# Patient Record
Sex: Male | Born: 1950
Health system: Southern US, Community
[De-identification: ages and names within clinical notes are randomized; demographics above are authoritative.]

## PROBLEM LIST (undated history)

## (undated) DIAGNOSIS — D126 Benign neoplasm of colon, unspecified: Secondary | ICD-10-CM

## (undated) DIAGNOSIS — D696 Thrombocytopenia, unspecified: Secondary | ICD-10-CM

## (undated) DIAGNOSIS — I10 Essential (primary) hypertension: Secondary | ICD-10-CM

## (undated) DIAGNOSIS — K219 Gastro-esophageal reflux disease without esophagitis: Secondary | ICD-10-CM

## (undated) DIAGNOSIS — C61 Malignant neoplasm of prostate: Secondary | ICD-10-CM

## (undated) DIAGNOSIS — K649 Unspecified hemorrhoids: Secondary | ICD-10-CM

## (undated) DIAGNOSIS — D649 Anemia, unspecified: Secondary | ICD-10-CM

## (undated) DIAGNOSIS — E039 Hypothyroidism, unspecified: Secondary | ICD-10-CM

## (undated) DIAGNOSIS — N400 Enlarged prostate without lower urinary tract symptoms: Secondary | ICD-10-CM

## (undated) DIAGNOSIS — K760 Fatty (change of) liver, not elsewhere classified: Secondary | ICD-10-CM

## (undated) DIAGNOSIS — E041 Nontoxic single thyroid nodule: Secondary | ICD-10-CM

## (undated) DIAGNOSIS — K589 Irritable bowel syndrome without diarrhea: Secondary | ICD-10-CM

## (undated) DIAGNOSIS — R011 Cardiac murmur, unspecified: Secondary | ICD-10-CM

## (undated) DIAGNOSIS — F32A Depression, unspecified: Secondary | ICD-10-CM

## (undated) DIAGNOSIS — M1990 Unspecified osteoarthritis, unspecified site: Secondary | ICD-10-CM

## (undated) DIAGNOSIS — E785 Hyperlipidemia, unspecified: Secondary | ICD-10-CM

## (undated) DIAGNOSIS — F329 Major depressive disorder, single episode, unspecified: Secondary | ICD-10-CM

## (undated) DIAGNOSIS — I868 Varicose veins of other specified sites: Secondary | ICD-10-CM

## (undated) DIAGNOSIS — F419 Anxiety disorder, unspecified: Secondary | ICD-10-CM

## (undated) HISTORY — DX: Essential (primary) hypertension: I10

## (undated) HISTORY — PX: PROSTATECTOMY: SHX69

## (undated) HISTORY — DX: Unspecified osteoarthritis, unspecified site: M19.90

## (undated) HISTORY — DX: Unspecified hemorrhoids: K64.9

## (undated) HISTORY — DX: Major depressive disorder, single episode, unspecified: F32.9

## (undated) HISTORY — DX: Irritable bowel syndrome, unspecified: K58.9

## (undated) HISTORY — DX: Varicose veins of other specified sites: I86.8

## (undated) HISTORY — DX: Hypothyroidism, unspecified: E03.9

## (undated) HISTORY — PX: COLONOSCOPY: SHX174

## (undated) HISTORY — PX: TONSILLECTOMY: SUR1361

## (undated) HISTORY — DX: Hyperlipidemia, unspecified: E78.5

## (undated) HISTORY — DX: Fatty (change of) liver, not elsewhere classified: K76.0

## (undated) HISTORY — DX: Depression, unspecified: F32.A

## (undated) HISTORY — DX: Anemia, unspecified: D64.9

## (undated) HISTORY — PX: CARDIAC CATHETERIZATION: SHX172

## (undated) HISTORY — DX: Malignant neoplasm of prostate: C61

## (undated) HISTORY — DX: Anxiety disorder, unspecified: F41.9

## (undated) HISTORY — DX: Benign neoplasm of colon, unspecified: D12.6

## (undated) HISTORY — DX: Benign prostatic hyperplasia without lower urinary tract symptoms: N40.0

## (undated) HISTORY — DX: Thrombocytopenia, unspecified: D69.6

---

## 2003-04-26 ENCOUNTER — Encounter: Admission: RE | Admit: 2003-04-26 | Discharge: 2003-04-26 | Payer: Self-pay | Admitting: Internal Medicine

## 2004-02-01 ENCOUNTER — Ambulatory Visit: Payer: Self-pay | Admitting: Gastroenterology

## 2004-02-02 DIAGNOSIS — D126 Benign neoplasm of colon, unspecified: Secondary | ICD-10-CM

## 2004-02-02 HISTORY — DX: Benign neoplasm of colon, unspecified: D12.6

## 2004-02-08 ENCOUNTER — Ambulatory Visit: Payer: Self-pay | Admitting: Gastroenterology

## 2004-02-10 ENCOUNTER — Ambulatory Visit (HOSPITAL_COMMUNITY): Admission: RE | Admit: 2004-02-10 | Discharge: 2004-02-10 | Payer: Self-pay | Admitting: Gastroenterology

## 2004-02-16 ENCOUNTER — Ambulatory Visit: Payer: Self-pay | Admitting: Gastroenterology

## 2004-03-07 ENCOUNTER — Encounter: Admission: RE | Admit: 2004-03-07 | Discharge: 2004-03-07 | Payer: Self-pay | Admitting: Gastroenterology

## 2004-03-26 ENCOUNTER — Ambulatory Visit (HOSPITAL_COMMUNITY): Admission: RE | Admit: 2004-03-26 | Discharge: 2004-03-26 | Payer: Self-pay | Admitting: Gastroenterology

## 2004-03-28 ENCOUNTER — Ambulatory Visit (HOSPITAL_COMMUNITY): Admission: RE | Admit: 2004-03-28 | Discharge: 2004-03-28 | Payer: Self-pay | Admitting: Gastroenterology

## 2004-04-10 ENCOUNTER — Ambulatory Visit: Payer: Self-pay | Admitting: Gastroenterology

## 2006-01-10 ENCOUNTER — Encounter: Admission: RE | Admit: 2006-01-10 | Discharge: 2006-01-10 | Payer: Self-pay | Admitting: Internal Medicine

## 2007-12-01 ENCOUNTER — Ambulatory Visit: Payer: Self-pay | Admitting: Gastroenterology

## 2007-12-10 ENCOUNTER — Ambulatory Visit: Payer: Self-pay | Admitting: Gastroenterology

## 2007-12-10 ENCOUNTER — Encounter: Payer: Self-pay | Admitting: Gastroenterology

## 2007-12-13 ENCOUNTER — Encounter: Payer: Self-pay | Admitting: Gastroenterology

## 2007-12-25 DIAGNOSIS — Z860100 Personal history of colon polyps, unspecified: Secondary | ICD-10-CM | POA: Insufficient documentation

## 2007-12-30 ENCOUNTER — Ambulatory Visit: Payer: Self-pay | Admitting: Gastroenterology

## 2007-12-30 DIAGNOSIS — I868 Varicose veins of other specified sites: Secondary | ICD-10-CM

## 2008-06-01 ENCOUNTER — Encounter: Payer: Self-pay | Admitting: Gastroenterology

## 2008-06-28 ENCOUNTER — Encounter: Payer: Self-pay | Admitting: Gastroenterology

## 2008-07-12 ENCOUNTER — Encounter: Payer: Self-pay | Admitting: Gastroenterology

## 2008-07-13 ENCOUNTER — Ambulatory Visit: Admission: RE | Admit: 2008-07-13 | Discharge: 2008-09-30 | Payer: Self-pay | Admitting: Radiation Oncology

## 2008-10-12 ENCOUNTER — Inpatient Hospital Stay (HOSPITAL_COMMUNITY): Admission: RE | Admit: 2008-10-12 | Discharge: 2008-10-14 | Payer: Self-pay | Admitting: Urology

## 2008-10-12 ENCOUNTER — Encounter (INDEPENDENT_AMBULATORY_CARE_PROVIDER_SITE_OTHER): Payer: Self-pay | Admitting: Urology

## 2009-03-04 DIAGNOSIS — C61 Malignant neoplasm of prostate: Secondary | ICD-10-CM

## 2009-03-04 HISTORY — DX: Malignant neoplasm of prostate: C61

## 2009-11-02 ENCOUNTER — Encounter (INDEPENDENT_AMBULATORY_CARE_PROVIDER_SITE_OTHER): Payer: Self-pay | Admitting: *Deleted

## 2009-11-15 ENCOUNTER — Encounter (INDEPENDENT_AMBULATORY_CARE_PROVIDER_SITE_OTHER): Payer: Self-pay | Admitting: *Deleted

## 2009-12-20 ENCOUNTER — Encounter (INDEPENDENT_AMBULATORY_CARE_PROVIDER_SITE_OTHER): Payer: Self-pay | Admitting: *Deleted

## 2009-12-21 ENCOUNTER — Ambulatory Visit: Payer: Self-pay | Admitting: Gastroenterology

## 2010-01-04 ENCOUNTER — Ambulatory Visit: Payer: Self-pay | Admitting: Gastroenterology

## 2010-01-05 ENCOUNTER — Encounter: Payer: Self-pay | Admitting: Gastroenterology

## 2010-04-03 NOTE — Letter (Signed)
Summary: Pre Visit Letter Revised  [facility]  [address] [place], [place] [address]   Phone: [phone]  Fax: [phone]        11/15/2009 MRN: [id number]   Tyler [name] [address] [place], [place]  [address]                Welcome to the Gastroenterology Division at [facility].    You are scheduled to see a nurse for your pre-procedure visit on December 21, 2009 at 8:30am on the 3rd floor at [facility], [address]  We ask that you try to arrive at our office 15 minutes prior to your appointment time to allow for check-in.  Please take a minute to review the attached form.  If you answer "Yes" to one or more of the questions on the first page, we ask that you call the person listed at your earliest opportunity.  If you answer "No" to all of the questions, please complete the rest of the form and bring it to your appointment.    Your nurse visit will consist of discussing your medical and surgical history, your immediate family medical history, and your medications.   If you are unable to list all of your medications on the form, please bring the medication bottles to your appointment and we will list them.  We will need to be aware of both prescribed and over the counter drugs.  We will need to know exact dosage information as well.    Please be prepared to read and sign documents such as consent forms, a financial agreement, and acknowledgement forms.  If necessary, and with your consent, a friend or relative is welcome to sit-in on the nurse visit with you.  Please bring your insurance card so that we may make a copy of it.  If your insurance requires a referral to see a specialist, please bring your referral form from your primary care physician.  No co-pay is required for this nurse visit.     If you cannot keep your appointment, please call [phone] to cancel or reschedule prior to your appointment date.  This allows [place] the  opportunity to schedule an appointment for another patient in need of care.    Thank you for choosing [facility] for your medical needs.  We appreciate the opportunity to care for you.  Please visit [place] at our website  to learn more about our practice.  Sincerely, The Gastroenterology Division

## 2010-04-03 NOTE — Letter (Signed)
Summary: [facility] Instructions  [facility]  [address] [place], [place] [address]   Phone: [phone]  Fax: [phone]       Tyler [name]    [birth year]    MRN: [id number]        Procedure Day Dorna Bloom:  Lenor Coffin  01/04/10     Arrival Time:  9:30AM     Procedure Time:  10:30AM     Location of Procedure:                    _X _  [facility] (4th Floor)                      PREPARATION FOR COLONOSCOPY WITH MOVIPREP   Starting 5 days prior to your procedure 12/30/09 do not eat nuts, seeds, popcorn, corn, beans, peas,  salads, or any raw vegetables.  Do not take any fiber supplements (e.g. Metamucil, Citrucel, and Benefiber).  THE DAY BEFORE YOUR PROCEDURE         DATE: 01/03/10  DAY: WEDNESDAY  1.  Drink clear liquids the entire day-NO SOLID FOOD  2.  Do not drink anything colored red or purple.  Avoid juices with pulp.  No orange juice.  3.  Drink at least 64 oz. (8 glasses) of fluid/clear liquids during the day to prevent dehydration and help the prep work efficiently.  CLEAR LIQUIDS INCLUDE: Water Jello Ice Popsicles Tea (sugar ok, no milk/cream) Powdered fruit flavored drinks Coffee (sugar ok, no milk/cream) Gatorade Juice: apple, white grape, white cranberry  Lemonade Clear bullion, consomm, broth Carbonated beverages (any kind) Strained chicken noodle soup Hard Candy                             4.  In the morning, mix first dose of MoviPrep solution:    Empty 1 Pouch A and 1 Pouch B into the disposable container    Add lukewarm drinking water to the top line of the container. Mix to dissolve    Refrigerate (mixed solution should be used within 24 hrs)  5.  Begin drinking the prep at 5:00 p.m. The MoviPrep container is divided by 4 marks.   Every 15 minutes drink the solution down to the next mark (approximately 8 oz) until the full liter is complete.   6.  Follow completed prep with 16 oz of clear liquid of your choice  (Nothing red or purple).  Continue to drink clear liquids until bedtime.  7.  Before going to bed, mix second dose of MoviPrep solution:    Empty 1 Pouch A and 1 Pouch B into the disposable container    Add lukewarm drinking water to the top line of the container. Mix to dissolve    Refrigerate  THE DAY OF YOUR PROCEDURE      DATE: 01/04/10  DAY: THURSDAY  Beginning at 5:30AM (5 hours before procedure):         1. Every 15 minutes, drink the solution down to the next mark (approx 8 oz) until the full liter is complete.  2. Follow completed prep with 16 oz. of clear liquid of your choice.    3. You may drink clear liquids until 8:30AM (2 HOURS BEFORE PROCEDURE).   MEDICATION INSTRUCTIONS  Unless otherwise instructed, you should take regular prescription medications with a small sip of water   as early as possible the morning of your  procedure.       OTHER INSTRUCTIONS  You will need a responsible adult at least 62 years of age to accompany you and drive you home.   This person must remain in the waiting room during your procedure.  Wear loose fitting clothing that is easily removed.  Leave jewelry and other valuables at home.  However, you may wish to bring a book to read or  an iPod/MP3 player to listen to music as you wait for your procedure to start.  Remove all body piercing jewelry and leave at home.  Total time from sign-in until discharge is approximately 2-3 hours.  You should go home directly after your procedure and rest.  You can resume normal activities the  day after your procedure.  The day of your procedure you should not:   Drive   Make legal decisions   Operate machinery   Drink alcohol   Return to work  You will receive specific instructions about eating, activities and medications before you leave.    The above instructions have been reviewed and explained to me by   Tyler Almas RN  December 21, 2009 8:51 AM     I fully understand  and can verbalize these instructions _____________________________ Date _________

## 2010-04-03 NOTE — Letter (Signed)
Summary: Patient Notice- Polyp Results  [facility]  [address] [place], [place] [address]   Phone: [phone]  Fax: [phone]        January 05, 2010 MRN: [id number]    NEFTALI ABAIR [address] [place], [place]  [address]    Dear Mr. LIDDY,  I am pleased to inform you that the colon polyp(s) removed during your recent colonoscopy was (were) found to be benign (no cancer detected) upon pathologic examination.  I recommend you have a repeat colonoscopy examination in 3 years to look for recurrent polyps, as having colon polyps increases your risk for having recurrent polyps or even colon cancer in the future.  Should you develop new or worsening symptoms of abdominal pain, bowel habit changes or bleeding from the rectum or bowels, please schedule an evaluation with either your primary care physician or with me.  Continue treatment plan as outlined the day of your exam.  Please call us if you are having persistent problems or have questions about your condition that have not been fully answered at this time.  Sincerely,  Meryl Dare MD [facility]  This letter has been electronically signed by your physician.  Appended Document: Patient Notice- Polyp Results letter mailed

## 2010-04-03 NOTE — Letter (Signed)
Summary: Colonoscopy Letter  [facility]  [address] [place], [place] [address]   Phone: [phone]  Fax: [phone]      November 02, 2009 MRN: [id number]   Tyler [name] [address] [place], [place]  [address]   Dear [name],   According to your medical record, it is time for you to schedule a Colonoscopy. The American Cancer Society recommends this procedure as a method to detect early colon cancer. Patients with a family history of colon cancer, or a personal history of colon polyps or inflammatory bowel disease are at increased risk.  This letter has beeen generated based on the recommendations made at the time of your procedure. If you feel that in your particular situation this may no longer apply, please contact our office.  Please call our office at [phone] to schedule this appointment or to update your records at your earliest convenience.  Thank you for cooperating with [place] to provide you with the very best care possible.   Sincerely,  Judie Petit T. Russella Dar, M.D.  [facility] Gastroenterology Division [phone]

## 2010-04-03 NOTE — Miscellaneous (Signed)
Summary: [facility] Previsit/prep  Clinical Lists Changes  Medications: Added new medication of MOVIPREP 100 GM  SOLR (PEG-KCL-NACL-NASULF-NA ASC-C) As per prep instructions. - Signed Rx of MOVIPREP 100 GM  SOLR (PEG-KCL-NACL-NASULF-NA ASC-C) As per prep instructions.;  #1 x 0;  Signed;  Entered by: Wyona Almas RN;  Authorized by: Meryl Dare MD [facility];  Method used: Electronically to [facility]  [phone]*, [address] [place], [place]  [address], Ph: [phone] or [phone], Fax: [phone] Observations: Added new observation of NKA: T (12/21/2009 8:22)    Prescriptions: MOVIPREP 100 GM  SOLR (PEG-KCL-NACL-NASULF-NA ASC-C) As per prep instructions.  #1 x 0   Entered by:   Wyona Almas RN   Authorized by:   Meryl Dare MD [facility]   Signed by:   Wyona Almas RN on 12/21/2009   Method used:   Electronically to        [facility]  [phone][facility] (retail)       [address] [place], [place]  [address]       Ph: [phone] or [phone]       Fax: [phone]   RxID:   [id number]

## 2010-04-03 NOTE — Procedures (Signed)
Summary: Colonoscopy  Patient: [name] Note: All result statuses are Final unless otherwise noted.  Tests: (1) Colonoscopy (COL)   COL Colonoscopy           DONE     [facility]     [address]     [place], [place]  [address]           COLONOSCOPY PROCEDURE REPORT           PATIENT:  Tyler [name], Tyler [name]  MR#:  [id number]     BIRTHDATE:  [birth year], 59 yrs. old  GENDER:  male     ENDOSCOPIST:  Judie Petit T. Russella Dar, MD, [facility]           PROCEDURE DATE:  01/04/2010     PROCEDURE:  Colonoscopy with biopsy and snare polypectomy     ASA CLASS:  Class II     INDICATIONS:  1) surveillance and high-risk screening  2) history     of adenomatous colon polyps: 02/2004, 12/2007.     MEDICATIONS:   Fentanyl 50 mcg IV, Versed 6 mg IV     DESCRIPTION OF PROCEDURE:   After the risks benefits and     alternatives of the procedure were thoroughly explained, informed     consent was obtained.  Digital rectal exam was performed and     revealed no abnormalities.   The LB PCF-H180AL [id number] endoscope     was introduced through the anus and advanced to the cecum, which     was identified by both the appendix and ileocecal valve, extensive     colonic varices limited the visualization in several areas.  The     quality of the prep was good, using MoviPrep.  The instrument was     then slowly withdrawn as the colon was fully examined.     <<PROCEDUREIMAGES>>     FINDINGS:  There were varices identified. throughout the colon.  A     sessile polyp was found in the descending colon. It was 5 mm in     size. Polyp was snared without cautery. Retrieval was successful.     A sessile polyp was found in the descending colon. It was 4 mm in     size. The polyp was removed using cold biopsy forceps.  A sessile     polyp was found in the sigmoid colon. It was 4 mm in size. The     polyp was removed using cold biopsy forceps.  This was otherwise a     normal examination of the colon.   Retroflexed views in the  rectum     revealed no abnormalities. The time to cecum =  5  minutes. The     scope was then withdrawn (time =  13  min) from the patient and     the procedure completed.           COMPLICATIONS:  None           ENDOSCOPIC IMPRESSION:     1) Varices, throughout the colon     2) 5 mm sessile polyp in the descending colon     3) 4 mm sessile polyp in the descending colon     4) 4 mm sessile polyp in the sigmoid colon           RECOMMENDATIONS:     1) Hold aspirin, aspirin products, and anti-inflamatory     medication for 2 weeks.     2) Await  pathology results     3) Repeat Colonoscopy in 3 years.           Venita Lick. Russella Dar, MD, [facility]           CC: Chilton Greathouse, MD           n.     Rosalie DoctorVenita Lick. Stark at 01/04/2010 11:02 AM           Jerelene Redden, [phone]  Note: An exclamation mark (!) indicates a result that was not dispersed into the flowsheet. Document Creation Date: 01/04/2010 11:03 AM _______________________________________________________________________  (1) Order result status: Final Collection or observation date-time: 01/04/2010 10:55 Requested date-time:  Receipt date-time:  Reported date-time:  Referring Physician:   Ordering Physician: Claudette Head [phone]) Specimen Source:  Source: Launa Grill Order Number: [id number] Lab site:   Appended Document: Colonoscopy     Procedures Next Due Date:    Colonoscopy: 01/2013

## 2010-06-09 LAB — CBC
HCT: 29.9 % — ABNORMAL LOW (ref 39.0–52.0)
HCT: 31.7 % — ABNORMAL LOW (ref 39.0–52.0)
HCT: 39.7 % (ref 39.0–52.0)
Hemoglobin: 10.4 g/dL — ABNORMAL LOW (ref 13.0–17.0)
Hemoglobin: 13.1 g/dL (ref 13.0–17.0)
Hemoglobin: 9.9 g/dL — ABNORMAL LOW (ref 13.0–17.0)
MCHC: 32.9 g/dL (ref 30.0–36.0)
MCHC: 33 g/dL (ref 30.0–36.0)
MCHC: 33 g/dL (ref 30.0–36.0)
MCV: 89 fL (ref 78.0–100.0)
MCV: 89.8 fL (ref 78.0–100.0)
MCV: 90.5 fL (ref 78.0–100.0)
Platelets: 114 10*3/uL — ABNORMAL LOW (ref 150–400)
Platelets: 115 10*3/uL — ABNORMAL LOW (ref 150–400)
Platelets: 92 10*3/uL — ABNORMAL LOW (ref 150–400)
RBC: 3.33 MIL/uL — ABNORMAL LOW (ref 4.22–5.81)
RBC: 3.5 MIL/uL — ABNORMAL LOW (ref 4.22–5.81)
RBC: 4.45 MIL/uL (ref 4.22–5.81)
RDW: 13.3 % (ref 11.5–15.5)
RDW: 13.6 % (ref 11.5–15.5)
RDW: 13.6 % (ref 11.5–15.5)
WBC: 4.2 10*3/uL (ref 4.0–10.5)
WBC: 4.9 10*3/uL (ref 4.0–10.5)
WBC: 7.1 10*3/uL (ref 4.0–10.5)

## 2010-06-09 LAB — BASIC METABOLIC PANEL
BUN: 10 mg/dL (ref 6–23)
BUN: 5 mg/dL — ABNORMAL LOW (ref 6–23)
CO2: 28 mEq/L (ref 19–32)
CO2: 31 mEq/L (ref 19–32)
Calcium: 8.3 mg/dL — ABNORMAL LOW (ref 8.4–10.5)
Calcium: 9.3 mg/dL (ref 8.4–10.5)
Chloride: 105 mEq/L (ref 96–112)
Chloride: 110 mEq/L (ref 96–112)
Creatinine, Ser: 0.75 mg/dL (ref 0.4–1.5)
Creatinine, Ser: 0.85 mg/dL (ref 0.4–1.5)
GFR calc Af Amer: >60 mL/min (ref >60)
GFR calc Af Amer: >60 mL/min (ref >60)
GFR calc non Af Amer: >60 mL/min (ref >60)
GFR calc non Af Amer: >60 mL/min (ref >60)
Glucose, Bld: 64 mg/dL — ABNORMAL LOW (ref 70–99)
Glucose, Bld: 97 mg/dL (ref 70–99)
Potassium: 4.2 mEq/L (ref 3.5–5.1)
Potassium: 4.4 mEq/L (ref 3.5–5.1)
Sodium: 141 mEq/L (ref 135–145)
Sodium: 142 mEq/L (ref 135–145)

## 2010-06-09 LAB — HEMOGLOBIN AND HEMATOCRIT, BLOOD
HCT: 30.8 % — ABNORMAL LOW (ref 39.0–52.0)
HCT: 36.3 % — ABNORMAL LOW (ref 39.0–52.0)
Hemoglobin: 10.2 g/dL — ABNORMAL LOW (ref 13.0–17.0)
Hemoglobin: 11.6 g/dL — ABNORMAL LOW (ref 13.0–17.0)

## 2010-06-09 LAB — ABO/RH: ABO/RH(D): A NEG

## 2010-06-09 LAB — TYPE AND SCREEN
ABO/RH(D): A NEG
Antibody Screen: NEGATIVE

## 2010-07-17 NOTE — Discharge Summary (Signed)
Tyler [name], Tyler [name]                 ACCOUNT NO.:  [id number]   MEDICAL RECORD NO.:  [id number]          PATIENT TYPE:  INP   LOCATION:  1427                         FACILITY:  [facility]   PHYSICIAN:  Valetta Fuller, M.D.  DATE OF BIRTH:  12/26/1950   DATE OF ADMISSION:  10/12/2008  DATE OF DISCHARGE:  10/14/2008                               DISCHARGE SUMMARY   ADMISSION DIAGNOSIS:  Clinical stage T1c adenocarcinoma of the prostate.   DISCHARGE DIAGNOSES:  1. Clinical stage T1c adenocarcinoma of the prostate.  2. Acute versus chronic anemia with low platelet count.   PROCEDURE:  Robotic-assisted laparoscopic radical prostatectomy.   HISTORY AND PHYSICAL:  For full details, please see admission history  and physical.  Briefly, Tyler [name] is a 60 year old gentleman who was  found to have clinically localized adenocarcinoma of the prostate.  After careful consideration regarding management options for treatment,  he elected to proceed with surgical therapy and a robotic assisted  laparoscopic radical prostatectomy.   HOSPITAL COURSE:  On October 12, 2008, he was taken to the operating room  and underwent the above-named procedure in which he tolerated well and  without complications.  Postoperatively, he was able to be transferred  to a regular hospital room following recovery from anesthesia.  He was  able to begin ambulation that evening.  He remained hemodynamically  stable.  His postoperative hematocrit was 36.3.  On the morning of  postoperative day #1, his hematocrit was slightly decreased although  still found to be stable at 30.8.  Because of the variance, his  hematocrit was rechecked later on the afternoon of postoperative day #1  and found to be stable at 31.7.  Suggesting that the variance was  delusional.  He maintained excellent urine output with minimal output  from his pelvic drain; therefore, the pelvic drain was removed on  postoperative day #1.  He was placed on a  clear liquid diet and  continued to ambulate.  His urine was clear and yellow postoperatively  and found to be blood-tinged without clot sediment on postoperative day  #1.  Therefore, his fluids were left in place to help to clear his  urine.  On postoperative day #1, his lab work did show that his platelet  count was 114, although preoperatively it was 115.  Therefore, it was  decided that his Toradol medication would be stopped.  His Foley  catheter was irrigated with normal saline and several small clots were  removed and his urine did dramatically clear.  Although his urine was  beginning to clear, he was complaining of pain; therefore, it was  decided that he would stay for 1 more night for observation and pain  control.  On the morning of postoperative day #2, his pain was well  controlled and he was tolerating his clear liquid diet.  He urine was  beginning to clear and upon discharge was clear and yellow.  His lab  work was re-drawn and his hemoglobin was found to be 9.9 and platelet  count had slightly decreased to 92.  It was decided  that he was ready  for discharge as he met all discharge criteria, although he was to come  back to the office in 4 days to have more blood work done to recheck his  hemoglobin, hematocrit and platelet count.   DISPOSITION:  Home.   DISCHARGE MEDICATIONS:  He was instructed to resume his regular home  medications including ramipril and Cymbalta.  He was instructed to hold  all aspirin, herbal supplements and multivitamins for 7-10 days until  his Foley catheter was removed.  In addition, he was provided a  prescription for Vicodin to use as needed for pain and told to use  Colace as a stool softener.  He was also given a prescription for Cipro  to begin 2 days prior to return appointment for removal of Foley  catheter.   DISCHARGE INSTRUCTIONS:  He was instructed to be ambulatory, but  specifically told to refrain from any heavy lifting,  strenuous activity  or driving.  He was instructed on routine Foley catheter care and told  to gradually advance his diet over the course of the next few days.   FOLLOW UP:  He will follow up in 4 days for lab work to recheck his  hemoglobin, hematocrit and platelet count.  He will also follow up in 7-  10 days with Dr. Isabel Caprice for removal of Foley catheter and skin staples.      Delia Chimes, NP      Valetta Fuller, M.D.  Electronically Signed    MA/MEDQ  D:  10/14/2008  T:  10/14/2008  Job:  [id number]

## 2010-07-17 NOTE — Op Note (Signed)
Tyler [name], Tyler [name]                 ACCOUNT NO.:  [id number]   MEDICAL RECORD NO.:  [id number]          PATIENT TYPE:  INP   LOCATION:  1427                         FACILITY:  [facility]   PHYSICIAN:  Valetta Fuller, M.D.  DATE OF BIRTH:  [birth year]   DATE OF PROCEDURE:  10/13/2008  DATE OF DISCHARGE:                               OPERATIVE REPORT   PREOPERATIVE DIAGNOSIS:  Clinical stage T1C adenocarcinoma of the  prostate.   POSTOPERATIVE DIAGNOSIS:  Clinical stage T1C adenocarcinoma of the  prostate.   PROCEDURE PERFORMED:  Robotic assisted laparoscopic radical retropubic  prostatectomy.   SURGEON:  Valetta Fuller, M.D.   ASSISTANT:  Delia Chimes, nurse practitioner.   ANESTHESIA:  General endotracheal   INDICATIONS:  Tyler [name] is a 60 year old male.  The patient was  diagnosed with adenocarcinoma prostate by Dr. Marcine [name].  He  appeared to have favorable clinical stage T1 C disease.  PSA was in the  4-5 range.  The patient had very low-volume Gleason 6 cancer.  The  patient underwent extensive counseling by Dr. Retta [name] as well as  myself.  After discussion he elects to proceed with a surgical approach.  He appeared to understand the advantages and disadvantages of this  treatment option.  The patient has done preoperative physical therapy.  He presents now for the surgery.  He has completed a mechanical bowel  prep and has received perioperative Unasyn.   TECHNIQUE AND FINDINGS:  The patient was brought to the operating room.  He was placed supine on the operative table with careful padding of all  extremities.  He had successful induction of general anesthesia.  He was  placed in a mid lithotomy position with careful padding.  The patient  was secured to the operative table and then placed in a steep  Trendelenburg position.  He was prepped and draped in the usual manner.  Foley catheter was placed sterilely on the field.   Camera port incision was chosen 18  cm above the pubic symphysis just to  the left of the umbilicus.  Open Hasson technique was utilized.  There  was no evidence of any adhesions and insufflation occurred without  incident.  Additional trocars were placed with direct visual guidance  including 12 mm of 5 mm assist trocars and three 8 mm robotic trocars.  Once all trocars were placed a surgical cart was docked.  The bladder  was filled to aid the dissection of the retropubic space which was done  with blunt dissection and electrocautery scissors.  Superficial fat over  the bladder neck and endopelvic fascia was taken to expose those  structures.  Endopelvic fascia was then incised from base to apex of the  prostate.  Levator musculature was swept off the apex of the prostate  isolating the dorsal venous complex which was then stapled with  excellent hemostasis.  Anterior bladder neck was identified with the aid  of the Foley balloon and then transected with electrocautery scissors.  Indigo carmine was given and we were well away from the ureteral  orifices.  Posterior bladder  neck was then transected and the vas  deferens and seminal vesicles were each individually dissected free with  clips utilized near the tips of the seminal vesicles.  With these  structures held anteriorly, the posterior plane between the prostate and  rectum was developed primarily with blunt dissection.   A bilateral nerve spare was performed.  Superficial fascia over the  prostate was incised anteriorly and swept posterior laterally isolating  a nice neurovascular bundle on each side.  With the neurovascular bundle  isolated, the pedicles of the prostate were taken down with locking  clips bilaterally.  The apex was then transected and after incising some  rectourethralis fibers, the prostate was removed from the pelvis.  Copious irrigation was performed and there was no evidence of any rectal  injury.  We did not feel that pelvic lymph node  dissection was indicated  in this situation.   Reconstruction was performed.  Denonvilliers fascia was reapproximated  from underneath the urethra to underneath the bladder with a Vicryl  suture.  The 6 o'clock urethral stump and 6 o'clock bladder neck were  then reapproximated with a single 2-0 Vicryl suture.  With those  structures in continuity, we then completed the anastomosis with a  double-armed 3-0 Monocryl suture and in a 360 degrees running manner.  New catheter was placed without incident and irrigation showed no  evidence of leakage or extravasation of irrigant.  A pelvic drain was  placed through one of the trocars and secured to the skin in the  retropubic space.  The prostate specimen was placed an Endopouch  retrieval bag.  The 12 mm trocar site was closed with Vicryl suture with  the aid of a suture passer.  All other trocars were taken down with  visual guidance.  All trocar sites were infiltrated with Marcaine and  closed with clips.  The camera port incision was extended to allow for  removal of the specimen and that fascia was then closed with a running  Vicryl suture and clips for the skin.  Catheter drained a clear blue  urine at the completion of the procedure and blood loss was minimal.  The patient appeared to tolerate the procedure well was brought to  recovery room in stable condition.      Valetta Fuller, M.D.  Electronically Signed     DSG/MEDQ  D:  10/13/2008  T:  10/13/2008  Job:  [id number]

## 2012-02-20 ENCOUNTER — Encounter: Payer: Self-pay | Admitting: Gastroenterology

## 2012-03-16 ENCOUNTER — Encounter: Payer: Self-pay | Admitting: Gastroenterology

## 2012-03-16 ENCOUNTER — Ambulatory Visit (INDEPENDENT_AMBULATORY_CARE_PROVIDER_SITE_OTHER): Payer: PRIVATE HEALTH INSURANCE | Admitting: Gastroenterology

## 2012-03-16 VITALS — BP 128/64 | HR 86 | Ht 69.0 in | Wt 172.0 lb

## 2012-03-16 DIAGNOSIS — I868 Varicose veins of other specified sites: Secondary | ICD-10-CM

## 2012-03-16 DIAGNOSIS — R195 Other fecal abnormalities: Secondary | ICD-10-CM

## 2012-03-16 DIAGNOSIS — Z860100 Personal history of colon polyps, unspecified: Secondary | ICD-10-CM

## 2012-03-16 MED ORDER — PEG-KCL-NACL-NASULF-NA ASC-C 100 G PO SOLR: 1.0000 | Freq: Once | ORAL | Status: DC

## 2012-03-16 NOTE — Progress Notes (Signed)
History of Present Illness: This is a 62 year old male with a history of adenomatous colon polyps and colonic varices of unclear etiology. He was recently found to have Hemoccult positive stool. His last colonoscopy was in June 2011. Prior colonoscopy June 2009.  Current Medications, Allergies, Past Medical History, Past Surgical History, Family History and Social History were reviewed in [employer] record.  Physical Exam: General: Well developed , well nourished, no acute distress Head: Normocephalic and atraumatic Eyes:  sclerae anicteric, EOMI Ears: Normal auditory acuity Mouth: No deformity or lesions Lungs: Clear throughout to auscultation Heart: Regular rate and rhythm; no murmurs, rubs or bruits Abdomen: Soft, non tender and non distended. No masses, hepatosplenomegaly or hernias noted. Normal Bowel sounds Rectal: Deferred to colonoscopy Musculoskeletal: Symmetrical with no gross deformities  Pulses:  Normal pulses noted Extremities: No clubbing, cyanosis, edema or deformities noted Neurological: Alert oriented x 4, grossly nonfocal Psychological:  Alert and cooperative. Normal mood and affect  Assessment and Recommendations:  1. Asymptomatic Hemoccult-positive stool in a patient with known colonic varices. Personal history of adenomatous colon polyps. Schedule colonoscopy. The risks, benefits, and alternatives to colonoscopy with possible biopsy and possible polypectomy were discussed with the patient and they consent to proceed. In this patient, routine screening Hemoccults probably have little value given known colonic varices and his ongoing participation in colon polyp surveillance program with colonoscopies every 3 years.

## 2012-03-16 NOTE — Patient Instructions (Addendum)
You have been scheduled for a colonoscopy with propofol. Please follow written instructions given to you at your visit today.  Please pick up your prep kit at the pharmacy within the next 1-3 days. If you use inhalers (even only as needed) or a CPAP machine, please bring them with you on the day of your procedure.  

## 2012-03-20 ENCOUNTER — Ambulatory Visit (AMBULATORY_SURGERY_CENTER): Payer: PRIVATE HEALTH INSURANCE | Admitting: Gastroenterology

## 2012-03-20 ENCOUNTER — Encounter: Payer: Self-pay | Admitting: Gastroenterology

## 2012-03-20 VITALS — BP 116/78 | HR 63 | Temp 98.2°F | Resp 15 | Ht 69.0 in | Wt 172.0 lb

## 2012-03-20 DIAGNOSIS — D126 Benign neoplasm of colon, unspecified: Secondary | ICD-10-CM

## 2012-03-20 DIAGNOSIS — I868 Varicose veins of other specified sites: Secondary | ICD-10-CM

## 2012-03-20 DIAGNOSIS — R195 Other fecal abnormalities: Secondary | ICD-10-CM

## 2012-03-20 DIAGNOSIS — Z86010 Personal history of colonic polyps: Secondary | ICD-10-CM

## 2012-03-20 MED ORDER — SODIUM CHLORIDE 0.9 % IV SOLN: 500.0000 mL | INTRAVENOUS | Status: DC

## 2012-03-20 NOTE — Op Note (Signed)
[facility] [address] [place], [address]   COLONOSCOPY PROCEDURE REPORT PATIENT: Tyler [name], Tyler [name]  MR#: [id number] BIRTHDATE: 02-15-1951 , 61  yrs. old GENDER: Male ENDOSCOPIST: Meryl Dare, MD, [facility] PROCEDURE DATE:  03/20/2012 PROCEDURE:   Colonoscopy with snare polypectomy ASA CLASS:   Class III INDICATIONS:Patient's personal history of adenomatous colon polyps and heme-positive stool. MEDICATIONS: MAC sedation, administered by CRNA and propofol (Diprivan) 260mg  IV DESCRIPTION OF PROCEDURE:   After the risks benefits and alternatives of the procedure were thoroughly explained, informed consent was obtained.  A digital rectal exam revealed no abnormalities of the rectum.   The LB [id number]  endoscope was introduced through the anus and advanced to the cecum, which was identified by both the appendix and ileocecal valve. No adverse events experienced with a tortuous colon.   The quality of the prep was good, using MoviPrep  The instrument was then slowly withdrawn as the colon was fully examined.  COLON FINDINGS: A single polyp measuring 5 mm in size was found in the proximal transverse colon.  A polypectomy was performed with a cold snare.  The resection was complete and the polyp tissue was completely retrieved.   Two polyps measuring 5-6 mm in size were found in the distal transverse colon.  A polypectomy was performed with a cold snare.  The resection was complete and the polyp tissue was completely retrieved.   A pedunculated polyp measuring 9 mm in size was found in the descending colon.  A polypectomy was performed using snare cautery.  The resection was complete and the polyp tissue was completely retrieved.  Slight oozing at the 3 sites with colon snare. Varices throughout colon.   The colon was otherwise normal.  There was no diverticulosis, inflammation, polyps or cancers unless previously stated.  Retroflexed views revealed moderate  internal hemorrhoids. The time to cecum=4 minutes 52 seconds.  Withdrawal time=16 minutes 43 seconds.  The scope was withdrawn and the procedure completed. COMPLICATIONS: There were no complications. ENDOSCOPIC IMPRESSION: 1.   Single polyp measuring 5 mm in the proximal transverse colon; polypectomy performed with a cold snare 2.   Two polyps measuring 5-6 mm in the distal transverse colon; polypectomy performed with a cold snare 3.   Pedunculated polyp measuring 9 mmin the descending colon; polypectomy performed using snare cautery 4.   Varices throughout colon 5.   Moderate internal hemorrhoids  RECOMMENDATIONS: 1.  Hold aspirin, aspirin products, and anti-inflammatory medication and no heavy lifting or stenusous activity for 3 weeks. 2.  Await pathology results 3.  Repeat Colonoscopy in 3 years.  eSigned:  Meryl Dare, MD, [facility] 03/20/2012 9:33 AM   cc: Chilton Greathouse, MD

## 2012-03-20 NOTE — Patient Instructions (Signed)
HOLD ASPIRIN ,ASPIRIN PRODUCTS AND ANTI INFLAMMATORY MEDICATION FOR 3 WEEKS.FEBUARY 4,0981. NO STRENUOUS ACTIVITY OR HEAVY LIFTING FOR 3 WEEKS, FEBRUARY 7,2013.  YOU HAD AN ENDOSCOPIC PROCEDURE TODAY AT THE [facility]: Refer to the procedure report that was given to you for any specific questions about what was found during the examination.  If the procedure report does not answer your questions, please call your gastroenterologist to clarify.  If you requested that your care partner not be given the details of your procedure findings, then the procedure report has been included in a sealed envelope for you to review at your convenience later.  YOU SHOULD EXPECT: Some feelings of bloating in the abdomen. Passage of more gas than usual.  Walking can help get rid of the air that was put into your GI tract during the procedure and reduce the bloating. If you had a lower endoscopy (such as a colonoscopy or flexible sigmoidoscopy) you may notice spotting of blood in your stool or on the toilet paper. If you underwent a bowel prep for your procedure, then you may not have a normal bowel movement for a few days.  DIET: Your first meal following the procedure should be a light meal and then it is ok to progress to your normal diet.  A half-sandwich or bowl of soup is an example of a good first meal.  Heavy or fried foods are harder to digest and may make you feel nauseous or bloated.  Likewise meals heavy in dairy and vegetables can cause extra gas to form and this can also increase the bloating.  Drink plenty of fluids but you should avoid alcoholic beverages for 24 hours.  ACTIVITY: Your care partner should take you home directly after the procedure.  You should plan to take it easy, moving slowly for the rest of the day.  You can resume normal activity the day after the procedure however you should NOT DRIVE or use heavy machinery for 24 hours (because of the sedation medicines used during the  test).    SYMPTOMS TO REPORT IMMEDIATELY: A gastroenterologist can be reached at any hour.  During normal business hours, 8:30 AM to 5:00 PM Monday through Friday, call [phone].  After hours and on weekends, please call the GI answering service at [phone] who will take a message and have the physician on call contact you.   Following lower endoscopy (colonoscopy or flexible sigmoidoscopy):  Excessive amounts of blood in the stool  Significant tenderness or worsening of abdominal pains  Swelling of the abdomen that is new, acute  Fever of 100F or higher   FOLLOW UP: If any biopsies were taken you will be contacted by phone or by letter within the next 1-3 weeks.  Call your gastroenterologist if you have not heard about the biopsies in 3 weeks.  Our staff will call the home number listed on your records the next business day following your procedure to check on you and address any questions or concerns that you may have at that time regarding the information given to you following your procedure. This is a courtesy call and so if there is no answer at the home number and we have not heard from you through the emergency physician on call, we will assume that you have returned to your regular daily activities without incident.  SIGNATURES/CONFIDENTIALITY: You and/or your care partner have signed paperwork which will be entered into your electronic medical record.  These signatures attest to the  fact that that the information above on your After Visit Summary has been reviewed and is understood.  Full responsibility of the confidentiality of this discharge information lies with you and/or your care-partner.

## 2012-03-20 NOTE — Progress Notes (Signed)
Patient did not experience any of the following events: a burn prior to discharge; a fall within the facility; wrong site/side/patient/procedure/implant event; or a hospital transfer or hospital admission upon discharge from the facility. (G8907) Patient did not have preoperative order for IV antibiotic SSI prophylaxis. (G8918)  

## 2012-03-20 NOTE — Progress Notes (Signed)
Called to room to assist during endoscopic procedure.  Patient ID and intended procedure confirmed with present staff. Received instructions for my participation in the procedure from the performing physician.  

## 2012-03-23 ENCOUNTER — Telehealth: Payer: Self-pay | Admitting: *Deleted

## 2012-03-23 NOTE — Telephone Encounter (Signed)
  Follow up Call-  Call back number 03/20/2012  Post procedure Call Back phone  # [phone]  Permission to leave phone message Yes     Patient questions:  Do you have a fever, pain , or abdominal swelling? no Pain Score  0 *  Have you tolerated food without any problems? yes  Have you been able to return to your normal activities? yes  Do you have any questions about your discharge instructions: Diet   no Medications  no Follow up visit  no  Do you have questions or concerns about your Care? no  Actions: * If pain score is 4 or above: No action needed, pain <4.

## 2012-03-25 ENCOUNTER — Encounter: Payer: Self-pay | Admitting: Gastroenterology

## 2014-09-02 ENCOUNTER — Encounter: Payer: Self-pay | Admitting: Gastroenterology

## 2014-12-12 ENCOUNTER — Other Ambulatory Visit: Payer: Self-pay | Admitting: Internal Medicine

## 2014-12-12 DIAGNOSIS — R109 Unspecified abdominal pain: Secondary | ICD-10-CM

## 2014-12-16 ENCOUNTER — Ambulatory Visit
Admission: RE | Admit: 2014-12-16 | Discharge: 2014-12-16 | Disposition: A | Discharge status: Home / Self Care | Payer: Commercial Managed Care - PPO | Source: Ambulatory Visit | Attending: Internal Medicine | Admitting: Internal Medicine

## 2014-12-16 DIAGNOSIS — R109 Unspecified abdominal pain: Secondary | ICD-10-CM

## 2014-12-16 MED ORDER — IOPAMIDOL (ISOVUE-300) INJECTION 61%
100.0000 mL | Freq: Once | INTRAVENOUS | Status: AC | PRN
  Administered 2014-12-16: 100 mL via INTRAVENOUS

## 2015-01-19 ENCOUNTER — Encounter: Payer: Self-pay | Admitting: Gastroenterology

## 2015-01-19 ENCOUNTER — Ambulatory Visit (INDEPENDENT_AMBULATORY_CARE_PROVIDER_SITE_OTHER): Payer: Commercial Managed Care - PPO | Admitting: Gastroenterology

## 2015-01-19 VITALS — BP 100/60 | HR 72 | Ht 69.0 in | Wt 160.0 lb

## 2015-01-19 DIAGNOSIS — Z86010 Personal history of colonic polyps: Secondary | ICD-10-CM

## 2015-01-19 DIAGNOSIS — R1031 Right lower quadrant pain: Secondary | ICD-10-CM | POA: Diagnosis not present

## 2015-01-19 DIAGNOSIS — I868 Varicose veins of other specified sites: Secondary | ICD-10-CM | POA: Diagnosis not present

## 2015-01-19 MED ORDER — NA SULFATE-K SULFATE-MG SULF 17.5-3.13-1.6 GM/177ML PO SOLN: 1.0000 | Freq: Once | ORAL | Status: DC

## 2015-01-19 NOTE — Progress Notes (Signed)
    History of Present Illness: This is a 64 year old male complaining of right lower quadrant pain. He noted the onset of right lower quadrant and right flank pain while running several weeks ago and he needed to stop running and walking. Symptoms gradually improved over the next few weeks without running. He resumed running and the pain returned so he has again discontinued running. Pain does not relate to any digestive function. Pain has now resolved. Patient was evaluated by Dr. Dagmar Hait and I reviewed his office note. Initially the pain did not respond to NSAIDs. He underwent a CT scan of the abdomen/pelvis with results below. Essentially the colon looks the same as compared to 2005 CT scan. He is due for 3 year surveillance colonoscopy for a history of adenomatous colon polyps. He has known idiopathic colonic varices leading to an abnormal appearing thickened colon and ileum on imaging studies. Denies weight loss, constipation, diarrhea, change in stool caliber, melena, hematochezia, nausea, vomiting, dysphagia, reflux symptoms, chest pain.  Abd/pelvic CT 12/16/2014 IMPRESSION: 1. No acute abnormalities involving the abdomen or pelvis. 2. Wall thickening involving a long segment of the distal and terminal ileum, the appendix, the cecum, the ascending colon, the transverse colon and to a lesser degree the descending colon. There is no associated edema/inflammation. On the report of the prior CT from 2005, the wall thickening was felt to be due to varices as demonstrated on endoscopy. Dilated mesenteric veins are not visible on CT. There is less opacification of veins draining the ascending and transverse colon compared with those from the descending colon and sigmoid colon, indicating the previously identified varices may be due to small vessel veno-occlusive disease. Any event, these findings are unchanged since the prior CT. 3. Mild diffuse hepatic stress that diffuse hepatic steatosis without significant  focal hepatic parenchymal abnormality. 4. Nonobstructing 3 mm and smaller calculi in both kidneys. 5. Asymmetric thickening involving the left anterolateral wall of the urinary bladder. Does the patient have hematuria? If not, this may be post radiation scarring.   Current Medications, Allergies, Past Medical History, Past Surgical History, Family History and Social History were reviewed in [employer] record.  Physical Exam: General: Well developed, well nourished, no acute distress Head: Normocephalic and atraumatic Eyes:  sclerae anicteric, EOMI Ears: Normal auditory acuity Mouth: No deformity or lesions Lungs: Clear throughout to auscultation Heart: Regular rate and rhythm; no murmurs, rubs or bruits Abdomen: Soft, non tender and non distended. No masses, hepatosplenomegaly or hernias noted. Normal Bowel sounds Rectal: Deferred to colonoscopy  Musculoskeletal: Symmetrical with no gross deformities  Pulses:  Normal pulses noted Extremities: No clubbing, cyanosis, edema or deformities noted Neurological: Alert oriented x 4, grossly nonfocal Psychological:  Alert and cooperative. Normal mood and affect  Assessment and Recommendations:  1. RLQ pain with musculoskeletal features. Rule out radicular pain related to lumbar spine problems. Rule out other musculoskeletal problem. This pain does not seem to be gastrointestinal in etiology. Further evaluation per Dr. Dagmar Hait.  2. Abnormally thickened ileum and colon on imaging. Unchanged since 2005. Known idiopathic colonic varices.  3. Personal history of adenomatous colon polyps. Schedule colonoscopy. The risks (including bleeding, perforation, infection, missed lesions, medication reactions and possible hospitalization or surgery if complications occur), benefits, and alternatives to colonoscopy with possible biopsy and possible polypectomy were discussed with the patient and they consent to proceed.

## 2015-01-19 NOTE — Patient Instructions (Addendum)
You have been scheduled for a colonoscopy. Please follow written instructions given to you at your visit today.  Please pick up your prep supplies at the pharmacy within the next 1-3 days. If you use inhalers (even only as needed), please bring them with you on the day of your procedure. Your physician has requested that you go to www.startemmi.com and enter the access code given to you at your visit today. This web site gives a general overview about your procedure. However, you should still follow specific instructions given to you by our office regarding your preparation for the procedure.  Thank you for choosing me and [facility].  Pricilla Riffle. Dagoberto Ligas., MD., [facility]  cc: Prince Solian, MD

## 2015-03-13 ENCOUNTER — Encounter: Payer: Commercial Managed Care - PPO | Admitting: Gastroenterology

## 2015-04-13 ENCOUNTER — Encounter: Payer: Self-pay | Admitting: Gastroenterology

## 2015-05-02 ENCOUNTER — Encounter: Payer: Self-pay | Admitting: Gastroenterology

## 2015-05-02 ENCOUNTER — Ambulatory Visit (AMBULATORY_SURGERY_CENTER): Payer: Commercial Managed Care - PPO | Admitting: Gastroenterology

## 2015-05-02 ENCOUNTER — Encounter: Payer: Commercial Managed Care - PPO | Admitting: Gastroenterology

## 2015-05-02 VITALS — BP 137/87 | HR 57 | Temp 97.1°F | Resp 11 | Ht 69.0 in | Wt 160.0 lb

## 2015-05-02 DIAGNOSIS — D123 Benign neoplasm of transverse colon: Secondary | ICD-10-CM | POA: Diagnosis not present

## 2015-05-02 DIAGNOSIS — Z86010 Personal history of colonic polyps: Secondary | ICD-10-CM | POA: Diagnosis present

## 2015-05-02 MED ORDER — SODIUM CHLORIDE 0.9 % IV SOLN: 500.0000 mL | INTRAVENOUS | Status: DC

## 2015-05-02 NOTE — Patient Instructions (Signed)
YOU HAD AN ENDOSCOPIC PROCEDURE TODAY AT [facility]:   Refer to the procedure report that was given to you for any specific questions about what was found during the examination.  If the procedure report does not answer your questions, please call your gastroenterologist to clarify.  If you requested that your care partner not be given the details of your procedure findings, then the procedure report has been included in a sealed envelope for you to review at your convenience later.  YOU SHOULD EXPECT: Some feelings of bloating in the abdomen. Passage of more gas than usual.  Walking can help get rid of the air that was put into your GI tract during the procedure and reduce the bloating. If you had a lower endoscopy (such as a colonoscopy or flexible sigmoidoscopy) you may notice spotting of blood in your stool or on the toilet paper. If you underwent a bowel prep for your procedure, you may not have a normal bowel movement for a few days.  Please Note:  You might notice some irritation and congestion in your nose or some drainage.  This is from the oxygen used during your procedure.  There is no need for concern and it should clear up in a day or so.  SYMPTOMS TO REPORT IMMEDIATELY:   Following lower endoscopy (colonoscopy or flexible sigmoidoscopy):  Excessive amounts of blood in the stool  Significant tenderness or worsening of abdominal pains  Swelling of the abdomen that is new, acute  Fever of 100F or higher    For urgent or emergent issues, a gastroenterologist can be reached at any hour by calling [phone].   DIET: Your first meal following the procedure should be a small meal and then it is ok to progress to your normal diet. Heavy or fried foods are harder to digest and may make you feel nauseous or bloated.  Likewise, meals heavy in dairy and vegetables can increase bloating.  Drink plenty of fluids but you should avoid alcoholic beverages for 24  hours.  ACTIVITY:  You should plan to take it easy for the rest of today and you should NOT DRIVE or use heavy machinery until tomorrow (because of the sedation medicines used during the test).    FOLLOW UP: Our staff will call the number listed on your records the next business day following your procedure to check on you and address any questions or concerns that you may have regarding the information given to you following your procedure. If we do not reach you, we will leave a message.  However, if you are feeling well and you are not experiencing any problems, there is no need to return our call.  We will assume that you have returned to your regular daily activities without incident.  If any biopsies were taken you will be contacted by phone or by letter within the next 1-3 weeks.  Please call us at [phone] if you have not heard about the biopsies in 3 weeks.    SIGNATURES/CONFIDENTIALITY: You and/or your care partner have signed paperwork which will be entered into your electronic medical record.  These signatures attest to the fact that that the information above on your After Visit Summary has been reviewed and is understood.  Full responsibility of the confidentiality of this discharge information lies with you and/or your care-partner.   INFORMATION ON POLYPS AND HEMORRHOIDS GIVEN TO YOU TODAY  HOLD ASPIRIN AND ANTI-INFLAMMATORY PRODUCTS FOR 3 WEEKS  REPEAT COLONOSCOPY IN  3 YEARS  A CARD TO CARRY IN YOUR WALLET GIVEN TO YOU TODAY INDICATING 3 CLIPS PLACED AT POLYP REMOVAL SITES IN TRANSVERSE COLON

## 2015-05-02 NOTE — Progress Notes (Signed)
Report to PACU, RN, vss, BBS= Clear.  

## 2015-05-02 NOTE — Op Note (Signed)
[place]  [address] [place], [address]   COLONOSCOPY PROCEDURE REPORT  PATIENT: Raidyn, Bielinski  MR#: [id number] BIRTHDATE: 1950-06-30 , 88  yrs. old GENDER: male ENDOSCOPIST: Ladene Artist, MD, [facility] REFERRED BY: Prince Solian, M.D. PROCEDURE DATE:  05/02/2015 PROCEDURE:   Colonoscopy, surveillance , Colonoscopy with biopsy, Colonoscopy with snare polypectomy, and Colonoscopy with control of bleeding First Screening Colonoscopy - Avg.  risk and is 50 yrs.  old or older - No.  Prior Negative Screening - Now for repeat screening. N/A  History of Adenoma - Now for follow-up colonoscopy & has been > or = to 3 yrs.  Yes hx of adenoma.  Has been 3 or more years since last colonoscopy.  Polyps removed today? Yes ASA CLASS:   Class II INDICATIONS:Surveillance due to prior colonic neoplasia, PH Colon Adenoma, and an abnormal CT of colon and TI. MEDICATIONS: Monitored anesthesia care and Propofol 320 mg IV  DESCRIPTION OF PROCEDURE:   After the risks benefits and alternatives of the procedure were thoroughly explained, informed consent was obtained.  The digital rectal exam revealed no abnormalities of the rectum.   The LB [id number]-H190 [id number]  endoscope was introduced through the anus and advanced to the terminal ileum which was intubated for a short distance. No adverse events experienced.   The quality of the prep was good.  (Suprep was used) The instrument was then slowly withdrawn as the colon was fully examined. Estimated blood loss is minimal.   COLON FINDINGS: A sessile polyp measuring 5 mm in size was found in the proximal transverse colon.  A polypectomy was performed with cold forceps.  The resection was complete, the polyp tissue was completely retrieved and sent to histology. Oozing from the site occured and 3 cc of 1:10,000 Epinephrine solution injection was given to control bleeding.  Bleeding at the site was controlled using hemoclips.  Two (2)  placements were made.  There was minimal blood loss from maneuver subsiding by end of procedure.   An oozing sessile polyp measuring 18 mm in size was found in the distal transverse colon.  A polypectomy was performed using snare cautery. The resection was complete, the polyp tissue was completely retrieved and sent to histology.  2 cc 1:10,000 Epinephrine solution injection was given to control bleeding. One hemoclip was placed at the site given the diffuse varices.  Bleeding at the site was controlled.   Varices throughout colon and in distal TI.   The examination was otherwise normal.  Retroflexed views revealed internal Grade I hemorrhoids. The time to cecum = 3.2 Withdrawal time = 33.4   The scope was withdrawn and the procedure completed. COMPLICATIONS: There were no immediate complications.  ENDOSCOPIC IMPRESSION: 1.   Sessile polyp in the proximal transverse colon; polypectomy performed with cold forceps; epinephrine injection given to control bleeding; bleeding at the site controlled using hemoclips 2.  Oozing sessile polyp in the distal transverse colon; polypectomy performed using snare cautery; epinephrine injection given to prevent bleeding; prevention of bleeding at the site using a hemoclip 3.   Varices throughout colon and in distal TI 4.   Grade l internal hemorrhoids  RECOMMENDATIONS: 1.  Hold Aspirin and all other NSAIDS for 3 weeks and no strenusous exertion for 1 week 2.  Await pathology results 3.  Repeat Colonoscopy in 3 years.  eSigned:  Ladene Artist, MD, [facility] 05/02/2015 11:23 AM      PATIENT NAME:  Tyler [name], Tyler [name] MR#: [id number]

## 2015-05-02 NOTE — Progress Notes (Signed)
Called to room to assist during endoscopic procedure.  Patient ID and intended procedure confirmed with present staff. Received instructions for my participation in the procedure from the performing physician.  

## 2015-05-03 ENCOUNTER — Telehealth: Payer: Self-pay | Admitting: *Deleted

## 2015-05-03 NOTE — Telephone Encounter (Signed)
No answer left message to call if patient has any questions. [id number]

## 2015-05-08 ENCOUNTER — Encounter: Payer: Self-pay | Admitting: Gastroenterology

## 2015-06-13 ENCOUNTER — Other Ambulatory Visit (HOSPITAL_COMMUNITY): Payer: Self-pay | Admitting: Otolaryngology

## 2015-06-13 DIAGNOSIS — H903 Sensorineural hearing loss, bilateral: Secondary | ICD-10-CM

## 2015-06-22 ENCOUNTER — Ambulatory Visit (HOSPITAL_COMMUNITY)
Admission: RE | Admit: 2015-06-22 | Discharge: 2015-06-22 | Disposition: A | Discharge status: Home / Self Care | Payer: Commercial Managed Care - PPO | Source: Ambulatory Visit | Attending: Otolaryngology | Admitting: Otolaryngology

## 2015-06-22 DIAGNOSIS — Q283 Other malformations of cerebral vessels: Secondary | ICD-10-CM | POA: Diagnosis not present

## 2015-06-22 DIAGNOSIS — R938 Abnormal findings on diagnostic imaging of other specified body structures: Secondary | ICD-10-CM | POA: Diagnosis not present

## 2015-06-22 DIAGNOSIS — H903 Sensorineural hearing loss, bilateral: Secondary | ICD-10-CM | POA: Insufficient documentation

## 2015-06-22 LAB — POCT I-STAT CREATININE: Creatinine, Ser: 0.7 mg/dL (ref 0.61–1.24)

## 2015-06-22 MED ORDER — GADOBENATE DIMEGLUMINE 529 MG/ML IV SOLN
15.0000 mL | Freq: Once | INTRAVENOUS | Status: AC | PRN
  Administered 2015-06-22: 15 mL via INTRAVENOUS

## 2016-01-03 HISTORY — PX: TYMPANOPLASTY: SHX33

## 2016-01-19 ENCOUNTER — Other Ambulatory Visit: Payer: Self-pay | Admitting: Otolaryngology

## 2017-12-09 DIAGNOSIS — F418 Other specified anxiety disorders: Secondary | ICD-10-CM | POA: Diagnosis not present

## 2017-12-10 DIAGNOSIS — I10 Essential (primary) hypertension: Secondary | ICD-10-CM | POA: Diagnosis not present

## 2017-12-10 DIAGNOSIS — Z6825 Body mass index (BMI) 25.0-25.9, adult: Secondary | ICD-10-CM | POA: Diagnosis not present

## 2017-12-10 DIAGNOSIS — D696 Thrombocytopenia, unspecified: Secondary | ICD-10-CM | POA: Diagnosis not present

## 2017-12-10 DIAGNOSIS — K921 Melena: Secondary | ICD-10-CM | POA: Diagnosis not present

## 2017-12-16 DIAGNOSIS — Z1212 Encounter for screening for malignant neoplasm of rectum: Secondary | ICD-10-CM | POA: Diagnosis not present

## 2017-12-30 DIAGNOSIS — F418 Other specified anxiety disorders: Secondary | ICD-10-CM | POA: Diagnosis not present

## 2018-01-12 DIAGNOSIS — Z8546 Personal history of malignant neoplasm of prostate: Secondary | ICD-10-CM | POA: Diagnosis not present

## 2018-01-20 DIAGNOSIS — N5201 Erectile dysfunction due to arterial insufficiency: Secondary | ICD-10-CM | POA: Diagnosis not present

## 2018-01-20 DIAGNOSIS — Z8546 Personal history of malignant neoplasm of prostate: Secondary | ICD-10-CM | POA: Diagnosis not present

## 2018-01-21 DIAGNOSIS — F418 Other specified anxiety disorders: Secondary | ICD-10-CM | POA: Diagnosis not present

## 2018-01-27 DIAGNOSIS — Z1212 Encounter for screening for malignant neoplasm of rectum: Secondary | ICD-10-CM | POA: Diagnosis not present

## 2018-02-03 ENCOUNTER — Ambulatory Visit (INDEPENDENT_AMBULATORY_CARE_PROVIDER_SITE_OTHER): Payer: PPO | Admitting: Gastroenterology

## 2018-02-03 ENCOUNTER — Encounter: Payer: Self-pay | Admitting: Gastroenterology

## 2018-02-03 ENCOUNTER — Encounter (INDEPENDENT_AMBULATORY_CARE_PROVIDER_SITE_OTHER): Payer: Self-pay

## 2018-02-03 VITALS — BP 120/68 | HR 62 | Ht 69.0 in | Wt 173.4 lb

## 2018-02-03 DIAGNOSIS — Z86010 Personal history of colonic polyps: Secondary | ICD-10-CM

## 2018-02-03 DIAGNOSIS — K921 Melena: Secondary | ICD-10-CM | POA: Diagnosis not present

## 2018-02-03 MED ORDER — NA SULFATE-K SULFATE-MG SULF 17.5-3.13-1.6 GM/177ML PO SOLN: 1.0000 | Freq: Once | ORAL | 0 refills | Status: AC

## 2018-02-03 MED ORDER — DICYCLOMINE HCL 10 MG PO CAPS: 10.0000 mg | ORAL_CAPSULE | Freq: Three times a day (TID) | ORAL | 3 refills | Status: DC | PRN

## 2018-02-03 NOTE — Progress Notes (Signed)
    History of Present Illness: This is a 67 year old male who relates occasional small amounts of bright red blood with bowel movements.  Recent stool Hemoccult testing was positive.  He has a history of tubulovillous adenomas and idiopathic colonic varices.  Internal hemorrhoids noted on last colonoscopy as well.  Occasionally has mild constipation.  He has occasional lower abdominal pain that may proceed a bowel movement by several minutes to several hours.  The symptoms occur intermittently.  Not necessarily associated with any change in bowel function.  Denies weight loss,  diarrhea, change in stool caliber, melena, nausea, vomiting, dysphagia, reflux symptoms, chest pain.   Current Medications, Allergies, Past Medical History, Past Surgical History, Family History and Social History were reviewed in [employer] record.  Physical Exam: General: Well developed, well nourished, no acute distress Head: Normocephalic and atraumatic Eyes:  sclerae anicteric, EOMI Ears: Normal auditory acuity Mouth: No deformity or lesions Lungs: Clear throughout to auscultation Heart: Regular rate and rhythm; no murmurs, rubs or bruits Abdomen: Soft, non tender and non distended. No masses, hepatosplenomegaly or hernias noted. Normal Bowel sounds Rectal: Deferred to colonoscopy Musculoskeletal: Symmetrical with no gross deformities  Pulses:  Normal pulses noted Extremities: No clubbing, cyanosis, edema or deformities noted Neurological: Alert oriented x 4, grossly nonfocal Psychological:  Alert and cooperative. Normal mood and affect   Assessment and Recommendations:  1. Hematochezia.  Hemoccult positive stool.  Both are likely secondary to internal hemorrhoids.  Begin Preparation H suppositories PR daily for 7 days and then as needed.  If hemorrhoidal symptoms persist consider hemorrhoidal banding.  Further evaluation with colonoscopy. The risks (including bleeding, perforation,  infection, missed lesions, medication reactions and possible hospitalization or surgery if complications occur), benefits, and alternatives to colonoscopy with possible biopsy and possible polypectomy were discussed with the patient and they consent to proceed.   2. Personal history of tubulovillous adenomatous polyps.  He is due for 3-year interval surveillance colonoscopy in February.  Colonoscopy as above.  3. History of idiopathic colonic varices.  Reevaluate at colonoscopy as above.  4.  Constipation.  Intermittent lower abdominal pain.  Possible IBS as symptoms are relieved with bowel movements.  Increase dietary fiber and daily water intake.  Increase stool softeners to twice daily. Trial of dicyclomine 10 mg 3 times daily as needed.

## 2018-02-03 NOTE — Patient Instructions (Signed)
We have sent the following medications to your pharmacy for you to pick up at your convenience: bentyl to take three times a day as needed for cramping and abdominal pain.  You can use over the counter preparation H suppositories at bedtime x 1 week then as needed for hemorrhoid symptoms.   You have been scheduled for a colonoscopy. Please follow written instructions given to you at your visit today.  Please pick up your prep supplies at the pharmacy within the next 1-3 days. If you use inhalers (even only as needed), please bring them with you on the day of your procedure. Your physician has requested that you go to www.startemmi.com and enter the access code given to you at your visit today. This web site gives a general overview about your procedure. However, you should still follow specific instructions given to you by our office regarding your preparation for the procedure.  Thank you for choosing me and [facility].  Pricilla Riffle. Dagoberto Ligas., MD., [facility]

## 2018-02-17 DIAGNOSIS — F418 Other specified anxiety disorders: Secondary | ICD-10-CM | POA: Diagnosis not present

## 2018-03-02 ENCOUNTER — Encounter: Payer: Self-pay | Admitting: Gastroenterology

## 2018-03-11 ENCOUNTER — Ambulatory Visit (AMBULATORY_SURGERY_CENTER): Payer: PPO | Admitting: Gastroenterology

## 2018-03-11 ENCOUNTER — Encounter: Payer: Self-pay | Admitting: Gastroenterology

## 2018-03-11 VITALS — BP 102/72 | HR 70 | Temp 96.9°F | Resp 15

## 2018-03-11 DIAGNOSIS — Z86010 Personal history of colonic polyps: Secondary | ICD-10-CM | POA: Diagnosis not present

## 2018-03-11 DIAGNOSIS — K649 Unspecified hemorrhoids: Secondary | ICD-10-CM | POA: Diagnosis not present

## 2018-03-11 DIAGNOSIS — K5520 Angiodysplasia of colon without hemorrhage: Secondary | ICD-10-CM | POA: Diagnosis not present

## 2018-03-11 DIAGNOSIS — D123 Benign neoplasm of transverse colon: Secondary | ICD-10-CM | POA: Diagnosis not present

## 2018-03-11 DIAGNOSIS — K921 Melena: Secondary | ICD-10-CM

## 2018-03-11 DIAGNOSIS — D125 Benign neoplasm of sigmoid colon: Secondary | ICD-10-CM | POA: Diagnosis not present

## 2018-03-11 DIAGNOSIS — R195 Other fecal abnormalities: Secondary | ICD-10-CM

## 2018-03-11 MED ORDER — SODIUM CHLORIDE 0.9 % IV SOLN: 500.0000 mL | Freq: Once | INTRAVENOUS | Status: DC

## 2018-03-11 NOTE — Progress Notes (Signed)
To Pacu, VSS. Report to [email or web address] 

## 2018-03-11 NOTE — Progress Notes (Signed)
Pt's states no medical or surgical changes since previsit or office visit. 

## 2018-03-11 NOTE — Op Note (Signed)
[facility] Patient Name: Tyler [name] Procedure Date: 03/11/2018 10:37 AM MRN: [id number] Endoscopist: Ladene Artist , MD Age: 68 Referring MD:  Date of Birth: [birth year] Gender: Male Account #: [id number] Procedure:                Colonoscopy Indications:              Hematochezia, Heme positive stool, History of                            adenomatous colon polyps Medicines:                Monitored Anesthesia Care Procedure:                Pre-Anesthesia Assessment:                           - Prior to the procedure, a History and Physical                            was performed, and patient medications and                            allergies were reviewed. The patient's tolerance of                            previous anesthesia was also reviewed. The risks                            and benefits of the procedure and the sedation                            options and risks were discussed with the patient.                            All questions were answered, and informed consent                            was obtained. Prior Anticoagulants: The patient has                            taken no previous anticoagulant or antiplatelet                            agents. ASA Grade Assessment: II - A patient with                            mild systemic disease. After reviewing the risks                            and benefits, the patient was deemed in                            satisfactory condition to undergo the procedure.  After obtaining informed consent, the colonoscope                            was passed under direct vision. Throughout the                            procedure, the patient's blood pressure, pulse, and                            oxygen saturations were monitored continuously. The                            Colonoscope was introduced through the anus and                            advanced to the the cecum, identified by                           appendiceal orifice and ileocecal valve. The                            ileocecal valve, appendiceal orifice, and rectum                            were photographed. The quality of the bowel                            preparation was good. The patient tolerated the                            procedure well. The colonoscopy was technically                            difficult and complex due to abnormal anatomy,                            diffuse large varices. Scope In: 10:47:18 AM Scope Out: 11:07:09 AM Scope Withdrawal Time: 0 hours 12 minutes 16 seconds  Total Procedure Duration: 0 hours 19 minutes 51 seconds  Findings:                 The perianal and digital rectal examinations were                            normal.                           Two sessile polyps were found in the sigmoid colon                            and transverse colon. The polyps were 7 to 10 mm in                            size. These polyps were removed with a hot snare.  Resection and retrieval were complete.                           Large, non-bleeding pancolonic varices were found.                           Internal hemorrhoids (vs varices) were found during                            retroflexion. The hemorrhoids were medium-sized and                            Grade I (internal hemorrhoids that do not prolapse).                           The exam was otherwise without abnormality on                            direct and retroflexion views. Complications:            No immediate complications. Estimated blood loss:                            None. Estimated Blood Loss:     Estimated blood loss: none. Impression:               - Two 7 to 10 mm polyps in the sigmoid colon and in                            the transverse colon, removed with a hot snare.                            Resected and retrieved.                           - Pancolonic varices.                            - Internal hemorrhoids.                           - The examination was otherwise normal on direct                            and retroflexion views. Recommendation:           - Repeat colonoscopy in 3 years for surveillance.                           - Patient has a contact number available for                            emergencies. The signs and symptoms of potential                            delayed complications were discussed with the  patient. Return to normal activities tomorrow.                            Written discharge instructions were provided to the                            patient.                           - Resume previous diet.                           - Continue present medications.                           - Await pathology results.                           - No aspirin, ibuprofen, naproxen, or other                            non-steroidal anti-inflammatory drugs for 2 weeks                            after polyp removal. Ladene Artist, MD 03/11/2018 11:17:48 AM This report has been signed electronically.

## 2018-03-11 NOTE — Patient Instructions (Signed)
Continue present medications.  Await pathology results. No aspirin, ibuprofen, naproxen, or other non-steriodal anti-inflammatory drugs for 2 weeks. Please read handouts provided.     YOU HAD AN ENDOSCOPIC PROCEDURE TODAY AT [facility]:   Refer to the procedure report that was given to you for any specific questions about what was found during the examination.  If the procedure report does not answer your questions, please call your gastroenterologist to clarify.  If you requested that your care partner not be given the details of your procedure findings, then the procedure report has been included in a sealed envelope for you to review at your convenience later.  YOU SHOULD EXPECT: Some feelings of bloating in the abdomen. Passage of more gas than usual.  Walking can help get rid of the air that was put into your GI tract during the procedure and reduce the bloating. If you had a lower endoscopy (such as a colonoscopy or flexible sigmoidoscopy) you may notice spotting of blood in your stool or on the toilet paper. If you underwent a bowel prep for your procedure, you may not have a normal bowel movement for a few days.  Please Note:  You might notice some irritation and congestion in your nose or some drainage.  This is from the oxygen used during your procedure.  There is no need for concern and it should clear up in a day or so.  SYMPTOMS TO REPORT IMMEDIATELY:   Following lower endoscopy (colonoscopy or flexible sigmoidoscopy):  Excessive amounts of blood in the stool  Significant tenderness or worsening of abdominal pains  Swelling of the abdomen that is new, acute  Fever of 100F or higher   For urgent or emergent issues, a gastroenterologist can be reached at any hour by calling [phone].   DIET:  We do recommend a small meal at first, but then you may proceed to your regular diet.  Drink plenty of fluids but you should avoid alcoholic beverages for 24  hours.  ACTIVITY:  You should plan to take it easy for the rest of today and you should NOT DRIVE or use heavy machinery until tomorrow (because of the sedation medicines used during the test).    FOLLOW UP: Our staff will call the number listed on your records the next business day following your procedure to check on you and address any questions or concerns that you may have regarding the information given to you following your procedure. If we do not reach you, we will leave a message.  However, if you are feeling well and you are not experiencing any problems, there is no need to return our call.  We will assume that you have returned to your regular daily activities without incident.  If any biopsies were taken you will be contacted by phone or by letter within the next 1-3 weeks.  Please call us at [phone] if you have not heard about the biopsies in 3 weeks.    SIGNATURES/CONFIDENTIALITY: You and/or your care partner have signed paperwork which will be entered into your electronic medical record.  These signatures attest to the fact that that the information above on your After Visit Summary has been reviewed and is understood.  Full responsibility of the confidentiality of this discharge information lies with you and/or your care-partner.

## 2018-03-11 NOTE — Progress Notes (Signed)
Called to room to assist during endoscopic procedure.  Patient ID and intended procedure confirmed with present staff. Received instructions for my participation in the procedure from the performing physician.  

## 2018-03-12 ENCOUNTER — Telehealth: Payer: Self-pay

## 2018-03-12 NOTE — Telephone Encounter (Signed)
  Follow up Call-  Call back number 03/11/2018  Post procedure Call Back phone  # [phone]  Permission to leave phone message Yes  Some recent data might be hidden     Patient questions:  Do you have a fever, pain , or abdominal swelling? No. Pain Score  0 *  Have you tolerated food without any problems? Yes.    Have you been able to return to your normal activities? Yes.    Do you have any questions about your discharge instructions: Diet   No. Medications  No. Follow up visit  No.  Do you have questions or concerns about your Care? No.  Actions: * If pain score is 4 or above: No action needed, pain <4.

## 2018-03-18 ENCOUNTER — Encounter: Payer: Self-pay | Admitting: Gastroenterology

## 2018-03-23 DIAGNOSIS — F418 Other specified anxiety disorders: Secondary | ICD-10-CM | POA: Diagnosis not present

## 2018-04-06 DIAGNOSIS — R7301 Impaired fasting glucose: Secondary | ICD-10-CM | POA: Diagnosis not present

## 2018-04-06 DIAGNOSIS — E7849 Other hyperlipidemia: Secondary | ICD-10-CM | POA: Diagnosis not present

## 2018-04-06 DIAGNOSIS — R82998 Other abnormal findings in urine: Secondary | ICD-10-CM | POA: Diagnosis not present

## 2018-04-06 DIAGNOSIS — Z125 Encounter for screening for malignant neoplasm of prostate: Secondary | ICD-10-CM | POA: Diagnosis not present

## 2018-04-06 DIAGNOSIS — I10 Essential (primary) hypertension: Secondary | ICD-10-CM | POA: Diagnosis not present

## 2018-04-13 DIAGNOSIS — F39 Unspecified mood [affective] disorder: Secondary | ICD-10-CM | POA: Diagnosis not present

## 2018-04-13 DIAGNOSIS — R7301 Impaired fasting glucose: Secondary | ICD-10-CM | POA: Diagnosis not present

## 2018-04-13 DIAGNOSIS — Z1331 Encounter for screening for depression: Secondary | ICD-10-CM | POA: Diagnosis not present

## 2018-04-13 DIAGNOSIS — I10 Essential (primary) hypertension: Secondary | ICD-10-CM | POA: Diagnosis not present

## 2018-04-13 DIAGNOSIS — Z6825 Body mass index (BMI) 25.0-25.9, adult: Secondary | ICD-10-CM | POA: Diagnosis not present

## 2018-04-13 DIAGNOSIS — D649 Anemia, unspecified: Secondary | ICD-10-CM | POA: Diagnosis not present

## 2018-04-13 DIAGNOSIS — F418 Other specified anxiety disorders: Secondary | ICD-10-CM | POA: Diagnosis not present

## 2018-04-13 DIAGNOSIS — H903 Sensorineural hearing loss, bilateral: Secondary | ICD-10-CM | POA: Diagnosis not present

## 2018-04-13 DIAGNOSIS — Z23 Encounter for immunization: Secondary | ICD-10-CM | POA: Diagnosis not present

## 2018-04-13 DIAGNOSIS — Z0000 Encounter for general adult medical examination without abnormal findings: Secondary | ICD-10-CM | POA: Diagnosis not present

## 2018-04-13 DIAGNOSIS — E7849 Other hyperlipidemia: Secondary | ICD-10-CM | POA: Diagnosis not present

## 2018-04-13 DIAGNOSIS — D696 Thrombocytopenia, unspecified: Secondary | ICD-10-CM | POA: Diagnosis not present

## 2018-04-13 DIAGNOSIS — C61 Malignant neoplasm of prostate: Secondary | ICD-10-CM | POA: Diagnosis not present

## 2018-04-14 ENCOUNTER — Telehealth: Payer: Self-pay

## 2018-04-14 NOTE — Telephone Encounter (Signed)
-----   Message from Ladene Artist, MD sent at 04/13/2018  5:33 PM EST ----- Dr Dagmar Hait forwarded records showing a new iron deficiency anemia. He had a colonoscopy in 03/2018 and office visit in 02/2018. No EGD in Epic. Please send tTG, IgA and schedule direct EGD for iron deficiency anemia and heme + stool.

## 2018-04-14 NOTE — Telephone Encounter (Signed)
Left message for patient to call back  

## 2018-04-15 LAB — IFOBT (OCCULT BLOOD): IFOBT: NEGATIVE

## 2018-04-15 NOTE — Telephone Encounter (Signed)
Left message for patient to call back  

## 2018-04-15 NOTE — Telephone Encounter (Signed)
Patient notified of the recommendations.  Patient declines to schedule direct.  He would like to come in and discuss with Dr. Fuller Plan prior to scheduling EGD or blood work.  He is scheduled for 05/05/18

## 2018-05-05 ENCOUNTER — Encounter: Payer: Self-pay | Admitting: Gastroenterology

## 2018-05-05 ENCOUNTER — Other Ambulatory Visit (INDEPENDENT_AMBULATORY_CARE_PROVIDER_SITE_OTHER): Payer: PPO

## 2018-05-05 ENCOUNTER — Ambulatory Visit (INDEPENDENT_AMBULATORY_CARE_PROVIDER_SITE_OTHER): Payer: PPO | Admitting: Gastroenterology

## 2018-05-05 VITALS — BP 100/66 | HR 76 | Ht 69.0 in | Wt 175.0 lb

## 2018-05-05 DIAGNOSIS — D509 Iron deficiency anemia, unspecified: Secondary | ICD-10-CM

## 2018-05-05 DIAGNOSIS — K921 Melena: Secondary | ICD-10-CM | POA: Diagnosis not present

## 2018-05-05 LAB — IGA: IGA: 86 mg/dL (ref 68–378)

## 2018-05-05 NOTE — Patient Instructions (Signed)
Your provider has requested that you go to the basement level for lab work before leaving today. Press "B" on the elevator. The lab is located at the first door on the left as you exit the elevator.  You have been scheduled for an endoscopy. Please follow written instructions given to you at your visit today. If you use inhalers (even only as needed), please bring them with you on the day of your procedure. Your physician has requested that you go to www.startemmi.com and enter the access code given to you at your visit today. This web site gives a general overview about your procedure. However, you should still follow specific instructions given to you by our office regarding your preparation for the procedure.  You can hold iron 1 day prior to your Upper Endoscopy.   Thank you for choosing me and [facility].  Pricilla Riffle. Dagoberto Ligas., MD., [facility]

## 2018-05-05 NOTE — Progress Notes (Signed)
    History of Present Illness: This is a 68 year old male with an iron deficiency anemia.  He is accompanied by his wife.  He states he has occasional loose stools and occasional small amounts of bright red blood per rectum with bowel movements.  Recent colonoscopy below.  No other gastrointestinal complaints.  Colonoscopy 03/2018 - Two 7 to 10 mm polyps in the sigmoid colon and in the transverse colon, removed with a hot snare. Resected and retrieved. (tubular adenoma, inflammatory polyp) - Pancolonic varices. - Internal hemorrhoids. - The examination was otherwise normal on direct and retroflexion views.  Current Medications, Allergies, Past Medical History, Past Surgical History, Family History and Social History were reviewed in [employer] record.  Physical Exam: General: Well developed, well nourished, no acute distress Head: Normocephalic and atraumatic Eyes:  sclerae anicteric, EOMI Ears: Normal auditory acuity Mouth: No deformity or lesions Lungs: Clear throughout to auscultation Heart: Regular rate and rhythm; no murmurs, rubs or bruits Abdomen: Soft, non tender and non distended. No masses, hepatosplenomegaly or hernias noted. Normal Bowel sounds Rectal: Not done Musculoskeletal: Symmetrical with no gross deformities  Pulses:  Normal pulses noted Extremities: No clubbing, cyanosis, edema or deformities noted Neurological: Alert oriented x 4, grossly nonfocal Psychological:  Alert and cooperative. Normal mood and affect   Assessment and Recommendations:  1.  Iron deficiency anemia.  Chronic losses from known colonic varices is possible.  Rule out upper GI sources of blood loss and celiac disease. IgA, tTG today.  Schedule EGD. The risks (including bleeding, perforation, infection, missed lesions, medication reactions and possible hospitalization or surgery if complications occur), benefits, and alternatives to endoscopy with possible biopsy and possible  dilation were discussed with the patient and they consent to proceed.   2. History of TVA.  A 3-year interval surveillance colonoscopy is recommended in January 2023.  3. Intermittent small-volume hematochezia from internal hemorrhoids (rectal varices).

## 2018-05-06 LAB — TISSUE TRANSGLUTAMINASE, IGA: (tTG) Ab, IgA: 1 U/mL

## 2018-05-27 ENCOUNTER — Encounter: Payer: PPO | Admitting: Gastroenterology

## 2018-05-30 ENCOUNTER — Other Ambulatory Visit: Payer: Self-pay | Admitting: Gastroenterology

## 2018-07-02 ENCOUNTER — Encounter: Payer: Self-pay | Admitting: *Deleted

## 2018-07-13 ENCOUNTER — Telehealth: Payer: Self-pay | Admitting: *Deleted

## 2018-07-13 ENCOUNTER — Encounter: Payer: Self-pay | Admitting: *Deleted

## 2018-07-13 NOTE — Telephone Encounter (Signed)
Erroneous encounter

## 2018-07-13 NOTE — Telephone Encounter (Signed)
Covid-19 travel screening questions  Have you traveled in the last 14 days? No If yes where?  Do you now or have you had a fever in the last 14 days? No  Do you have any respiratory symptoms of shortness of breath or cough now or in the last 14 days? No  Do you have any family members or close contacts with diagnosed or suspected Covid-19? No       

## 2018-07-14 ENCOUNTER — Other Ambulatory Visit: Payer: Self-pay

## 2018-07-14 ENCOUNTER — Encounter: Payer: Self-pay | Admitting: Gastroenterology

## 2018-07-14 ENCOUNTER — Ambulatory Visit (AMBULATORY_SURGERY_CENTER): Payer: PPO | Admitting: Gastroenterology

## 2018-07-14 VITALS — BP 129/78 | HR 69 | Temp 98.6°F | Resp 9 | Ht 69.0 in | Wt 175.0 lb

## 2018-07-14 DIAGNOSIS — D500 Iron deficiency anemia secondary to blood loss (chronic): Secondary | ICD-10-CM

## 2018-07-14 DIAGNOSIS — D509 Iron deficiency anemia, unspecified: Secondary | ICD-10-CM

## 2018-07-14 DIAGNOSIS — K2970 Gastritis, unspecified, without bleeding: Secondary | ICD-10-CM

## 2018-07-14 DIAGNOSIS — K2981 Duodenitis with bleeding: Secondary | ICD-10-CM | POA: Diagnosis not present

## 2018-07-14 DIAGNOSIS — K319 Disease of stomach and duodenum, unspecified: Secondary | ICD-10-CM

## 2018-07-14 DIAGNOSIS — K2980 Duodenitis without bleeding: Secondary | ICD-10-CM

## 2018-07-14 DIAGNOSIS — K2951 Unspecified chronic gastritis with bleeding: Secondary | ICD-10-CM | POA: Diagnosis not present

## 2018-07-14 DIAGNOSIS — K210 Gastro-esophageal reflux disease with esophagitis: Secondary | ICD-10-CM

## 2018-07-14 DIAGNOSIS — E7800 Pure hypercholesterolemia, unspecified: Secondary | ICD-10-CM | POA: Diagnosis not present

## 2018-07-14 DIAGNOSIS — I10 Essential (primary) hypertension: Secondary | ICD-10-CM | POA: Diagnosis not present

## 2018-07-14 DIAGNOSIS — K625 Hemorrhage of anus and rectum: Secondary | ICD-10-CM

## 2018-07-14 DIAGNOSIS — K449 Diaphragmatic hernia without obstruction or gangrene: Secondary | ICD-10-CM

## 2018-07-14 DIAGNOSIS — K209 Esophagitis, unspecified without bleeding: Secondary | ICD-10-CM

## 2018-07-14 DIAGNOSIS — R195 Other fecal abnormalities: Secondary | ICD-10-CM

## 2018-07-14 MED ORDER — SODIUM CHLORIDE 0.9 % IV SOLN: 500.0000 mL | Freq: Once | INTRAVENOUS | Status: DC

## 2018-07-14 MED ORDER — PANTOPRAZOLE SODIUM 40 MG PO TBEC: 40.0000 mg | DELAYED_RELEASE_TABLET | Freq: Every day | ORAL | 3 refills | Status: DC

## 2018-07-14 NOTE — Progress Notes (Signed)
Report given to PACU, vss 

## 2018-07-14 NOTE — Patient Instructions (Addendum)
YOU HAD AN ENDOSCOPIC PROCEDURE TODAY AT [facility]:   Refer to the procedure report that was given to you for any specific questions about what was found during the examination.  If the procedure report does not answer your questions, please call your gastroenterologist to clarify.  If you requested that your care partner not be given the details of your procedure findings, then the procedure report has been included in a sealed envelope for you to review at your convenience later.  YOU SHOULD EXPECT: Some feelings of bloating in the abdomen. Passage of more gas than usual.  Walking can help get rid of the air that was put into your GI tract during the procedure and reduce the bloating. If you had a lower endoscopy (such as a colonoscopy or flexible sigmoidoscopy) you may notice spotting of blood in your stool or on the toilet paper. If you underwent a bowel prep for your procedure, you may not have a normal bowel movement for a few days.  Please Note:  You might notice some irritation and congestion in your nose or some drainage.  This is from the oxygen used during your procedure.  There is no need for concern and it should clear up in a day or so.  SYMPTOMS TO REPORT IMMEDIATELY:    Following upper endoscopy (EGD)  Vomiting of blood or coffee ground material  New chest pain or pain under the shoulder blades  Painful or persistently difficult swallowing  New shortness of breath  Fever of 100F or higher  Black, tarry-looking stools  For urgent or emergent issues, a gastroenterologist can be reached at any hour by calling [phone].   DIET:  We do recommend a small meal at first, but then you may proceed to your regular diet.  Drink plenty of fluids but you should avoid alcoholic beverages for 24 hours. Read all of the handouts given to you by your recovery room nurse.  Especially, the ones regarding food.  Take your medication as directed.  ACTIVITY:  You should plan  to take it easy for the rest of today and you should NOT DRIVE or use heavy machinery until tomorrow (because of the sedation medicines used during the test).    FOLLOW UP: Our staff will call the number listed on your records the next business day following your procedure to check on you and address any questions or concerns that you may have regarding the information given to you following your procedure. If we do not reach you, we will leave a message.  However, if you are feeling well and you are not experiencing any problems, there is no need to return our call.  We will assume that you have returned to your regular daily activities without incident. We will be calling you two weeks following your procedure to see if you have developed any symptoms of the COVID-19.  If you develop any symptoms before then, please let us know.  If any biopsies were taken you will be contacted by phone or by letter within the next 1-3 weeks.  Please call us at [phone] if you have not heard about the biopsies in 3 weeks.    SIGNATURES/CONFIDENTIALITY: You and/or your care partner have signed paperwork which will be entered into your electronic medical record.  These signatures attest to the fact that that the information above on your After Visit Summary has been reviewed and is understood.  Full responsibility of the confidentiality of this discharge  information lies with you and/or your care-partner. 

## 2018-07-14 NOTE — Op Note (Signed)
[facility] Patient Name: Tyler [name] Procedure Date: 07/14/2018 10:28 AM MRN: [id number] Endoscopist: Ladene Artist , MD Age: 68 Referring MD:  Date of Birth: 14-Oct-1950 Gender: Male Account #: [id number] Procedure:                Upper GI endoscopy Indications:              Iron deficiency anemia Medicines:                Monitored Anesthesia Care Procedure:                Pre-Anesthesia Assessment:                           - Prior to the procedure, a History and Physical                            was performed, and patient medications and                            allergies were reviewed. The patient's tolerance of                            previous anesthesia was also reviewed. The risks                            and benefits of the procedure and the sedation                            options and risks were discussed with the patient.                            All questions were answered, and informed consent                            was obtained. Prior Anticoagulants: The patient has                            taken no previous anticoagulant or antiplatelet                            agents. ASA Grade Assessment: II - A patient with                            mild systemic disease. After reviewing the risks                            and benefits, the patient was deemed in                            satisfactory condition to undergo the procedure.                           After obtaining informed consent, the endoscope was  passed under direct vision. Throughout the                            procedure, the patient's blood pressure, pulse, and                            oxygen saturations were monitored continuously. The                            Endoscope was introduced through the mouth, and                            advanced to the second part of duodenum. The upper                            GI endoscopy was accomplished  without difficulty.                            The patient tolerated the procedure well. Scope In: Scope Out: Findings:                 LA Grade A (one or more mucosal breaks less than 5                            mm, not extending between tops of 2 mucosal folds)                            esophagitis with no bleeding was found at the                            gastroesophageal junction.                           The exam of the esophagus was otherwise normal.                           Patchy mildly erythematous mucosa without bleeding                            was found in the gastric fundus and in the gastric                            body. Biopsies were taken with a cold forceps for                            histology.                           A small hiatal hernia was present.                           The exam of the stomach was otherwise normal.  The duodenal bulb and second portion of the                            duodenum were normal. Biopsies for histology were                            taken with a cold forceps for evaluation of celiac                            disease. Complications:            No immediate complications. Estimated Blood Loss:     Estimated blood loss was minimal. Impression:               - LA Grade A reflux esophagitis.                           - Erythematous mucosa in the gastric fundus and                            gastric body. Biopsied.                           - Small hiatal hernia.                           - Normal duodenal bulb and second portion of the                            duodenum. Biopsied. Recommendation:           - Patient has a contact number available for                            emergencies. The signs and symptoms of potential                            delayed complications were discussed with the                            patient. Return to normal activities tomorrow.                             Written discharge instructions were provided to the                            patient.                           - Resume previous diet.                           - Antireflux measures.                           - Continue present medications.                           -  Await pathology results.                           - Protonix (pantoprazole) 40 mg PO daily today. Ladene Artist, MD 07/14/2018 10:53:12 AM This report has been signed electronically.

## 2018-07-14 NOTE — Progress Notes (Signed)
Called to room to assist during endoscopic procedure.  Patient ID and intended procedure confirmed with present staff. Received instructions for my participation in the procedure from the performing physician.  

## 2018-07-16 ENCOUNTER — Telehealth: Payer: Self-pay | Admitting: *Deleted

## 2018-07-16 ENCOUNTER — Encounter: Payer: Self-pay | Admitting: Gastroenterology

## 2018-07-16 NOTE — Telephone Encounter (Signed)
  Follow up Call-  Call back number 07/14/2018 03/11/2018  Post procedure Call Back phone  # [phone]  Permission to leave phone message Yes Yes  Some recent data might be hidden     Patient questions:  Do you have a fever, pain , or abdominal swelling? No. Pain Score  0 *  Have you tolerated food without any problems? Yes.    Have you been able to return to your normal activities? Yes.    Do you have any questions about your discharge instructions: Diet   No. Medications  No. Follow up visit  No.  Do you have questions or concerns about your Care? No.  Actions: * If pain score is 4 or above: No action needed, pain <4.  1. Have you developed a fever since your procedure? no  2.   Have you had an respiratory symptoms (SOB or cough) since your procedure? no  3.   Have you tested positive for COVID 19 since your procedure no  3.   Have you had any family members/close contacts diagnosed with the COVID 19 since your procedure?  no   If any of these questions are a yes, please inquire if patient has been seen by family doctor and route this note to Joylene John, Therapist, sports.

## 2018-07-29 DIAGNOSIS — L918 Other hypertrophic disorders of the skin: Secondary | ICD-10-CM | POA: Diagnosis not present

## 2018-07-29 DIAGNOSIS — L820 Inflamed seborrheic keratosis: Secondary | ICD-10-CM | POA: Diagnosis not present

## 2018-07-29 DIAGNOSIS — D225 Melanocytic nevi of trunk: Secondary | ICD-10-CM | POA: Diagnosis not present

## 2018-10-13 DIAGNOSIS — E785 Hyperlipidemia, unspecified: Secondary | ICD-10-CM | POA: Diagnosis not present

## 2018-10-13 DIAGNOSIS — D126 Benign neoplasm of colon, unspecified: Secondary | ICD-10-CM | POA: Diagnosis not present

## 2018-10-13 DIAGNOSIS — K210 Gastro-esophageal reflux disease with esophagitis: Secondary | ICD-10-CM | POA: Diagnosis not present

## 2018-10-13 DIAGNOSIS — D649 Anemia, unspecified: Secondary | ICD-10-CM | POA: Diagnosis not present

## 2018-10-13 DIAGNOSIS — C61 Malignant neoplasm of prostate: Secondary | ICD-10-CM | POA: Diagnosis not present

## 2018-10-13 DIAGNOSIS — I10 Essential (primary) hypertension: Secondary | ICD-10-CM | POA: Diagnosis not present

## 2018-10-13 DIAGNOSIS — D696 Thrombocytopenia, unspecified: Secondary | ICD-10-CM | POA: Diagnosis not present

## 2018-10-13 DIAGNOSIS — R7301 Impaired fasting glucose: Secondary | ICD-10-CM | POA: Diagnosis not present

## 2018-10-13 DIAGNOSIS — F39 Unspecified mood [affective] disorder: Secondary | ICD-10-CM | POA: Diagnosis not present

## 2019-01-15 DIAGNOSIS — Z8546 Personal history of malignant neoplasm of prostate: Secondary | ICD-10-CM | POA: Diagnosis not present

## 2019-01-22 DIAGNOSIS — N5201 Erectile dysfunction due to arterial insufficiency: Secondary | ICD-10-CM | POA: Diagnosis not present

## 2019-01-22 DIAGNOSIS — Z8546 Personal history of malignant neoplasm of prostate: Secondary | ICD-10-CM | POA: Diagnosis not present

## 2019-05-11 DIAGNOSIS — E7849 Other hyperlipidemia: Secondary | ICD-10-CM | POA: Diagnosis not present

## 2019-05-11 DIAGNOSIS — Z125 Encounter for screening for malignant neoplasm of prostate: Secondary | ICD-10-CM | POA: Diagnosis not present

## 2019-05-11 DIAGNOSIS — R7301 Impaired fasting glucose: Secondary | ICD-10-CM | POA: Diagnosis not present

## 2019-05-18 DIAGNOSIS — D696 Thrombocytopenia, unspecified: Secondary | ICD-10-CM | POA: Diagnosis not present

## 2019-05-18 DIAGNOSIS — D649 Anemia, unspecified: Secondary | ICD-10-CM | POA: Diagnosis not present

## 2019-05-18 DIAGNOSIS — Z0000 Encounter for general adult medical examination without abnormal findings: Secondary | ICD-10-CM | POA: Diagnosis not present

## 2019-05-18 DIAGNOSIS — D126 Benign neoplasm of colon, unspecified: Secondary | ICD-10-CM | POA: Diagnosis not present

## 2019-05-18 DIAGNOSIS — Z1331 Encounter for screening for depression: Secondary | ICD-10-CM | POA: Diagnosis not present

## 2019-05-18 DIAGNOSIS — M1990 Unspecified osteoarthritis, unspecified site: Secondary | ICD-10-CM | POA: Diagnosis not present

## 2019-05-18 DIAGNOSIS — R7301 Impaired fasting glucose: Secondary | ICD-10-CM | POA: Diagnosis not present

## 2019-05-18 DIAGNOSIS — C61 Malignant neoplasm of prostate: Secondary | ICD-10-CM | POA: Diagnosis not present

## 2019-05-18 DIAGNOSIS — E785 Hyperlipidemia, unspecified: Secondary | ICD-10-CM | POA: Diagnosis not present

## 2019-05-18 DIAGNOSIS — R82998 Other abnormal findings in urine: Secondary | ICD-10-CM | POA: Diagnosis not present

## 2019-05-18 DIAGNOSIS — I10 Essential (primary) hypertension: Secondary | ICD-10-CM | POA: Diagnosis not present

## 2019-05-18 DIAGNOSIS — K2100 Gastro-esophageal reflux disease with esophagitis, without bleeding: Secondary | ICD-10-CM | POA: Diagnosis not present

## 2019-05-18 DIAGNOSIS — F39 Unspecified mood [affective] disorder: Secondary | ICD-10-CM | POA: Diagnosis not present

## 2019-05-28 DIAGNOSIS — Z1212 Encounter for screening for malignant neoplasm of rectum: Secondary | ICD-10-CM | POA: Diagnosis not present

## 2019-08-03 ENCOUNTER — Other Ambulatory Visit: Payer: Self-pay | Admitting: Gastroenterology

## 2019-08-03 DIAGNOSIS — R195 Other fecal abnormalities: Secondary | ICD-10-CM

## 2019-08-03 DIAGNOSIS — K319 Disease of stomach and duodenum, unspecified: Secondary | ICD-10-CM

## 2019-08-03 DIAGNOSIS — D509 Iron deficiency anemia, unspecified: Secondary | ICD-10-CM

## 2019-08-03 DIAGNOSIS — D500 Iron deficiency anemia secondary to blood loss (chronic): Secondary | ICD-10-CM

## 2019-08-03 DIAGNOSIS — K449 Diaphragmatic hernia without obstruction or gangrene: Secondary | ICD-10-CM

## 2019-08-03 DIAGNOSIS — K209 Esophagitis, unspecified without bleeding: Secondary | ICD-10-CM

## 2019-10-24 ENCOUNTER — Other Ambulatory Visit: Payer: Self-pay | Admitting: Gastroenterology

## 2019-10-24 DIAGNOSIS — K319 Disease of stomach and duodenum, unspecified: Secondary | ICD-10-CM

## 2019-10-24 DIAGNOSIS — R195 Other fecal abnormalities: Secondary | ICD-10-CM

## 2019-10-24 DIAGNOSIS — K449 Diaphragmatic hernia without obstruction or gangrene: Secondary | ICD-10-CM

## 2019-10-24 DIAGNOSIS — D500 Iron deficiency anemia secondary to blood loss (chronic): Secondary | ICD-10-CM

## 2019-10-24 DIAGNOSIS — D509 Iron deficiency anemia, unspecified: Secondary | ICD-10-CM

## 2019-10-24 DIAGNOSIS — K209 Esophagitis, unspecified without bleeding: Secondary | ICD-10-CM

## 2019-11-06 ENCOUNTER — Other Ambulatory Visit: Payer: Self-pay | Admitting: Gastroenterology

## 2019-11-06 DIAGNOSIS — K319 Disease of stomach and duodenum, unspecified: Secondary | ICD-10-CM

## 2019-11-06 DIAGNOSIS — K209 Esophagitis, unspecified without bleeding: Secondary | ICD-10-CM

## 2019-11-06 DIAGNOSIS — K449 Diaphragmatic hernia without obstruction or gangrene: Secondary | ICD-10-CM

## 2019-11-06 DIAGNOSIS — D500 Iron deficiency anemia secondary to blood loss (chronic): Secondary | ICD-10-CM

## 2019-11-06 DIAGNOSIS — D509 Iron deficiency anemia, unspecified: Secondary | ICD-10-CM

## 2019-11-06 DIAGNOSIS — R195 Other fecal abnormalities: Secondary | ICD-10-CM

## 2019-11-16 ENCOUNTER — Other Ambulatory Visit: Payer: Self-pay | Admitting: Internal Medicine

## 2019-11-16 ENCOUNTER — Other Ambulatory Visit (HOSPITAL_COMMUNITY): Payer: Self-pay | Admitting: Internal Medicine

## 2019-11-16 DIAGNOSIS — E785 Hyperlipidemia, unspecified: Secondary | ICD-10-CM | POA: Diagnosis not present

## 2019-11-16 DIAGNOSIS — R7301 Impaired fasting glucose: Secondary | ICD-10-CM | POA: Diagnosis not present

## 2019-11-16 DIAGNOSIS — I10 Essential (primary) hypertension: Secondary | ICD-10-CM | POA: Diagnosis not present

## 2019-11-16 DIAGNOSIS — R011 Cardiac murmur, unspecified: Secondary | ICD-10-CM

## 2019-11-16 DIAGNOSIS — R002 Palpitations: Secondary | ICD-10-CM | POA: Diagnosis not present

## 2019-11-16 DIAGNOSIS — R946 Abnormal results of thyroid function studies: Secondary | ICD-10-CM | POA: Diagnosis not present

## 2019-11-16 DIAGNOSIS — D649 Anemia, unspecified: Secondary | ICD-10-CM | POA: Diagnosis not present

## 2019-11-16 DIAGNOSIS — D696 Thrombocytopenia, unspecified: Secondary | ICD-10-CM | POA: Diagnosis not present

## 2019-11-16 DIAGNOSIS — C61 Malignant neoplasm of prostate: Secondary | ICD-10-CM | POA: Diagnosis not present

## 2019-11-16 DIAGNOSIS — K2100 Gastro-esophageal reflux disease with esophagitis, without bleeding: Secondary | ICD-10-CM | POA: Diagnosis not present

## 2019-11-16 DIAGNOSIS — Z23 Encounter for immunization: Secondary | ICD-10-CM | POA: Diagnosis not present

## 2019-11-16 DIAGNOSIS — F39 Unspecified mood [affective] disorder: Secondary | ICD-10-CM | POA: Diagnosis not present

## 2019-11-22 ENCOUNTER — Ambulatory Visit
Admission: RE | Admit: 2019-11-22 | Discharge: 2019-11-22 | Disposition: A | Discharge status: Home / Self Care | Payer: PPO | Source: Ambulatory Visit | Attending: Internal Medicine | Admitting: Internal Medicine

## 2019-11-22 DIAGNOSIS — R946 Abnormal results of thyroid function studies: Secondary | ICD-10-CM

## 2019-11-22 DIAGNOSIS — E041 Nontoxic single thyroid nodule: Secondary | ICD-10-CM | POA: Diagnosis not present

## 2019-12-08 ENCOUNTER — Ambulatory Visit (HOSPITAL_COMMUNITY): Payer: PPO | Attending: Cardiovascular Disease

## 2019-12-08 ENCOUNTER — Other Ambulatory Visit: Payer: Self-pay

## 2019-12-08 DIAGNOSIS — R011 Cardiac murmur, unspecified: Secondary | ICD-10-CM | POA: Insufficient documentation

## 2019-12-08 LAB — ECHOCARDIOGRAM COMPLETE
Area-P 1/2: 3.19 cm2
S' Lateral: 3.9 cm

## 2019-12-09 NOTE — H&P (View-Only) (Signed)
Cardiology Office Note:   Date:  12/10/2019  NAME:  Tyler [name]    MRN: [id number] DOB:  14-Dec-1950   PCP:  Prince Solian, MD  Cardiologist:  No primary care provider on file.   Referring MD: Prince Solian, MD   Chief Complaint  Patient presents with  . Heart Murmur   History of Present Illness:   Tyler [name] is a 69 y.o. male with a hx of HTN, anxiety, colonic varies who is being seen today for the evaluation of mitral regurgitation at the request of Avva, Ravisankar, MD.  He was recently found to have severe mitral regurgitation on transthoracic echo.  Echo was ordered for new murmur.  He reports a medical history of hypertension and colonic varices.  He also has that anxiety depression that is treated.  He is never had a heart attack or stroke.  No heart problems in the past.  Blood pressures well controlled 132/78.  He also reports low platelets.  Apparently this is been stable worked up in the past.  No identifiable etiology.  He has no bleeding issues.  He does surveillance colonoscopies without any major issues.  He reports he does not exercise routinely but has no limitations doing yard work or cutting grass.  He is not noticed any increase shortness of breath.  He has no chest pain.  He describes no lower extremity edema.  His EKG today demonstrates normal sinus rhythm, heart rate 77.  There are no acute ischemic changes noted.  No strong family history of heart disease.  He is a never smoker.  He does consume alcohol moderation.  No illicit drug use reported.  He is a retired Dance movement psychotherapist.  He presents with his wife today.  Past Medical History: Past Medical History:  Diagnosis Date  . Adenomatous polyp of colon 02/2004  . Anemia   . Anxiety   . Arthritis   . BPH (benign prostatic hyperplasia)   . Colonic varices   . Degenerative joint disease   . Depression   . Depression   . Fatty liver   . Hemorrhoids   . Hyperlipidemia   . Hypertension   . IBS  (irritable bowel syndrome)   . Prostate cancer ([place]) 2011  . Thrombocytopenia ([place])     Past Surgical History: Past Surgical History:  Procedure Laterality Date  . COLONOSCOPY    . PROSTATECTOMY    . TONSILLECTOMY    . TYMPANOPLASTY  01/2016    Current Medications: Current Meds  Medication Sig  . aspirin 81 MG tablet Take 81 mg by mouth daily.  Marland Kitchen buPROPion (WELLBUTRIN XL) 150 MG 24 hr tablet Take 150 mg by mouth daily.  Marland Kitchen dicyclomine (BENTYL) 10 MG capsule TAKE 1 CAPSULE BY MOUTH 3 TIMES DAILY AS NEEDED FOR SPASMS (ABD PAIN AND CRAMPING).  Marland Kitchen escitalopram (LEXAPRO) 5 MG tablet Take 5 mg by mouth daily.  . irbesartan (AVAPRO) 150 MG tablet Takes one capsule by mouth once daily  . pantoprazole (PROTONIX) 40 MG tablet TAKE 1 TABLET BY MOUTH EVERY DAY  . psyllium (METAMUCIL) 58.6 % packet Take 1 packet by mouth daily.  . traZODone (DESYREL) 50 MG tablet Take 50 mg by mouth at bedtime.   Current Facility-Administered Medications for the 12/10/19 encounter (Office Visit) with Geralynn Rile, MD  Medication  . 0.9 %  sodium chloride infusion     Allergies:    Patient has no known allergies.   Social History: Social History   Socioeconomic  History  . Marital status: Married    Spouse name: Not on file  . Number of children: 3  . Years of education: Not on file  . Highest education level: Not on file  Occupational History  . Occupation: Chemical engineer: [employer]  Tobacco Use  . Smoking status: Never Smoker  . Smokeless tobacco: Never Used  Vaping Use  . Vaping Use: Never used  Substance and Sexual Activity  . Alcohol use: Yes    Comment: occ  . Drug use: No  . Sexual activity: Not on file  Other Topics Concern  . Not on file  Social History Narrative  . Not on file   Social Determinants of Health   Financial Resource Strain:   . Difficulty of Paying Living Expenses: Not on file  Food Insecurity:   . Worried About Charity fundraiser  in the Last Year: Not on file  . Ran Out of Food in the Last Year: Not on file  Transportation Needs:   . Lack of Transportation (Medical): Not on file  . Lack of Transportation (Non-Medical): Not on file  Physical Activity:   . Days of Exercise per Week: Not on file  . Minutes of Exercise per Session: Not on file  Stress:   . Feeling of Stress : Not on file  Social Connections:   . Frequency of Communication with Friends and Family: Not on file  . Frequency of Social Gatherings with Friends and Family: Not on file  . Attends Religious Services: Not on file  . Active Member of Clubs or Organizations: Not on file  . Attends Archivist Meetings: Not on file  . Marital Status: Not on file     Family History: The patient's family history includes Colon polyps in his father; Depression in an other family member; Heart disease in his father and mother; Stroke in his brother. There is no history of Colon cancer, Esophageal cancer, Stomach cancer, or Celiac disease.  ROS:   All other ROS reviewed and negative. Pertinent positives noted in the HPI.     EKGs/Labs/Other Studies Reviewed:   The following studies were personally reviewed by me today:  EKG:  EKG is ordered today.  The ekg ordered today demonstrates normal sinus rhythm, heart rate 77, no acute ischemic changes, no evidence of prior infarct, and was personally reviewed by me.   TTE 12/08/2019 1. There is severe MR that is directed posteriorly and laterally. There  is splay artifact. Suspect either flail segment that may be commissural  given rather eccentric jet. TEE is recommended for better  characterization. The mitral valve is myxomatous.  Severe mitral valve regurgitation. No evidence of mitral stenosis.  2. Left ventricular ejection fraction, by estimation, is 60 to 65%. The  left ventricle has normal function. The left ventricle has no regional  wall motion abnormalities. The left ventricular internal cavity  size was  mildly dilated. Left ventricular  diastolic function could not be evaluated.  3. Right ventricular systolic function is normal. The right ventricular  size is normal. Tricuspid regurgitation signal is inadequate for assessing  PA pressure.  4. Left atrial size was mildly dilated.  5. The aortic valve is tricuspid. Aortic valve regurgitation is not  visualized. No aortic stenosis is present.  6. The inferior vena cava is normal in size with greater than 50%  respiratory variability, suggesting right atrial pressure of 3 mmHg.   Recent Labs: No results found for  requested labs within last 8760 hours.   Recent Lipid Panel No results found for: CHOL, TRIG, HDL, CHOLHDL, VLDL, LDLCALC, LDLDIRECT  Physical Exam:   VS:  BP 132/78   Pulse 77   Ht 5\' 9"  (1.753 m)   Wt 169 lb 6.4 oz (76.8 kg)   SpO2 98%   BMI 25.02 kg/m    Wt Readings from Last 3 Encounters:  12/10/19 169 lb 6.4 oz (76.8 kg)  07/14/18 175 lb (79.4 kg)  05/05/18 175 lb (79.4 kg)    General: Well nourished, well developed, in no acute distress Heart: Atraumatic, normal size  Eyes: PEERLA, EOMI  Neck: Supple, no JVD Endocrine: No thryomegaly Cardiac: Normal S1, S2; loud 3 out of 6 holosystolic murmur Lungs: Clear to auscultation bilaterally, no wheezing, rhonchi or rales  Abd: Soft, nontender, no hepatomegaly  Ext: No edema, pulses 2+ Musculoskeletal: No deformities, BUE and BLE strength normal and equal Skin: Warm and dry, no rashes   Neuro: Alert and oriented to person, place, time, and situation, CNII-XII grossly intact, no focal deficits  Psych: Normal mood and affect   ASSESSMENT:   Tyler [name] is a 69 y.o. male who presents for the following: 1. Nonrheumatic mitral valve regurgitation   2. Primary hypertension     PLAN:   1. Nonrheumatic mitral valve regurgitation -Echo with severe mitral regurgitation.  I suspect there is a commissural flail segment.  The jet was very eccentric and  difficult to get a good look at.  The jet is posteriorly directed.  Possibly this is an A3, she will fail.  No symptoms but LV is mildly dilated.  He needs this repaired. -We will proceed with transesophageal echo as well as left and right heart cath on 12/22/2019.  He has no symptoms yet but given his LV dilation he needs this fixed.  We will check CMP, CBC, and BNP.  He will see me back in 3 months.  We will likely persist back after we get more information about his valve. -There is a history of colonic varices but his hemoglobin has been stable.  We will confirm on CBC.  He is also had thrombocytopenia for years.  Recheck today.  I don't see this is an issue for cardiac surgery as long as the platelets are above 50,000.  2. Primary hypertension -Well-controlled on current medications.  Disposition: Return in about 3 months (around 03/11/2020).  Medication Adjustments/Labs and Tests Ordered: Current medicines are reviewed at length with the patient today.  Concerns regarding medicines are outlined above.  Orders Placed This Encounter  Procedures  . CBC  . Comprehensive metabolic panel  . Brain natriuretic peptide   No orders of the defined types were placed in this encounter.   Patient Instructions  Medication Instructions:  The current medical regimen is effective;  continue present plan and medications.   *If you need a refill on your cardiac medications before your next appointment, please call your pharmacy*   Lab Work: CBC, CMET, BNP today  COVID TESTING 10/18- 10:45 AM- [place], [place] [address].  If you have labs (blood work) drawn today and your tests are completely normal, you will receive your results only by: Marland Kitchen MyChart Message (if you have MyChart) OR . A paper copy in the mail If you have any lab test that is abnormal or we need to change your treatment, we will call you to review the results.   Testing/Procedures: Your physician has requested that  you have a cardiac catheterization. Cardiac catheterization is used to diagnose and/or treat various heart conditions. Doctors may recommend this procedure for a number of different reasons. The most common reason is to evaluate chest pain. Chest pain can be a symptom of coronary artery disease (CAD), and cardiac catheterization can show whether plaque is narrowing or blocking your heart's arteries. This procedure is also used to evaluate the valves, as well as measure the blood flow and oxygen levels in different parts of your heart. For further information please visit [email or web address]. Please follow instruction sheet, as given.  Your physician has requested that you have a TEE. During a TEE, sound waves are used to create images of your heart. It provides your doctor with information about the size and shape of your heart and how well your heart's chambers and valves are working. In this test, a transducer is attached to the end of a flexible tube that's guided down your throat and into your esophagus (the tube leading from you mouth to your stomach) to get a more detailed image of your heart. You are not awake for the procedure. Please see the instruction sheet given to you today. For further information please visit [email or web address].    Follow-Up: At [facility], you and your health needs are our priority.  As part of our continuing mission to provide you with exceptional heart care, we have created designated Provider Care Teams.  These Care Teams include your primary Cardiologist (physician) and Advanced Practice Providers (APPs -  Physician Assistants and Nurse Practitioners) who all work together to provide you with the care you need, when you need it.  We recommend signing up for the patient portal called "MyChart".  Sign up information is provided on this After Visit Summary.  MyChart is used to connect with patients for Virtual Visits (Telemedicine).  Patients are able to view lab/test  results, encounter notes, upcoming appointments, etc.  Non-urgent messages can be sent to your provider as well.   To learn more about what you can do with MyChart, go to [email or web address].    Your next appointment:   3 month(s)  The format for your next appointment:   In Person  Provider:   Eleonore Chiquito, MD   Other Instructions     [place] [address] Dept: [phone] Loc: [facility]  12/10/2019  You are scheduled for a Cardiac Catheterization on Wednesday, October 20 with Dr. [name].  1. Please arrive at the [facility] (Main Entrance A) at [facility]: [address] [place], [place] [address] at 2:00 PM (This time is two hours before your procedure to ensure your preparation). Free valet parking service is available.   Special note: Every effort is made to have your procedure done on time. Please understand that emergencies sometimes delay scheduled procedures.  2. Diet: Do not eat solid foods after midnight.  The patient may have clear liquids until 5am upon the day of the procedure.  3. Labs: You will need to have blood drawn today, CBC, CMET today. You do not need to be fasting.  4. Medication instructions in preparation for your procedure:   Contrast Allergy: No  On the morning of your procedure, take your Aspirin and any morning medicines NOT listed above.  You may use sips of water.  5. Plan for one night stay--bring personal belongings. 6. Bring a current list of  your medications and current insurance cards. 7. You MUST have a responsible person to drive you home. 8. Someone MUST be with you the first 24 hours after you arrive home or your discharge will be delayed. 9. Please wear clothes that are easy to get on and off and wear slip-on shoes.  Thank you for allowing [place] to care for you!   -- [facility]  Invasive Cardiovascular services     You are scheduled for a TEE/Cardioversion/TEE Cardioversion on October 20th with Dr. Audie Box.  Please arrive at the [facility] (Main Entrance A) at [facility]: [place], [place] [address] at 12 am/pm. (1 hour prior to procedure unless lab work is needed; if lab work is needed arrive 1.5 hours ahead)  DIET: Nothing to eat or drink after midnight except a sip of water with medications (see medication instructions below)  Medication Instructions:  You will need to continue your anticoagulant after your procedure until you  are told by your  Provider that it is safe to stop   Labs: CMET, CBC today   You must have a responsible person to drive you home and stay in the waiting area during your procedure. Failure to do so could result in cancellation.  Bring your insurance cards.  *Special Note: Every effort is made to have your procedure done on time. Occasionally there are emergencies that occur at the hospital that may cause delays. Please be patient if a delay does occur.      Signed, Addison Naegeli. Audie Box, [place]  [address] [place], [place] [address] [phone]  12/10/2019 12:13 PM

## 2019-12-09 NOTE — Progress Notes (Signed)
Cardiology Office Note:   Date:  12/10/2019  NAME:  Tyler [name]    MRN: [id number] DOB:  05-17-1950   PCP:  Prince Solian, MD  Cardiologist:  No primary care provider on file.   Referring MD: Prince Solian, MD   Chief Complaint  Patient presents with   Heart Murmur   History of Present Illness:   Tyler [name] is a 69 y.o. male with a hx of HTN, anxiety, colonic varies who is being seen today for the evaluation of mitral regurgitation at the request of Avva, Ravisankar, MD.  He was recently found to have severe mitral regurgitation on transthoracic echo.  Echo was ordered for new murmur.  He reports a medical history of hypertension and colonic varices.  He also has that anxiety depression that is treated.  He is never had a heart attack or stroke.  No heart problems in the past.  Blood pressures well controlled 132/78.  He also reports low platelets.  Apparently this is been stable worked up in the past.  No identifiable etiology.  He has no bleeding issues.  He does surveillance colonoscopies without any major issues.  He reports he does not exercise routinely but has no limitations doing yard work or cutting grass.  He is not noticed any increase shortness of breath.  He has no chest pain.  He describes no lower extremity edema.  His EKG today demonstrates normal sinus rhythm, heart rate 77.  There are no acute ischemic changes noted.  No strong family history of heart disease.  He is a never smoker.  He does consume alcohol moderation.  No illicit drug use reported.  He is a retired Dance movement psychotherapist.  He presents with his wife today.  Past Medical History: Past Medical History:  Diagnosis Date   Adenomatous polyp of colon 02/2004   Anemia    Anxiety    Arthritis    BPH (benign prostatic hyperplasia)    Colonic varices    Degenerative joint disease    Depression    Depression    Fatty liver    Hemorrhoids    Hyperlipidemia    Hypertension    IBS  (irritable bowel syndrome)    Prostate cancer ([place]) 2011   Thrombocytopenia ([place])     Past Surgical History: Past Surgical History:  Procedure Laterality Date   COLONOSCOPY     PROSTATECTOMY     TONSILLECTOMY     TYMPANOPLASTY  01/2016    Current Medications: Current Meds  Medication Sig   aspirin 81 MG tablet Take 81 mg by mouth daily.   buPROPion (WELLBUTRIN XL) 150 MG 24 hr tablet Take 150 mg by mouth daily.   dicyclomine (BENTYL) 10 MG capsule TAKE 1 CAPSULE BY MOUTH 3 TIMES DAILY AS NEEDED FOR SPASMS (ABD PAIN AND CRAMPING).   escitalopram (LEXAPRO) 5 MG tablet Take 5 mg by mouth daily.   irbesartan (AVAPRO) 150 MG tablet Takes one capsule by mouth once daily   pantoprazole (PROTONIX) 40 MG tablet TAKE 1 TABLET BY MOUTH EVERY DAY   psyllium (METAMUCIL) 58.6 % packet Take 1 packet by mouth daily.   traZODone (DESYREL) 50 MG tablet Take 50 mg by mouth at bedtime.   Current Facility-Administered Medications for the 12/10/19 encounter (Office Visit) with Geralynn Rile, MD  Medication   0.9 %  sodium chloride infusion     Allergies:    Patient has no known allergies.   Social History: Social History   Socioeconomic  History   Marital status: Married    Spouse name: Not on file   Number of children: 3   Years of education: Not on file   Highest education level: Not on file  Occupational History   Occupation: Dance movement psychotherapist    Employer: [employer]  Tobacco Use   Smoking status: Never Smoker   Smokeless tobacco: Never Used  Scientific laboratory technician Use: Never used  Substance and Sexual Activity   Alcohol use: Yes    Comment: occ   Drug use: No   Sexual activity: Not on file  Other Topics Concern   Not on file  Social History Narrative   Not on file   Social Determinants of Health   Financial Resource Strain:    Difficulty of Paying Living Expenses: Not on file  Food Insecurity:    Worried About Charity fundraiser  in the Last Year: Not on file   [employer] of Food in the Last Year: Not on file  Transportation Needs:    Lack of Transportation (Medical): Not on file   Lack of Transportation (Non-Medical): Not on file  Physical Activity:    Days of Exercise per Week: Not on file   Minutes of Exercise per Session: Not on file  Stress:    Feeling of Stress : Not on file  Social Connections:    Frequency of Communication with Friends and Family: Not on file   Frequency of Social Gatherings with Friends and Family: Not on file   Attends Religious Services: Not on file   Active Member of Clubs or Organizations: Not on file   Attends Archivist Meetings: Not on file   Marital Status: Not on file     Family History: The patient's family history includes Colon polyps in his father; Depression in an other family member; Heart disease in his father and mother; Stroke in his brother. There is no history of Colon cancer, Esophageal cancer, Stomach cancer, or Celiac disease.  ROS:   All other ROS reviewed and negative. Pertinent positives noted in the HPI.     EKGs/Labs/Other Studies Reviewed:   The following studies were personally reviewed by me today:  EKG:  EKG is ordered today.  The ekg ordered today demonstrates normal sinus rhythm, heart rate 77, no acute ischemic changes, no evidence of prior infarct, and was personally reviewed by me.   TTE 12/08/2019 1. There is severe MR that is directed posteriorly and laterally. There  is splay artifact. Suspect either flail segment that may be commissural  given rather eccentric jet. TEE is recommended for better  characterization. The mitral valve is myxomatous.  Severe mitral valve regurgitation. No evidence of mitral stenosis.  2. Left ventricular ejection fraction, by estimation, is 60 to 65%. The  left ventricle has normal function. The left ventricle has no regional  wall motion abnormalities. The left ventricular internal cavity  size was  mildly dilated. Left ventricular  diastolic function could not be evaluated.  3. Right ventricular systolic function is normal. The right ventricular  size is normal. Tricuspid regurgitation signal is inadequate for assessing  PA pressure.  4. Left atrial size was mildly dilated.  5. The aortic valve is tricuspid. Aortic valve regurgitation is not  visualized. No aortic stenosis is present.  6. The inferior vena cava is normal in size with greater than 50%  respiratory variability, suggesting right atrial pressure of 3 mmHg.   Recent Labs: No results found for  requested labs within last 8760 hours.   Recent Lipid Panel No results found for: CHOL, TRIG, HDL, CHOLHDL, VLDL, LDLCALC, LDLDIRECT  Physical Exam:   VS:  BP 132/78    Pulse 77    Ht 5\' 9"  (1.753 m)    Wt 169 lb 6.4 oz (76.8 kg)    SpO2 98%    BMI 25.02 kg/m    Wt Readings from Last 3 Encounters:  12/10/19 169 lb 6.4 oz (76.8 kg)  07/14/18 175 lb (79.4 kg)  05/05/18 175 lb (79.4 kg)    General: Well nourished, well developed, in no acute distress Heart: Atraumatic, normal size  Eyes: PEERLA, EOMI  Neck: Supple, no JVD Endocrine: No thryomegaly Cardiac: Normal S1, S2; loud 3 out of 6 holosystolic murmur Lungs: Clear to auscultation bilaterally, no wheezing, rhonchi or rales  Abd: Soft, nontender, no hepatomegaly  Ext: No edema, pulses 2+ Musculoskeletal: No deformities, BUE and BLE strength normal and equal Skin: Warm and dry, no rashes   Neuro: Alert and oriented to person, place, time, and situation, CNII-XII grossly intact, no focal deficits  Psych: Normal mood and affect   ASSESSMENT:   Tyler [name] is a 69 y.o. male who presents for the following: 1. Nonrheumatic mitral valve regurgitation   2. Primary hypertension     PLAN:   1. Nonrheumatic mitral valve regurgitation -Echo with severe mitral regurgitation.  I suspect there is a commissural flail segment.  The jet was very eccentric and  difficult to get a good look at.  The jet is posteriorly directed.  Possibly this is an A3, she will fail.  No symptoms but LV is mildly dilated.  He needs this repaired. -We will proceed with transesophageal echo as well as left and right heart cath on 12/22/2019.  He has no symptoms yet but given his LV dilation he needs this fixed.  We will check CMP, CBC, and BNP.  He will see me back in 3 months.  We will likely persist back after we get more information about his valve. -There is a history of colonic varices but his hemoglobin has been stable.  We will confirm on CBC.  He is also had thrombocytopenia for years.  Recheck today.  I don't see this is an issue for cardiac surgery as long as the platelets are above 50,000.  2. Primary hypertension -Well-controlled on current medications.  Disposition: Return in about 3 months (around 03/11/2020).  Medication Adjustments/Labs and Tests Ordered: Current medicines are reviewed at length with the patient today.  Concerns regarding medicines are outlined above.  Orders Placed This Encounter  Procedures   CBC   Comprehensive metabolic panel   Brain natriuretic peptide   No orders of the defined types were placed in this encounter.   Patient Instructions  Medication Instructions:  The current medical regimen is effective;  continue present plan and medications.   *If you need a refill on your cardiac medications before your next appointment, please call your pharmacy*   Lab Work: CBC, CMET, BNP today  COVID TESTING 10/18- 10:45 AM- [place], [place] [address].  If you have labs (blood work) drawn today and your tests are completely normal, you will receive your results only by:  [place] (if you have MyChart) OR  A paper copy in the mail If you have any lab test that is abnormal or we need to change your treatment, we will call you to review the results.   Testing/Procedures:  Your physician has requested that  you have a cardiac catheterization. Cardiac catheterization is used to diagnose and/or treat various heart conditions. Doctors may recommend this procedure for a number of different reasons. The most common reason is to evaluate chest pain. Chest pain can be a symptom of coronary artery disease (CAD), and cardiac catheterization can show whether plaque is narrowing or blocking your hearts arteries. This procedure is also used to evaluate the valves, as well as measure the blood flow and oxygen levels in different parts of your heart. For further information please visit [email or web address].[email or web address]. Please follow instruction sheet, as given.  Your physician has requested that you have a TEE. During a TEE, sound waves are used to create images of your heart. It provides your doctor with information about the size and shape of your heart and how well your hearts chambers and valves are working. In this test, a transducer is attached to the end of a flexible tube thats guided down your throat and into your esophagus (the tube leading from you mouth to your stomach) to get a more detailed image of your heart. You are not awake for the procedure. Please see the instruction sheet given to you today. For further information please visit [email or web address].    Follow-Up: At [facility], you and your health needs are our priority.  As part of our continuing mission to provide you with exceptional heart care, we have created designated Provider Care Teams.  These Care Teams include your primary Cardiologist (physician) and Advanced Practice Providers (APPs -  Physician Assistants and Nurse Practitioners) who all work together to provide you with the care you need, when you need it.  We recommend signing up for the patient portal called "MyChart".  Sign up information is provided on this After Visit Summary.  MyChart is used to connect with patients for Virtual Visits (Telemedicine).  Patients are able to view lab/test  results, encounter notes, upcoming appointments, etc.  Non-urgent messages can be sent to your provider as well.   To learn more about what you can do with MyChart, go to [email or web address].    Your next appointment:   3 month(s)  The format for your next appointment:   In Person  Provider:   Eleonore Chiquito, MD   Other Instructions     [place] [address] Dept: [phone] Loc: [place]  12/10/2019  You are scheduled for a Cardiac Catheterization on Wednesday, October 20 with Dr. [name].  1. Please arrive at the [facility] (Main Entrance A) at [facility]: [address] [place], [place] [address] at 2:00 PM (This time is two hours before your procedure to ensure your preparation). Free valet parking service is available.   Special note: Every effort is made to have your procedure done on time. Please understand that emergencies sometimes delay scheduled procedures.  2. Diet: Do not eat solid foods after midnight.  The patient may have clear liquids until 5am upon the day of the procedure.  3. Labs: You will need to have blood drawn today, CBC, CMET today. You do not need to be fasting.  4. Medication instructions in preparation for your procedure:   Contrast Allergy: No  On the morning of your procedure, take your Aspirin and any morning medicines NOT listed above.  You may use sips of water.  5. Plan for one night stay--bring personal belongings.  6. Bring a current list of your medications and current insurance cards. 7. You MUST have a responsible person to drive you home. 8. Someone MUST be with you the first 24 hours after you arrive home or your discharge will be delayed. 9. Please wear clothes that are easy to get on and off and wear slip-on shoes.  Thank you for allowing [place] to care for you!   -- [place]  Invasive Cardiovascular services     You are scheduled for a TEE/Cardioversion/TEE Cardioversion on October 20th with Dr. Audie Box.  Please arrive at the [facility] (Main Entrance A) at [facility]: [place], [place] [address] at 12 am/pm. (1 hour prior to procedure unless lab work is needed; if lab work is needed arrive 1.5 hours ahead)  DIET: Nothing to eat or drink after midnight except a sip of water with medications (see medication instructions below)  Medication Instructions:  You will need to continue your anticoagulant after your procedure until you  are told by your  Provider that it is safe to stop   Labs: CMET, CBC today   You must have a responsible person to drive you home and stay in the waiting area during your procedure. Failure to do so could result in cancellation.  Bring your insurance cards.  *Special Note: Every effort is made to have your procedure done on time. Occasionally there are emergencies that occur at the hospital that may cause delays. Please be patient if a delay does occur.      Signed, Addison Naegeli. Audie Box, [place]  [address] [place], [place] [address] [phone]  12/10/2019 12:13 PM

## 2019-12-10 ENCOUNTER — Ambulatory Visit: Payer: PPO | Admitting: Cardiovascular Disease

## 2019-12-10 ENCOUNTER — Other Ambulatory Visit: Payer: Self-pay

## 2019-12-10 ENCOUNTER — Encounter: Payer: Self-pay | Admitting: Cardiovascular Disease

## 2019-12-10 VITALS — BP 132/78 | HR 77 | Ht 69.0 in | Wt 169.4 lb

## 2019-12-10 DIAGNOSIS — I340 Nonrheumatic mitral (valve) insufficiency: Secondary | ICD-10-CM

## 2019-12-10 DIAGNOSIS — I10 Essential (primary) hypertension: Secondary | ICD-10-CM | POA: Diagnosis not present

## 2019-12-10 LAB — CBC

## 2019-12-10 NOTE — Patient Instructions (Addendum)
Medication Instructions:  The current medical regimen is effective;  continue present plan and medications.   *If you need a refill on your cardiac medications before your next appointment, please call your pharmacy*   Lab Work: CBC, CMET, BNP today  COVID TESTING 10/18- 10:45 AM- [place], [place] [address].  If you have labs (blood work) drawn today and your tests are completely normal, you will receive your results only by: [place] MyChart Message (if you have MyChart) OR . A paper copy in the mail If you have any lab test that is abnormal or we need to change your treatment, we will call you to review the results.   Testing/Procedures: Your physician has requested that you have a cardiac catheterization. Cardiac catheterization is used to diagnose and/or treat various heart conditions. Doctors may recommend this procedure for a number of different reasons. The most common reason is to evaluate chest pain. Chest pain can be a symptom of coronary artery disease (CAD), and cardiac catheterization can show whether plaque is narrowing or blocking your heart's arteries. This procedure is also used to evaluate the valves, as well as measure the blood flow and oxygen levels in different parts of your heart. For further information please visit [email or web address]. Please follow instruction sheet, as given.  Your physician has requested that you have a TEE. During a TEE, sound waves are used to create images of your heart. It provides your doctor with information about the size and shape of your heart and how well your heart's chambers and valves are working. In this test, a transducer is attached to the end of a flexible tube that's guided down your throat and into your esophagus (the tube leading from you mouth to your stomach) to get a more detailed image of your heart. You are not awake for the procedure. Please see the instruction sheet given to you today. For further information please  visit [email or web address].    Follow-Up: At [facility], you and your health needs are our priority.  As part of our continuing mission to provide you with exceptional heart care, we have created designated Provider Care Teams.  These Care Teams include your primary Cardiologist (physician) and Advanced Practice Providers (APPs -  Physician Assistants and Nurse Practitioners) who all work together to provide you with the care you need, when you need it.  We recommend signing up for the patient portal called "MyChart".  Sign up information is provided on this After Visit Summary.  MyChart is used to connect with patients for Virtual Visits (Telemedicine).  Patients are able to view lab/test results, encounter notes, upcoming appointments, etc.  Non-urgent messages can be sent to your provider as well.   To learn more about what you can do with MyChart, go to [email or web address].    Your next appointment:   3 month(s)  The format for your next appointment:   In Person  Provider:   Eleonore Chiquito, MD   Other Instructions     [place] [address] Dept: [phone] Loc: [place]  12/10/2019  You are scheduled for a Cardiac Catheterization on Wednesday, October 20 with Dr. [name].  1. Please arrive at the [facility] (Main Entrance A) at [facility]: [address] [place], [place] [address] at 2:00 PM (This time is two hours before your procedure to ensure your preparation). Free valet parking service  is available.   Special note: Every effort is made to have your procedure done on time. Please understand that emergencies sometimes delay scheduled procedures.  2. Diet: Do not eat solid foods after midnight.  The patient may have clear liquids until 5am upon the day of the procedure.  3. Labs: You will need to have blood drawn  today, CBC, CMET today. You do not need to be fasting.  4. Medication instructions in preparation for your procedure:   Contrast Allergy: No  On the morning of your procedure, take your Aspirin and any morning medicines NOT listed above.  You may use sips of water.  5. Plan for one night stay--bring personal belongings. 6. Bring a current list of your medications and current insurance cards. 7. You MUST have a responsible person to drive you home. 8. Someone MUST be with you the first 24 hours after you arrive home or your discharge will be delayed. 9. Please wear clothes that are easy to get on and off and wear slip-on shoes.  Thank you for allowing [place] to care for you!   -- [place] Invasive Cardiovascular services     You are scheduled for a TEE/Cardioversion/TEE Cardioversion on October 20th with Dr. Audie Box.  Please arrive at the [facility] (Main Entrance A) at [facility]: [place], [place] [address] at 12 am/pm. (1 hour prior to procedure unless lab work is needed; if lab work is needed arrive 1.5 hours ahead)  DIET: Nothing to eat or drink after midnight except a sip of water with medications (see medication instructions below)  Medication Instructions:  You will need to continue your anticoagulant after your procedure until you  are told by your  Provider that it is safe to stop   Labs: CMET, CBC today   You must have a responsible person to drive you home and stay in the waiting area during your procedure. Failure to do so could result in cancellation.  Bring your insurance cards.  *Special Note: Every effort is made to have your procedure done on time. Occasionally there are emergencies that occur at the hospital that may cause delays. Please be patient if a delay does occur.

## 2019-12-11 LAB — CBC
Hematocrit: 39.9 % (ref 37.5–51.0)
Hemoglobin: 14 g/dL (ref 13.0–17.7)
MCH: 31 pg (ref 26.6–33.0)
MCHC: 35.1 g/dL (ref 31.5–35.7)
MCV: 89 fL (ref 79–97)
RBC: 4.51 x10E6/uL (ref 4.14–5.80)
RDW: 11.8 % (ref 11.6–15.4)
WBC: 9.1 10*3/uL (ref 3.4–10.8)

## 2019-12-11 LAB — COMPREHENSIVE METABOLIC PANEL
ALT: 26 IU/L (ref 0–44)
AST: 24 IU/L (ref 0–40)
Albumin/Globulin Ratio: 2.3 — ABNORMAL HIGH (ref 1.2–2.2)
Albumin: 4.4 g/dL (ref 3.7–4.7)
Alkaline Phosphatase: 67 IU/L (ref 44–121)
BUN/Creatinine Ratio: 13 (ref 10–24)
BUN: 11 mg/dL (ref 8–27)
Bilirubin Total: 0.6 mg/dL (ref 0.0–1.2)
CO2: 25 mmol/L (ref 20–29)
Calcium: 9.4 mg/dL (ref 8.6–10.2)
Chloride: 103 mmol/L (ref 96–106)
Creatinine, Ser: 0.84 mg/dL (ref 0.76–1.27)
GFR calc Af Amer: 102 mL/min/{1.73_m2} (ref >59)
GFR calc non Af Amer: 88 mL/min/{1.73_m2} (ref >59)
Globulin, Total: 1.9 g/dL (ref 1.5–4.5)
Glucose: 94 mg/dL (ref 65–99)
Potassium: 4.7 mmol/L (ref 3.5–5.2)
Sodium: 141 mmol/L (ref 134–144)
Total Protein: 6.3 g/dL (ref 6.0–8.5)

## 2019-12-11 LAB — BRAIN NATRIURETIC PEPTIDE: BNP: 14.9 pg/mL (ref 0.0–100.0)

## 2019-12-14 ENCOUNTER — Telehealth: Payer: Self-pay | Admitting: Cardiovascular Disease

## 2019-12-14 DIAGNOSIS — Z01812 Encounter for preprocedural laboratory examination: Secondary | ICD-10-CM

## 2019-12-14 NOTE — Telephone Encounter (Signed)
Pt updated with lab results along with MD's recommendations. Pt verbalized understanding and state he will return this week for lab work. Orders placed.   Geralynn Rile, MD  12/13/2019 3:55 PM EDT     We need to get him to repeat his CBC to look at his platelets. Can we get this done this week? He has a TEE and left and right heart cath on 10/20. He doesn't need any delays.   Let him know his kidney/liver panel was normal.   BNP normal. No heart failure.

## 2019-12-14 NOTE — Telephone Encounter (Signed)
° °  Pt is returning Hayley's call for his lab result

## 2019-12-15 NOTE — Addendum Note (Signed)
Addended by: Wonda Horner on: 12/15/2019 10:46 AM   Modules accepted: Orders

## 2019-12-16 DIAGNOSIS — Z01812 Encounter for preprocedural laboratory examination: Secondary | ICD-10-CM | POA: Diagnosis not present

## 2019-12-17 ENCOUNTER — Other Ambulatory Visit (HOSPITAL_COMMUNITY)
Admission: RE | Admit: 2019-12-17 | Discharge: 2019-12-17 | Disposition: A | Discharge status: Home / Self Care | Payer: PPO | Source: Ambulatory Visit | Attending: Cardiovascular Disease | Admitting: Cardiovascular Disease

## 2019-12-17 ENCOUNTER — Telehealth: Payer: Self-pay

## 2019-12-17 DIAGNOSIS — I10 Essential (primary) hypertension: Secondary | ICD-10-CM | POA: Insufficient documentation

## 2019-12-17 DIAGNOSIS — I340 Nonrheumatic mitral (valve) insufficiency: Secondary | ICD-10-CM | POA: Insufficient documentation

## 2019-12-17 DIAGNOSIS — Z01812 Encounter for preprocedural laboratory examination: Secondary | ICD-10-CM

## 2019-12-17 LAB — CBC
HCT: 41.8 % (ref 39.0–52.0)
Hematocrit: 40.5 % (ref 37.5–51.0)
Hemoglobin: 13.9 g/dL (ref 13.0–17.7)
Hemoglobin: 13.9 g/dL (ref 13.0–17.0)
MCH: 30.8 pg (ref 26.6–33.0)
MCH: 30.8 pg (ref 26.0–34.0)
MCHC: 34.3 g/dL (ref 31.5–35.7)
MCHC: 33.3 g/dL (ref 30.0–36.0)
MCV: 90 fL (ref 79–97)
MCV: 92.7 fL (ref 80.0–100.0)
Platelets: ADEQUATE 10*3/uL (ref 150–400)
RBC: 4.52 x10E6/uL (ref 4.14–5.80)
RBC: 4.51 MIL/uL (ref 4.22–5.81)
RDW: 11.8 % (ref 11.6–15.4)
RDW: 12.3 % (ref 11.5–15.5)
WBC: 5.2 10*3/uL (ref 3.4–10.8)
WBC: 4.9 10*3/uL (ref 4.0–10.5)
nRBC: 0 % (ref 0.0–0.2)

## 2019-12-17 NOTE — Telephone Encounter (Signed)
Spoke with patient, advised patient that re draw was needed for CBC and that this would need to be done at the hospital lab. Order placed, advised patient to come to office to pick up lab slip and then report to [facility] hospital to have this done. Patient verbalized understanding.

## 2019-12-20 ENCOUNTER — Other Ambulatory Visit (HOSPITAL_COMMUNITY)
Admission: RE | Admit: 2019-12-20 | Discharge: 2019-12-20 | Disposition: A | Discharge status: Home / Self Care | Payer: PPO | Source: Ambulatory Visit | Attending: Cardiovascular Disease | Admitting: Cardiovascular Disease

## 2019-12-20 ENCOUNTER — Telehealth: Payer: Self-pay

## 2019-12-20 DIAGNOSIS — Z20822 Contact with and (suspected) exposure to covid-19: Secondary | ICD-10-CM | POA: Insufficient documentation

## 2019-12-20 DIAGNOSIS — I340 Nonrheumatic mitral (valve) insufficiency: Secondary | ICD-10-CM

## 2019-12-20 DIAGNOSIS — Z01812 Encounter for preprocedural laboratory examination: Secondary | ICD-10-CM

## 2019-12-20 LAB — PLATELET BY CITRATE: Platelet CT in Citrate: 168

## 2019-12-20 LAB — SARS CORONAVIRUS 2 (TAT 6-24 HRS): SARS Coronavirus 2: NEGATIVE

## 2019-12-20 NOTE — Telephone Encounter (Signed)
Called to inform pt of new lab work ordered by Dr. Audie Box in preparation for his upcoming procedure. Instructed pt to pick up hard copies of lab requisition forms from front desk and then have labs collected at [facility] lab.

## 2019-12-21 ENCOUNTER — Telehealth: Payer: Self-pay | Admitting: *Deleted

## 2019-12-21 NOTE — Telephone Encounter (Signed)
Pt contacted pre-catheterization scheduled at [facility] for: Wednesday December 22, 2019 2:30 PM Verified arrival time and place: [place] [facility]) at: 12 Noon/TEE 1 PM   Nothing to eat or drink after midnight prior to procedures, may have sips of water to take medications  Hold: Viagra-until post procedure  AM meds can be  taken pre-cath with sips of water including: ASA 81 mg   Confirmed patient has responsible adult to drive home post procedure and be with patient first 24 hours after arriving home: yes  You are allowed ONE visitor in the waiting room during the time you are at the hospital for your procedure. Both you and your visitor must wear a mask once you enter the hospital.       COVID-19 Pre-Screening Questions:  . In the past 14 days have you had a new cough, new headache, new nasal congestion, fever (100.4 or greater) unexplained body aches, new sore throat, or sudden loss of taste or sense of smell? no . In the past 14 days have you been around anyone with known Covid 19? no . Have you been vaccinated for COVID-19? Yes, see immunization history

## 2019-12-22 ENCOUNTER — Ambulatory Visit (HOSPITAL_COMMUNITY): Payer: PPO | Admitting: Certified Registered Nurse Anesthetist

## 2019-12-22 ENCOUNTER — Other Ambulatory Visit: Payer: Self-pay

## 2019-12-22 ENCOUNTER — Ambulatory Visit (HOSPITAL_BASED_OUTPATIENT_CLINIC_OR_DEPARTMENT_OTHER)
Admission: RE | Admit: 2019-12-22 | Discharge: 2019-12-22 | Disposition: A | Discharge status: Home / Self Care | Payer: PPO | Source: Ambulatory Visit | Attending: Cardiovascular Disease | Admitting: Cardiovascular Disease

## 2019-12-22 ENCOUNTER — Ambulatory Visit (HOSPITAL_COMMUNITY)
Admission: RE | Admit: 2019-12-22 | Discharge: 2019-12-22 | Disposition: A | Discharge status: Home / Self Care | Payer: PPO | Attending: Cardiovascular Disease | Admitting: Cardiovascular Disease

## 2019-12-22 ENCOUNTER — Encounter (HOSPITAL_COMMUNITY)
Admission: RE | Disposition: A | Discharge status: Home / Self Care | Payer: Self-pay | Source: Home / Self Care | Attending: Cardiovascular Disease

## 2019-12-22 ENCOUNTER — Encounter (HOSPITAL_COMMUNITY): Payer: Self-pay | Admitting: Cardiovascular Disease

## 2019-12-22 DIAGNOSIS — Z8546 Personal history of malignant neoplasm of prostate: Secondary | ICD-10-CM | POA: Diagnosis not present

## 2019-12-22 DIAGNOSIS — I2510 Atherosclerotic heart disease of native coronary artery without angina pectoris: Secondary | ICD-10-CM | POA: Insufficient documentation

## 2019-12-22 DIAGNOSIS — N400 Enlarged prostate without lower urinary tract symptoms: Secondary | ICD-10-CM | POA: Insufficient documentation

## 2019-12-22 DIAGNOSIS — Z79899 Other long term (current) drug therapy: Secondary | ICD-10-CM | POA: Diagnosis not present

## 2019-12-22 DIAGNOSIS — D696 Thrombocytopenia, unspecified: Secondary | ICD-10-CM | POA: Diagnosis not present

## 2019-12-22 DIAGNOSIS — K589 Irritable bowel syndrome without diarrhea: Secondary | ICD-10-CM | POA: Insufficient documentation

## 2019-12-22 DIAGNOSIS — F329 Major depressive disorder, single episode, unspecified: Secondary | ICD-10-CM | POA: Insufficient documentation

## 2019-12-22 DIAGNOSIS — I341 Nonrheumatic mitral (valve) prolapse: Secondary | ICD-10-CM | POA: Insufficient documentation

## 2019-12-22 DIAGNOSIS — Z7982 Long term (current) use of aspirin: Secondary | ICD-10-CM | POA: Diagnosis not present

## 2019-12-22 DIAGNOSIS — I10 Essential (primary) hypertension: Secondary | ICD-10-CM | POA: Diagnosis not present

## 2019-12-22 DIAGNOSIS — I081 Rheumatic disorders of both mitral and tricuspid valves: Secondary | ICD-10-CM | POA: Diagnosis not present

## 2019-12-22 DIAGNOSIS — F419 Anxiety disorder, unspecified: Secondary | ICD-10-CM | POA: Diagnosis not present

## 2019-12-22 DIAGNOSIS — I340 Nonrheumatic mitral (valve) insufficiency: Secondary | ICD-10-CM | POA: Diagnosis not present

## 2019-12-22 DIAGNOSIS — E785 Hyperlipidemia, unspecified: Secondary | ICD-10-CM | POA: Insufficient documentation

## 2019-12-22 HISTORY — PX: RIGHT/LEFT HEART CATH AND CORONARY ANGIOGRAPHY: CATH118266

## 2019-12-22 HISTORY — PX: TEE WITHOUT CARDIOVERSION: SHX5443

## 2019-12-22 HISTORY — PX: BUBBLE STUDY: SHX6837

## 2019-12-22 LAB — ECHO TEE
MV M vel: 4.85 m/s
MV Peak grad: 93.9 mmHg
Radius: 1.43 cm

## 2019-12-22 LAB — POCT I-STAT EG7
Acid-Base Excess: 0 mmol/L (ref 0.0–2.0)
Bicarbonate: 26.5 mmol/L (ref 20.0–28.0)
Calcium, Ion: 1.24 mmol/L (ref 1.15–1.40)
HCT: 40 % (ref 39.0–52.0)
Hemoglobin: 13.6 g/dL (ref 13.0–17.0)
O2 Saturation: 74 %
Potassium: 3.8 mmol/L (ref 3.5–5.1)
Sodium: 143 mmol/L (ref 135–145)
TCO2: 28 mmol/L (ref 22–32)
pCO2, Ven: 48.6 mmHg (ref 44.0–60.0)
pH, Ven: 7.345 (ref 7.250–7.430)
pO2, Ven: 42 mmHg (ref 32.0–45.0)

## 2019-12-22 LAB — PATHOLOGIST SMEAR REVIEW

## 2019-12-22 SURGERY — RIGHT/LEFT HEART CATH AND CORONARY ANGIOGRAPHY
Anesthesia: LOCAL

## 2019-12-22 SURGERY — ECHOCARDIOGRAM, TRANSESOPHAGEAL
Anesthesia: Monitor Anesthesia Care

## 2019-12-22 MED ORDER — HEPARIN SODIUM (PORCINE) 1000 UNIT/ML IJ SOLN
INTRAMUSCULAR | Status: DC | PRN
  Administered 2019-12-22: 4000 [IU] via INTRAVENOUS

## 2019-12-22 MED ORDER — SODIUM CHLORIDE 0.9 % IV SOLN: 250.0000 mL | INTRAVENOUS | Status: DC | PRN

## 2019-12-22 MED ORDER — HEPARIN SODIUM (PORCINE) 1000 UNIT/ML IJ SOLN
INTRAMUSCULAR | Status: AC
  Filled 2019-12-22: qty 1

## 2019-12-22 MED ORDER — SODIUM CHLORIDE 0.9% FLUSH: 3.0000 mL | INTRAVENOUS | Status: DC | PRN

## 2019-12-22 MED ORDER — FENTANYL CITRATE (PF) 100 MCG/2ML IJ SOLN
INTRAMUSCULAR | Status: DC | PRN
Start: 2019-12-22 — End: 2019-12-22
  Administered 2019-12-22: 25 ug via INTRAVENOUS

## 2019-12-22 MED ORDER — HEPARIN (PORCINE) IN NACL 1000-0.9 UT/500ML-% IV SOLN
INTRAVENOUS | Status: AC
  Filled 2019-12-22: qty 1000

## 2019-12-22 MED ORDER — SODIUM CHLORIDE 0.9 % WEIGHT BASED INFUSION: 1.0000 mL/kg/h | INTRAVENOUS | Status: DC

## 2019-12-22 MED ORDER — LIDOCAINE HCL (PF) 1 % IJ SOLN
INTRAMUSCULAR | Status: DC | PRN
  Administered 2019-12-22: 3 mL

## 2019-12-22 MED ORDER — SODIUM CHLORIDE 0.9 % WEIGHT BASED INFUSION
3.0000 mL/kg/h | INTRAVENOUS | Status: AC
  Administered 2019-12-22: 3 mL/kg/h via INTRAVENOUS

## 2019-12-22 MED ORDER — VERAPAMIL HCL 2.5 MG/ML IV SOLN
INTRAVENOUS | Status: DC | PRN
  Administered 2019-12-22: 10 mL via INTRA_ARTERIAL

## 2019-12-22 MED ORDER — FENTANYL CITRATE (PF) 100 MCG/2ML IJ SOLN
INTRAMUSCULAR | Status: AC
  Filled 2019-12-22: qty 2

## 2019-12-22 MED ORDER — ACETAMINOPHEN 325 MG PO TABS: 650.0000 mg | ORAL_TABLET | ORAL | Status: DC | PRN

## 2019-12-22 MED ORDER — HYDRALAZINE HCL 20 MG/ML IJ SOLN: 10.0000 mg | INTRAMUSCULAR | Status: DC | PRN

## 2019-12-22 MED ORDER — LIDOCAINE HCL (PF) 1 % IJ SOLN
INTRAMUSCULAR | Status: AC
  Filled 2019-12-22: qty 30

## 2019-12-22 MED ORDER — BUTAMBEN-TETRACAINE-BENZOCAINE 2-2-14 % EX AERO
INHALATION_SPRAY | CUTANEOUS | Status: DC | PRN
  Administered 2019-12-22: 2 via TOPICAL

## 2019-12-22 MED ORDER — HEPARIN (PORCINE) IN NACL 1000-0.9 UT/500ML-% IV SOLN
INTRAVENOUS | Status: DC | PRN
  Administered 2019-12-22 (×2): 500 mL

## 2019-12-22 MED ORDER — LIDOCAINE 2% (20 MG/ML) 5 ML SYRINGE
INTRAMUSCULAR | Status: DC | PRN
  Administered 2019-12-22: 80 mg via INTRAVENOUS

## 2019-12-22 MED ORDER — MIDAZOLAM HCL 2 MG/2ML IJ SOLN
INTRAMUSCULAR | Status: DC | PRN
  Administered 2019-12-22: 1 mg via INTRAVENOUS

## 2019-12-22 MED ORDER — ONDANSETRON HCL 4 MG/2ML IJ SOLN
INTRAMUSCULAR | Status: DC | PRN
  Administered 2019-12-22: 4 mg via INTRAVENOUS

## 2019-12-22 MED ORDER — SODIUM CHLORIDE 0.9% FLUSH: 3.0000 mL | Freq: Two times a day (BID) | INTRAVENOUS | Status: DC

## 2019-12-22 MED ORDER — LABETALOL HCL 5 MG/ML IV SOLN: 10.0000 mg | INTRAVENOUS | Status: DC | PRN

## 2019-12-22 MED ORDER — PROPOFOL 500 MG/50ML IV EMUL
INTRAVENOUS | Status: DC | PRN
  Administered 2019-12-22: 100 ug/kg/min via INTRAVENOUS

## 2019-12-22 MED ORDER — MIDAZOLAM HCL 2 MG/2ML IJ SOLN
INTRAMUSCULAR | Status: AC
  Filled 2019-12-22: qty 2

## 2019-12-22 MED ORDER — PROPOFOL 10 MG/ML IV BOLUS
INTRAVENOUS | Status: DC | PRN
  Administered 2019-12-22 (×4): 10 mg via INTRAVENOUS

## 2019-12-22 MED ORDER — ONDANSETRON HCL 4 MG/2ML IJ SOLN: 4.0000 mg | Freq: Four times a day (QID) | INTRAMUSCULAR | Status: DC | PRN

## 2019-12-22 MED ORDER — VERAPAMIL HCL 2.5 MG/ML IV SOLN
INTRAVENOUS | Status: AC
  Filled 2019-12-22: qty 2

## 2019-12-22 MED ORDER — IOHEXOL 350 MG/ML SOLN
INTRAVENOUS | Status: DC | PRN
  Administered 2019-12-22: 30 mL

## 2019-12-22 SURGICAL SUPPLY — 11 items
CATH 5FR JL3.5 JR4 ANG PIG MP (CATHETERS) ×2 IMPLANT
CATH SWAN GANZ 7F STRAIGHT (CATHETERS) ×2 IMPLANT
DEVICE RAD COMP TR BAND LRG (VASCULAR PRODUCTS) ×2 IMPLANT
GLIDESHEATH SLEND SS 6F .021 (SHEATH) ×2 IMPLANT
GLIDESHEATH SLENDER 7FR .021G (SHEATH) ×2 IMPLANT
GUIDEWIRE INQWIRE 1.5J.035X260 (WIRE) ×1 IMPLANT
INQWIRE 1.5J .035X260CM (WIRE) ×2
KIT HEART LEFT (KITS) ×2 IMPLANT
PACK CARDIAC CATHETERIZATION (CUSTOM PROCEDURE TRAY) ×2 IMPLANT
TRANSDUCER W/STOPCOCK (MISCELLANEOUS) ×2 IMPLANT
TUBING CIL FLEX 10 FLL-RA (TUBING) ×2 IMPLANT

## 2019-12-22 NOTE — Progress Notes (Signed)
  Echocardiogram Echocardiogram Transesophageal has been performed.  Tyler [name] 12/22/2019, 3:57 PM

## 2019-12-22 NOTE — Anesthesia Preprocedure Evaluation (Signed)
Anesthesia Evaluation  Patient identified by MRN, date of birth, ID band Patient awake    Reviewed: Allergy & Precautions, NPO status , Patient's Chart, lab work & pertinent test results  Airway Mallampati: II  TM Distance: >3 FB Neck ROM: Full    Dental  (+) Teeth Intact, Dental Advisory Given   Pulmonary    breath sounds clear to auscultation       Cardiovascular hypertension,  Rhythm:Regular Rate:Normal     Neuro/Psych    GI/Hepatic   Endo/Other    Renal/GU      Musculoskeletal   Abdominal   Peds  Hematology   Anesthesia Other Findings   Reproductive/Obstetrics                             Anesthesia Physical Anesthesia Plan  ASA: III  Anesthesia Plan: MAC   Post-op Pain Management:    Induction: Intravenous  PONV Risk Score and Plan: Ondansetron and Propofol infusion  Airway Management Planned: Nasal Cannula and Natural Airway  Additional Equipment:   Intra-op Plan:   Post-operative Plan:   Informed Consent: I have reviewed the patients History and Physical, chart, labs and discussed the procedure including the risks, benefits and alternatives for the proposed anesthesia with the patient or authorized representative who has indicated his/her understanding and acceptance.       Plan Discussed with: CRNA and Anesthesiologist  Anesthesia Plan Comments:         Anesthesia Quick Evaluation

## 2019-12-22 NOTE — Anesthesia Postprocedure Evaluation (Signed)
Anesthesia Post Note  Patient: NEIZAN DEBRUHL  Procedure(s) Performed: TRANSESOPHAGEAL ECHOCARDIOGRAM (TEE) (N/A ) BUBBLE STUDY     Patient location during evaluation: PACU Anesthesia Type: MAC Level of consciousness: awake and alert Pain management: pain level controlled Vital Signs Assessment: post-procedure vital signs reviewed and stable Respiratory status: spontaneous breathing, nonlabored ventilation, respiratory function stable and patient connected to nasal cannula oxygen Cardiovascular status: stable and blood pressure returned to baseline Postop Assessment: no apparent nausea or vomiting Anesthetic complications: no   No complications documented.  Last Vitals:  Vitals:   12/22/19 1554 12/22/19 1610  BP: 120/87 123/90  Pulse:  76  Resp:    Temp:    SpO2:  96%    Last Pain:  Vitals:   12/22/19 1557  TempSrc:   PainSc: 0-No pain                 Fareed Fung COKER

## 2019-12-22 NOTE — Interval H&P Note (Signed)
History and Physical Interval Note:  12/22/2019 3:01 PM  Tyler [name]  has presented today for surgery, with the diagnosis of Mitral regurgitation.  The various methods of treatment have been discussed with the patient and family. After consideration of risks, benefits and other options for treatment, the patient has consented to  Procedure(s): RIGHT/LEFT HEART CATH AND CORONARY ANGIOGRAPHY (N/A) as a surgical intervention.  The patient's history has been reviewed, patient examined, no change in status, stable for surgery.  I have reviewed the patient's chart and labs.  Questions were answered to the patient's satisfaction.     [name]

## 2019-12-22 NOTE — Interval H&P Note (Signed)
History and Physical Interval Note:  12/22/2019 12:29 PM  Tyler [name]  has presented today for surgery, with the diagnosis of [place].  The various methods of treatment have been discussed with the patient and family. After consideration of risks, benefits and other options for treatment, the patient has consented to  Procedure(s): TRANSESOPHAGEAL ECHOCARDIOGRAM (TEE) (N/A) as a surgical intervention.  The patient's history has been reviewed, patient examined, no change in status, stable for surgery.  I have reviewed the patient's chart and labs.  Questions were answered to the patient's satisfaction.     TEE for severe mitral regurgitation. NPO. Right and left heart cath.   [place] T. Audie Box, [place]  [address] [place], [place] [address] [phone]  12:29 PM

## 2019-12-22 NOTE — Anesthesia Procedure Notes (Signed)
Procedure Name: MAC Date/Time: 12/22/2019 1:41 PM Performed by: Dorthea Cove, CRNA Pre-anesthesia Checklist: Patient identified, Emergency Drugs available, Suction available, Patient being monitored and Timeout performed Patient Re-evaluated:Patient Re-evaluated prior to induction Oxygen Delivery Method: Nasal cannula Preoxygenation: Pre-oxygenation with 100% oxygen Induction Type: IV induction Placement Confirmation: positive ETCO2

## 2019-12-22 NOTE — Transfer of Care (Signed)
Immediate Anesthesia Transfer of Care Note  Patient: Tyler [name]  Procedure(s) Performed: TRANSESOPHAGEAL ECHOCARDIOGRAM (TEE) (N/A ) BUBBLE STUDY  Patient Location: Endoscopy Unit  Anesthesia Type:MAC  Level of Consciousness: awake, alert  and oriented  Airway & Oxygen Therapy: Patient Spontanous Breathing  Post-op Assessment: Report given to RN and Post -op Vital signs reviewed and stable  Post vital signs: Reviewed and stable  Last Vitals:  Vitals Value Taken Time  BP 112/73 12/22/19 1416  Temp    Pulse 77 12/22/19 1417  Resp 18 12/22/19 1417  SpO2 95 % 12/22/19 1417  Vitals shown include unvalidated device data.  Last Pain:  Vitals:   12/22/19 1220  TempSrc: Oral  PainSc: 2          Complications: No complications documented.

## 2019-12-22 NOTE — CV Procedure (Signed)
    TRANSESOPHAGEAL ECHOCARDIOGRAM   NAME:  Tyler [name]    MRN: [id number] DOB:  [birth year]    ADMIT DATE: 12/22/2019  INDICATIONS: Mitral Regurgitation   PROCEDURE:   Informed consent was obtained prior to the procedure. The risks, benefits and alternatives for the procedure were discussed and the patient comprehended these risks.  Risks include, but are not limited to, cough, sore throat, vomiting, nausea, somnolence, esophageal and stomach trauma or perforation, bleeding, low blood pressure, aspiration, pneumonia, infection, trauma to the teeth and death.    Procedural time out performed. The oropharynx was anesthetized with topical 1% benzocaine.    Anesthesia was administered by Dr. Linna Caprice.  The patient was administered a total of 200 mcg propofol and 80 mg lidocaine to achieve and maintain moderate conscious sedation.  The patient's heart rate, blood pressure, and oxygen saturation are monitored continuously during the procedure. The period of conscious sedation is 24 minutes, of which I was present face-to-face 100% of this time.   The transesophageal probe was inserted in the esophagus and stomach without difficulty and multiple views were obtained.   COMPLICATIONS:    There were no immediate complications.  KEY FINDINGS:  1. Severe MR. Flailed A3 segment. 2. Normal LVEF, 65%.  3. Full report to follow. 4. Further management per primary team.   Addison Naegeli. Audie Box, [place]  [address], [place], [place] [address] [phone]  2:16 PM

## 2019-12-22 NOTE — Discharge Instructions (Signed)
TEE  YOU HAD AN CARDIAC PROCEDURE TODAY: Refer to the procedure report and other information in the discharge instructions given to you for any specific questions about what was found during the examination. If this information does not answer your questions, please call [facility] office at [phone] to clarify.   DIET: Your first meal following the procedure should be a light meal and then it is ok to progress to your normal diet. A half-sandwich or bowl of soup is an example of a good first meal. Heavy or fried foods are harder to digest and may make you feel nauseous or bloated. Drink plenty of fluids but you should avoid alcoholic beverages for 24 hours. If you had a esophageal dilation, please see attached instructions for diet.   ACTIVITY: Your care partner should take you home directly after the procedure. You should plan to take it easy, moving slowly for the rest of the day. You can resume normal activity the day after the procedure however YOU SHOULD NOT DRIVE, use power tools, machinery or perform tasks that involve climbing or major physical exertion for 24 hours (because of the sedation medicines used during the test).   SYMPTOMS TO REPORT IMMEDIATELY: A cardiologist can be reached at any hour. Please call [phone] for any of the following symptoms:  Vomiting of blood or coffee ground material  New, significant abdominal pain  New, significant chest pain or pain under the shoulder blades  Painful or persistently difficult swallowing  New shortness of breath  Black, tarry-looking or red, bloody stools  FOLLOW UP:  Please also call with any specific questions about appointments or follow up tests.   Radial Site Care  This sheet gives you information about how to care for yourself after your procedure. Your health care provider may also give you more specific instructions. If you have problems or questions, contact your health care provider. What can I expect after the  procedure? After the procedure, it is common to have:  Bruising and tenderness at the catheter insertion area. Follow these instructions at home: Medicines  Take over-the-counter and prescription medicines only as told by your health care provider. Insertion site care  Follow instructions from your health care provider about how to take care of your insertion site. Make sure you: ? Wash your hands with soap and water before you change your bandage (dressing). If soap and water are not available, use hand sanitizer. ? Change your dressing as told by your health care provider. ? Leave stitches (sutures), skin glue, or adhesive strips in place. These skin closures may need to stay in place for 2 weeks or longer. If adhesive strip edges start to loosen and curl up, you may trim the loose edges. Do not remove adhesive strips completely unless your health care provider tells you to do that.  Check your insertion site every day for signs of infection. Check for: ? Redness, swelling, or pain. ? Fluid or blood. ? Pus or a bad smell. ? Warmth.  Do not take baths, swim, or use a hot tub until your health care provider approves.  You may shower 24-48 hours after the procedure, or as directed by your health care provider. ? Remove the dressing and gently wash the site with plain soap and water. ? Pat the area dry with a clean towel. ? Do not rub the site. That could cause bleeding.  Do not apply powder or lotion to the site. Activity   For 24 hours after the  procedure, or as directed by your health care provider: ? Do not flex or bend the affected arm. ? Do not push or pull heavy objects with the affected arm. ? Do not drive yourself home from the hospital or clinic. You may drive 24 hours after the procedure unless your health care provider tells you not to. ? Do not operate machinery or power tools.  Do not lift anything that is heavier than 10 lb (4.5 kg), or the limit that you are told,  until your health care provider says that it is safe.  Ask your health care provider when it is okay to: ? Return to work or school. ? Resume usual physical activities or sports. ? Resume sexual activity. General instructions  If the catheter site starts to bleed, raise your arm and put firm pressure on the site. If the bleeding does not stop, get help right away. This is a medical emergency.  If you went home on the same day as your procedure, a responsible adult should be with you for the first 24 hours after you arrive home.  Keep all follow-up visits as told by your health care provider. This is important. Contact a health care provider if:  You have a fever.  You have redness, swelling, or yellow drainage around your insertion site. Get help right away if:  You have unusual pain at the radial site.  The catheter insertion area swells very fast.  The insertion area is bleeding, and the bleeding does not stop when you hold steady pressure on the area.  Your arm or hand becomes pale, cool, tingly, or numb. These symptoms may represent a serious problem that is an emergency. Do not wait to see if the symptoms will go away. Get medical help right away. Call your local emergency services (911 in the [place] Do not drive yourself to the hospital. Summary  After the procedure, it is common to have bruising and tenderness at the site.  Follow instructions from your health care provider about how to take care of your radial site wound. Check the wound every day for signs of infection.  Do not lift anything that is heavier than 10 lb (4.5 kg), or the limit that you are told, until your health care provider says that it is safe. This information is not intended to replace advice given to you by your health care provider. Make sure you discuss any questions you have with your health care provider. Document Revised: 03/26/2017 Document Reviewed: 03/26/2017 Elsevier Patient Education  2020  [employer].

## 2019-12-23 ENCOUNTER — Encounter (HOSPITAL_COMMUNITY): Payer: Self-pay | Admitting: Cardiovascular Disease

## 2019-12-23 LAB — POCT I-STAT 7, (LYTES, BLD GAS, ICA,H+H)
Acid-Base Excess: 0 mmol/L (ref 0.0–2.0)
Bicarbonate: 25.8 mmol/L (ref 20.0–28.0)
Calcium, Ion: 1.24 mmol/L (ref 1.15–1.40)
HCT: 39 % (ref 39.0–52.0)
Hemoglobin: 13.3 g/dL (ref 13.0–17.0)
O2 Saturation: 98 %
Potassium: 3.9 mmol/L (ref 3.5–5.1)
Sodium: 144 mmol/L (ref 135–145)
TCO2: 27 mmol/L (ref 22–32)
pCO2 arterial: 46.6 mmHg (ref 32.0–48.0)
pH, Arterial: 7.351 (ref 7.350–7.450)
pO2, Arterial: 111 mmHg — ABNORMAL HIGH (ref 83.0–108.0)

## 2019-12-28 ENCOUNTER — Institutional Professional Consult (permissible substitution): Payer: PPO | Admitting: Thoracic Surgery (Cardiothoracic Vascular Surgery)

## 2019-12-28 ENCOUNTER — Other Ambulatory Visit: Payer: Self-pay | Admitting: Thoracic Surgery (Cardiothoracic Vascular Surgery)

## 2019-12-28 ENCOUNTER — Other Ambulatory Visit: Payer: Self-pay

## 2019-12-28 ENCOUNTER — Encounter: Payer: Self-pay | Admitting: Thoracic Surgery (Cardiothoracic Vascular Surgery)

## 2019-12-28 ENCOUNTER — Other Ambulatory Visit: Payer: Self-pay | Admitting: *Deleted

## 2019-12-28 VITALS — BP 120/77 | HR 68 | Resp 20 | Ht 69.0 in | Wt 169.0 lb

## 2019-12-28 DIAGNOSIS — I340 Nonrheumatic mitral (valve) insufficiency: Secondary | ICD-10-CM

## 2019-12-28 NOTE — Progress Notes (Signed)
[address]       ,[place] [address]             [phone]     CARDIOTHORACIC SURGERY CONSULTATION REPORT  Primary Cardiologist is O'Neal, Cassie Freer, MD PCP is Prince Solian, MD  Chief Complaint  Patient presents with  . Mitral Regurgitation    Surgical consult, ECHO 12/18/19, Cardiac Cath and TEE 12/22/19,     HPI:  Patient is a 69 year old gentleman with history of hypertension and depression who has been referred for surgical consultation for newly discovered mitral valve prolapse with severe mitral regurgitation.  Patient states that he has been relatively healthy all of his adult life.  He takes multiple medications for hypertension and major depression.  He has never had any significant cardiac history.  He was recently noted to have a prominent systolic murmur on routine physical exam by his primary care physician.  Subsequent transthoracic echocardiogram demonstrated mitral valve prolapse with severe mitral regurgitation.  He was referred for cardiology consultation and has been evaluated previously by Dr. Davina Poke.  Transesophageal echocardiogram and diagnostic cardiac catheterization were performed on December 22, 2019.  TEE confirmed the presence of myxomatous degenerative disease of the mitral valve with a large flail segment involving the A3 portion of the anterior leaflet with obvious ruptured primary chordae tendinae.  There was severe mitral regurgitation with flow reversal noted in all 4 pulmonary veins.  PISA radius measured 1.4 cm corresponding to ERO 1.02 cm and regurgitant volume of 154 mL.  Left ventricular size and systolic function was normal.  There was moderate left atrial enlargement.  No other significant abnormalities were noted.  Diagnostic cardiac catheterization revealed minimal nonobstructive coronary artery disease.  Right heart pressures were not elevated but there were large V waves on wedge tracing consistent with severe  mitral regurgitation.  Elective surgical consultation was requested.  Patient is married and lives locally with his wife.  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  He does not exercise on a regular basis but he reports being functionally independent.  He drives an automobile, does chores around the house, and reports no significant physical limitations.  He specifically denies any symptoms of exertional shortness of breath or chest discomfort.  He has never had any resting shortness of breath, PND, orthopnea, or lower extremity edema.  He denies any history of palpitations.  He has occasional mild dizzy spells without any history of syncope.   Past Medical History:  Diagnosis Date  . Adenomatous polyp of colon 02/2004  . Anemia   . Anxiety   . Arthritis   . BPH (benign prostatic hyperplasia)   . Colonic varices   . Degenerative joint disease   . Depression   . Depression   . Fatty liver   . Hemorrhoids   . Hyperlipidemia   . Hypertension   . IBS (irritable bowel syndrome)   . Prostate cancer ([place]) 2011  . Thrombocytopenia ([place])     Past Surgical History:  Procedure Laterality Date  . BUBBLE STUDY  12/22/2019   Procedure: BUBBLE STUDY;  Surgeon: Geralynn Rile, MD;  Location: [facility];  Service: Cardiovascular;;  . COLONOSCOPY    . PROSTATECTOMY    . RIGHT/LEFT HEART CATH AND CORONARY ANGIOGRAPHY N/A 12/22/2019   Procedure: RIGHT/LEFT HEART CATH AND CORONARY ANGIOGRAPHY;  Surgeon: [name], MD;  Location: [facility];  Service: Cardiovascular;  Laterality: N/A;  .  TEE WITHOUT CARDIOVERSION N/A 12/22/2019   Procedure: TRANSESOPHAGEAL ECHOCARDIOGRAM (TEE);  Surgeon: Geralynn Rile, MD;  Location: [facility];  Service: Cardiovascular;  Laterality: N/A;  . TONSILLECTOMY    . TYMPANOPLASTY  01/2016    Family History  Problem Relation Age of Onset  . Heart disease Mother   . Heart disease Father   .  Colon polyps Father   . Stroke Brother   . Depression Other        multiple family members  . Colon cancer Neg Hx   . Esophageal cancer Neg Hx   . Stomach cancer Neg Hx   . Celiac disease Neg Hx     Social History   Socioeconomic History  . Marital status: Married    Spouse name: Not on file  . Number of children: 3  . Years of education: Not on file  . Highest education level: Not on file  Occupational History  . Occupation: Chemical engineer: [employer]  Tobacco Use  . Smoking status: Never Smoker  . Smokeless tobacco: Never Used  Vaping Use  . Vaping Use: Never used  Substance and Sexual Activity  . Alcohol use: Yes    Comment: occ  . Drug use: No  . Sexual activity: Not on file  Other Topics Concern  . Not on file  Social History Narrative  . Not on file   Social Determinants of Health   Financial Resource Strain:   . Difficulty of Paying Living Expenses: Not on file  Food Insecurity:   . Worried About Charity fundraiser in the Last Year: Not on file  . Ran Out of Food in the Last Year: Not on file  Transportation Needs:   . Lack of Transportation (Medical): Not on file  . Lack of Transportation (Non-Medical): Not on file  Physical Activity:   . Days of Exercise per Week: Not on file  . Minutes of Exercise per Session: Not on file  Stress:   . Feeling of Stress : Not on file  Social Connections:   . Frequency of Communication with Friends and Family: Not on file  . Frequency of Social Gatherings with Friends and Family: Not on file  . Attends Religious Services: Not on file  . Active Member of Clubs or Organizations: Not on file  . Attends Archivist Meetings: Not on file  . Marital Status: Not on file  Intimate Partner Violence:   . Fear of Current or Ex-Partner: Not on file  . Emotionally Abused: Not on file  . Physically Abused: Not on file  . Sexually Abused: Not on file    Current Outpatient Medications    Medication Sig Dispense Refill  . ascorbic acid (VITAMIN C) 500 MG tablet Take 500 mg by mouth daily.    Marland Kitchen aspirin 81 MG tablet Take 81 mg by mouth daily.    Marland Kitchen b complex vitamins capsule Take 1 capsule by mouth daily.    Marland Kitchen buPROPion (WELLBUTRIN XL) 150 MG 24 hr tablet Take 150 mg by mouth daily.    Marland Kitchen dicyclomine (BENTYL) 10 MG capsule TAKE 1 CAPSULE BY MOUTH 3 TIMES DAILY AS NEEDED FOR SPASMS (ABD PAIN AND CRAMPING). (Patient taking differently: Take 10 mg by mouth 3 (three) times daily as needed for spasms. ) 90 capsule 3  . escitalopram (LEXAPRO) 5 MG tablet Take 5 mg by mouth daily.    . hydroxypropyl methylcellulose / hypromellose (ISOPTO TEARS / GONIOVISC) 2.5 %  ophthalmic solution Place 1 drop into both eyes 3 (three) times daily as needed for dry eyes.    Marland Kitchen irbesartan (AVAPRO) 150 MG tablet Take 150 mg by mouth daily. Takes one capsule by mouth once daily     . pantoprazole (PROTONIX) 40 MG tablet TAKE 1 TABLET BY MOUTH EVERY DAY (Patient taking differently: Take 40 mg by mouth daily. ) 90 tablet 0  . sildenafil (VIAGRA) 100 MG tablet Take 100 mg by mouth daily as needed for erectile dysfunction.    . traZODone (DESYREL) 50 MG tablet Take 50 mg by mouth at bedtime.     No current facility-administered medications for this visit.    No Known Allergies    Review of Systems:   General:  normal appetite, normal energy, no weight gain, no weight loss, no fever  Cardiac:  no chest pain with exertion, no chest pain at rest, no SOB with exertion, no resting SOB, no PND, no orthopnea, no palpitations, no arrhythmia, no atrial fibrillation, no LE edema, occasional dizzy spells, no syncope  Respiratory:  no shortness of breath, no home oxygen, no productive cough, no dry cough, no bronchitis, no wheezing, no hemoptysis, no asthma, no pain with inspiration or cough, no sleep apnea, no CPAP at night  GI:   no difficulty swallowing, no reflux, no frequent heartburn, no hiatal hernia, no abdominal  pain, no constipation, no diarrhea, no hematochezia, no hematemesis, no melena  GU:   no dysuria,  no frequency, no urinary tract infection, no hematuria, no enlarged prostate, no kidney stones, no kidney disease  Vascular:  no pain suggestive of claudication, no pain in feet, no leg cramps, no varicose veins, no DVT, no non-healing foot ulcer  Neuro:   no stroke, no TIA's, no seizures, no headaches, no temporary blindness one eye,  no slurred speech, no peripheral neuropathy, no chronic pain, no instability of gait, no memory/cognitive dysfunction  Musculoskeletal: no arthritis, no joint swelling, no myalgias, no difficulty walking, normal mobility   Skin:   no rash, no itching, no skin infections, no pressure sores or ulcerations  Psych:   no anxiety, + depression, no nervousness, no unusual recent stress  Eyes:   no blurry vision, + floaters, no recent vision changes, + wears glasses or contacts  ENT:   + hearing loss, no loose or painful teeth, no dentures, last saw dentist within the past 6 months  Hematologic:  no easy bruising, no abnormal bleeding, no clotting disorder, no frequent epistaxis  Endocrine:  no diabetes, does not check CBG's at home     Physical Exam:   BP 120/77   Pulse 68   Resp 20   Ht 5\' 9"  (1.753 m)   Wt 169 lb (76.7 kg)   SpO2 97% Comment: RA  BMI 24.96 kg/m   General:    well-appearing  HEENT:  Unremarkable   Neck:   no JVD, no bruits, no adenopathy   Chest:   clear to auscultation, symmetrical breath sounds, no wheezes, no rhonchi   CV:   RRR, grade IV/VI holosystolic murmur best at apex  Abdomen:  soft, non-tender, no masses   Extremities:  warm, well-perfused, pulses palpable, no LE edema  Rectal/GU  Deferred  Neuro:   Grossly non-focal and symmetrical throughout  Skin:   Clean and dry, no rashes, no breakdown   Diagnostic Tests:   ECHOCARDIOGRAM REPORT       Patient Name:  Tyler [name] Date of Exam: 12/08/2019  Medical  Rec #: [id number]    Height:    69.0 in  Accession #:  [id number]  Weight:    175.0 lb  Date of Birth: 08-27-1950   BSA:     1.952 m  Patient Age:  55 years   BP:      100/66 mmHg  Patient Gender: M       HR:      77 bpm.  Exam Location: [facility]   Procedure: 2D Echo, Cardiac Doppler and Color Doppler   Indications:  R01.1    History:    Patient has no prior history of Echocardiogram  examinations.         Signs/Symptoms:Murmur.    Sonographer:  Coralyn Helling RDCS  Referring Phys: [place]    1. There is severe MR that is directed posteriorly and laterally. There  is splay artifact. Suspect either flail segment that may be commissural  given rather eccentric jet. TEE is recommended for better  characterization. The mitral valve is myxomatous.  Severe mitral valve regurgitation. No evidence of mitral stenosis.  2. Left ventricular ejection fraction, by estimation, is 60 to 65%. The  left ventricle has normal function. The left ventricle has no regional  wall motion abnormalities. The left ventricular internal cavity size was  mildly dilated. Left ventricular  diastolic function could not be evaluated.  3. Right ventricular systolic function is normal. The right ventricular  size is normal. Tricuspid regurgitation signal is inadequate for assessing  PA pressure.  4. Left atrial size was mildly dilated.  5. The aortic valve is tricuspid. Aortic valve regurgitation is not  visualized. No aortic stenosis is present.  6. The inferior vena cava is normal in size with greater than 50%  respiratory variability, suggesting right atrial pressure of 3 mmHg.   FINDINGS  Left Ventricle: Left ventricular ejection fraction, by estimation, is 60  to 65%. The left ventricle has normal function. The left ventricle has no  regional wall motion abnormalities. The left ventricular internal cavity  size was mildly  dilated. There is  no left ventricular hypertrophy. Left ventricular diastolic function  could not be evaluated due to mitral regurgitation (moderate or greater).  Left ventricular diastolic function could not be evaluated.   Right Ventricle: The right ventricular size is normal. No increase in  right ventricular wall thickness. Right ventricular systolic function is  normal. Tricuspid regurgitation signal is inadequate for assessing PA  pressure.   Left Atrium: Left atrial size was mildly dilated.   Right Atrium: Right atrial size was normal in size.   Pericardium: There is no evidence of pericardial effusion.   Mitral Valve: There is severe MR that is directed posteriorly and  laterally. There is splay artifact. Suspect either flail segment that may  be commissural given rather eccentric jet. TEE is recommended for better  characterization. The mitral valve is  myxomatous. Severe mitral valve regurgitation, with eccentric posteriorly  directed jet. No evidence of mitral valve stenosis.   Tricuspid Valve: The tricuspid valve is grossly normal. Tricuspid valve  regurgitation is not demonstrated. No evidence of tricuspid stenosis.   Aortic Valve: The aortic valve is tricuspid. Aortic valve regurgitation is  not visualized. No aortic stenosis is present.   Pulmonic Valve: The pulmonic valve was grossly normal. Pulmonic valve  regurgitation is not visualized. No evidence of pulmonic stenosis.   Aorta: The aortic root and ascending aorta are structurally normal, with  no evidence of dilitation.  Venous: The inferior vena cava is normal in size with greater than 50%  respiratory variability, suggesting right atrial pressure of 3 mmHg.   IAS/Shunts: The atrial septum is grossly normal.     LEFT VENTRICLE  PLAX 2D  LVIDd:     5.90 cm Diastology  LVIDs:     3.90 cm LV e' medial:  7.72 cm/s  LV PW:     1.00 cm LV E/e' medial: 13.6  LV IVS:    1.00 cm LV  e' lateral:  9.57 cm/s  LVOT diam:   2.00 cm LV E/e' lateral: 11.0  LV SV:     56  LV SV Index:  29  LVOT Area:   3.14 cm     RIGHT VENTRICLE       IVC  RV S prime:   13.80 cm/s IVC diam: 1.40 cm  TAPSE (M-mode): 2.0 cm   LEFT ATRIUM       Index    RIGHT ATRIUM      Index  LA diam:    4.50 cm 2.31 cm/m RA Pressure: 3.00 mmHg  LA Vol (A2C):  73.1 ml 37.45 ml/m RA Area:   14.70 cm  LA Vol (A4C):  61.1 ml 31.30 ml/m RA Volume:  41.00 ml 21.00 ml/m  LA Biplane Vol: 70.2 ml 35.96 ml/m  AORTIC VALVE  LVOT Vmax:  106.00 cm/s  LVOT Vmean: 63.900 cm/s  LVOT VTI:  0.178 m    AORTA  Ao Root diam: 3.30 cm  Ao Asc diam: 3.20 cm   MV E velocity: 105.00 cm/s TRICUSPID VALVE  MV A velocity: 63.80 cm/s  Estimated RAP: 3.00 mmHg  MV E/A ratio: 1.65               SHUNTS               Systemic VTI: 0.18 m               Systemic Diam: 2.00 cm   Eleonore Chiquito MD  Electronically signed by Eleonore Chiquito MD  Signature Date/Time: 12/08/2019/5:02:47 PM      TRANSESOPHOGEAL ECHO REPORT       Patient Name:  Tyler [name] Date of Exam: 12/22/2019  Medical Rec #: [id number]   Height:    69.0 in  Accession #:  [id number]  Weight:    169.4 lb  Date of Birth: 01-30-1951   BSA:     1.925 m  Patient Age:  74 years   BP:      132/78 mmHg  Patient Gender: M       HR:      80 bpm.  Exam Location: Inpatient   Procedure: 3D Echo, Transesophageal Echo, Cardiac Doppler and Color  Doppler   Indications:   I34.0 Nonrheumatic mitral (valve) insufficiency    History:     Patient has prior history of Echocardiogram examinations,  most          recent 12/08/2019. Mitral Valve Disease;  Signs/Symptoms:Murmur.          Severe mitral regurgitation. Flail leaflet of mitral  valve.    Sonographer:   Roseanna Rainbow RDCS  Referring  Phys: [id number] [place]  Diagnosing Phys: Eleonore Chiquito MD   PROCEDURE: After discussion of the risks and benefits of a TEE, an  informed consent was obtained from the patient. The transesophogeal probe  was passed without difficulty through the esophogus of the patient. Imaged  were obtained with the patient in  a  left lateral decubitus position. Local oropharyngeal anesthetic was  provided with Cetacaine. Sedation performed by different physician. The  patient was monitored while under deep sedation. Anesthestetic sedation  was provided intravenously by  Anesthesiology: 260mg  of Propofol, 80mg  of Lidocaine. The patient's vital  signs; including heart rate, blood pressure, and oxygen saturation;  remained stable throughout the procedure. The patient developed no  complications during the procedure.   IMPRESSIONS    1. There is severe prolapse of the A3 scallop with flail segment. There  is severe eccentric MR. 2D PISA 1.4 cm, VC 0.8 cm, 2D ERO 1.02 cm2, Rvol  137 cc. 3D VCA 1.15 cm2, Rvol 154 cc. Flail gap ~5 mm and flail width 15  mm. MVA 5.71 cm2. There is systolic  flow reversal in all 4 pulmonary veins. The left ventricle is mildly  dilated. The mitral valve is abnormal. Severe mitral valve regurgitation.  No evidence of mitral stenosis.  2. Left ventricular ejection fraction, by estimation, is 60 to 65%. Left  ventricular ejection fraction by 3D volume is 65 %. The left ventricle has  normal function. The left ventricle has no regional wall motion  abnormalities. The left ventricular  internal cavity size was mildly dilated.  3. Right ventricular systolic function is normal. The right ventricular  size is normal.  4. Left atrial size was moderately dilated. No left atrial/left atrial  appendage thrombus was detected.  5. The aortic valve is tricuspid. Aortic valve regurgitation is not  visualized. No aortic stenosis is present.  6. There is mild (Grade  II) layered plaque involving the transverse  aorta.  7. Agitated saline contrast bubble study was negative, with no evidence  of any interatrial shunt.   FINDINGS  Left Ventricle: Left ventricular ejection fraction, by estimation, is 60  to 65%. Left ventricular ejection fraction by 3D volume is 65 %. The left  ventricle has normal function. The left ventricle has no regional wall  motion abnormalities. The left  ventricular internal cavity size was mildly dilated. There is no left  ventricular hypertrophy.   Right Ventricle: The right ventricular size is normal. No increase in  right ventricular wall thickness. Right ventricular systolic function is  normal.   Left Atrium: Left atrial size was moderately dilated. No left atrial/left  atrial appendage thrombus was detected.   Right Atrium: Right atrial size was normal in size.   Pericardium: There is no evidence of pericardial effusion.   Mitral Valve: There is severe prolapse of the A3 scallop with flail  segment. There is severe eccentric MR. 2D PISA 1.4 cm, VC 0.8 cm, 2D ERO  1.02 cm2, Rvol 137 cc. 3D VCA 1.15 cm2, Rvol 154 cc. Flail gap ~5 mm and  flail width 15 mm. MVA 5.71 cm2. There  is systolic flow reversal in all 4 pulmonary veins. The left ventricle is  mildly dilated. The mitral valve is abnormal. Severe mitral valve  regurgitation, with wall-impinging jet. No evidence of mitral valve  stenosis.   Tricuspid Valve: The tricuspid valve is grossly normal. Tricuspid valve  regurgitation is mild . No evidence of tricuspid stenosis.   Aortic Valve: The aortic valve is tricuspid. Aortic valve regurgitation is  not visualized. No aortic stenosis is present.   Pulmonic Valve: The pulmonic valve was grossly normal. Pulmonic valve  regurgitation is trivial. No evidence of pulmonic stenosis.   Aorta: The aortic root and ascending aorta are structurally normal, with  no evidence of dilitation. There is  mild (Grade II)  layered plaque  involving the transverse aorta.   Venous: A pattern of systolic flow reversal, suggestive of severe mitral  regurgitation is recorded from the right lower pulmonary vein, the right  upper pulmonary vein, the left lower pulmonary vein and the left upper  pulmonary vein.   IAS/Shunts: No atrial level shunt detected by color flow Doppler. Agitated  saline contrast was given intravenously to evaluate for intracardiac  shunting. Agitated saline contrast bubble study was negative, with no  evidence of any interatrial shunt.     LEFT VENTRICLE  PLAX 2D  LVOT diam:   2.30 cm  LV SV:     85  LV SV Index:  44       3D Volume EF  LVOT Area:   4.15 cm    LV 3D EF:  Left                       ventricular                       ejection                       fraction by                       3D volume                       is 65 %.                   3D Volume EF                 LV 3D EF:  65.00 %                 LV 3D EDV:  156900.00                       mm                 LV 3D ESV:  54900.00                       mm                 LV 3D SV:  102100.00                       mm                   3D Volume EF:                 3D EF:    65 %   AORTIC VALVE  LVOT Vmax:  109.97 cm/s  LVOT Vmean: 69.528 cm/s  LVOT VTI:  0.204 m    AORTA  Ao Root diam: 3.20 cm  Ao Asc diam: 3.00 cm   MR Peak grad:  93.9 mmHg  TRICUSPID VALVE  MR Mean grad:  60.0 mmHg  TR Peak grad:  30.2 mmHg  MR Vmax:     484.50 cm/s TR Vmax:    275.00 cm/s  MR Vmean:    351.0 cm/s  MR PISA:     12.91 cm  SHUNTS  MR PISA Eff ROA: 103 mm    Systemic VTI: 0.20 m  MR PISA Radius: 1.43  cm   Systemic Diam: 2.30 cm   Eleonore Chiquito MD  Electronically signed by Eleonore Chiquito MD  Signature Date/Time: 12/22/2019/9:44:07 PM      RIGHT/LEFT HEART CATH AND CORONARY ANGIOGRAPHY  Conclusion    Hemodynamic findings consistent with mitral valve regurgitation.   1.  Patent coronary arteries with mild coronary ectasia and nonobstructive plaquing, no high-grade stenoses present 2.  Normal right heart hemodynamics and preserved cardiac output 3.  24 mm V waves consistent with hemodynamically significant mitral regurgitation  Surgeon Notes    12/22/2019 2:17 PM CV Procedure signed by Geralynn Rile, MD  Indications  Severe mitral regurgitation [I34.0 (ICD-10-CM)]  Procedural Details  Technical Details INDICATION: Severe Primary Mitral Regurgitation  PROCEDURAL DETAILS: There was an indwelling IV in a right antecubital vein. Using normal sterile technique, the IV was changed out for a 5 Fr brachial sheath over a 0.018 inch wire. The right wrist was then prepped, draped, and anesthetized with 1% lidocaine. Using the modified Seldinger technique a 5/6 French Slender sheath was placed in the right radial artery. Intra-arterial verapamil was administered through the radial artery sheath. IV heparin was administered after a JR4 catheter was advanced into the central aorta. A Swan-Ganz catheter was used for the right heart catheterization. Standard protocol was followed for recording of right heart pressures and sampling of oxygen saturations. Fick cardiac output was calculated. Standard Judkins catheters were used for selective coronary angiography. LV pressure is recorded and an aortic valve pullback is performed. There were no immediate procedural complications. The patient was transferred to the post catheterization recovery area for further monitoring.    Estimated blood loss <50 mL.   During this procedure medications were  administered to achieve and maintain moderate conscious sedation while the patient's heart rate, blood pressure, and oxygen saturation were continuously monitored and I was present face-to-face 100% of this time.  Medications (Filter: Administrations occurring from 1093 to 1547 on 12/22/19) (important) Continuous medications are totaled by the amount administered until 12/22/19 1547.  Heparin (Porcine) in NaCl 1000-0.9 UT/500ML-% SOLN (mL) Total volume:  1,000 mL Date/Time  Rate/Dose/Volume Action  12/22/19 1504  500 mL Given  1504  500 mL Given    fentaNYL (SUBLIMAZE) injection (mcg) Total dose:  25 mcg Date/Time  Rate/Dose/Volume Action  12/22/19 1510  25 mcg Given    midazolam (VERSED) injection (mg) Total dose:  1 mg Date/Time  Rate/Dose/Volume Action  12/22/19 1511  1 mg Given    lidocaine (PF) (XYLOCAINE) 1 % injection (mL) Total volume:  3 mL Date/Time  Rate/Dose/Volume Action  12/22/19 1525  3 mL Given    Radial Cocktail/Verapamil only (mL) Total volume:  10 mL Date/Time  Rate/Dose/Volume Action  12/22/19 1526  10 mL Given    heparin sodium (porcine) injection (Units) Total dose:  4,000 Units Date/Time  Rate/Dose/Volume Action  12/22/19 1533  4,000 Units Given    iohexol (OMNIPAQUE) 350 MG/ML injection (mL) Total volume:  30 mL Date/Time  Rate/Dose/Volume Action  12/22/19 1546  30 mL Given    Sedation Time  Sedation Time Physician-1: 28 minutes 17 seconds  Contrast  Medication Name Total Dose  iohexol (OMNIPAQUE) 350 MG/ML injection 30 mL    Radiation/Fluoro  Fluoro time: 2.7 (min) DAP: 23302 (mGycm2) Cumulative Air Kerma: 427 (mGy)  Coronary Findings  Diagnostic Dominance: Right Left Anterior Descending  Vessel was injected. Vessel is large. There is mild diffuse disease throughout the vessel. The LAD is patent to the apex. The diagonal  branches are patent without significant stenosis. There is some hypodensity in the mid LAD at the origin of the  second diagonal, likely secondary to nonobstructive plaquing. There are no significant stenoses throughout the LAD territory.  Left Circumflex  Vessel is large. The vessel exhibits minimal luminal irregularities. The vessel is ectatic. The circumflex is large in caliber, the mid vessel is ectatic. The intermediate branch is patent with no stenosis. There were no significant stenoses throughout the circumflex distribution.  Right Coronary Artery  Vessel was injected. Vessel is very large. There is mild diffuse disease throughout the vessel. The vessel is ectatic. Large, dominant vessel. The vessel supplies an acute marginal branch, PDA branch, and 2 posterolateral branches. Mid RCA exhibits ectasia. There are diffuse irregularities with no significant stenoses present.  Intervention  No interventions have been documented. Right Heart  Right Heart Pressures Hemodynamic findings consistent with mitral valve regurgitation.  Coronary Diagrams  Diagnostic Dominance: Right  Intervention  Implants   No implant documentation for this case.  Syngo Images  Show images for CARDIAC CATHETERIZATION Images on Long Term Storage  Show images for Kegan, Mckeithan to Procedure Log  Procedure Log    Hemo Data   Most Recent Value  Fick Cardiac Output 5.65 L/min  Fick Cardiac Output Index 2.93 (L/min)/BSA  RA A Wave 7 mmHg  RA V Wave 5 mmHg  RA Mean 3 mmHg  RV Systolic Pressure 27 mmHg  RV Diastolic Pressure 2 mmHg  RV EDP 5 mmHg  PA Systolic Pressure 29 mmHg  PA Diastolic Pressure 6 mmHg  PA Mean 16 mmHg  PW A Wave 15 mmHg  PW V Wave 24 mmHg  PW Mean 12 mmHg  AO Systolic Pressure 96 mmHg  AO Diastolic Pressure 57 mmHg  AO Mean 76 mmHg  LV Systolic Pressure 967 mmHg  LV Diastolic Pressure 4 mmHg  LV EDP 9 mmHg  AOp Systolic Pressure 591 mmHg  AOp Diastolic Pressure 67 mmHg  AOp Mean Pressure 85 mmHg  LVp Systolic Pressure 638 mmHg  LVp Diastolic Pressure 4 mmHg  LVp EDP  Pressure 7 mmHg  QP/QS 1  TPVR Index 5.45 HRUI  TSVR Index 25.91 HRUI  PVR SVR Ratio 0.05  TPVR/TSVR Ratio 0.21       Impression:  Patient has mitral valve prolapse with stage C severe asymptomatic primary mitral regurgitation.  I have personally reviewed the patient's recent transthoracic and transesophageal echocardiograms.  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  Left ventricular function appears normal.  There is moderate left atrial enlargement.  The patient remains remarkably asymptomatic despite the magnitude of regurgitation.  Options include continued observation with close follow-up versus elective mitral valve repair.  Based upon my experience and review of the patient's clinical history and diagnostic tests I feel that risks associated with elective mitral valve repair should be associated with with less than 1% risk of operative mortality and greater than 98% likelihood of successful and durable mitral valve repair.  Patient will likely be a good candidate for minimally invasive approach for surgery.   Plan:  The patient and his wife were counseled at length regarding the indications, risks and potential benefits of mitral valve repair.  The rationale for elective surgery has been explained, including a comparison between surgery and continued medical therapy with close follow-up.  The  likelihood of successful and durable mitral valve repair has been discussed with particular reference to the findings of their recent echocardiogram.  Based upon these findings and previous experience, I have quoted them a greater than 98 percent likelihood of successful valve repair with less than 1 percent risk of mortality or major morbidity.  Alternative surgical approaches have been  discussed including a comparison between conventional sternotomy and minimally-invasive techniques.  The relative risks and benefits of each have been reviewed as they pertain to the patient's specific circumstances, and expectations for the patient's postoperative convalescence has been discussed.  The patient desires to proceed with surgery in the near future.    We tentatively plan to proceed with surgery on January 18, 2020.  Prior to surgery the patient will undergo CT angiography of the aorta and iliac vessels to evaluate the feasibility of peripheral cannulation for surgery.  The patient will return to our office for follow-up prior to surgery on January 17, 2020.  All questions answered.  I spent in excess of 90 minutes during the conduct of this office consultation and >50% of this time involved direct face-to-face encounter with the patient for counseling and/or coordination of their care.    Valentina Gu. Roxy Manns, MD 12/28/2019 3:41 PM

## 2019-12-28 NOTE — Patient Instructions (Signed)
Stop taking aspirin 7 days prior to surgery  Continue taking all other medications without change through the day before surgery.  Make sure to bring all of your medications with you when you come for your Pre-Admission Testing appointment at [facility] Short-Stay Department.  Have nothing to eat or drink after midnight the night before surgery.  On the morning of surgery take only Protonix with a sip of water.  At your appointment for Pre-Admission Testing at the [facility] Short-Stay Department you will be asked to sign permission forms for your upcoming surgery.  By definition your signature on these forms implies that you and/or your designee provide full informed consent for your planned surgical procedure(s), that alternative treatment options have been discussed, that you understand and accept any and all potential risks, and that you have some understanding of what to expect for your post-operative convalescence.  For any major cardiac surgical procedure potential operative risks include but are not limited to at least some risk of death, stroke or other neurologic complication, myocardial infarction, congestive heart failure, respiratory failure, renal failure, bleeding requiring blood transfusion and/or reexploration, irregular heart rhythm, heart block or bradycardia requiring permanent pacemaker, pneumonia, pericardial effusion, pleural effusion, wound infection, pulmonary embolus or other thromboembolic complication, chronic pain, or other complications related to the specific procedure(s) performed.  Please call to schedule a follow-up appointment in our office prior to surgery if you have any unresolved questions about your planned surgical procedure, the associated risks, alternative treatment options, and/or expectations for your post-operative recovery.

## 2020-01-12 ENCOUNTER — Ambulatory Visit
Admission: RE | Admit: 2020-01-12 | Discharge: 2020-01-12 | Disposition: A | Discharge status: Home / Self Care | Payer: PPO | Source: Ambulatory Visit | Attending: Thoracic Surgery (Cardiothoracic Vascular Surgery) | Admitting: Thoracic Surgery (Cardiothoracic Vascular Surgery)

## 2020-01-12 DIAGNOSIS — I340 Nonrheumatic mitral (valve) insufficiency: Secondary | ICD-10-CM

## 2020-01-12 DIAGNOSIS — I700 Atherosclerosis of aorta: Secondary | ICD-10-CM | POA: Diagnosis not present

## 2020-01-12 DIAGNOSIS — Z01818 Encounter for other preprocedural examination: Secondary | ICD-10-CM | POA: Diagnosis not present

## 2020-01-12 DIAGNOSIS — J8410 Pulmonary fibrosis, unspecified: Secondary | ICD-10-CM | POA: Diagnosis not present

## 2020-01-12 DIAGNOSIS — I517 Cardiomegaly: Secondary | ICD-10-CM | POA: Diagnosis not present

## 2020-01-12 MED ORDER — IOPAMIDOL (ISOVUE-370) INJECTION 76%
75.0000 mL | Freq: Once | INTRAVENOUS | Status: AC | PRN
  Administered 2020-01-12: 75 mL via INTRAVENOUS

## 2020-01-13 ENCOUNTER — Telehealth: Payer: Self-pay | Admitting: Cardiovascular Disease

## 2020-01-13 NOTE — Telephone Encounter (Signed)
Sending to MD

## 2020-01-13 NOTE — Telephone Encounter (Signed)
Sure that is fine.   [place] T. Audie Box, [place]  [address] [place], [place] [address] [phone]  2:20 PM

## 2020-01-13 NOTE — Telephone Encounter (Signed)
Magda Paganini is a Psychologist, educational who does massage therapy and she would like to know is a deep tissue massage okay for this patient- specifically the gluteal area.  Patient's appointment with her is at 2:00 pm. Please call/advise.   Thank you!

## 2020-01-13 NOTE — Telephone Encounter (Signed)
Per Dr. Audie Box, advised pt that he was cleared to get his massage as scheduled. Pt verbalizes understanding.

## 2020-01-14 ENCOUNTER — Inpatient Hospital Stay (HOSPITAL_COMMUNITY): Admission: RE | Admit: 2020-01-14 | Payer: PPO | Source: Ambulatory Visit

## 2020-01-14 ENCOUNTER — Other Ambulatory Visit: Payer: Self-pay

## 2020-01-14 ENCOUNTER — Emergency Department (HOSPITAL_COMMUNITY)
Admission: EM | Admit: 2020-01-14 | Discharge: 2020-01-14 | Disposition: A | Discharge status: Home / Self Care | Payer: PPO | Attending: Emergency Medicine | Admitting: Emergency Medicine

## 2020-01-14 ENCOUNTER — Other Ambulatory Visit (HOSPITAL_COMMUNITY): Payer: PPO

## 2020-01-14 ENCOUNTER — Ambulatory Visit (HOSPITAL_COMMUNITY): Payer: PPO

## 2020-01-14 ENCOUNTER — Emergency Department (HOSPITAL_COMMUNITY): Payer: PPO

## 2020-01-14 ENCOUNTER — Encounter (HOSPITAL_COMMUNITY): Payer: Self-pay | Admitting: *Deleted

## 2020-01-14 ENCOUNTER — Encounter (HOSPITAL_COMMUNITY): Payer: Self-pay

## 2020-01-14 DIAGNOSIS — S0990XA Unspecified injury of head, initial encounter: Secondary | ICD-10-CM | POA: Insufficient documentation

## 2020-01-14 DIAGNOSIS — Y92007 Garden or yard of unspecified non-institutional (private) residence as the place of occurrence of the external cause: Secondary | ICD-10-CM | POA: Insufficient documentation

## 2020-01-14 DIAGNOSIS — W01198A Fall on same level from slipping, tripping and stumbling with subsequent striking against other object, initial encounter: Secondary | ICD-10-CM | POA: Insufficient documentation

## 2020-01-14 DIAGNOSIS — M47812 Spondylosis without myelopathy or radiculopathy, cervical region: Secondary | ICD-10-CM | POA: Diagnosis not present

## 2020-01-14 DIAGNOSIS — J3489 Other specified disorders of nose and nasal sinuses: Secondary | ICD-10-CM | POA: Diagnosis not present

## 2020-01-14 DIAGNOSIS — S199XXA Unspecified injury of neck, initial encounter: Secondary | ICD-10-CM | POA: Diagnosis not present

## 2020-01-14 DIAGNOSIS — M25522 Pain in left elbow: Secondary | ICD-10-CM | POA: Diagnosis not present

## 2020-01-14 NOTE — Discharge Instructions (Addendum)
You were evaluated in the emergency department today after your fall at home. Your physical exam, vital signs, and CT scans were very reassuring. There is no evidence of bleeding in your brain, or broken bones in your head or your neck. X-ray of your left elbow also was negative for broken bones or dislocations.  You have an abrasion to the top of your head, as well as your left elbow. You may apply antibiotic ointment daily; please keep the wounds clean and dry. See your primary care doctor or return the emergency department if develop any redness, swelling, white or yellow drainage from your wounds, drainage has a foul odor, or if you develop fever chills, syncope since infection.  You may take Tylenol or ibuprofen as needed for pain. Follow-up with your primary care doctor next week.  Please return to the emergency department if you develop any new sudden severe headache, blurry vision, double vision, weakness in your arms or legs, or other new severe symptoms.

## 2020-01-14 NOTE — ED Notes (Signed)
DC instructions reviewed with pt. Pt verbalized understanding.  Pt DC 

## 2020-01-14 NOTE — ED Triage Notes (Signed)
The pt  Was cutting tree limbs  One of the limbs fell striking him and causing him to fall down on the concrete  No loc  Has no blood thinners.  Abrasions to his forehead nose and lt le elbow a and o  No active bleeding

## 2020-01-14 NOTE — ED Provider Notes (Signed)
[facility] EMERGENCY DEPARTMENT Provider Note   CSN: [id number] Arrival date & time: 01/14/20  1536     History Chief Complaint  Patient presents with  . Fall    Tyler [name] is a 69 y.o. male who presents after fall at home.  He states that he was cutting down a dead tree limb that was hanging from his property over the road, he tried to run out of the way once he had made the cut, however states he got disoriented, fell and hit his head on the concrete curb.  He denies LOC, dizziness, blurred vision, double vision, nausea, vomiting since that time. States he also has an abrasion on top of his head as well as on his left elbow, bruising in his left palm.  Patient is on daily baby aspirin, however states he has not taken this for 5 days as he was supposed to have a mitral valve repair next week.  States that this has recently been rescheduled.  He is not on any other anticoagulation.  Denies history of brain bleed, bleeding or clotting disorder.  I personally reviewed patient medical records. He has history of colonic polyps, arthritis, depression, fatty liver hemorrhoids, hyperlipidemia, degenerative disc disease, and hypertension, IBS, anxiety, prostate cancer.   HPI     Past Medical History:  Diagnosis Date  . Adenomatous polyp of colon 02/2004  . Anemia   . Anxiety   . Arthritis   . BPH (benign prostatic hyperplasia)   . Colonic varices   . Degenerative joint disease   . Depression   . Depression   . Fatty liver   . Hemorrhoids   . Hyperlipidemia   . Hypertension   . IBS (irritable bowel syndrome)   . Prostate cancer ([place]) 2011  . Thrombocytopenia [facility])     Patient Active Problem List   Diagnosis Date Noted  . Mitral regurgitation 12/20/2019  . VARICES OF OTHER SITES 12/30/2007  . Personal history of colonic polyps 12/25/2007    Past Surgical History:  Procedure Laterality Date  . BUBBLE STUDY  12/22/2019   Procedure: BUBBLE STUDY;   Surgeon: Geralynn Rile, MD;  Location: [facility];  Service: Cardiovascular;;  . COLONOSCOPY    . PROSTATECTOMY    . RIGHT/LEFT HEART CATH AND CORONARY ANGIOGRAPHY N/A 12/22/2019   Procedure: RIGHT/LEFT HEART CATH AND CORONARY ANGIOGRAPHY;  Surgeon: [name], MD;  Location: [facility] CV LAB;  Service: Cardiovascular;  Laterality: N/A;  . TEE WITHOUT CARDIOVERSION N/A 12/22/2019   Procedure: TRANSESOPHAGEAL ECHOCARDIOGRAM (TEE);  Surgeon: Geralynn Rile, MD;  Location: [facility];  Service: Cardiovascular;  Laterality: N/A;  . TONSILLECTOMY    . TYMPANOPLASTY  01/2016       Family History  Problem Relation Age of Onset  . Heart disease Mother   . Heart disease Father   . Colon polyps Father   . Stroke Brother   . Depression Other        multiple family members  . Colon cancer Neg Hx   . Esophageal cancer Neg Hx   . Stomach cancer Neg Hx   . Celiac disease Neg Hx     Social History   Tobacco Use  . Smoking status: Never Smoker  . Smokeless tobacco: Never Used  Vaping Use  . Vaping Use: Never used  Substance Use Topics  . Alcohol use: Yes    Comment: occ  . Drug use: No    Home Medications Prior to Admission  medications   Medication Sig Start Date End Date Taking? Authorizing Provider  ascorbic acid (VITAMIN C) 500 MG tablet Take 500 mg by mouth daily with breakfast.    Yes [provider]  B Complex Vitamins (VITAMIN-B COMPLEX) TABS Take 1 tablet by mouth daily with breakfast.   Yes [provider]  buPROPion (WELLBUTRIN XL) 150 MG 24 hr tablet Take 150 mg by mouth in the morning.    Yes [provider]  dicyclomine (BENTYL) 10 MG capsule TAKE 1 CAPSULE BY MOUTH 3 TIMES DAILY AS NEEDED FOR SPASMS (ABD PAIN AND CRAMPING). Patient taking differently: Take 10 mg by mouth daily as needed for spasms (or abdominal pain/cramping).  06/01/18  Yes Ladene Artist, MD  escitalopram (LEXAPRO) 5 MG tablet Take 5 mg by mouth  every evening.    Yes [provider]  hydroxypropyl methylcellulose / hypromellose (ISOPTO TEARS / GONIOVISC) 2.5 % ophthalmic solution Place 1 drop into both eyes 3 (three) times daily as needed (for dryness).    Yes [provider]  irbesartan (AVAPRO) 150 MG tablet Take 150 mg by mouth in the morning.  03/03/12  Yes [provider]  pantoprazole (PROTONIX) 40 MG tablet TAKE 1 TABLET BY MOUTH EVERY DAY Patient taking differently: Take 40 mg by mouth daily before breakfast.  08/03/19  Yes Ladene Artist, MD  sildenafil (VIAGRA) 100 MG tablet Take 100 mg by mouth daily as needed for erectile dysfunction.   Yes [provider]  traZODone (DESYREL) 50 MG tablet Take 50 mg by mouth at bedtime.   Yes [provider]  aspirin EC 81 MG tablet Take 81 mg by mouth daily. Swallow whole.    [provider]    Allergies    Patient has no known allergies.  Review of Systems   Review of Systems  Constitutional: Negative.   HENT: Negative.   Eyes: Negative for photophobia and visual disturbance.  Respiratory: Negative.   Cardiovascular: Negative.   Gastrointestinal: Negative.   Genitourinary: Negative.   Musculoskeletal: Negative.   Skin: Positive for wound.       Abrasion to the top of his head, abrasion to left elbow, bruising of left palm.  Allergic/Immunologic: Negative.   Neurological: Negative for dizziness, tremors, syncope, facial asymmetry, speech difficulty, weakness, light-headedness, numbness and headaches.  Hematological: Does not bruise/bleed easily.       Aspirin daily  Psychiatric/Behavioral: Negative for confusion and decreased concentration.    Physical Exam Updated Vital Signs BP (!) 141/97 (BP Location: Right Arm)   Pulse 69   Temp 98.7 F (37.1 C) (Oral)   Resp 18   Ht 5\' 8"  (1.727 m)   Wt 82.6 kg   SpO2 100%   BMI 27.67 kg/m   Physical Exam Vitals and nursing note reviewed.  Constitutional:      Appearance:  Normal appearance. He is normal weight.  HENT:     Head: Normocephalic.      Nose: Nose normal.     Mouth/Throat:     Mouth: Mucous membranes are moist.     Pharynx: Oropharynx is clear. No oropharyngeal exudate or posterior oropharyngeal erythema.     Tonsils: No tonsillar exudate.  Eyes:     General:        Right eye: No discharge.        Left eye: No discharge.     Extraocular Movements: Extraocular movements intact.     Conjunctiva/sclera: Conjunctivae normal.     Pupils:  Pupils are equal, round, and reactive to light.  Neck:     Trachea: Trachea and phonation normal.  Cardiovascular:     Rate and Rhythm: Normal rate and regular rhythm.     Pulses: Normal pulses.          Radial pulses are 2+ on the right side and 2+ on the left side.       Dorsalis pedis pulses are 2+ on the right side and 2+ on the left side.     Heart sounds: Normal heart sounds. No murmur heard.   Pulmonary:     Effort: Pulmonary effort is normal. No respiratory distress.     Breath sounds: Normal breath sounds. No wheezing or rales.  Chest:     Chest wall: No tenderness, crepitus or edema.  Abdominal:     General: There is no distension.     Palpations: Abdomen is soft.     Tenderness: There is no abdominal tenderness. There is no right CVA tenderness, left CVA tenderness, guarding or rebound.  Musculoskeletal:        General: No deformity.     Right shoulder: Normal.     Left shoulder: Normal.     Right upper arm: Normal.     Left upper arm: Normal.     Right elbow: Normal.     Right forearm: Normal.     Left forearm: Normal.     Right wrist: Normal.     Left wrist: Normal.     Right hand: Normal.     Left hand: No swelling, tenderness or bony tenderness. Normal strength. Normal sensation. Normal capillary refill. Normal pulse.       Arms:     Cervical back: Neck supple. No rigidity or tenderness. No pain with movement, spinous process tenderness or muscular tenderness.     Thoracic back:  No spasms, tenderness or bony tenderness.     Lumbar back: No spasms, tenderness or bony tenderness.     Right hip: Normal.     Left hip: Normal.     Right upper leg: Normal.     Left upper leg: Normal.     Right knee: Normal.     Left knee: Normal.     Right lower leg: Normal. No edema.     Left lower leg: Normal. No edema.     Right ankle: Normal.     Right Achilles Tendon: Normal.     Left ankle: Normal.     Left Achilles Tendon: Normal.     Right foot: Normal.     Left foot: Normal.     Comments: Full range of motion of the elbows bilaterally, 5/5 grip strength, full range of motion of the wrist, as well as digits bilaterally.  Lymphadenopathy:     Cervical: No cervical adenopathy.  Skin:    General: Skin is warm and dry.     Capillary Refill: Capillary refill takes less than 2 seconds.     Comments: Abrasion over left elbow  Neurological:     General: No focal deficit present.     Mental Status: He is alert and oriented to person, place, and time. Mental status is at baseline.     Sensory: Sensation is intact.     Motor: Motor function is intact.  Psychiatric:        Mood and Affect: Mood normal.     ED Results / Procedures / Treatments   Labs (all labs ordered are listed, but only abnormal  results are displayed) Labs Reviewed - No data to display  EKG None  Radiology DG Elbow Complete Left  Result Date: 01/14/2020 CLINICAL DATA:  Fall, left elbow pain EXAM: LEFT ELBOW - COMPLETE 3+ VIEW COMPARISON:  None. FINDINGS: There is no evidence of fracture, dislocation, or joint effusion. There is no evidence of arthropathy or other focal bone abnormality. Soft tissues are unremarkable. IMPRESSION: Negative. Electronically Signed   By: Fidela Salisbury MD   On: 01/14/2020 19:37   CT Head Wo Contrast  Result Date: 01/14/2020 CLINICAL DATA:  Head injury tree limb fell on patient EXAM: CT HEAD WITHOUT CONTRAST CT CERVICAL SPINE WITHOUT CONTRAST TECHNIQUE: Multidetector CT  imaging of the head and cervical spine was performed following the standard protocol without intravenous contrast. Multiplanar CT image reconstructions of the cervical spine were also generated. COMPARISON:  None. FINDINGS: CT HEAD FINDINGS Brain: No evidence of acute infarction, hemorrhage, hydrocephalus, extra-axial collection or mass lesion/mass effect. Vascular: No hyperdense vessel or unexpected calcification. Skull: Normal. Negative for fracture or focal lesion. Sinuses/Orbits: Mild mucosal thickening in the ethmoid sinuses Other: None CT CERVICAL SPINE FINDINGS Alignment: Normal. Skull base and vertebrae: No acute fracture. No primary bone lesion or focal pathologic process. Soft tissues and spinal canal: No prevertebral fluid or swelling. No visible canal hematoma. Disc levels: Moderate diffuse degenerative change C3 through C7 with diffuse disc space narrowing and small osteophytes. Bilateral foraminal narrowing at these levels. Upper chest: Negative. Other: None IMPRESSION: 1. Negative non contrasted CT appearance of the brain. 2. Moderate diffuse degenerative changes of the cervical spine. No acute osseous abnormality. Electronically Signed   By: Donavan Foil M.D.   On: 01/14/2020 20:13   CT Cervical Spine Wo Contrast  Result Date: 01/14/2020 CLINICAL DATA:  Head injury tree limb fell on patient EXAM: CT HEAD WITHOUT CONTRAST CT CERVICAL SPINE WITHOUT CONTRAST TECHNIQUE: Multidetector CT imaging of the head and cervical spine was performed following the standard protocol without intravenous contrast. Multiplanar CT image reconstructions of the cervical spine were also generated. COMPARISON:  None. FINDINGS: CT HEAD FINDINGS Brain: No evidence of acute infarction, hemorrhage, hydrocephalus, extra-axial collection or mass lesion/mass effect. Vascular: No hyperdense vessel or unexpected calcification. Skull: Normal. Negative for fracture or focal lesion. Sinuses/Orbits: Mild mucosal thickening in the  ethmoid sinuses Other: None CT CERVICAL SPINE FINDINGS Alignment: Normal. Skull base and vertebrae: No acute fracture. No primary bone lesion or focal pathologic process. Soft tissues and spinal canal: No prevertebral fluid or swelling. No visible canal hematoma. Disc levels: Moderate diffuse degenerative change C3 through C7 with diffuse disc space narrowing and small osteophytes. Bilateral foraminal narrowing at these levels. Upper chest: Negative. Other: None IMPRESSION: 1. Negative non contrasted CT appearance of the brain. 2. Moderate diffuse degenerative changes of the cervical spine. No acute osseous abnormality. Electronically Signed   By: Donavan Foil M.D.   On: 01/14/2020 20:13    Procedures Procedures (including critical care time)  Medications Ordered in ED Medications - No data to display  ED Course  I have reviewed the triage vital signs and the nursing notes.  Pertinent labs & imaging results that were available during my care of the patient were reviewed by me and considered in my medical decision making (see chart for details).    MDM Rules/Calculators/A&P                         69 year old-year-old male with fall at home, abrasion to the  top of the head and the left elbow.   Vital signs normal on intake.  Physical exam very reassuring.  Patient has abrasion to the top of the head as well as the left elbow, however his neurologic exam is normal.  Abrasion to the top of the head is approximately 2.5 centimeters in length and amenable to repair as it is not gaping, hemostatic.  We will proceed with CT head and cervical spine in this elderly patient with fall.  Plain film of the left elbow.  X-ray of the left elbow was negative for fracture or dislocation.  CT of the head negative for acute intracranial abnormality.  CT of the cervical spine with moderate diffuse degenerative changes, but without acute osseous abnormality.  Will clean and dress patient's  abrasions.  Given reassuring physical exam and imaging studies I do not feel any further work-up is necessary in the emergency department at this time.  Patient may take Tylenol or ibuprofen at home as needed for soreness.  Khi voiced understanding of his medical evaluation and treatment plan.  Each of his questions were answered to his expressed satisfaction.  Return precautions were given.  Patient is stable for discharge.   Final Clinical Impression(s) / ED Diagnoses Final diagnoses:  Injury of head, initial encounter    Rx / DC Orders ED Discharge Orders    None       Aura Dials 01/15/20 0108    Quintella Reichert, MD 01/15/20 1408

## 2020-01-17 ENCOUNTER — Ambulatory Visit: Payer: PPO | Admitting: Thoracic Surgery (Cardiothoracic Vascular Surgery)

## 2020-01-17 ENCOUNTER — Encounter: Payer: Self-pay | Admitting: *Deleted

## 2020-01-18 ENCOUNTER — Inpatient Hospital Stay: Admit: 2020-01-18 | Payer: PPO | Admitting: Thoracic Surgery (Cardiothoracic Vascular Surgery)

## 2020-01-18 SURGERY — REPAIR, MITRAL VALVE, MINIMALLY INVASIVE
Anesthesia: General | Site: Chest | Laterality: Right

## 2020-02-02 ENCOUNTER — Other Ambulatory Visit: Payer: Self-pay | Admitting: *Deleted

## 2020-02-02 DIAGNOSIS — I340 Nonrheumatic mitral (valve) insufficiency: Secondary | ICD-10-CM

## 2020-02-03 ENCOUNTER — Other Ambulatory Visit: Payer: Self-pay | Admitting: *Deleted

## 2020-02-03 DIAGNOSIS — R051 Acute cough: Secondary | ICD-10-CM | POA: Diagnosis not present

## 2020-02-03 DIAGNOSIS — Z20822 Contact with and (suspected) exposure to covid-19: Secondary | ICD-10-CM | POA: Diagnosis not present

## 2020-02-03 DIAGNOSIS — U071 COVID-19: Secondary | ICD-10-CM | POA: Diagnosis not present

## 2020-02-04 ENCOUNTER — Encounter: Payer: Self-pay | Admitting: *Deleted

## 2020-02-04 ENCOUNTER — Ambulatory Visit (HOSPITAL_COMMUNITY): Payer: PPO

## 2020-02-04 ENCOUNTER — Other Ambulatory Visit (HOSPITAL_COMMUNITY): Payer: PPO

## 2020-02-04 ENCOUNTER — Other Ambulatory Visit: Payer: Self-pay | Admitting: *Deleted

## 2020-02-04 ENCOUNTER — Inpatient Hospital Stay (HOSPITAL_COMMUNITY)
Admission: RE | Admit: 2020-02-04 | Discharge: 2020-02-04 | Disposition: A | Discharge status: Home / Self Care | Payer: PPO | Source: Ambulatory Visit

## 2020-02-04 ENCOUNTER — Encounter (HOSPITAL_COMMUNITY): Payer: Self-pay

## 2020-02-04 DIAGNOSIS — I340 Nonrheumatic mitral (valve) insufficiency: Secondary | ICD-10-CM

## 2020-02-07 ENCOUNTER — Ambulatory Visit: Payer: PPO | Admitting: Thoracic Surgery (Cardiothoracic Vascular Surgery)

## 2020-02-21 ENCOUNTER — Encounter: Payer: Self-pay | Admitting: Thoracic Surgery (Cardiothoracic Vascular Surgery)

## 2020-02-28 ENCOUNTER — Other Ambulatory Visit: Payer: Self-pay

## 2020-02-28 ENCOUNTER — Encounter: Payer: Self-pay | Admitting: Thoracic Surgery (Cardiothoracic Vascular Surgery)

## 2020-02-28 ENCOUNTER — Ambulatory Visit: Payer: PPO | Admitting: Thoracic Surgery (Cardiothoracic Vascular Surgery)

## 2020-02-28 VITALS — BP 132/73 | HR 61 | Temp 98.6°F | Resp 20 | Ht 68.0 in | Wt 166.0 lb

## 2020-02-28 DIAGNOSIS — I340 Nonrheumatic mitral (valve) insufficiency: Secondary | ICD-10-CM

## 2020-02-28 NOTE — Progress Notes (Signed)
[address]       [name] [address]             [phone]     CARDIOTHORACIC SURGERY OFFICE NOTE  Referring Provider is O'Neal, Ronnald Ramp, MD PCP is Chilton Greathouse, MD   HPI:  Patient is a 69 year old gentleman with history of hypertension and depression who returns to the office today for follow-up of recently discovered mitral valve prolapse with severe mitral regurgitation.  He was originally seen in consultation on December 28, 2019 and plans for elective mitral valve repair were initially made for November. However, the patient suffered a fall at home and then later developed minimally symptomatic COVID-19 infection in early December. He completed his quarantine and was finally seen by his dentist for clearance and found to have a problematic tooth with chronic infection that needs to be extracted. He has been scheduled for dental extraction early next week. He returns to our office today with hopes to proceed with elective mitral valve repair via right minithoracotomy approach on 03/15/2020. He states that he was exposed to COVID-19 by one of his grandchildren in early 36 and had minimal symptoms including low-grade fever and scratchy throat which subsided over a brief period of time. The patient has been fully vaccinated against COVID-19 last night. He never developed any significant cough nor shortness of breath. He remains reasonably active physically and reports no interval development of exertional shortness of breath or fatigue. He denies any history of orthopnea or lower extremity edema. He has never had any chest pain.   Current Outpatient Medications  Medication Sig Dispense Refill  . amoxicillin (AMOXIL) 500 MG capsule Take 500 mg by mouth 3 (three) times daily.    Marland Kitchen ascorbic acid (VITAMIN C) 500 MG tablet Take 500 mg by mouth daily with breakfast.     . aspirin EC 81 MG tablet Take 81 mg by mouth daily. Swallow whole.    . B Complex Vitamins  (VITAMIN-B COMPLEX) TABS Take 1 tablet by mouth daily with breakfast.    . buPROPion (WELLBUTRIN XL) 150 MG 24 hr tablet Take 150 mg by mouth in the morning.     . dicyclomine (BENTYL) 10 MG capsule TAKE 1 CAPSULE BY MOUTH 3 TIMES DAILY AS NEEDED FOR SPASMS (ABD PAIN AND CRAMPING). (Patient taking differently: Take 10 mg by mouth daily as needed for spasms (or abdominal pain/cramping).) 90 capsule 3  . escitalopram (LEXAPRO) 5 MG tablet Take 5 mg by mouth every evening.     . hydroxypropyl methylcellulose / hypromellose (ISOPTO TEARS / GONIOVISC) 2.5 % ophthalmic solution Place 1 drop into both eyes 3 (three) times daily as needed (for dryness).     . irbesartan (AVAPRO) 150 MG tablet Take 150 mg by mouth in the morning.     . pantoprazole (PROTONIX) 40 MG tablet TAKE 1 TABLET BY MOUTH EVERY DAY (Patient taking differently: Take 40 mg by mouth daily before breakfast.) 90 tablet 0  . sildenafil (VIAGRA) 100 MG tablet Take 100 mg by mouth daily as needed for erectile dysfunction.    . traZODone (DESYREL) 50 MG tablet Take 50 mg by mouth at bedtime.     No current facility-administered medications for this visit.      Physical Exam:   BP 132/73   Pulse 61   Temp 98.6 F (37 C) (Skin)   Resp 20   Ht 5\' 8"  (1.727 m)   Wt 166 lb (75.3 kg)  SpO2 97% Comment: RA  BMI 25.24 kg/m   General:  Well-appearing  Chest:   Clear to auscultation  CV:   Regular rate and rhythm with prominent systolic murmur  Incisions:  n/a  Abdomen:  Soft nontender  Extremities:  Warm and well-perfused  Diagnostic Tests:  CT ANGIOGRAPHY CHEST, ABDOMEN AND PELVIS  TECHNIQUE: Non-contrast CT of the chest was initially obtained.  Multidetector CT imaging through the chest, abdomen and pelvis was performed using the standard protocol during bolus administration of intravenous contrast. Multiplanar reconstructed images and MIPs were obtained and reviewed to evaluate the vascular anatomy.  CONTRAST:  42mL  ISOVUE-370 IOPAMIDOL (ISOVUE-370) INJECTION 76%  COMPARISON:  12/16/2014  FINDINGS: CTA CHEST FINDINGS  Cardiovascular:  Heart:  Heart size borderline enlarged. No pericardial fluid/thickening. No significant coronary calcifications.  Aorta:  Unremarkable course, caliber, contour of the thoracic aorta. No aneurysm or dissection flap. No periaortic fluid. Minimal atherosclerosis of the aortic arch. Four vessel arch with the left vertebral artery originating between left common carotid artery and the left subclavian artery. Cervical vessels patent at the base of the neck.  Diameter of the thoracic aorta 2.5 cm at the hiatus.  Pulmonary arteries:  Timing of the contrast bolus is not optimized for evaluation of pulmonary artery filling defects.  Mediastinum/Nodes: No mediastinal adenopathy. Unremarkable appearance of the thoracic esophagus.  Unremarkable appearance of the thoracic inlet.  Lungs/Pleura: Central airways are clear. No pleural effusion. No confluent airspace disease.  No pneumothorax. Calcified granuloma at the posterior right lower lobe, superior segment.  CTA ABDOMEN AND PELVIS FINDINGS  VASCULAR  Aorta: Unremarkable course, caliber, contour of the abdominal aorta. No dissection, aneurysm, or periaortic fluid.  Celiac: Patent, with no significant atherosclerotic changes.  SMA: Patent, with no significant atherosclerotic changes.  Renals:  - Right: Right renal artery patent.  - Left: Left renal artery patent.  IMA: Inferior mesenteric artery is patent.  Right lower extremity:  Unremarkable course, caliber, and contour of the right iliac system. No aneurysm, dissection, or occlusion. Hypogastric artery is patent. High bifurcation on the right. Common femoral artery patent. Proximal SFA and profunda femoris patent.  Left lower extremity:  Unremarkable course, caliber, and contour of the left iliac system. No  aneurysm, dissection, or occlusion. Hypogastric artery is patent. High bifurcation of the left. Common femoral artery patent. Proximal SFA and profunda femoris patent.  Veins: Unremarkable appearance of the venous system.  Review of the MIP images confirms the above findings.  NON-VASCULAR  Hepatobiliary: Unremarkable appearance of the liver. Unremarkable gall bladder.  Pancreas: Unremarkable.  Spleen: Unremarkable.  Adrenals/Urinary Tract:  - Right adrenal gland: Unremarkable  - Left adrenal gland: Unremarkable.  - Right kidney: No hydronephrosis, nephrolithiasis, inflammation, or ureteral dilation. Rounded lesion of the superomedial cortex 2.4 cm, with fluid density Hounsfield units, too small to characterize though most likely benign. Large cystic lesion on the inferior cortex, compatible with benign cyst.  - Left Kidney: No hydronephrosis, nephrolithiasis, inflammation, or ureteral dilation. Small fluid density cystic lesion on the posterior cortex of the left kidney, most likely a benign cyst.  - Urinary Bladder: Urinary bladder is decompressed.  Stomach/Bowel:  - Stomach: Unremarkable.  - Small bowel: Unremarkable  - Appendix: Normal.  - Colon: Colonic diverticular disease transverse colon and left colon. No acute inflammatory changes.  Lymphatic: No adenopathy.  Mesenteric: No free fluid or air. No mesenteric adenopathy.  Reproductive: Unremarkable appearance of the pelvic organs.  Other: No hernia.  Musculoskeletal: No acute displaced fracture.  Degenerative changes of the visualized spine. No significant bony canal narrowing.  IMPRESSION: Negative for acute abnormality.  High bifurcation of the bilateral common femoral artery.  Additional ancillary findings as above.  Signed,  Dulcy Fanny. Dellia Nims, RPVI  [facility]  [facility]   Electronically Signed   By:  Corrie Mckusick D.O.   On: 01/12/2020 11:37    Impression:  Patient has mitral valve prolapse with stage C severe asymptomatic primary mitral regurgitation.  I have personally reviewed the patient's recent transthoracic and transesophageal echocardiograms.  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  Left ventricular function appears normal.  There is moderate left atrial enlargement.  The patient remains remarkably asymptomatic despite the magnitude of regurgitation.  Options include continued observation with close follow-up versus elective mitral valve repair.  Based upon my experience and review of the patient's clinical history and diagnostic tests I feel that risks associated with elective mitral valve repair should be associated with with less than 1% risk of operative mortality and greater than 98% likelihood of successful and durable mitral valve repair.  Patient appears to be a good candidate for minimally invasive approach for surgery.   Plan:  The patient and his wife were again counseled at length regarding the indications, risks and potential benefits of mitral valve repair.  The rationale for elective surgery has been explained, including a comparison between surgery and continued medical therapy with close follow-up.  The likelihood of successful and durable mitral valve repair has been discussed with particular reference to the findings of their recent echocardiogram.  Based upon these findings and previous experience, I have quoted them a greater than 98 percent likelihood of successful valve repair with less than 1 percent risk of mortality or major morbidity.  Alternative surgical approaches have been discussed including a comparison between conventional sternotomy and  minimally-invasive techniques.  The relative risks and benefits of each have been reviewed as they pertain to the patient's specific circumstances, and expectations for the patient's postoperative convalescence has been discussed.  The patient desires to proceed with surgery on 03/15/2020 pending dental extraction and clearance by his dentist which has been scheduled for next week. He understands that if there is any sign of residual infection related to his tooth it would be wise to postpone surgery until the risk of infection is negligible.  The patient and his wife understand and accept all potential risks of surgery including but not limited to risk of death, stroke or other neurologic complication, myocardial infarction, congestive heart failure, respiratory failure, renal failure, bleeding requiring transfusion and/or reexploration, arrhythmia, heart block or bradycardia requiring permanent pacemaker insertion, infection or other wound complications, pneumonia, pleural and/or pericardial effusion, pulmonary embolus, aortic dissection or other major vascular complication, or other immediate or delayed complications related to valve repair or replacement including but not limited to recurrent or persistent mitral regurgitation and/or mitral stenosis, LV outflow tract obstruction, aortic insufficiency, paravalvular leak, posterior AV groove disruption, structural valve deterioration and failure, thrombosis, embolization, or endocarditis.  Specific risks potentially related to the minimally-invasive approach were discussed at length, including but not limited to risk of conversion to full or partial sternotomy, aortic dissection or other major vascular complication, unilateral acute lung injury or pulmonary edema, phrenic nerve dysfunction or paralysis, rib fracture, chronic pain, lung hernia, or lymphocele.  All of their questions have been answered.    I spent in excess of 15 minutes during the conduct of  this office consultation and >50% of this time involved direct face-to-face encounter with the patient for counseling and/or coordination of their care.   Salvatore Decent. Cornelius Moras, MD 02/28/2020 2:14 PM

## 2020-02-28 NOTE — Patient Instructions (Signed)
Stop taking aspirin  Continue taking all other medications without change through the day before surgery.  Make sure to bring all of your medications with you when you come for your Pre-Admission Testing appointment at [facility] Short-Stay Department.  Have nothing to eat or drink after midnight the night before surgery.  On the morning of surgery take only Protonix with a sip of water.  At your appointment for Pre-Admission Testing at the [facility] Short-Stay Department you will be asked to sign permission forms for your upcoming surgery.  By definition your signature on these forms implies that you and/or your designee provide full informed consent for your planned surgical procedure(s), that alternative treatment options have been discussed, that you understand and accept any and all potential risks, and that you have some understanding of what to expect for your post-operative convalescence.  For any major cardiac surgical procedure potential operative risks include but are not limited to at least some risk of death, stroke or other neurologic complication, myocardial infarction, congestive heart failure, respiratory failure, renal failure, bleeding requiring blood transfusion and/or reexploration, irregular heart rhythm, heart block or bradycardia requiring permanent pacemaker, aortic dissection or other major vascular complication, pneumonia, pericardial effusion, pleural effusion, wound infection, pulmonary embolus or other thromboembolic complication, chronic pain, or other complications related to the specific procedure(s) performed.  Please call to schedule a follow-up appointment in our office prior to surgery if you have any unresolved questions about your planned surgical procedure, the associated risks, alternative treatment options, and/or expectations for your post-operative recovery.    

## 2020-03-07 HISTORY — PX: TOOTH EXTRACTION: SUR596

## 2020-03-10 NOTE — Pre-Procedure Instructions (Signed)
[id number] - [place], [place] [address] AT [address] [place] [address] Phone: [phone] Fax: [phone]     Your procedure is scheduled on Wednesday 03/15/20 at 08:30 AM.  Report to [facility] Entr[facility] "A" at 06:30 A.M., and check in at the Admitting office.  Call this number if you have problems the morning of surgery:  [phone]  Call [phone] if you have any questions prior to your surgery date Monday-Friday 8am-4pm    Remember:  Do not eat or drink after midnight the night before your surgery.     Per Dr. Guy Sandifer instructions:   Stop taking aspirin  Continue taking all other medications without change through the day before surgery.  On the morning of surgery take only Protonix with a sip of water.                      Do not wear jewelry.            Do not wear lotions, powders, colognes, or deodorant.            Men may shave face and neck.            Do not bring valuables to the hospital.            [facility] is not responsible for any belongings or valuables.  Do NOT Smoke (Tobacco/Vaping) or drink Alcohol 24 hours prior to your procedure If you use a CPAP at night, you may bring all equipment for your overnight stay.   Contacts, glasses, dentures or bridgework may not be worn into surgery.      For patients admitted to the hospital, discharge time will be determined by your treatment team.   Patients discharged the day of surgery will not be allowed to drive home, and someone needs to stay with them for 24 hours.    Special instructions:   [place]- Preparing For Surgery  Before surgery, you can play an important role. Because skin is not sterile, your skin needs to be as free of germs as possible. You can reduce the number of germs on your skin by washing with CHG (chlorahexidine gluconate) Soap before surgery.  CHG is an antiseptic cleaner which kills germs and bonds  with the skin to continue killing germs even after washing.    Oral Hygiene is also important to reduce your risk of infection.  Remember - BRUSH YOUR TEETH THE MORNING OF SURGERY WITH YOUR REGULAR TOOTHPASTE  Please do not use if you have an allergy to CHG or antibacterial soaps. If your skin becomes reddened/irritated stop using the CHG.  Do not shave (including legs and underarms) for at least 48 hours prior to first CHG shower. It is OK to shave your face.  Please follow these instructions carefully.   1. Shower the NIGHT BEFORE SURGERY and the MORNING OF SURGERY with CHG Soap.   2. If you chose to wash your hair, wash your hair first as usual with your normal shampoo.  3. After you shampoo, rinse your hair and body thoroughly to remove the shampoo.  4. Use CHG as you would any other liquid soap. You can apply CHG directly to the skin and wash gently with a scrungie or a clean washcloth.   5. Apply the CHG Soap to your body ONLY FROM THE NECK DOWN.  Do not use on open wounds or open sores. Avoid contact with  your eyes, ears, mouth and genitals (private parts). Wash Face and genitals (private parts)  with your normal soap.   6. Wash thoroughly, paying special attention to the area where your surgery will be performed.  7. Thoroughly rinse your body with warm water from the neck down.  8. DO NOT shower/wash with your normal soap after using and rinsing off the CHG Soap.  9. Pat yourself dry with a CLEAN TOWEL.  10. Wear CLEAN PAJAMAS to bed the night before surgery  11. Place CLEAN SHEETS on your bed the night of your first shower and DO NOT SLEEP WITH PETS.   Day of Surgery: Wear Clean/Comfortable clothing the morning of surgery Do not apply any deodorants/lotions.   Remember to brush your teeth WITH YOUR REGULAR TOOTHPASTE.   Please read over the following fact sheets that you were given.

## 2020-03-13 ENCOUNTER — Ambulatory Visit (HOSPITAL_COMMUNITY)
Admission: RE | Admit: 2020-03-13 | Discharge: 2020-03-13 | Disposition: A | Discharge status: Home / Self Care | Payer: PPO | Source: Ambulatory Visit | Attending: Thoracic Surgery (Cardiothoracic Vascular Surgery) | Admitting: Thoracic Surgery (Cardiothoracic Vascular Surgery)

## 2020-03-13 ENCOUNTER — Encounter (HOSPITAL_COMMUNITY): Payer: Self-pay

## 2020-03-13 ENCOUNTER — Other Ambulatory Visit (HOSPITAL_COMMUNITY): Payer: PPO

## 2020-03-13 ENCOUNTER — Encounter (HOSPITAL_COMMUNITY)
Admission: RE | Admit: 2020-03-13 | Discharge: 2020-03-13 | Disposition: A | Discharge status: Home / Self Care | Payer: PPO | Source: Ambulatory Visit | Attending: Thoracic Surgery (Cardiothoracic Vascular Surgery) | Admitting: Thoracic Surgery (Cardiothoracic Vascular Surgery)

## 2020-03-13 ENCOUNTER — Other Ambulatory Visit: Payer: Self-pay

## 2020-03-13 DIAGNOSIS — Z01818 Encounter for other preprocedural examination: Secondary | ICD-10-CM | POA: Insufficient documentation

## 2020-03-13 DIAGNOSIS — I10 Essential (primary) hypertension: Secondary | ICD-10-CM | POA: Insufficient documentation

## 2020-03-13 DIAGNOSIS — E785 Hyperlipidemia, unspecified: Secondary | ICD-10-CM | POA: Insufficient documentation

## 2020-03-13 DIAGNOSIS — I340 Nonrheumatic mitral (valve) insufficiency: Secondary | ICD-10-CM

## 2020-03-13 HISTORY — DX: Gastro-esophageal reflux disease without esophagitis: K21.9

## 2020-03-13 HISTORY — DX: Cardiac murmur, unspecified: R01.1

## 2020-03-13 LAB — BLOOD GAS, ARTERIAL
Acid-Base Excess: 1.9 mmol/L (ref 0.0–2.0)
Bicarbonate: 25.5 mmol/L (ref 20.0–28.0)
FIO2: 21
O2 Saturation: 98.9 %
Patient temperature: 37
pCO2 arterial: 36.7 mmHg (ref 32.0–48.0)
pH, Arterial: 7.456 — ABNORMAL HIGH (ref 7.350–7.450)
pO2, Arterial: 129 mmHg — ABNORMAL HIGH (ref 83.0–108.0)

## 2020-03-13 LAB — COMPREHENSIVE METABOLIC PANEL
ALT: 17 U/L (ref 0–44)
AST: 25 U/L (ref 15–41)
Albumin: 3.8 g/dL (ref 3.5–5.0)
Alkaline Phosphatase: 62 U/L (ref 38–126)
Anion gap: 11 (ref 5–15)
BUN: 11 mg/dL (ref 8–23)
CO2: 21 mmol/L — ABNORMAL LOW (ref 22–32)
Calcium: 9.2 mg/dL (ref 8.9–10.3)
Chloride: 105 mmol/L (ref 98–111)
Creatinine, Ser: 0.84 mg/dL (ref 0.61–1.24)
GFR, Estimated: >60 mL/min (ref >60)
Glucose, Bld: 96 mg/dL (ref 70–99)
Potassium: 4.5 mmol/L (ref 3.5–5.1)
Sodium: 137 mmol/L (ref 135–145)
Total Bilirubin: 0.8 mg/dL (ref 0.3–1.2)
Total Protein: 6.7 g/dL (ref 6.5–8.1)

## 2020-03-13 LAB — SURGICAL PCR SCREEN
MRSA, PCR: NEGATIVE
Staphylococcus aureus: NEGATIVE

## 2020-03-13 LAB — CBC
HCT: 41.7 % (ref 39.0–52.0)
Hemoglobin: 13.4 g/dL (ref 13.0–17.0)
MCH: 30.4 pg (ref 26.0–34.0)
MCHC: 32.1 g/dL (ref 30.0–36.0)
MCV: 94.6 fL (ref 80.0–100.0)
Platelets: 154 10*3/uL (ref 150–400)
RBC: 4.41 MIL/uL (ref 4.22–5.81)
RDW: 13.3 % (ref 11.5–15.5)
WBC: 5.3 10*3/uL (ref 4.0–10.5)
nRBC: 0 % (ref 0.0–0.2)

## 2020-03-13 LAB — APTT: aPTT: 36 seconds (ref 24–36)

## 2020-03-13 LAB — URINALYSIS, ROUTINE W REFLEX MICROSCOPIC
Bilirubin Urine: NEGATIVE
Glucose, UA: NEGATIVE mg/dL
Hgb urine dipstick: NEGATIVE
Ketones, ur: NEGATIVE mg/dL
Leukocytes,Ua: NEGATIVE
Nitrite: NEGATIVE
Protein, ur: NEGATIVE mg/dL
Specific Gravity, Urine: 1.014 (ref 1.005–1.030)
pH: 7 (ref 5.0–8.0)

## 2020-03-13 LAB — HEMOGLOBIN A1C
Hgb A1c MFr Bld: 5.1 % (ref 4.8–5.6)
Mean Plasma Glucose: 99.67 mg/dL

## 2020-03-13 LAB — PROTIME-INR
INR: 1.1 (ref 0.8–1.2)
Prothrombin Time: 13.3 seconds (ref 11.4–15.2)

## 2020-03-13 LAB — TYPE AND SCREEN
ABO/RH(D): A NEG
Antibody Screen: NEGATIVE

## 2020-03-13 NOTE — Progress Notes (Signed)
Pre-MVR ultrasound study completed.   Please see CV Proc for preliminary results.   Darlin Coco, RDMS

## 2020-03-13 NOTE — Progress Notes (Addendum)
PCP - Ravisankar Avva Cardiologist - Darylene Price (Dr. Dolores Frame neal in the past)  PPM/ICD -denies   Chest x-ray - 03/13/20 EKG - 03/13/20 Stress Test - denies ECHO - 12/22/19 Cardiac Cath - 12/22/19  Sleep Study - denies    Patient instructed to hold all Aspirin, NSAID's, herbal medications, fish oil and vitamins 7 days prior to surgery.   ERAS Protocol -no   COVID TEST- tested positive first week of December 2021. No retest needed for surgery.   Anesthesia review: yes, cardiac history. Patient had his tooth pulled on January 4th and dentist told him that the infection was taken care of.   Patient denies shortness of breath, fever, cough and chest pain at PAT appointment   All instructions explained to the patient, with a verbal understanding of the material. Patient agrees to go over the instructions while at home for a better understanding. Patient also instructed to self quarantine after being tested for COVID-19. The opportunity to ask questions was provided.

## 2020-03-14 ENCOUNTER — Encounter: Payer: Self-pay | Admitting: *Deleted

## 2020-03-14 ENCOUNTER — Other Ambulatory Visit: Payer: Self-pay | Admitting: *Deleted

## 2020-03-15 ENCOUNTER — Ambulatory Visit: Payer: PPO | Admitting: Cardiovascular Disease

## 2020-03-22 ENCOUNTER — Encounter: Payer: Self-pay | Admitting: *Deleted

## 2020-03-27 ENCOUNTER — Other Ambulatory Visit (HOSPITAL_COMMUNITY): Payer: PPO

## 2020-03-27 ENCOUNTER — Other Ambulatory Visit: Payer: Self-pay

## 2020-03-27 ENCOUNTER — Ambulatory Visit: Payer: PPO | Admitting: Thoracic Surgery (Cardiothoracic Vascular Surgery)

## 2020-03-27 ENCOUNTER — Encounter: Payer: Self-pay | Admitting: Thoracic Surgery (Cardiothoracic Vascular Surgery)

## 2020-03-27 VITALS — BP 143/87 | HR 76 | Resp 20

## 2020-03-27 DIAGNOSIS — I340 Nonrheumatic mitral (valve) insufficiency: Secondary | ICD-10-CM

## 2020-03-27 NOTE — Patient Instructions (Signed)
Stop taking aspirin  Continue taking all other medications without change through the day before surgery.  Make sure to bring all of your medications with you when you come for your Pre-Admission Testing appointment at [facility] Short-Stay Department.  Have nothing to eat or drink after midnight the night before surgery.  On the morning of surgery take only Protonix with a sip of water.  At your appointment for Pre-Admission Testing at the [facility] Short-Stay Department you will be asked to sign permission forms for your upcoming surgery.  By definition your signature on these forms implies that you and/or your designee provide full informed consent for your planned surgical procedure(s), that alternative treatment options have been discussed, that you understand and accept any and all potential risks, and that you have some understanding of what to expect for your post-operative convalescence.  For any major cardiac surgical procedure potential operative risks include but are not limited to at least some risk of death, stroke or other neurologic complication, myocardial infarction, congestive heart failure, respiratory failure, renal failure, bleeding requiring blood transfusion and/or reexploration, irregular heart rhythm, heart block or bradycardia requiring permanent pacemaker, aortic dissection or other major vascular complication, pneumonia, pericardial effusion, pleural effusion, wound infection, pulmonary embolus or other thromboembolic complication, chronic pain, or other complications related to the specific procedure(s) performed.  Please call to schedule a follow-up appointment in our office prior to surgery if you have any unresolved questions about your planned surgical procedure, the associated risks, alternative treatment options, and/or expectations for your post-operative recovery.

## 2020-03-27 NOTE — Progress Notes (Signed)
[address]       [place] [address]             [phone]     CARDIOTHORACIC SURGERY OFFICE NOTE  Referring Provider is O'Neal, Cassie Freer, MD PCP is Prince Solian, MD   HPI:  Patient is a 70 year old gentleman with history of hypertension and depression who returns to the office today for follow-up of recently discovered mitral valve prolapse with severe mitral regurgitation.  He was originally seen in consultation on December 28, 2019 and plans for elective mitral valve repair were initially made for November. However, the patient suffered a fall at home and then later developed minimally symptomatic COVID-19 infection in early December. He completed his quarantine and was finally seen by his dentist for clearance and found to have a problematic tooth with chronic infection that needed extraction.  Once he finally got his tooth extracted surgery was postponed yet again because of restrictions placed on elective surgeries because of the recent surgeon Covid infections.  The patient returns her office today with hopes to proceed with elective mitral valve repair on April 11, 2020.  He reports no new problems or complaints.  He continues to deny any symptoms of exertional shortness of breath, fatigue or chest discomfort.   Current Outpatient Medications  Medication Sig Dispense Refill  . buPROPion (WELLBUTRIN XL) 150 MG 24 hr tablet Take 150 mg by mouth in the morning.     . dicyclomine (BENTYL) 10 MG capsule TAKE 1 CAPSULE BY MOUTH 3 TIMES DAILY AS NEEDED FOR SPASMS (ABD PAIN AND CRAMPING). (Patient taking differently: Take 10 mg by mouth daily as needed for spasms (or abdominal pain/cramping).) 90 capsule 3  . escitalopram (LEXAPRO) 5 MG tablet Take 5 mg by mouth every evening.     . hydroxypropyl methylcellulose / hypromellose (ISOPTO TEARS / GONIOVISC) 2.5 % ophthalmic solution Place 1 drop into both eyes 3 (three) times daily as needed (for dryness).      . irbesartan (AVAPRO) 150 MG tablet Take 150 mg by mouth in the morning.     . pantoprazole (PROTONIX) 40 MG tablet TAKE 1 TABLET BY MOUTH EVERY DAY (Patient taking differently: Take 40 mg by mouth daily before breakfast.) 90 tablet 0  . sildenafil (VIAGRA) 100 MG tablet Take 100 mg by mouth daily as needed for erectile dysfunction.    . traZODone (DESYREL) 50 MG tablet Take 50 mg by mouth at bedtime.    Marland Kitchen aspirin EC 81 MG tablet Take 81 mg by mouth daily. Swallow whole. (Patient not taking: Reported on 03/27/2020)     No current facility-administered medications for this visit.      Physical Exam:   BP (!) 143/87 (BP Location: Right Arm, Patient Position: Sitting)   Pulse 76   Resp 20   SpO2 99% Comment: RA  General:  Well-appearing  Chest:   Clear to auscultation  CV:   Regular rate and rhythm with prominent holosystolic murmur heard all across precordium  Incisions:  n/a  Abdomen:  Soft nontender  Extremities:  Warm and well-perfused  Diagnostic Tests:  n/a   Impression:  Patient has mitral valve prolapse with stage C severe asymptomatic primary mitral regurgitation. I have personally reviewed the patient's recent transthoracic and transesophageal echocardiograms. 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 Left  ventricular function appears normal. There is moderate left atrial enlargement. The patient remains remarkably asymptomatic despite the magnitude of regurgitation.  Options include continued observation with close follow-up versus elective mitral valve repair. Based upon my experience and review of the patient's clinical history and diagnostic tests I feel that risks associated with elective mitral valve repair should be associated with with less than 1%  risk of operative mortality and greater than 98% likelihood of successful and durable mitral valve repair. Patient appears tobe a good candidate for minimally invasive approach for surgery.   Plan:  The patientand his wife wereagain counseled at length regarding the indications, risks and potential benefits of mitral valve repair. The rationale for elective surgery has been explained, including a comparison between surgery and continued medical therapy with close follow-up. The likelihood of successful and durable mitral valve repair has been discussed with particular reference to the findings of their recent echocardiogram. Based upon these findings and previous experience, I have quoted them a greater than 98percent likelihood of successful valve repair with less than 1percent risk of mortality or major morbidity. Alternative surgical approaches have been discussed including a comparison between conventional sternotomy and minimally-invasive techniques. The relative risks and benefits of each have been reviewed as they pertain to the patient's specific circumstances, and expectations for the patient's postoperative convalescence has been discussed. The patient desires to proceed with surgery on 04/11/2020.  The patient and his wife understand and accept all potential risks of surgery including but not limited to risk of death, stroke or other neurologic complication, myocardial infarction, congestive heart failure, respiratory failure, renal failure, bleeding requiring transfusion and/or reexploration, arrhythmia, heart block or bradycardia requiring permanent pacemaker insertion, infection or other wound complications, pneumonia, pleural and/or pericardial effusion, pulmonary embolus, aortic dissection or other major vascular complication, or other immediate or delayed complications related to valve repair or replacement including but not limited to recurrent or persistent mitral  regurgitation and/or mitral stenosis, LV outflow tract obstruction, aortic insufficiency, paravalvular leak, posterior AV groove disruption, structural valve deterioration and failure, thrombosis, embolization, or endocarditis.  Specific risks potentially related to the minimally-invasive approach were discussed at length, including but not limited to risk of conversion to full or partial sternotomy, aortic dissection or other major vascular complication, unilateral acute lung injury or pulmonary edema, phrenic nerve dysfunction or paralysis, rib fracture, chronic pain, lung hernia, or lymphocele. All of their questions have been answered.    I spent in excess of 15 minutes during the conduct of this office consultation and >50% of this time involved direct face-to-face encounter with the patient for counseling and/or coordination of their care.   Valentina Gu. Roxy Manns, MD 03/27/2020 11:21 AM

## 2020-04-03 ENCOUNTER — Ambulatory Visit: Payer: PPO | Admitting: Thoracic Surgery (Cardiothoracic Vascular Surgery)

## 2020-04-06 ENCOUNTER — Other Ambulatory Visit (HOSPITAL_COMMUNITY): Payer: Self-pay | Admitting: *Deleted

## 2020-04-06 NOTE — Progress Notes (Signed)
Surgical Instructions    Your procedure is scheduled on February 8  Report to [facility] Main Entrance "A" at 0530 A.M., then check in with the Admitting office.  Call this number if you have problems the morning of surgery:  [phone]   If you have any questions prior to your surgery date call [phone]: Open Monday-Friday 8am-4pm    Remember:  Do not eat or drink after midnight the night before your surgery     Take these medicines the morning of surgery with A SIP OF WATER  buPROPion (WELLBUTRIN XL) escitalopram (LEXAPRO) Eye drops if needed pantoprazole (PROTONIX  Follow your surgeon's instructions on when to stop Aspirin.  If no instructions were given by your surgeon then you will need to call the office to get those instructions.    As of today, STOP taking any Aleve, Naproxen, Ibuprofen, Motrin, Advil, Goody's, BC's, all herbal medications, fish oil, and all vitamins.                     Do not wear jewelry            Do not wear lotions, powders, colognes, or deodorant.            Men may shave face and neck.            Do not bring valuables to the hospital.            [facility] is not responsible for any belongings or valuables.  Do NOT Smoke (Tobacco/Vaping) or drink Alcohol 24 hours prior to your procedure If you use a CPAP at night, you may bring all equipment for your overnight stay.   Contacts, glasses, dentures or bridgework may not be worn into surgery, please bring cases for these belongings   For patients admitted to the hospital, discharge time will be determined by your treatment team.   Patients discharged the day of surgery will not be allowed to drive home, and someone needs to stay with them for 24 hours.    Special instructions:   [facility]- Preparing For Surgery  Before surgery, you can play an important role. Because skin is not sterile, your skin needs to be as free of germs as possible. You can reduce the number of germs on your  skin by washing with CHG (chlorahexidine gluconate) Soap before surgery.  CHG is an antiseptic cleaner which kills germs and bonds with the skin to continue killing germs even after washing.    Oral Hygiene is also important to reduce your risk of infection.  Remember - BRUSH YOUR TEETH THE MORNING OF SURGERY WITH YOUR REGULAR TOOTHPASTE  Please do not use if you have an allergy to CHG or antibacterial soaps. If your skin becomes reddened/irritated stop using the CHG.  Do not shave (including legs and underarms) for at least 48 hours prior to first CHG shower. It is OK to shave your face.  Please follow these instructions carefully.   1. If you chose to wash your hair, wash your hair first as usual with your normal shampoo.  2. After you shampoo, rinse your hair and body thoroughly to remove the shampoo.  3. Wash Face and genitals (private parts) with your normal soap.   4. THEN Shower the NIGHT BEFORE SURGERY and the MORNING OF SURGERY with CHG Soap.   5. Use CHG as you would any other liquid soap. You can apply CHG directly to the skin and wash gently with  a scrungie or a clean washcloth.   6. Apply the CHG Soap to your body ONLY FROM THE NECK DOWN.  Do not use on open wounds or open sores. Avoid contact with your eyes, ears, mouth and genitals (private parts). Wash Face and genitals (private parts)  with your normal soap.   7. Wash thoroughly, paying special attention to the area where your surgery will be performed.  8. Thoroughly rinse your body with warm water from the neck down.  9. DO NOT shower/wash with your normal soap after using and rinsing off the CHG Soap.  10. Pat yourself dry with a CLEAN TOWEL.  11. Wear CLEAN PAJAMAS to bed the night before surgery  12. Place CLEAN SHEETS on your bed the night before your surgery  13. DO NOT SLEEP WITH PETS.   Day of Surgery: Wear Clean/Comfortable clothing the morning of surgery Do not apply any deodorants/lotions.    Remember to brush your teeth WITH YOUR REGULAR TOOTHPASTE.   Please read over the following fact sheets that you were given.

## 2020-04-07 ENCOUNTER — Encounter (HOSPITAL_COMMUNITY)
Admission: RE | Admit: 2020-04-07 | Discharge: 2020-04-07 | Disposition: A | Discharge status: Home / Self Care | Payer: PPO | Source: Ambulatory Visit | Attending: Thoracic Surgery (Cardiothoracic Vascular Surgery) | Admitting: Thoracic Surgery (Cardiothoracic Vascular Surgery)

## 2020-04-07 ENCOUNTER — Encounter (HOSPITAL_COMMUNITY): Payer: Self-pay

## 2020-04-07 ENCOUNTER — Other Ambulatory Visit: Payer: Self-pay

## 2020-04-07 DIAGNOSIS — Z01818 Encounter for other preprocedural examination: Secondary | ICD-10-CM | POA: Diagnosis not present

## 2020-04-07 DIAGNOSIS — Z01812 Encounter for preprocedural laboratory examination: Secondary | ICD-10-CM | POA: Insufficient documentation

## 2020-04-07 HISTORY — DX: Nontoxic single thyroid nodule: E04.1

## 2020-04-07 LAB — COMPREHENSIVE METABOLIC PANEL
ALT: 17 U/L (ref 0–44)
AST: 20 U/L (ref 15–41)
Albumin: 3.9 g/dL (ref 3.5–5.0)
Alkaline Phosphatase: 68 U/L (ref 38–126)
Anion gap: 10 (ref 5–15)
BUN: 10 mg/dL (ref 8–23)
CO2: 22 mmol/L (ref 22–32)
Calcium: 9.2 mg/dL (ref 8.9–10.3)
Chloride: 106 mmol/L (ref 98–111)
Creatinine, Ser: 0.68 mg/dL (ref 0.61–1.24)
GFR, Estimated: >60 mL/min (ref >60)
Glucose, Bld: 110 mg/dL — ABNORMAL HIGH (ref 70–99)
Potassium: 3.9 mmol/L (ref 3.5–5.1)
Sodium: 138 mmol/L (ref 135–145)
Total Bilirubin: 0.8 mg/dL (ref 0.3–1.2)
Total Protein: 6.8 g/dL (ref 6.5–8.1)

## 2020-04-07 LAB — BLOOD GAS, ARTERIAL
Acid-Base Excess: 1.7 mmol/L (ref 0.0–2.0)
Bicarbonate: 24.9 mmol/L (ref 20.0–28.0)
Drawn by: 602861
FIO2: 21
O2 Saturation: 98.7 %
Patient temperature: 37
pCO2 arterial: 33.4 mmHg (ref 32.0–48.0)
pH, Arterial: 7.485 — ABNORMAL HIGH (ref 7.350–7.450)
pO2, Arterial: 117 mmHg — ABNORMAL HIGH (ref 83.0–108.0)

## 2020-04-07 LAB — URINALYSIS, ROUTINE W REFLEX MICROSCOPIC
Bilirubin Urine: NEGATIVE
Glucose, UA: NEGATIVE mg/dL
Hgb urine dipstick: NEGATIVE
Ketones, ur: NEGATIVE mg/dL
Leukocytes,Ua: NEGATIVE
Nitrite: NEGATIVE
Protein, ur: NEGATIVE mg/dL
Specific Gravity, Urine: 1.012 (ref 1.005–1.030)
pH: 8 (ref 5.0–8.0)

## 2020-04-07 LAB — TYPE AND SCREEN
ABO/RH(D): A NEG
Antibody Screen: NEGATIVE

## 2020-04-07 LAB — CBC
HCT: 39.6 % (ref 39.0–52.0)
Hemoglobin: 13.6 g/dL (ref 13.0–17.0)
MCH: 31.7 pg (ref 26.0–34.0)
MCHC: 34.3 g/dL (ref 30.0–36.0)
MCV: 92.3 fL (ref 80.0–100.0)
Platelets: 134 10*3/uL — ABNORMAL LOW (ref 150–400)
RBC: 4.29 MIL/uL (ref 4.22–5.81)
RDW: 13.2 % (ref 11.5–15.5)
WBC: 5.4 10*3/uL (ref 4.0–10.5)
nRBC: 0 % (ref 0.0–0.2)

## 2020-04-07 LAB — SURGICAL PCR SCREEN
MRSA, PCR: NEGATIVE
Staphylococcus aureus: NEGATIVE

## 2020-04-07 LAB — HEMOGLOBIN A1C
Hgb A1c MFr Bld: 5.4 % (ref 4.8–5.6)
Mean Plasma Glucose: 108.28 mg/dL

## 2020-04-07 LAB — PROTIME-INR
INR: 1.1 (ref 0.8–1.2)
Prothrombin Time: 13.5 seconds (ref 11.4–15.2)

## 2020-04-07 LAB — APTT: aPTT: 33 seconds (ref 24–36)

## 2020-04-07 NOTE — Progress Notes (Signed)
PCP: Dr. Dagmar Hait Cardiologist: Dr. Audie Box  EKG: 03/13/20 CXR: Today ECHO: 12/22/19 Stress Test:  Cardiac Cath: 12/22/19  Pt tested POSITIVE for Covid on 02/03/2020 at [facility] in [place].  Results faxed and placed in chart--no need to retest prior to surgery.  Patient denies shortness of breath, fever, cough, and chest pain at PAT appointment.  Patient verbalized understanding of instructions provided today at the PAT appointment.  Patient asked to review instructions at home and day of surgery.

## 2020-04-10 MED ORDER — SODIUM CHLORIDE 0.9 % IV SOLN
1.5000 g | INTRAVENOUS | Status: AC
  Administered 2020-04-11: 1.5 g via INTRAVENOUS
  Filled 2020-04-10: qty 1.5

## 2020-04-10 MED ORDER — SODIUM CHLORIDE 0.9 % IV SOLN
INTRAVENOUS | Status: DC
  Filled 2020-04-10: qty 30

## 2020-04-10 MED ORDER — INSULIN REGULAR(HUMAN) IN NACL 100-0.9 UT/100ML-% IV SOLN
INTRAVENOUS | Status: AC
  Administered 2020-04-11: 1.5 [IU]/h via INTRAVENOUS
  Filled 2020-04-10: qty 100

## 2020-04-10 MED ORDER — SODIUM CHLORIDE 0.9 % IV SOLN
750.0000 mg | INTRAVENOUS | Status: AC
  Administered 2020-04-11: 750 mg via INTRAVENOUS
  Filled 2020-04-10: qty 750

## 2020-04-10 MED ORDER — VANCOMYCIN HCL 1500 MG/300ML IV SOLN
1500.0000 mg | INTRAVENOUS | Status: AC
  Administered 2020-04-11: 1500 mg via INTRAVENOUS
  Filled 2020-04-10: qty 300

## 2020-04-10 MED ORDER — TRANEXAMIC ACID (OHS) PUMP PRIME SOLUTION
2.0000 mg/kg | INTRAVENOUS | Status: DC
  Filled 2020-04-10: qty 1.53

## 2020-04-10 MED ORDER — POTASSIUM CHLORIDE 2 MEQ/ML IV SOLN
80.0000 meq | INTRAVENOUS | Status: DC
  Filled 2020-04-10: qty 40

## 2020-04-10 MED ORDER — TRANEXAMIC ACID 1000 MG/10ML IV SOLN
1.5000 mg/kg/h | INTRAVENOUS | Status: AC
  Administered 2020-04-11: 1.5 mg/kg/h via INTRAVENOUS
  Filled 2020-04-10: qty 25

## 2020-04-10 MED ORDER — PLASMA-LYTE 148 IV SOLN
INTRAVENOUS | Status: DC
  Filled 2020-04-10: qty 2.5

## 2020-04-10 MED ORDER — NITROGLYCERIN IN D5W 200-5 MCG/ML-% IV SOLN
2.0000 ug/min | INTRAVENOUS | Status: DC
  Filled 2020-04-10: qty 250

## 2020-04-10 MED ORDER — MILRINONE LACTATE IN DEXTROSE 20-5 MG/100ML-% IV SOLN
0.3000 ug/kg/min | INTRAVENOUS | Status: DC
  Filled 2020-04-10: qty 100

## 2020-04-10 MED ORDER — DEXMEDETOMIDINE HCL IN NACL 400 MCG/100ML IV SOLN
0.1000 ug/kg/h | INTRAVENOUS | Status: AC
  Administered 2020-04-11: .5 ug/kg/h via INTRAVENOUS
  Filled 2020-04-10: qty 100

## 2020-04-10 MED ORDER — VANCOMYCIN HCL 1000 MG IV SOLR
INTRAVENOUS | Status: DC
  Filled 2020-04-10: qty 1000

## 2020-04-10 MED ORDER — TRANEXAMIC ACID (OHS) BOLUS VIA INFUSION
15.0000 mg/kg | INTRAVENOUS | Status: AC
  Administered 2020-04-11: 1144.5 mg via INTRAVENOUS
  Filled 2020-04-10: qty 1145

## 2020-04-10 MED ORDER — MANNITOL 20 % IV SOLN
Freq: Once | INTRAVENOUS | Status: DC
  Filled 2020-04-10: qty 13

## 2020-04-10 MED ORDER — EPINEPHRINE HCL 5 MG/250ML IV SOLN IN NS
0.0000 ug/min | INTRAVENOUS | Status: DC
  Filled 2020-04-10: qty 250

## 2020-04-10 MED ORDER — PHENYLEPHRINE HCL-NACL 20-0.9 MG/250ML-% IV SOLN
30.0000 ug/min | INTRAVENOUS | Status: AC
  Administered 2020-04-11: 25 ug/min via INTRAVENOUS
  Filled 2020-04-10: qty 250

## 2020-04-10 MED ORDER — GLUTARALDEHYDE 0.625% SOAKING SOLUTION
TOPICAL | Status: DC
  Filled 2020-04-10: qty 50

## 2020-04-10 MED ORDER — NOREPINEPHRINE 4 MG/250ML-% IV SOLN
0.0000 ug/min | INTRAVENOUS | Status: DC
  Filled 2020-04-10: qty 250

## 2020-04-10 NOTE — H&P (Signed)
[address]       [place] [address]             [phone]          CARDIOTHORACIC SURGERY HISTORY AND PHYSICAL EXAM  Referring Provider is O'Neal, Cassie Freer, MD PCP is Prince Solian, MD  Chief Complaint  Patient presents with  . Mitral Regurgitation    Surgical consult, ECHO 12/18/19, Cardiac Cath and TEE 12/22/19    HPI:  Patient is a 70 year old gentleman with history of hypertension and depression who has been referred for surgical consultation for newly discovered mitral valve prolapse with severe mitral regurgitation.  Patient states that he has been relatively healthy all of his adult life.  He takes multiple medications for hypertension and major depression.  He has never had any significant cardiac history.  He was recently noted to have a prominent systolic murmur on routine physical exam by his primary care physician.  Subsequent transthoracic echocardiogram demonstrated mitral valve prolapse with severe mitral regurgitation.  He was referred for cardiology consultation and has been evaluated previously by Dr. Davina Poke.  Transesophageal echocardiogram and diagnostic cardiac catheterization were performed on December 22, 2019.  TEE confirmed the presence of myxomatous degenerative disease of the mitral valve with a large flail segment involving the A3 portion of the anterior leaflet with obvious ruptured primary chordae tendinae.  There was severe mitral regurgitation with flow reversal noted in all 4 pulmonary veins.  PISA radius measured 1.4 cm corresponding to ERO 1.02 cm and regurgitant volume of 154 mL.  Left ventricular size and systolic function was normal.  There was moderate left atrial enlargement.  No other significant abnormalities were noted.  Diagnostic cardiac catheterization revealed minimal nonobstructive coronary artery disease.  Right heart pressures were not elevated but there were large V waves on wedge tracing consistent with  severe mitral regurgitation.  Elective surgical consultation was requested.  Patient is married and lives locally with his wife.  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  He does not exercise on a regular basis but he reports being functionally independent.  He drives an automobile, does chores around the house, and reports no significant physical limitations.  He specifically denies any symptoms of exertional shortness of breath or chest discomfort.  He has never had any resting shortness of breath, PND, orthopnea, or lower extremity edema.  He denies any history of palpitations.  He has occasional mild dizzy spells without any history of syncope.  Patient returns to the office today for follow-up of recentlydiscovered mitral valve prolapse with severe mitral regurgitation.He was originally seen in consultation on October 26, 2021and plans for elective mitral valve repair were initially made for November. However, the patient suffered a fall at home and then later developed minimally symptomatic COVID-19 infection in early December. He completed his quarantine and was finally seen by his dentist for clearance and found to have a problematic tooth with chronic infection that needed extraction.  Once he finally got his tooth extracted surgery was postponed yet again because of restrictions placed on elective surgeries because of the recent surgeon Covid infections.  The patient returns her office today with hopes to proceed with elective mitral valve repair on April 11, 2020.  He reports no new problems or complaints.  He continues to deny any symptoms of exertional shortness of breath, fatigue or chest discomfort.   Past Medical History:  Diagnosis Date  .  Adenomatous polyp of colon 02/2004  . Anemia   . Anxiety   . Arthritis   . BPH (benign prostatic hyperplasia)   . Colonic varices   . Degenerative joint disease   . Depression   . Depression    . Fatty liver   . GERD (gastroesophageal reflux disease)   . Heart murmur   . Hemorrhoids   . Hyperlipidemia   . Hypertension   . IBS (irritable bowel syndrome)   . Prostate cancer ([facility]) 2011  . Thrombocytopenia ([facility])   . Thyroid nodule     Past Surgical History:  Procedure Laterality Date  . BUBBLE STUDY  12/22/2019   Procedure: BUBBLE STUDY;  Surgeon: Geralynn Rile, MD;  Location: [facility];  Service: Cardiovascular;;  . CARDIAC CATHETERIZATION    . COLONOSCOPY    . PROSTATECTOMY    . RIGHT/LEFT HEART CATH AND CORONARY ANGIOGRAPHY N/A 12/22/2019   Procedure: RIGHT/LEFT HEART CATH AND CORONARY ANGIOGRAPHY;  Surgeon: [name], MD;  Location: [facility];  Service: Cardiovascular;  Laterality: N/A;  . TEE WITHOUT CARDIOVERSION N/A 12/22/2019   Procedure: TRANSESOPHAGEAL ECHOCARDIOGRAM (TEE);  Surgeon: Geralynn Rile, MD;  Location: [facility];  Service: Cardiovascular;  Laterality: N/A;  . TONSILLECTOMY    . TOOTH EXTRACTION  03/07/2020  . TYMPANOPLASTY  01/2016    Family History  Problem Relation Age of Onset  . Heart disease Mother   . Heart disease Father   . Colon polyps Father   . Stroke Brother   . Depression Other        multiple family members  . Colon cancer Neg Hx   . Esophageal cancer Neg Hx   . Stomach cancer Neg Hx   . Celiac disease Neg Hx     Social History Social History   Tobacco Use  . Smoking status: Never Smoker  . Smokeless tobacco: Never Used  Vaping Use  . Vaping Use: Never used  Substance Use Topics  . Alcohol use: Yes    Comment: occ  . Drug use: No    Prior to Admission medications   Medication Sig Start Date End Date Taking? Authorizing Provider  buPROPion (WELLBUTRIN XL) 150 MG 24 hr tablet Take 150 mg by mouth in the morning.    Yes [provider]  dicyclomine (BENTYL) 10 MG capsule TAKE 1 CAPSULE BY MOUTH 3 TIMES DAILY AS NEEDED FOR SPASMS (ABD PAIN AND CRAMPING). Patient taking  differently: Take 10 mg by mouth daily as needed for spasms (or abdominal pain/cramping). 06/01/18  Yes Ladene Artist, MD  escitalopram (LEXAPRO) 5 MG tablet Take 5 mg by mouth every evening.    Yes [provider]  hydroxypropyl methylcellulose / hypromellose (ISOPTO TEARS / GONIOVISC) 2.5 % ophthalmic solution Place 1 drop into both eyes 3 (three) times daily as needed (for dryness).    Yes [provider]  irbesartan (AVAPRO) 150 MG tablet Take 150 mg by mouth in the morning.  03/03/12  Yes [provider]  pantoprazole (PROTONIX) 40 MG tablet TAKE 1 TABLET BY MOUTH EVERY DAY Patient taking differently: Take 40 mg by mouth daily before breakfast. 08/03/19  Yes Ladene Artist, MD  sildenafil (VIAGRA) 100 MG tablet Take 100 mg by mouth daily as needed for erectile dysfunction.   Yes [provider]  traZODone (DESYREL) 50 MG tablet Take 50 mg by mouth at bedtime.   Yes [provider]  aspirin EC 81 MG tablet Take 81 mg by  mouth daily. Swallow whole. Patient not taking: No sig reported    [provider]    No Known Allergies     Review of Systems:              General:                      normal appetite, normal energy, no weight gain, no weight loss, no fever             Cardiac:                       no chest pain with exertion, no chest pain at rest, no SOB with exertion, no resting SOB, no PND, no orthopnea, no palpitations, no arrhythmia, no atrial fibrillation, no LE edema, occasional dizzy spells, no syncope             Respiratory:                 no shortness of breath, no home oxygen, no productive cough, no dry cough, no bronchitis, no wheezing, no hemoptysis, no asthma, no pain with inspiration or cough, no sleep apnea, no CPAP at night             GI:                               no difficulty swallowing, no reflux, no frequent heartburn, no hiatal hernia, no abdominal pain, no constipation, no diarrhea, no  hematochezia, no hematemesis, no melena             GU:                              no dysuria,  no frequency, no urinary tract infection, no hematuria, no enlarged prostate, no kidney stones, no kidney disease             Vascular:                     no pain suggestive of claudication, no pain in feet, no leg cramps, no varicose veins, no DVT, no non-healing foot ulcer             Neuro:                         no stroke, no TIA's, no seizures, no headaches, no temporary blindness one eye,  no slurred speech, no peripheral neuropathy, no chronic pain, no instability of gait, no memory/cognitive dysfunction             Musculoskeletal:         no arthritis, no joint swelling, no myalgias, no difficulty walking, normal mobility              Skin:                            no rash, no itching, no skin infections, no pressure sores or ulcerations             Psych:                         no anxiety, + depression, no nervousness, no unusual recent stress  Eyes:                           no blurry vision, + floaters, no recent vision changes, + wears glasses or contacts             ENT:                            + hearing loss, no loose or painful teeth, no dentures, last saw dentist within the past 6 months             Hematologic:               no easy bruising, no abnormal bleeding, no clotting disorder, no frequent epistaxis             Endocrine:                   no diabetes, does not check CBG's at home                           Physical Exam:              BP 120/77   Pulse 68   Resp 20   Ht 5\' 9"  (1.753 m)   Wt 169 lb (76.7 kg)   SpO2 97% Comment: RA  BMI 24.96 kg/m              General:                        well-appearing             HEENT:                       Unremarkable              Neck:                           no JVD, no bruits, no adenopathy              Chest:                          clear to auscultation, symmetrical breath sounds, no wheezes, no rhonchi               CV:                              RRR, grade IV/VI holosystolic murmur best at apex             Abdomen:                    soft, non-tender, no masses              Extremities:                 warm, well-perfused, pulses palpable, no LE edema             Rectal/GU                   Deferred             Neuro:  Grossly non-focal and symmetrical throughout             Skin:                            Clean and dry, no rashes, no breakdown   Diagnostic Tests:   ECHOCARDIOGRAM REPORT       Patient Name:  Tyler [name] Date of Exam: 12/08/2019  Medical Rec #: [id number]   Height:    69.0 in  Accession #:  [id number]  Weight:    175.0 lb  Date of Birth: 1950-08-27   BSA:     1.952 m  Patient Age:  58 years   BP:      100/66 mmHg  Patient Gender: M       HR:      77 bpm.  Exam Location: [facility]   Procedure: 2D Echo, Cardiac Doppler and Color Doppler   Indications:  R01.1    History:    Patient has no prior history of Echocardiogram  examinations.         Signs/Symptoms:Murmur.    Sonographer:  Coralyn Helling RDCS  Referring Phys: [place]    1. There is severe MR that is directed posteriorly and laterally. There  is splay artifact. Suspect either flail segment that may be commissural  given rather eccentric jet. TEE is recommended for better  characterization. The mitral valve is myxomatous.  Severe mitral valve regurgitation. No evidence of mitral stenosis.  2. Left ventricular ejection fraction, by estimation, is 60 to 65%. The  left ventricle has normal function. The left ventricle has no regional  wall motion abnormalities. The left ventricular internal cavity size was  mildly dilated. Left ventricular  diastolic function could not be evaluated.  3. Right ventricular systolic function is normal. The right ventricular  size is normal.  Tricuspid regurgitation signal is inadequate for assessing  PA pressure.  4. Left atrial size was mildly dilated.  5. The aortic valve is tricuspid. Aortic valve regurgitation is not  visualized. No aortic stenosis is present.  6. The inferior vena cava is normal in size with greater than 50%  respiratory variability, suggesting right atrial pressure of 3 mmHg.   FINDINGS  Left Ventricle: Left ventricular ejection fraction, by estimation, is 60  to 65%. The left ventricle has normal function. The left ventricle has no  regional wall motion abnormalities. The left ventricular internal cavity  size was mildly dilated. There is  no left ventricular hypertrophy. Left ventricular diastolic function  could not be evaluated due to mitral regurgitation (moderate or greater).  Left ventricular diastolic function could not be evaluated.   Right Ventricle: The right ventricular size is normal. No increase in  right ventricular wall thickness. Right ventricular systolic function is  normal. Tricuspid regurgitation signal is inadequate for assessing PA  pressure.   Left Atrium: Left atrial size was mildly dilated.   Right Atrium: Right atrial size was normal in size.   Pericardium: There is no evidence of pericardial effusion.   Mitral Valve: There is severe MR that is directed posteriorly and  laterally. There is splay artifact. Suspect either flail segment that may  be commissural given rather eccentric jet. TEE is recommended for better  characterization. The mitral valve is  myxomatous. Severe mitral valve regurgitation, with eccentric posteriorly  directed jet. No evidence of mitral valve stenosis.   Tricuspid Valve: The tricuspid valve is  grossly normal. Tricuspid valve  regurgitation is not demonstrated. No evidence of tricuspid stenosis.   Aortic Valve: The aortic valve is tricuspid. Aortic valve regurgitation is  not visualized. No aortic stenosis is present.   Pulmonic  Valve: The pulmonic valve was grossly normal. Pulmonic valve  regurgitation is not visualized. No evidence of pulmonic stenosis.   Aorta: The aortic root and ascending aorta are structurally normal, with  no evidence of dilitation.   Venous: The inferior vena cava is normal in size with greater than 50%  respiratory variability, suggesting right atrial pressure of 3 mmHg.   IAS/Shunts: The atrial septum is grossly normal.     LEFT VENTRICLE  PLAX 2D  LVIDd:     5.90 cm Diastology  LVIDs:     3.90 cm LV e' medial:  7.72 cm/s  LV PW:     1.00 cm LV E/e' medial: 13.6  LV IVS:    1.00 cm LV e' lateral:  9.57 cm/s  LVOT diam:   2.00 cm LV E/e' lateral: 11.0  LV SV:     56  LV SV Index:  29  LVOT Area:   3.14 cm     RIGHT VENTRICLE       IVC  RV S prime:   13.80 cm/s IVC diam: 1.40 cm  TAPSE (M-mode): 2.0 cm   LEFT ATRIUM       Index    RIGHT ATRIUM      Index  LA diam:    4.50 cm 2.31 cm/m RA Pressure: 3.00 mmHg  LA Vol (A2C):  73.1 ml 37.45 ml/m RA Area:   14.70 cm  LA Vol (A4C):  61.1 ml 31.30 ml/m RA Volume:  41.00 ml 21.00 ml/m  LA Biplane Vol: 70.2 ml 35.96 ml/m  AORTIC VALVE  LVOT Vmax:  106.00 cm/s  LVOT Vmean: 63.900 cm/s  LVOT VTI:  0.178 m    AORTA  Ao Root diam: 3.30 cm  Ao Asc diam: 3.20 cm   MV E velocity: 105.00 cm/s TRICUSPID VALVE  MV A velocity: 63.80 cm/s  Estimated RAP: 3.00 mmHg  MV E/A ratio: 1.65               SHUNTS               Systemic VTI: 0.18 m               Systemic Diam: 2.00 cm   Eleonore Chiquito MD  Electronically signed by Eleonore Chiquito MD  Signature Date/Time: 12/08/2019/5:02:47 PM      TRANSESOPHOGEAL ECHO REPORT       Patient Name:  JAIRED KENNEMORE Date of Exam: 12/22/2019  Medical Rec #: [id number]   Height:    69.0 in  Accession #:  [id number]  Weight:    169.4 lb  Date of  Birth: 07/02/1950   BSA:     1.925 m  Patient Age:  59 years   BP:      132/78 mmHg  Patient Gender: M       HR:      80 bpm.  Exam Location: Inpatient   Procedure: 3D Echo, Transesophageal Echo, Cardiac Doppler and Color  Doppler   Indications:   I34.0 Nonrheumatic mitral (valve) insufficiency    History:     Patient has prior history of Echocardiogram examinations,  most          recent 12/08/2019. Mitral Valve Disease;  Signs/Symptoms:Murmur.  Severe mitral regurgitation. Flail leaflet of mitral  valve.    Sonographer:   Roseanna Rainbow RDCS  Referring Phys: [id number] [place]  Diagnosing Phys: Eleonore Chiquito MD   PROCEDURE: After discussion of the risks and benefits of a TEE, an  informed consent was obtained from the patient. The transesophogeal probe  was passed without difficulty through the esophogus of the patient. Imaged  were obtained with the patient in a  left lateral decubitus position. Local oropharyngeal anesthetic was  provided with Cetacaine. Sedation performed by different physician. The  patient was monitored while under deep sedation. Anesthestetic sedation  was provided intravenously by  Anesthesiology: 260mg  of Propofol, 80mg  of Lidocaine. The patient's vital  signs; including heart rate, blood pressure, and oxygen saturation;  remained stable throughout the procedure. The patient developed no  complications during the procedure.   IMPRESSIONS    1. There is severe prolapse of the A3 scallop with flail segment. There  is severe eccentric MR. 2D PISA 1.4 cm, VC 0.8 cm, 2D ERO 1.02 cm2, Rvol  137 cc. 3D VCA 1.15 cm2, Rvol 154 cc. Flail gap ~5 mm and flail width 15  mm. MVA 5.71 cm2. There is systolic  flow reversal in all 4 pulmonary veins. The left ventricle is mildly  dilated. The mitral valve is abnormal. Severe mitral valve regurgitation.  No evidence of mitral stenosis.  2.  Left ventricular ejection fraction, by estimation, is 60 to 65%. Left  ventricular ejection fraction by 3D volume is 65 %. The left ventricle has  normal function. The left ventricle has no regional wall motion  abnormalities. The left ventricular  internal cavity size was mildly dilated.  3. Right ventricular systolic function is normal. The right ventricular  size is normal.  4. Left atrial size was moderately dilated. No left atrial/left atrial  appendage thrombus was detected.  5. The aortic valve is tricuspid. Aortic valve regurgitation is not  visualized. No aortic stenosis is present.  6. There is mild (Grade II) layered plaque involving the transverse  aorta.  7. Agitated saline contrast bubble study was negative, with no evidence  of any interatrial shunt.   FINDINGS  Left Ventricle: Left ventricular ejection fraction, by estimation, is 60  to 65%. Left ventricular ejection fraction by 3D volume is 65 %. The left  ventricle has normal function. The left ventricle has no regional wall  motion abnormalities. The left  ventricular internal cavity size was mildly dilated. There is no left  ventricular hypertrophy.   Right Ventricle: The right ventricular size is normal. No increase in  right ventricular wall thickness. Right ventricular systolic function is  normal.   Left Atrium: Left atrial size was moderately dilated. No left atrial/left  atrial appendage thrombus was detected.   Right Atrium: Right atrial size was normal in size.   Pericardium: There is no evidence of pericardial effusion.   Mitral Valve: There is severe prolapse of the A3 scallop with flail  segment. There is severe eccentric MR. 2D PISA 1.4 cm, VC 0.8 cm, 2D ERO  1.02 cm2, Rvol 137 cc. 3D VCA 1.15 cm2, Rvol 154 cc. Flail gap ~5 mm and  flail width 15 mm. MVA 5.71 cm2. There  is systolic flow reversal in all 4 pulmonary veins. The left ventricle is  mildly dilated. The mitral valve is abnormal.  Severe mitral valve  regurgitation, with wall-impinging jet. No evidence of mitral valve  stenosis.   Tricuspid Valve: The tricuspid valve is  grossly normal. Tricuspid valve  regurgitation is mild . No evidence of tricuspid stenosis.   Aortic Valve: The aortic valve is tricuspid. Aortic valve regurgitation is  not visualized. No aortic stenosis is present.   Pulmonic Valve: The pulmonic valve was grossly normal. Pulmonic valve  regurgitation is trivial. No evidence of pulmonic stenosis.   Aorta: The aortic root and ascending aorta are structurally normal, with  no evidence of dilitation. There is mild (Grade II) layered plaque  involving the transverse aorta.   Venous: A pattern of systolic flow reversal, suggestive of severe mitral  regurgitation is recorded from the right lower pulmonary vein, the right  upper pulmonary vein, the left lower pulmonary vein and the left upper  pulmonary vein.   IAS/Shunts: No atrial level shunt detected by color flow Doppler. Agitated  saline contrast was given intravenously to evaluate for intracardiac  shunting. Agitated saline contrast bubble study was negative, with no  evidence of any interatrial shunt.     LEFT VENTRICLE  PLAX 2D  LVOT diam:   2.30 cm  LV SV:     85  LV SV Index:  44       3D Volume EF  LVOT Area:   4.15 cm    LV 3D EF:  Left                       ventricular                       ejection                       fraction by                       3D volume                       is 65 %.                   3D Volume EF                 LV 3D EF:  65.00 %                 LV 3D EDV:  156900.00                       mm                 LV 3D ESV:  54900.00                        mm                 LV 3D SV:  102100.00                       mm                   3D Volume EF:                 3D EF:    65 %   AORTIC VALVE  LVOT Vmax:  109.97 cm/s  LVOT Vmean: 69.528 cm/s  LVOT VTI:  0.204 m    AORTA  Ao Root diam: 3.20 cm  Ao Asc diam: 3.00 cm   MR  Peak grad:  93.9 mmHg  TRICUSPID VALVE  MR Mean grad:  60.0 mmHg  TR Peak grad:  30.2 mmHg  MR Vmax:     484.50 cm/s TR Vmax:    275.00 cm/s  MR Vmean:    351.0 cm/s  MR PISA:     12.91 cm  SHUNTS  MR PISA Eff ROA: 103 mm   Systemic VTI: 0.20 m  MR PISA Radius: 1.43 cm   Systemic Diam: 2.30 cm   Eleonore Chiquito MD  Electronically signed by Eleonore Chiquito MD  Signature Date/Time: 12/22/2019/9:44:07 PM    RIGHT/LEFT HEART CATH AND CORONARY ANGIOGRAPHY  Conclusion    Hemodynamic findings consistent with mitral valve regurgitation.  1. Patent coronary arteries with mild coronary ectasia and nonobstructive plaquing, no high-grade stenoses present 2. Normal right heart hemodynamics and preserved cardiac output 3. 24 mm V waves consistent with hemodynamically significant mitral regurgitation  Surgeon Notes    12/22/2019 2:17 PM [facility] Procedure signed by Geralynn Rile, MD  Indications  Severe mitral regurgitation [I34.0 (ICD-10-CM)]  Procedural Details  Technical Details INDICATION: Severe Primary Mitral Regurgitation  PROCEDURAL DETAILS: There was an indwelling IV in a right antecubital vein. Using normal sterile technique, the IV was changed out for a 5 Fr brachial sheath over a 0.018 inch wire. The right wrist was then prepped, draped, and anesthetized with 1% lidocaine. Using the modified Seldinger technique a 5/6 French Slender sheath was placed in the right radial artery. Intra-arterial verapamil was administered through the radial artery sheath. IV heparin was  administered after a JR4 catheter was advanced into the central aorta. A Swan-Ganz catheter was used for the right heart catheterization. Standard protocol was followed for recording of right heart pressures and sampling of oxygen saturations. Fick cardiac output was calculated. Standard Judkins catheters were used for selective coronary angiography. LV pressure is recorded and an aortic valve pullback is performed. There were no immediate procedural complications. The patient was transferred to the post catheterization recovery area for further monitoring.    Estimated blood loss <50 mL.   During this procedure medications were administered to achieve and maintain moderate conscious sedation while the patient's heart rate, blood pressure, and oxygen saturation were continuously monitored and I was present face-to-face 100% of this time.  Medications (Filter: Administrations occurring from [id number] to 1547 on 12/22/19) (important) Continuous medications are totaled by the amount administered until 12/22/19 1547.  Heparin (Porcine) in NaCl 1000-0.9 UT/500ML-% SOLN (mL) Total volume:  1,000 mL Date/Time  Rate/Dose/Volume Action  12/22/19 1504  500 mL Given  1504  500 mL Given    fentaNYL (SUBLIMAZE) injection (mcg) Total dose:  25 mcg Date/Time  Rate/Dose/Volume Action  12/22/19 1510  25 mcg Given    midazolam (VERSED) injection (mg) Total dose:  1 mg Date/Time  Rate/Dose/Volume Action  12/22/19 1511  1 mg Given    lidocaine (PF) (XYLOCAINE) 1 % injection (mL) Total volume:  3 mL Date/Time  Rate/Dose/Volume Action  12/22/19 1525  3 mL Given    Radial Cocktail/Verapamil only (mL) Total volume:  10 mL Date/Time  Rate/Dose/Volume Action  12/22/19 1526  10 mL Given    heparin sodium (porcine) injection (Units) Total dose:  4,000 Units Date/Time  Rate/Dose/Volume Action  12/22/19 1533  4,000 Units Given    iohexol (OMNIPAQUE) 350 MG/ML injection (mL) Total volume:  30  mL Date/Time  Rate/Dose/Volume Action  12/22/19 1546  30 mL Given    Sedation Time  Sedation Time Physician-1:  28 minutes 17 seconds  Contrast  Medication Name Total Dose  iohexol (OMNIPAQUE) 350 MG/ML injection 30 mL    Radiation/Fluoro  Fluoro time: 2.7 (min) DAP: 23302 (mGycm2) Cumulative Air Kerma: 427 (mGy)  Coronary Findings  Diagnostic Dominance: Right Left Anterior Descending  Vessel was injected. Vessel is large. There is mild diffuse disease throughout the vessel. The LAD is patent to the apex. The diagonal branches are patent without significant stenosis. There is some hypodensity in the mid LAD at the origin of the second diagonal, likely secondary to nonobstructive plaquing. There are no significant stenoses throughout the LAD territory.  Left Circumflex  Vessel is large. The vessel exhibits minimal luminal irregularities. The vessel is ectatic. The circumflex is large in caliber, the mid vessel is ectatic. The intermediate branch is patent with no stenosis. There were no significant stenoses throughout the circumflex distribution.  Right Coronary Artery  Vessel was injected. Vessel is very large. There is mild diffuse disease throughout the vessel. The vessel is ectatic. Large, dominant vessel. The vessel supplies an acute marginal branch, PDA branch, and 2 posterolateral branches. Mid RCA exhibits ectasia. There are diffuse irregularities with no significant stenoses present.  Intervention  No interventions have been documented. Right Heart  Right Heart Pressures Hemodynamic findings consistent with mitral valve regurgitation.  Coronary Diagrams  Diagnostic Dominance: Right  Intervention  Implants   No implant documentation for this case.  Syngo Images  Show images for CARDIAC CATHETERIZATION Images on Long Term Storage  Show images for Bienvenido, Novosel to Procedure Log  Procedure Log    Hemo Data   Most Recent Value  Fick  Cardiac Output 5.65 L/min  Fick Cardiac Output Index 2.93 (L/min)/BSA  RA A Wave 7 mmHg  RA V Wave 5 mmHg  RA Mean 3 mmHg  RV Systolic Pressure 27 mmHg  RV Diastolic Pressure 2 mmHg  RV EDP 5 mmHg  PA Systolic Pressure 29 mmHg  PA Diastolic Pressure 6 mmHg  PA Mean 16 mmHg  PW A Wave 15 mmHg  PW V Wave 24 mmHg  PW Mean 12 mmHg  AO Systolic Pressure 96 mmHg  AO Diastolic Pressure 57 mmHg  AO Mean 76 mmHg  LV Systolic Pressure 123XX123 mmHg  LV Diastolic Pressure 4 mmHg  LV EDP 9 mmHg  AOp Systolic Pressure XX123456 mmHg  AOp Diastolic Pressure 67 mmHg  AOp Mean Pressure 85 mmHg  LVp Systolic Pressure XX123456 mmHg  LVp Diastolic Pressure 4 mmHg  LVp EDP Pressure 7 mmHg  QP/QS 1  TPVR Index 5.45 HRUI  TSVR Index 25.91 HRUI  PVR SVR Ratio 0.05  TPVR/TSVR Ratio 0.21        CT ANGIOGRAPHY CHEST, ABDOMEN AND PELVIS  TECHNIQUE: Non-contrast CT of the chest was initially obtained.  Multidetector CT imaging through the chest, abdomen and pelvis was performed using the standard protocol during bolus administration of intravenous contrast. Multiplanar reconstructed images and MIPs were obtained and reviewed to evaluate the vascular anatomy.  CONTRAST: 103mL ISOVUE-370 IOPAMIDOL (ISOVUE-370) INJECTION 76%  COMPARISON: 12/16/2014  FINDINGS: CTA CHEST FINDINGS  Cardiovascular:  Heart:  Heart size borderline enlarged. No pericardial fluid/thickening. No significant coronary calcifications.  Aorta:  Unremarkable course, caliber, contour of the thoracic aorta. No aneurysm or dissection flap. No periaortic fluid. Minimal atherosclerosis of the aortic arch. Four vessel arch with the left vertebral artery originating between left common carotid artery and the left subclavian artery. Cervical vessels patent at the base of the neck.  Diameter of the thoracic aorta 2.5 cm at the hiatus.  Pulmonary arteries:  Timing of the contrast bolus is not optimized for  evaluation of pulmonary artery filling defects.  Mediastinum/Nodes: No mediastinal adenopathy. Unremarkable appearance of the thoracic esophagus.  Unremarkable appearance of the thoracic inlet.  Lungs/Pleura: Central airways are clear. No pleural effusion. No confluent airspace disease.  No pneumothorax. Calcified granuloma at the posterior right lower lobe, superior segment.  CTA ABDOMEN AND PELVIS FINDINGS  VASCULAR  Aorta: Unremarkable course, caliber, contour of the abdominal aorta. No dissection, aneurysm, or periaortic fluid.  Celiac: Patent, with no significant atherosclerotic changes.  SMA: Patent, with no significant atherosclerotic changes.  Renals:  - Right: Right renal artery patent.  - Left: Left renal artery patent.  IMA: Inferior mesenteric artery is patent.  Right lower extremity:  Unremarkable course, caliber, and contour of the right iliac system. No aneurysm, dissection, or occlusion. Hypogastric artery is patent. High bifurcation on the right. Common femoral artery patent. Proximal SFA and profunda femoris patent.  Left lower extremity:  Unremarkable course, caliber, and contour of the left iliac system. No aneurysm, dissection, or occlusion. Hypogastric artery is patent. High bifurcation of the left. Common femoral artery patent. Proximal SFA and profunda femoris patent.  Veins: Unremarkable appearance of the venous system.  Review of the MIP images confirms the above findings.  NON-VASCULAR  Hepatobiliary: Unremarkable appearance of the liver. Unremarkable gall bladder.  Pancreas: Unremarkable.  Spleen: Unremarkable.  Adrenals/Urinary Tract:  - Right adrenal gland: Unremarkable  - Left adrenal gland: Unremarkable.  - Right kidney: No hydronephrosis, nephrolithiasis, inflammation, or ureteral dilation. Rounded lesion of the superomedial cortex 2.4 cm, with fluid density Hounsfield units, too small to  characterize though most likely benign. Large cystic lesion on the inferior cortex, compatible with benign cyst.  - Left Kidney: No hydronephrosis, nephrolithiasis, inflammation, or ureteral dilation. Small fluid density cystic lesion on the posterior cortex of the left kidney, most likely a benign cyst.  - Urinary Bladder: Urinary bladder is decompressed.  Stomach/Bowel:  - Stomach: Unremarkable.  - Small bowel: Unremarkable  - Appendix: Normal.  - Colon: Colonic diverticular disease transverse colon and left colon. No acute inflammatory changes.  Lymphatic: No adenopathy.  Mesenteric: No free fluid or air. No mesenteric adenopathy.  Reproductive: Unremarkable appearance of the pelvic organs.  Other: No hernia.  Musculoskeletal: No acute displaced fracture. Degenerative changes of the visualized spine. No significant bony canal narrowing.  IMPRESSION: Negative for acute abnormality.  High bifurcation of the bilateral common femoral artery.  Additional ancillary findings as above.  Signed,  Dulcy Fanny. Dellia Nims, RPVI  [facility]  [facility]   Electronically Signed By: Corrie Mckusick D.O. On: 01/12/2020 11:37     Impression:  Patient has mitral valve prolapse with stage C severe asymptomatic primary mitral regurgitation. I have personally reviewed the patient's recent transthoracic and transesophageal echocardiograms. 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 Left ventricular function appears normal. There is moderate left atrial enlargement. The patient remains remarkably asymptomatic despite the magnitude of regurgitation.  Options include continued observation  with close follow-up versus elective mitral valve repair. Based upon my experience and review of the patient's clinical history and diagnostic tests I feel that risks associated with elective mitral valve repair should be associated with with less than 1% risk of  operative mortality and greater than 98% likelihood of successful and durable mitral valve repair. Patientappears tobe a good candidate for minimally invasive approach for surgery.   Plan:  The patientand his wife wereagaincounseled at length regarding the indications, risks and potential benefits of mitral valve repair. The rationale for elective surgery has been explained, including a comparison between surgery and continued medical therapy with close follow-up. The likelihood of successful and durable mitral valve repair has been discussed with particular reference to the findings of their recent echocardiogram. Based upon these findings and previous experience, I have quoted them a greater than 98percent likelihood of successful valve repair with less than 1percent risk of mortality or major morbidity. Alternative surgical approaches have been discussed including a comparison between conventional sternotomy and minimally-invasive techniques. The relative risks and benefits of each have been reviewed as they pertain to the patient's specific circumstances, and expectations for the patient's postoperative convalescence has been discussed. The patient desires to proceed with surgeryon2/10/2020.  The patientand his wifeunderstand and accept all potential risks of surgery including but not limited to risk of death, stroke or other neurologic complication, myocardial infarction, congestive heart failure, respiratory failure, renal failure, bleeding requiring transfusion and/or reexploration, arrhythmia, heart block or bradycardia requiring permanent pacemaker insertion, infection or other wound complications, pneumonia, pleural  and/or pericardial effusion, pulmonary embolus, aortic dissection or other major vascular complication, or other immediate or delayed complications related to valve repair or replacement including but not limited to recurrent or persistent mitral regurgitation and/or mitral stenosis, LV outflow tract obstruction, aortic insufficiency, paravalvular leak, posterior AV groove disruption, structural valve deterioration and failure, thrombosis, embolization, or endocarditis. Specific risks potentially related to the minimally-invasive approach were discussed at length, including but not limited to risk of conversion to full or partial sternotomy, aortic dissection or other major vascular complication, unilateral acute lung injury or pulmonary edema, phrenic nerve dysfunction or paralysis, rib fracture, chronic pain, lung hernia, or lymphocele. All of theirquestions have been answered.      Valentina Gu. Roxy Manns, MD 03/27/2020 11:21 AM

## 2020-04-10 NOTE — Progress Notes (Signed)
Anesthesia Chart Review:  Case: [id number] Date/Time: 04/11/20 0715   Procedures:      MINIMALLY INVASIVE MITRAL VALVE REPAIR (MVR) (Right Chest)     TRANSESOPHAGEAL ECHOCARDIOGRAM (TEE) (N/A )   Anesthesia type: General   Pre-op diagnosis: MR   Location: [facility] OR ROOM 15 / [place]   Surgeons: Rexene Alberts, MD      DISCUSSION: Patient is a 70 year old male scheduled for the above procedure. He has been postponed a few times for different reasons including fall, COVID-19 02/03/20, infected tooth extraction, and elective surgeon restrictions.  History includes never smoker, murmur (severe mitral regurgitation), HTN, thyroid nodule, anxiety, GERD, fatty liver, colonic varices, IBS, anemia, thrombocytopenia, prostate cancer (s/p prostatectomy 10/13/08).  Patient tested POSITIVE for Covid on 02/03/2020 at [facility] in [place] (copy on chart), so he will not be retested prior to surgery.   Anesthesia team to evaluate on the day of surgery.    VS: BP 135/90   Pulse 76   Temp 37 C   Resp 19   Ht 5\' 9"  (1.753 m)   Wt 76.3 kg   SpO2 100%   BMI 24.84 kg/m     PROVIDERS: Prince Solian, MD is PCP  Eleonore Chiquito, MD is cardiologist Lucio Edward, MD is GI. EGD 07/14/18 and colonoscopy 03/11/18.    LABS: Labs reviewed: Acceptable for surgery. PLT 134K (all labs ordered are listed, but only abnormal results are displayed)  Labs Reviewed  CBC - Abnormal; Notable for the following components:      Result Value   Platelets 134 (*)    All other components within normal limits  COMPREHENSIVE METABOLIC PANEL - Abnormal; Notable for the following components:   Glucose, Bld 110 (*)    All other components within normal limits  BLOOD GAS, ARTERIAL - Abnormal; Notable for the following components:   pH, Arterial 7.485 (*)    pO2, Arterial 117 (*)    All other components within normal limits  SURGICAL PCR SCREEN  PROTIME-INR  APTT  URINALYSIS, ROUTINE W  REFLEX MICROSCOPIC  HEMOGLOBIN A1C  TYPE AND SCREEN     IMAGES: CXR 04/07/20: FINDINGS: The heart size and mediastinal contours are within normal limits. Both lungs are clear. The visualized skeletal structures are unremarkable. IMPRESSION: No active cardiopulmonary disease.   EKG: 03/13/20: Normal sinus rhythm Possible Left atrial enlargement Minimal voltage criteria for LVH, may be normal variant ( R in aVL ) Borderline ECG No significant change since last tracing Confirmed by Oswaldo Milian [phone]) on 03/13/2020 8:59:41 PM   CV: Carotid US 03/13/20: Summary:  - Right Carotid: Velocities in the right ICA are consistent with a 1-39% stenosis.  - Left Carotid: Velocities in the left ICA are consistent with a 1-39% stenosis.  - Vertebrals: Bilateral vertebral arteries demonstrate antegrade flow.  - Subclavians: Normal flow hemodynamics were seen in bilateral subclavian arteries.    TEE 12/22/19: IMPRESSIONS  1. There is severe prolapse of the A3 scallop with flail segment. There  is severe eccentric MR. 2D PISA 1.4 cm, VC 0.8 cm, 2D ERO 1.02 cm2, Rvol  137 cc. 3D VCA 1.15 cm2, Rvol 154 cc. Flail gap ~5 mm and flail width 15  mm. MVA 5.71 cm2. There is systolic  flow reversal in all 4 pulmonary veins. The left ventricle is mildly  dilated. The mitral valve is abnormal. Severe mitral valve regurgitation.  No evidence of mitral stenosis.  2. Left ventricular ejection fraction, by  estimation, is 60 to 65%. Left  ventricular ejection fraction by 3D volume is 65 %. The left ventricle has  normal function. The left ventricle has no regional wall motion  abnormalities. The left ventricular  internal cavity size was mildly dilated.  3. Right ventricular systolic function is normal. The right ventricular  size is normal.  4. Left atrial size was moderately dilated. No left atrial/left atrial  appendage thrombus was detected.  5. The aortic valve is tricuspid. Aortic  valve regurgitation is not  visualized. No aortic stenosis is present.  6. There is mild (Grade II) layered plaque involving the transverse  aorta.  7. Agitated saline contrast bubble study was negative, with no evidence  of any interatrial shunt.    Cardiac cath 12/22/19:  Hemodynamic findings consistent with mitral valve regurgitation.   1.  Patent coronary arteries with mild coronary ectasia and nonobstructive plaquing, no high-grade stenoses present 2.  Normal right heart hemodynamics and preserved cardiac output 3.  24 mm V waves consistent with hemodynamically significant mitral regurgitation   Past Medical History:  Diagnosis Date  . Adenomatous polyp of colon 02/2004  . Anemia   . Anxiety   . Arthritis   . BPH (benign prostatic hyperplasia)   . Colonic varices   . Degenerative joint disease   . Depression   . Depression   . Fatty liver   . GERD (gastroesophageal reflux disease)   . Heart murmur   . Hemorrhoids   . Hyperlipidemia   . Hypertension   . IBS (irritable bowel syndrome)   . Prostate cancer ([place]) 2011  . Thrombocytopenia ([place])   . Thyroid nodule     Past Surgical History:  Procedure Laterality Date  . BUBBLE STUDY  12/22/2019   Procedure: BUBBLE STUDY;  Surgeon: Geralynn Rile, MD;  Location: [place];  Service: Cardiovascular;;  . CARDIAC CATHETERIZATION    . COLONOSCOPY    . PROSTATECTOMY    . RIGHT/LEFT HEART CATH AND CORONARY ANGIOGRAPHY N/A 12/22/2019   Procedure: RIGHT/LEFT HEART CATH AND CORONARY ANGIOGRAPHY;  Surgeon: [name], MD;  Location: [place] CV [facility];  Service: Cardiovascular;  Laterality: N/A;  . TEE WITHOUT CARDIOVERSION N/A 12/22/2019   Procedure: TRANSESOPHAGEAL ECHOCARDIOGRAM (TEE);  Surgeon: Geralynn Rile, MD;  Location: [place];  Service: Cardiovascular;  Laterality: N/A;  . TONSILLECTOMY    . TOOTH EXTRACTION  03/07/2020  . TYMPANOPLASTY  01/2016    MEDICATIONS: . aspirin EC 81 MG  tablet  . buPROPion (WELLBUTRIN XL) 150 MG 24 hr tablet  . dicyclomine (BENTYL) 10 MG capsule  . escitalopram (LEXAPRO) 5 MG tablet  . hydroxypropyl methylcellulose / hypromellose (ISOPTO TEARS / GONIOVISC) 2.5 % ophthalmic solution  . irbesartan (AVAPRO) 150 MG tablet  . pantoprazole (PROTONIX) 40 MG tablet  . sildenafil (VIAGRA) 100 MG tablet  . traZODone (DESYREL) 50 MG tablet   No current facility-administered medications for this encounter.    Myra Gianotti, PA-C Surgical Short Stay/Anesthesiology [facility] Phone [phone] [facility] Phone [phone] 04/10/2020 10:52 AM

## 2020-04-10 NOTE — Anesthesia Preprocedure Evaluation (Addendum)
Anesthesia Evaluation  Patient identified by MRN, date of birth, ID band Patient awake    Reviewed: Allergy & Precautions, NPO status , Patient's Chart, lab work & pertinent test results  Airway Mallampati: II  TM Distance: >3 FB Neck ROM: Full    Dental  (+) Teeth Intact, Dental Advisory Given   Pulmonary    breath sounds clear to auscultation       Cardiovascular hypertension,  Rhythm:Regular + Systolic murmurs    Neuro/Psych    GI/Hepatic   Endo/Other    Renal/GU      Musculoskeletal   Abdominal   Peds  Hematology   Anesthesia Other Findings   Reproductive/Obstetrics                            Anesthesia Physical Anesthesia Plan  ASA: III  Anesthesia Plan: General   Post-op Pain Management:    Induction: Intravenous  PONV Risk Score and Plan: Ondansetron and Dexamethasone  Airway Management Planned: Double Lumen EBT  Additional Equipment: Arterial line, 3D TEE, Ultrasound Guidance Line Placement, PA Cath and CVP  Intra-op Plan:   Post-operative Plan: Possible Post-op intubation/ventilation  Informed Consent: I have reviewed the patients History and Physical, chart, labs and discussed the procedure including the risks, benefits and alternatives for the proposed anesthesia with the patient or authorized representative who has indicated his/her understanding and acceptance.     Dental advisory given  Plan Discussed with: CRNA and Anesthesiologist  Anesthesia Plan Comments: (PAT note written 04/10/2020 by Myra Gianotti, PA-C. )       Anesthesia Quick Evaluation

## 2020-04-11 ENCOUNTER — Inpatient Hospital Stay (HOSPITAL_COMMUNITY): Payer: PPO

## 2020-04-11 ENCOUNTER — Other Ambulatory Visit: Payer: Self-pay

## 2020-04-11 ENCOUNTER — Inpatient Hospital Stay (HOSPITAL_COMMUNITY): Payer: PPO | Admitting: Certified Registered Nurse Anesthetist

## 2020-04-11 ENCOUNTER — Encounter (HOSPITAL_COMMUNITY)
Admission: RE | Disposition: A | Discharge status: Home / Self Care | Payer: Self-pay | Source: Home / Self Care | Attending: Thoracic Surgery (Cardiothoracic Vascular Surgery)

## 2020-04-11 ENCOUNTER — Inpatient Hospital Stay (HOSPITAL_COMMUNITY)
Admission: RE | Admit: 2020-04-11 | Discharge: 2020-04-15 | DRG: 219 | Disposition: A | Discharge status: Home / Self Care | Payer: PPO | Attending: Thoracic Surgery (Cardiothoracic Vascular Surgery) | Admitting: Thoracic Surgery (Cardiothoracic Vascular Surgery)

## 2020-04-11 ENCOUNTER — Encounter (HOSPITAL_COMMUNITY): Payer: Self-pay | Admitting: Thoracic Surgery (Cardiothoracic Vascular Surgery)

## 2020-04-11 DIAGNOSIS — I081 Rheumatic disorders of both mitral and tricuspid valves: Principal | ICD-10-CM | POA: Diagnosis present

## 2020-04-11 DIAGNOSIS — Z8546 Personal history of malignant neoplasm of prostate: Secondary | ICD-10-CM

## 2020-04-11 DIAGNOSIS — I10 Essential (primary) hypertension: Secondary | ICD-10-CM | POA: Diagnosis not present

## 2020-04-11 DIAGNOSIS — Z7982 Long term (current) use of aspirin: Secondary | ICD-10-CM | POA: Diagnosis not present

## 2020-04-11 DIAGNOSIS — Z8371 Family history of colonic polyps: Secondary | ICD-10-CM | POA: Diagnosis not present

## 2020-04-11 DIAGNOSIS — Z9889 Other specified postprocedural states: Secondary | ICD-10-CM

## 2020-04-11 DIAGNOSIS — J9811 Atelectasis: Secondary | ICD-10-CM | POA: Diagnosis not present

## 2020-04-11 DIAGNOSIS — J90 Pleural effusion, not elsewhere classified: Secondary | ICD-10-CM | POA: Diagnosis not present

## 2020-04-11 DIAGNOSIS — Z823 Family history of stroke: Secondary | ICD-10-CM | POA: Diagnosis not present

## 2020-04-11 DIAGNOSIS — Z79899 Other long term (current) drug therapy: Secondary | ICD-10-CM | POA: Diagnosis not present

## 2020-04-11 DIAGNOSIS — K589 Irritable bowel syndrome without diarrhea: Secondary | ICD-10-CM | POA: Diagnosis not present

## 2020-04-11 DIAGNOSIS — E079 Disorder of thyroid, unspecified: Secondary | ICD-10-CM | POA: Diagnosis not present

## 2020-04-11 DIAGNOSIS — Z86010 Personal history of colonic polyps: Secondary | ICD-10-CM

## 2020-04-11 DIAGNOSIS — E785 Hyperlipidemia, unspecified: Secondary | ICD-10-CM | POA: Diagnosis present

## 2020-04-11 DIAGNOSIS — I517 Cardiomegaly: Secondary | ICD-10-CM | POA: Diagnosis not present

## 2020-04-11 DIAGNOSIS — Z818 Family history of other mental and behavioral disorders: Secondary | ICD-10-CM | POA: Diagnosis not present

## 2020-04-11 DIAGNOSIS — F329 Major depressive disorder, single episode, unspecified: Secondary | ICD-10-CM | POA: Diagnosis present

## 2020-04-11 DIAGNOSIS — Z8249 Family history of ischemic heart disease and other diseases of the circulatory system: Secondary | ICD-10-CM

## 2020-04-11 DIAGNOSIS — I340 Nonrheumatic mitral (valve) insufficiency: Secondary | ICD-10-CM | POA: Diagnosis not present

## 2020-04-11 DIAGNOSIS — D62 Acute posthemorrhagic anemia: Secondary | ICD-10-CM | POA: Diagnosis not present

## 2020-04-11 DIAGNOSIS — D696 Thrombocytopenia, unspecified: Secondary | ICD-10-CM | POA: Diagnosis not present

## 2020-04-11 DIAGNOSIS — K760 Fatty (change of) liver, not elsewhere classified: Secondary | ICD-10-CM | POA: Diagnosis not present

## 2020-04-11 DIAGNOSIS — K219 Gastro-esophageal reflux disease without esophagitis: Secondary | ICD-10-CM | POA: Diagnosis present

## 2020-04-11 DIAGNOSIS — N400 Enlarged prostate without lower urinary tract symptoms: Secondary | ICD-10-CM | POA: Diagnosis present

## 2020-04-11 DIAGNOSIS — I511 Rupture of chordae tendineae, not elsewhere classified: Secondary | ICD-10-CM | POA: Diagnosis present

## 2020-04-11 DIAGNOSIS — F419 Anxiety disorder, unspecified: Secondary | ICD-10-CM | POA: Diagnosis present

## 2020-04-11 DIAGNOSIS — D649 Anemia, unspecified: Secondary | ICD-10-CM | POA: Diagnosis not present

## 2020-04-11 HISTORY — DX: Other specified postprocedural states: Z98.890

## 2020-04-11 HISTORY — PX: MITRAL VALVE REPAIR: SHX2039

## 2020-04-11 HISTORY — PX: TEE WITHOUT CARDIOVERSION: SHX5443

## 2020-04-11 LAB — CBC
HCT: 34.7 % — ABNORMAL LOW (ref 39.0–52.0)
HCT: 35.3 % — ABNORMAL LOW (ref 39.0–52.0)
HCT: 35.4 % — ABNORMAL LOW (ref 39.0–52.0)
Hemoglobin: 10.9 g/dL — ABNORMAL LOW (ref 13.0–17.0)
Hemoglobin: 11.2 g/dL — ABNORMAL LOW (ref 13.0–17.0)
Hemoglobin: 11.4 g/dL — ABNORMAL LOW (ref 13.0–17.0)
MCH: 30 pg (ref 26.0–34.0)
MCH: 29.9 pg (ref 26.0–34.0)
MCH: 30.4 pg (ref 26.0–34.0)
MCHC: 31.4 g/dL (ref 30.0–36.0)
MCHC: 31.7 g/dL (ref 30.0–36.0)
MCHC: 32.2 g/dL (ref 30.0–36.0)
MCV: 95.6 fL (ref 80.0–100.0)
MCV: 94.4 fL (ref 80.0–100.0)
MCV: 94.4 fL (ref 80.0–100.0)
Platelets: UNDETERMINED 10*3/uL (ref 150–400)
Platelets: UNDETERMINED 10*3/uL (ref 150–400)
Platelets: 54 10*3/uL — ABNORMAL LOW (ref 150–400)
RBC: 3.63 MIL/uL — ABNORMAL LOW (ref 4.22–5.81)
RBC: 3.74 MIL/uL — ABNORMAL LOW (ref 4.22–5.81)
RBC: 3.75 MIL/uL — ABNORMAL LOW (ref 4.22–5.81)
RDW: 13.1 % (ref 11.5–15.5)
RDW: 12.9 % (ref 11.5–15.5)
RDW: 13 % (ref 11.5–15.5)
WBC: 9.7 10*3/uL (ref 4.0–10.5)
WBC: 9.3 10*3/uL (ref 4.0–10.5)
WBC: 9 10*3/uL (ref 4.0–10.5)
nRBC: 0 % (ref 0.0–0.2)
nRBC: 0 % (ref 0.0–0.2)
nRBC: 0 % (ref 0.0–0.2)

## 2020-04-11 LAB — POCT I-STAT 7, (LYTES, BLD GAS, ICA,H+H)
Acid-Base Excess: 4 mmol/L — ABNORMAL HIGH (ref 0.0–2.0)
Acid-Base Excess: 2 mmol/L (ref 0.0–2.0)
Acid-Base Excess: 2 mmol/L (ref 0.0–2.0)
Acid-Base Excess: 1 mmol/L (ref 0.0–2.0)
Acid-base deficit: 1 mmol/L (ref 0.0–2.0)
Bicarbonate: 28.9 mmol/L — ABNORMAL HIGH (ref 20.0–28.0)
Bicarbonate: 27.1 mmol/L (ref 20.0–28.0)
Bicarbonate: 27.5 mmol/L (ref 20.0–28.0)
Bicarbonate: 23.2 mmol/L (ref 20.0–28.0)
Bicarbonate: 27.1 mmol/L (ref 20.0–28.0)
Calcium, Ion: 1.2 mmol/L (ref 1.15–1.40)
Calcium, Ion: 1.06 mmol/L — ABNORMAL LOW (ref 1.15–1.40)
Calcium, Ion: 1.1 mmol/L — ABNORMAL LOW (ref 1.15–1.40)
Calcium, Ion: 1.14 mmol/L — ABNORMAL LOW (ref 1.15–1.40)
Calcium, Ion: 1.18 mmol/L (ref 1.15–1.40)
HCT: 36 % — ABNORMAL LOW (ref 39.0–52.0)
HCT: 30 % — ABNORMAL LOW (ref 39.0–52.0)
HCT: 28 % — ABNORMAL LOW (ref 39.0–52.0)
HCT: 28 % — ABNORMAL LOW (ref 39.0–52.0)
HCT: 32 % — ABNORMAL LOW (ref 39.0–52.0)
Hemoglobin: 12.2 g/dL — ABNORMAL LOW (ref 13.0–17.0)
Hemoglobin: 10.2 g/dL — ABNORMAL LOW (ref 13.0–17.0)
Hemoglobin: 9.5 g/dL — ABNORMAL LOW (ref 13.0–17.0)
Hemoglobin: 9.5 g/dL — ABNORMAL LOW (ref 13.0–17.0)
Hemoglobin: 10.9 g/dL — ABNORMAL LOW (ref 13.0–17.0)
O2 Saturation: 100 %
O2 Saturation: 100 %
O2 Saturation: 100 %
O2 Saturation: 100 %
O2 Saturation: 91 %
Patient temperature: 35.7
Potassium: 3.7 mmol/L (ref 3.5–5.1)
Potassium: 4 mmol/L (ref 3.5–5.1)
Potassium: 4 mmol/L (ref 3.5–5.1)
Potassium: 3.8 mmol/L (ref 3.5–5.1)
Potassium: 4.1 mmol/L (ref 3.5–5.1)
Sodium: 142 mmol/L (ref 135–145)
Sodium: 141 mmol/L (ref 135–145)
Sodium: 141 mmol/L (ref 135–145)
Sodium: 142 mmol/L (ref 135–145)
Sodium: 142 mmol/L (ref 135–145)
TCO2: 30 mmol/L (ref 22–32)
TCO2: 28 mmol/L (ref 22–32)
TCO2: 29 mmol/L (ref 22–32)
TCO2: 24 mmol/L (ref 22–32)
TCO2: 29 mmol/L (ref 22–32)
pCO2 arterial: 46 mmHg (ref 32.0–48.0)
pCO2 arterial: 45.7 mmHg (ref 32.0–48.0)
pCO2 arterial: 44.5 mmHg (ref 32.0–48.0)
pCO2 arterial: 36.4 mmHg (ref 32.0–48.0)
pCO2 arterial: 46.7 mmHg (ref 32.0–48.0)
pH, Arterial: 7.406 (ref 7.350–7.450)
pH, Arterial: 7.381 (ref 7.350–7.450)
pH, Arterial: 7.399 (ref 7.350–7.450)
pH, Arterial: 7.413 (ref 7.350–7.450)
pH, Arterial: 7.365 (ref 7.350–7.450)
pO2, Arterial: 537 mmHg — ABNORMAL HIGH (ref 83.0–108.0)
pO2, Arterial: 379 mmHg — ABNORMAL HIGH (ref 83.0–108.0)
pO2, Arterial: 384 mmHg — ABNORMAL HIGH (ref 83.0–108.0)
pO2, Arterial: 271 mmHg — ABNORMAL HIGH (ref 83.0–108.0)
pO2, Arterial: 60 mmHg — ABNORMAL LOW (ref 83.0–108.0)

## 2020-04-11 LAB — POCT I-STAT, CHEM 8
BUN: 14 mg/dL (ref 8–23)
BUN: 14 mg/dL (ref 8–23)
BUN: 12 mg/dL (ref 8–23)
BUN: 12 mg/dL (ref 8–23)
BUN: 10 mg/dL (ref 8–23)
BUN: 12 mg/dL (ref 8–23)
Calcium, Ion: 1.21 mmol/L (ref 1.15–1.40)
Calcium, Ion: 1.22 mmol/L (ref 1.15–1.40)
Calcium, Ion: 1.06 mmol/L — ABNORMAL LOW (ref 1.15–1.40)
Calcium, Ion: 1.11 mmol/L — ABNORMAL LOW (ref 1.15–1.40)
Calcium, Ion: 1.07 mmol/L — ABNORMAL LOW (ref 1.15–1.40)
Calcium, Ion: 1.14 mmol/L — ABNORMAL LOW (ref 1.15–1.40)
Chloride: 104 mmol/L (ref 98–111)
Chloride: 104 mmol/L (ref 98–111)
Chloride: 104 mmol/L (ref 98–111)
Chloride: 105 mmol/L (ref 98–111)
Chloride: 105 mmol/L (ref 98–111)
Chloride: 106 mmol/L (ref 98–111)
Creatinine, Ser: 0.5 mg/dL — ABNORMAL LOW (ref 0.61–1.24)
Creatinine, Ser: 0.6 mg/dL — ABNORMAL LOW (ref 0.61–1.24)
Creatinine, Ser: 0.5 mg/dL — ABNORMAL LOW (ref 0.61–1.24)
Creatinine, Ser: 0.5 mg/dL — ABNORMAL LOW (ref 0.61–1.24)
Creatinine, Ser: 0.5 mg/dL — ABNORMAL LOW (ref 0.61–1.24)
Creatinine, Ser: 0.5 mg/dL — ABNORMAL LOW (ref 0.61–1.24)
Glucose, Bld: 107 mg/dL — ABNORMAL HIGH (ref 70–99)
Glucose, Bld: 141 mg/dL — ABNORMAL HIGH (ref 70–99)
Glucose, Bld: 133 mg/dL — ABNORMAL HIGH (ref 70–99)
Glucose, Bld: 125 mg/dL — ABNORMAL HIGH (ref 70–99)
Glucose, Bld: 120 mg/dL — ABNORMAL HIGH (ref 70–99)
Glucose, Bld: 121 mg/dL — ABNORMAL HIGH (ref 70–99)
HCT: 36 % — ABNORMAL LOW (ref 39.0–52.0)
HCT: 33 % — ABNORMAL LOW (ref 39.0–52.0)
HCT: 28 % — ABNORMAL LOW (ref 39.0–52.0)
HCT: 27 % — ABNORMAL LOW (ref 39.0–52.0)
HCT: 26 % — ABNORMAL LOW (ref 39.0–52.0)
HCT: 27 % — ABNORMAL LOW (ref 39.0–52.0)
Hemoglobin: 12.2 g/dL — ABNORMAL LOW (ref 13.0–17.0)
Hemoglobin: 11.2 g/dL — ABNORMAL LOW (ref 13.0–17.0)
Hemoglobin: 9.5 g/dL — ABNORMAL LOW (ref 13.0–17.0)
Hemoglobin: 9.2 g/dL — ABNORMAL LOW (ref 13.0–17.0)
Hemoglobin: 8.8 g/dL — ABNORMAL LOW (ref 13.0–17.0)
Hemoglobin: 9.2 g/dL — ABNORMAL LOW (ref 13.0–17.0)
Potassium: 3.7 mmol/L (ref 3.5–5.1)
Potassium: 3.8 mmol/L (ref 3.5–5.1)
Potassium: 4 mmol/L (ref 3.5–5.1)
Potassium: 3.9 mmol/L (ref 3.5–5.1)
Potassium: 3.8 mmol/L (ref 3.5–5.1)
Potassium: 4.2 mmol/L (ref 3.5–5.1)
Sodium: 143 mmol/L (ref 135–145)
Sodium: 141 mmol/L (ref 135–145)
Sodium: 140 mmol/L (ref 135–145)
Sodium: 142 mmol/L (ref 135–145)
Sodium: 141 mmol/L (ref 135–145)
Sodium: 142 mmol/L (ref 135–145)
TCO2: 28 mmol/L (ref 22–32)
TCO2: 23 mmol/L (ref 22–32)
TCO2: 27 mmol/L (ref 22–32)
TCO2: 27 mmol/L (ref 22–32)
TCO2: 24 mmol/L (ref 22–32)
TCO2: 25 mmol/L (ref 22–32)

## 2020-04-11 LAB — POCT I-STAT EG7
Acid-Base Excess: 1 mmol/L (ref 0.0–2.0)
Bicarbonate: 27.3 mmol/L (ref 20.0–28.0)
Calcium, Ion: 1.1 mmol/L — ABNORMAL LOW (ref 1.15–1.40)
HCT: 29 % — ABNORMAL LOW (ref 39.0–52.0)
Hemoglobin: 9.9 g/dL — ABNORMAL LOW (ref 13.0–17.0)
O2 Saturation: 78 %
Potassium: 4 mmol/L (ref 3.5–5.1)
Sodium: 142 mmol/L (ref 135–145)
TCO2: 29 mmol/L (ref 22–32)
pCO2, Ven: 51.5 mmHg (ref 44.0–60.0)
pH, Ven: 7.332 (ref 7.250–7.430)
pO2, Ven: 46 mmHg — ABNORMAL HIGH (ref 32.0–45.0)

## 2020-04-11 LAB — GLUCOSE, CAPILLARY
Glucose-Capillary: 122 mg/dL — ABNORMAL HIGH (ref 70–99)
Glucose-Capillary: 127 mg/dL — ABNORMAL HIGH (ref 70–99)
Glucose-Capillary: 107 mg/dL — ABNORMAL HIGH (ref 70–99)
Glucose-Capillary: 129 mg/dL — ABNORMAL HIGH (ref 70–99)
Glucose-Capillary: 142 mg/dL — ABNORMAL HIGH (ref 70–99)

## 2020-04-11 LAB — BASIC METABOLIC PANEL
Anion gap: 7 (ref 5–15)
BUN: 10 mg/dL (ref 8–23)
CO2: 23 mmol/L (ref 22–32)
Calcium: 8 mg/dL — ABNORMAL LOW (ref 8.9–10.3)
Chloride: 106 mmol/L (ref 98–111)
Creatinine, Ser: 0.6 mg/dL — ABNORMAL LOW (ref 0.61–1.24)
GFR, Estimated: >60 mL/min (ref >60)
Glucose, Bld: 125 mg/dL — ABNORMAL HIGH (ref 70–99)
Potassium: 4.3 mmol/L (ref 3.5–5.1)
Sodium: 136 mmol/L (ref 135–145)

## 2020-04-11 LAB — ECHO INTRAOPERATIVE TEE
Height: 69 in
Weight: 2640 oz

## 2020-04-11 LAB — PROTIME-INR
INR: 1.4 — ABNORMAL HIGH (ref 0.8–1.2)
Prothrombin Time: 17 seconds — ABNORMAL HIGH (ref 11.4–15.2)

## 2020-04-11 LAB — HEMOGLOBIN AND HEMATOCRIT, BLOOD
HCT: 28.6 % — ABNORMAL LOW (ref 39.0–52.0)
Hemoglobin: 9.1 g/dL — ABNORMAL LOW (ref 13.0–17.0)

## 2020-04-11 LAB — APTT: aPTT: 38 seconds — ABNORMAL HIGH (ref 24–36)

## 2020-04-11 LAB — PLATELET COUNT
Platelets: UNDETERMINED 10*3/uL (ref 150–400)
Platelets: 70 10*3/uL — ABNORMAL LOW (ref 150–400)

## 2020-04-11 SURGERY — REPAIR, MITRAL VALVE, MINIMALLY INVASIVE
Anesthesia: General | Site: Chest | Laterality: Right

## 2020-04-11 MED ORDER — LACTATED RINGERS IV SOLN: INTRAVENOUS | Status: DC | PRN

## 2020-04-11 MED ORDER — 0.9 % SODIUM CHLORIDE (POUR BTL) OPTIME
TOPICAL | Status: DC | PRN
  Administered 2020-04-11: 4000 mL

## 2020-04-11 MED ORDER — FENTANYL CITRATE (PF) 250 MCG/5ML IJ SOLN
INTRAMUSCULAR | Status: AC
  Filled 2020-04-11: qty 10

## 2020-04-11 MED ORDER — BUPIVACAINE LIPOSOME 1.3 % IJ SUSP
20.0000 mL | INTRAMUSCULAR | Status: AC
  Administered 2020-04-11: 20 mL
  Filled 2020-04-11: qty 20

## 2020-04-11 MED ORDER — ALBUMIN HUMAN 5 % IV SOLN
250.0000 mL | INTRAVENOUS | Status: AC | PRN
  Filled 2020-04-11: qty 250

## 2020-04-11 MED ORDER — SODIUM CHLORIDE 0.9% FLUSH
3.0000 mL | INTRAVENOUS | Status: DC | PRN
  Administered 2020-04-14: 3 mL via INTRAVENOUS

## 2020-04-11 MED ORDER — BISACODYL 5 MG PO TBEC
10.0000 mg | DELAYED_RELEASE_TABLET | Freq: Every day | ORAL | Status: DC
  Administered 2020-04-12 – 2020-04-15 (×3): 10 mg via ORAL
  Filled 2020-04-11 (×4): qty 2

## 2020-04-11 MED ORDER — CALCIUM CHLORIDE 10 % IV SOLN
INTRAVENOUS | Status: AC
  Filled 2020-04-11: qty 10

## 2020-04-11 MED ORDER — SODIUM CHLORIDE 0.9 % IV SOLN
1.5000 g | Freq: Two times a day (BID) | INTRAVENOUS | Status: AC
  Administered 2020-04-11 – 2020-04-13 (×4): 1.5 g via INTRAVENOUS
  Filled 2020-04-11 (×4): qty 1.5

## 2020-04-11 MED ORDER — ALBUMIN HUMAN 5 % IV SOLN: INTRAVENOUS | Status: DC | PRN

## 2020-04-11 MED ORDER — LIDOCAINE 2% (20 MG/ML) 5 ML SYRINGE
INTRAMUSCULAR | Status: AC
  Filled 2020-04-11: qty 5

## 2020-04-11 MED ORDER — PROTAMINE SULFATE 10 MG/ML IV SOLN
INTRAVENOUS | Status: AC
  Filled 2020-04-11: qty 5

## 2020-04-11 MED ORDER — ACETAMINOPHEN 160 MG/5ML PO SOLN: 1000.0000 mg | Freq: Four times a day (QID) | ORAL | Status: DC

## 2020-04-11 MED ORDER — ASPIRIN 81 MG PO CHEW: 324.0000 mg | CHEWABLE_TABLET | Freq: Every day | ORAL | Status: DC

## 2020-04-11 MED ORDER — SODIUM CHLORIDE 0.9 % IV SOLN: 250.0000 mL | INTRAVENOUS | Status: DC

## 2020-04-11 MED ORDER — CHLORHEXIDINE GLUCONATE CLOTH 2 % EX PADS
6.0000 | MEDICATED_PAD | Freq: Every day | CUTANEOUS | Status: DC
  Administered 2020-04-11 – 2020-04-14 (×4): 6 via TOPICAL

## 2020-04-11 MED ORDER — LACTATED RINGERS IV SOLN: INTRAVENOUS | Status: DC

## 2020-04-11 MED ORDER — ACETAMINOPHEN 500 MG PO TABS
1000.0000 mg | ORAL_TABLET | Freq: Four times a day (QID) | ORAL | Status: DC
  Administered 2020-04-11 – 2020-04-15 (×14): 1000 mg via ORAL
  Filled 2020-04-11 (×13): qty 2

## 2020-04-11 MED ORDER — ROCURONIUM BROMIDE 10 MG/ML (PF) SYRINGE
PREFILLED_SYRINGE | INTRAVENOUS | Status: AC
  Filled 2020-04-11: qty 10

## 2020-04-11 MED ORDER — VANCOMYCIN HCL 1000 MG IV SOLR
INTRAVENOUS | Status: DC | PRN
  Administered 2020-04-11: 1000 mL

## 2020-04-11 MED ORDER — METOPROLOL TARTRATE 5 MG/5ML IV SOLN
2.5000 mg | INTRAVENOUS | Status: DC | PRN
  Administered 2020-04-14: 2.5 mg via INTRAVENOUS
  Filled 2020-04-11: qty 5

## 2020-04-11 MED ORDER — MAGNESIUM SULFATE 4 GM/100ML IV SOLN
4.0000 g | Freq: Once | INTRAVENOUS | Status: AC
  Administered 2020-04-11: 4 g via INTRAVENOUS
  Filled 2020-04-11: qty 100

## 2020-04-11 MED ORDER — PROTAMINE SULFATE 10 MG/ML IV SOLN
INTRAVENOUS | Status: AC
  Filled 2020-04-11: qty 25

## 2020-04-11 MED ORDER — INSULIN REGULAR(HUMAN) IN NACL 100-0.9 UT/100ML-% IV SOLN: INTRAVENOUS | Status: DC

## 2020-04-11 MED ORDER — PHENYLEPHRINE 40 MCG/ML (10ML) SYRINGE FOR IV PUSH (FOR BLOOD PRESSURE SUPPORT)
PREFILLED_SYRINGE | INTRAVENOUS | Status: DC | PRN
  Administered 2020-04-11: 160 ug via INTRAVENOUS

## 2020-04-11 MED ORDER — CHLORHEXIDINE GLUCONATE 4 % EX LIQD: 30.0000 mL | CUTANEOUS | Status: DC

## 2020-04-11 MED ORDER — CALCIUM CHLORIDE 10 % IV SOLN
INTRAVENOUS | Status: DC | PRN
  Administered 2020-04-11: 500 mg via INTRAVENOUS

## 2020-04-11 MED ORDER — SODIUM CHLORIDE 0.9 % IV SOLN
INTRAVENOUS | Status: DC
  Administered 2020-04-11: 10 mL/h via INTRAVENOUS

## 2020-04-11 MED ORDER — PROPOFOL 10 MG/ML IV BOLUS
INTRAVENOUS | Status: AC
  Filled 2020-04-11: qty 20

## 2020-04-11 MED ORDER — CHLORHEXIDINE GLUCONATE 0.12 % MT SOLN
15.0000 mL | Freq: Once | OROMUCOSAL | Status: AC
  Administered 2020-04-11: 15 mL via OROMUCOSAL
  Filled 2020-04-11: qty 15

## 2020-04-11 MED ORDER — SODIUM CHLORIDE 0.9% FLUSH
3.0000 mL | Freq: Two times a day (BID) | INTRAVENOUS | Status: DC
  Administered 2020-04-12 – 2020-04-15 (×6): 3 mL via INTRAVENOUS

## 2020-04-11 MED ORDER — SUGAMMADEX SODIUM 200 MG/2ML IV SOLN
INTRAVENOUS | Status: DC | PRN
  Administered 2020-04-11: 200 mg via INTRAVENOUS
  Administered 2020-04-11: 150 mg via INTRAVENOUS

## 2020-04-11 MED ORDER — MIDAZOLAM HCL 2 MG/2ML IJ SOLN: 2.0000 mg | INTRAMUSCULAR | Status: DC | PRN

## 2020-04-11 MED ORDER — CHLORHEXIDINE GLUCONATE 0.12 % MT SOLN: 15.0000 mL | OROMUCOSAL | Status: AC

## 2020-04-11 MED ORDER — BISACODYL 10 MG RE SUPP: 10.0000 mg | Freq: Every day | RECTAL | Status: DC

## 2020-04-11 MED ORDER — SODIUM CHLORIDE 0.9 % IV SOLN: INTRAVENOUS | Status: AC

## 2020-04-11 MED ORDER — INSULIN ASPART 100 UNIT/ML ~~LOC~~ SOLN
0.0000 [IU] | SUBCUTANEOUS | Status: DC
  Administered 2020-04-11 (×2): 2 [IU] via SUBCUTANEOUS

## 2020-04-11 MED ORDER — PLASMA-LYTE 148 IV SOLN
INTRAVENOUS | Status: DC | PRN
  Administered 2020-04-11: 500 mL via INTRAVASCULAR

## 2020-04-11 MED ORDER — BUPIVACAINE HCL (PF) 0.5 % IJ SOLN
INTRAMUSCULAR | Status: AC
  Filled 2020-04-11: qty 30

## 2020-04-11 MED ORDER — METOPROLOL TARTRATE 12.5 MG HALF TABLET
12.5000 mg | ORAL_TABLET | Freq: Once | ORAL | Status: AC
  Administered 2020-04-11: 12.5 mg via ORAL
  Filled 2020-04-11: qty 1

## 2020-04-11 MED ORDER — PANTOPRAZOLE SODIUM 40 MG PO TBEC: 40.0000 mg | DELAYED_RELEASE_TABLET | Freq: Every day | ORAL | Status: DC

## 2020-04-11 MED ORDER — DEXMEDETOMIDINE HCL IN NACL 400 MCG/100ML IV SOLN
0.0000 ug/kg/h | INTRAVENOUS | Status: DC
  Filled 2020-04-11: qty 100

## 2020-04-11 MED ORDER — ACETAMINOPHEN 650 MG RE SUPP: 650.0000 mg | Freq: Once | RECTAL | Status: DC

## 2020-04-11 MED ORDER — DEXTROSE 50 % IV SOLN: 0.0000 mL | INTRAVENOUS | Status: DC | PRN

## 2020-04-11 MED ORDER — PHENYLEPHRINE HCL-NACL 20-0.9 MG/250ML-% IV SOLN: 0.0000 ug/min | INTRAVENOUS | Status: DC

## 2020-04-11 MED ORDER — LACTATED RINGERS IV SOLN: 500.0000 mL | Freq: Once | INTRAVENOUS | Status: DC | PRN

## 2020-04-11 MED ORDER — DEXAMETHASONE SODIUM PHOSPHATE 10 MG/ML IJ SOLN
INTRAMUSCULAR | Status: DC | PRN
  Administered 2020-04-11: 8 mg via INTRAVENOUS

## 2020-04-11 MED ORDER — ASPIRIN EC 325 MG PO TBEC: 325.0000 mg | DELAYED_RELEASE_TABLET | Freq: Every day | ORAL | Status: DC

## 2020-04-11 MED ORDER — POTASSIUM CHLORIDE 10 MEQ/50ML IV SOLN
10.0000 meq | INTRAVENOUS | Status: AC
  Filled 2020-04-11: qty 50

## 2020-04-11 MED ORDER — VANCOMYCIN HCL IN DEXTROSE 1-5 GM/200ML-% IV SOLN
1000.0000 mg | Freq: Once | INTRAVENOUS | Status: AC
  Administered 2020-04-11: 1000 mg via INTRAVENOUS
  Filled 2020-04-11 (×3): qty 200

## 2020-04-11 MED ORDER — ONDANSETRON HCL 4 MG/2ML IJ SOLN
INTRAMUSCULAR | Status: DC | PRN
  Administered 2020-04-11: 4 mg via INTRAVENOUS

## 2020-04-11 MED ORDER — ONDANSETRON HCL 4 MG/2ML IJ SOLN
INTRAMUSCULAR | Status: AC
  Filled 2020-04-11: qty 2

## 2020-04-11 MED ORDER — FAMOTIDINE IN NACL 20-0.9 MG/50ML-% IV SOLN
20.0000 mg | Freq: Two times a day (BID) | INTRAVENOUS | Status: DC
  Filled 2020-04-11 (×2): qty 50

## 2020-04-11 MED ORDER — PROPOFOL 10 MG/ML IV BOLUS
INTRAVENOUS | Status: DC | PRN
  Administered 2020-04-11: 90 mg via INTRAVENOUS
  Administered 2020-04-11: 10 mg via INTRAVENOUS

## 2020-04-11 MED ORDER — MORPHINE SULFATE (PF) 2 MG/ML IV SOLN
1.0000 mg | INTRAVENOUS | Status: DC | PRN
  Administered 2020-04-14 (×2): 2 mg via INTRAVENOUS
  Filled 2020-04-11 (×2): qty 1

## 2020-04-11 MED ORDER — NITROGLYCERIN IN D5W 200-5 MCG/ML-% IV SOLN: 0.0000 ug/min | INTRAVENOUS | Status: DC

## 2020-04-11 MED ORDER — SODIUM CHLORIDE 0.45 % IV SOLN: INTRAVENOUS | Status: DC | PRN

## 2020-04-11 MED ORDER — FENTANYL CITRATE (PF) 250 MCG/5ML IJ SOLN
INTRAMUSCULAR | Status: DC | PRN
  Administered 2020-04-11: 50 ug via INTRAVENOUS
  Administered 2020-04-11: 100 ug via INTRAVENOUS

## 2020-04-11 MED ORDER — OXYCODONE HCL 5 MG PO TABS: 5.0000 mg | ORAL_TABLET | ORAL | Status: DC | PRN

## 2020-04-11 MED ORDER — HEPARIN SODIUM (PORCINE) 1000 UNIT/ML IJ SOLN
INTRAMUSCULAR | Status: AC
  Filled 2020-04-11: qty 1

## 2020-04-11 MED ORDER — MIDAZOLAM HCL (PF) 10 MG/2ML IJ SOLN
INTRAMUSCULAR | Status: AC
  Filled 2020-04-11: qty 2

## 2020-04-11 MED ORDER — ACETAMINOPHEN 160 MG/5ML PO SOLN: 650.0000 mg | Freq: Once | ORAL | Status: DC

## 2020-04-11 MED ORDER — ORAL CARE MOUTH RINSE
15.0000 mL | Freq: Two times a day (BID) | OROMUCOSAL | Status: DC
  Administered 2020-04-11 – 2020-04-15 (×6): 15 mL via OROMUCOSAL

## 2020-04-11 MED ORDER — TRAMADOL HCL 50 MG PO TABS
50.0000 mg | ORAL_TABLET | ORAL | Status: DC | PRN
  Administered 2020-04-11 – 2020-04-15 (×5): 100 mg via ORAL
  Filled 2020-04-11 (×5): qty 2

## 2020-04-11 MED ORDER — ONDANSETRON HCL 4 MG/2ML IJ SOLN
4.0000 mg | Freq: Four times a day (QID) | INTRAMUSCULAR | Status: DC | PRN
  Filled 2020-04-11: qty 2

## 2020-04-11 MED ORDER — PHENYLEPHRINE 40 MCG/ML (10ML) SYRINGE FOR IV PUSH (FOR BLOOD PRESSURE SUPPORT)
PREFILLED_SYRINGE | INTRAVENOUS | Status: AC
  Filled 2020-04-11: qty 10

## 2020-04-11 MED ORDER — DOCUSATE SODIUM 100 MG PO CAPS
200.0000 mg | ORAL_CAPSULE | Freq: Every day | ORAL | Status: DC
  Administered 2020-04-12 – 2020-04-15 (×3): 200 mg via ORAL
  Filled 2020-04-11 (×4): qty 2

## 2020-04-11 MED ORDER — HEPARIN SODIUM (PORCINE) 1000 UNIT/ML IJ SOLN
INTRAMUSCULAR | Status: DC | PRN
  Administered 2020-04-11: 27000 [IU] via INTRAVENOUS

## 2020-04-11 MED ORDER — MIDAZOLAM HCL 5 MG/5ML IJ SOLN
INTRAMUSCULAR | Status: DC | PRN
  Administered 2020-04-11 (×3): 1 mg via INTRAVENOUS

## 2020-04-11 MED ORDER — ROCURONIUM BROMIDE 10 MG/ML (PF) SYRINGE
PREFILLED_SYRINGE | INTRAVENOUS | Status: DC | PRN
  Administered 2020-04-11: 40 mg via INTRAVENOUS
  Administered 2020-04-11: 60 mg via INTRAVENOUS
  Administered 2020-04-11: 40 mg via INTRAVENOUS

## 2020-04-11 MED ORDER — PROTAMINE SULFATE 10 MG/ML IV SOLN
INTRAVENOUS | Status: DC | PRN
  Administered 2020-04-11: 10 mg via INTRAVENOUS
  Administered 2020-04-11: 260 mg via INTRAVENOUS

## 2020-04-11 SURGICAL SUPPLY — 110 items
ADAPTER CARDIO PERF ANTE/RETRO (ADAPTER) ×3 IMPLANT
BAG DECANTER FOR FLEXI CONT (MISCELLANEOUS) ×6 IMPLANT
BLADE CLIPPER SURG (BLADE) ×3 IMPLANT
BLADE SURG 11 STRL SS (BLADE) ×3 IMPLANT
CANISTER SUCT 3000ML PPV (MISCELLANEOUS) ×6 IMPLANT
CANNULA ADULT BIO-MEDICUS 15FR (CANNULA) ×3 IMPLANT
CANNULA FEM VENOUS REMOTE 22FR (CANNULA) ×3 IMPLANT
CANNULA FEMORAL ART 14 SM (MISCELLANEOUS) IMPLANT
CANNULA GUNDRY RCSP 15FR (MISCELLANEOUS) ×3 IMPLANT
CANNULA OPTISITE PERFUSION 16F (CANNULA) IMPLANT
CANNULA OPTISITE PERFUSION 18F (CANNULA) ×3 IMPLANT
CANNULA SUMP PERICARDIAL (CANNULA) ×6 IMPLANT
CATH CPB KIT OWEN (MISCELLANEOUS) IMPLANT
CATH KIT ON-Q SILVERSOAK 5IN (CATHETERS) IMPLANT
CELLS DAT CNTRL 66122 CELL SVR (MISCELLANEOUS) ×2 IMPLANT
CLOSURE PERCLOSE PROSTYLE (VASCULAR PRODUCTS) ×12 IMPLANT
CNTNR URN SCR LID CUP LEK RST (MISCELLANEOUS) ×2 IMPLANT
CONN ST 1/4X3/8  BEN (MISCELLANEOUS) ×6
CONN ST 1/4X3/8 BEN (MISCELLANEOUS) ×4 IMPLANT
CONNECTOR 1/2X3/8X1/2 3 WAY (MISCELLANEOUS) ×3
CONNECTOR 1/2X3/8X1/2 3WAY (MISCELLANEOUS) ×2 IMPLANT
CONT SPEC 4OZ STRL OR WHT (MISCELLANEOUS) ×3
COVER BACK TABLE 24X17X13 BIG (DRAPES) ×3 IMPLANT
COVER PROBE W GEL 5X96 (DRAPES) ×3 IMPLANT
DERMABOND ADVANCED (GAUZE/BANDAGES/DRESSINGS) ×2
DERMABOND ADVANCED .7 DNX12 (GAUZE/BANDAGES/DRESSINGS) ×4 IMPLANT
DEVICE CLOSURE PERCLS PRGLD 6F (VASCULAR PRODUCTS) ×8 IMPLANT
DEVICE SUT CK QUICK LOAD INDV (Prosthesis & Implant Heart) ×9 IMPLANT
DEVICE SUT CK QUICK LOAD MINI (Prosthesis & Implant Heart) ×3 IMPLANT
DEVICE TROCAR PUNCTURE CLOSURE (ENDOMECHANICALS) ×3 IMPLANT
DRAIN CHANNEL 32F RND 10.7 FF (WOUND CARE) ×6 IMPLANT
DRAPE C-ARM 42X72 X-RAY (DRAPES) ×3 IMPLANT
DRAPE CV SPLIT W-CLR ANES SCRN (DRAPES) ×3 IMPLANT
DRAPE INCISE IOBAN 66X45 STRL (DRAPES) ×9 IMPLANT
DRAPE PERI GROIN 82X75IN TIB (DRAPES) ×3 IMPLANT
DRAPE SLUSH/WARMER DISC (DRAPES) ×3 IMPLANT
DRSG AQUACEL AG ADV 3.5X10 (GAUZE/BANDAGES/DRESSINGS) ×3 IMPLANT
ELECT BLADE 6.5 EXT (BLADE) ×3 IMPLANT
ELECT REM PT RETURN 9FT ADLT (ELECTROSURGICAL) ×6
ELECTRODE REM PT RTRN 9FT ADLT (ELECTROSURGICAL) ×4 IMPLANT
FELT TEFLON 1X6 (MISCELLANEOUS) ×3 IMPLANT
FEMORAL VENOUS CANN RAP (CANNULA) IMPLANT
GAUZE SPONGE 4X4 12PLY STRL (GAUZE/BANDAGES/DRESSINGS) ×3 IMPLANT
GAUZE SPONGE 4X4 12PLY STRL LF (GAUZE/BANDAGES/DRESSINGS) ×3 IMPLANT
GLOVE BIO SURGEON STRL SZ7.5 (GLOVE) ×3 IMPLANT
GLOVE ORTHO TXT STRL SZ7.5 (GLOVE) ×9 IMPLANT
GLOVE SURG SS PI 6.5 STRL IVOR (GLOVE) ×6 IMPLANT
GLOVE SURG SS PI 7.5 STRL IVOR (GLOVE) ×3 IMPLANT
GOWN STRL REUS W/ TWL LRG LVL3 (GOWN DISPOSABLE) ×16 IMPLANT
GOWN STRL REUS W/TWL LRG LVL3 (GOWN DISPOSABLE) ×24
GRASPER SUT TROCAR 14GX15 (MISCELLANEOUS) ×3 IMPLANT
IV NS 1000ML (IV SOLUTION) ×3
IV NS 1000ML BAXH (IV SOLUTION) ×2 IMPLANT
IV NS IRRIG 3000ML ARTHROMATIC (IV SOLUTION) ×3 IMPLANT
KIT BASIN OR (CUSTOM PROCEDURE TRAY) ×3 IMPLANT
KIT DILATOR VASC 18G NDL (KITS) ×3 IMPLANT
KIT DRAINAGE VACCUM ASSIST (KITS) ×3 IMPLANT
KIT SUCTION CATH 14FR (SUCTIONS) ×3 IMPLANT
KIT SUT CK MINI COMBO 4X17 (Prosthesis & Implant Heart) ×3 IMPLANT
KIT TURNOVER KIT B (KITS) ×3 IMPLANT
LEAD PACING MYOCARDI (MISCELLANEOUS) ×3 IMPLANT
LINE VENT (MISCELLANEOUS) ×3 IMPLANT
NEEDLE AORTIC ROOT 14G 7F (CATHETERS) ×3 IMPLANT
NS IRRIG 1000ML POUR BTL (IV SOLUTION) ×15 IMPLANT
PACK E MIN INVASIVE VALVE (SUTURE) ×3 IMPLANT
PACK OPEN HEART (CUSTOM PROCEDURE TRAY) ×3 IMPLANT
PAD ARMBOARD 7.5X6 YLW CONV (MISCELLANEOUS) ×6 IMPLANT
PAD ELECT DEFIB RADIOL ZOLL (MISCELLANEOUS) ×3 IMPLANT
PERCLOSE PROGLIDE 6F (VASCULAR PRODUCTS) ×12
POSITIONER HEAD DONUT 9IN (MISCELLANEOUS) ×3 IMPLANT
RING MITRAL MEMO 4D 30 (Prosthesis & Implant Heart) ×3 IMPLANT
RTRCTR WOUND ALEXIS 18CM MED (MISCELLANEOUS) ×3
SET CANNULATION TOURNIQUET (MISCELLANEOUS) ×3 IMPLANT
SET CARDIOPLEGIA MPS 5001102 (MISCELLANEOUS) ×3 IMPLANT
SET IRRIG TUBING LAPAROSCOPIC (IRRIGATION / IRRIGATOR) ×3 IMPLANT
SET MICROPUNCTURE 5F STIFF (MISCELLANEOUS) ×6 IMPLANT
SHEATH PINNACLE 8F 10CM (SHEATH) ×18 IMPLANT
SIZER CHORD-X CHORDAL CXCS (SIZER) ×3 IMPLANT
SOL ANTI FOG 6CC (MISCELLANEOUS) ×2 IMPLANT
SOLUTION ANTI FOG 6CC (MISCELLANEOUS) ×1
SUT BONE WAX W31G (SUTURE) ×3 IMPLANT
SUT EB EXC GRN/WHT 2-0 D/A SH (SUTURE) ×3
SUT ETHIBOND (SUTURE) ×6 IMPLANT
SUT ETHIBOND 2-0 RB-1 WHT (SUTURE) ×6 IMPLANT
SUT ETHIBOND X763 2 0 SH 1 (SUTURE) ×3 IMPLANT
SUT GORETEX CV 4 TH 22 36 (SUTURE) IMPLANT
SUT GORETEX CV-5THC-13 36IN (SUTURE) ×15 IMPLANT
SUT GORETEX CV4 TH-18 (SUTURE) IMPLANT
SUT PROLENE 3 0 SH DA (SUTURE) ×6 IMPLANT
SUT PROLENE 3 0 SH1 36 (SUTURE) ×15 IMPLANT
SUT PROLENE 4 0 RB 1 (SUTURE) ×3
SUT PROLENE 4-0 RB1 .5 CRCL 36 (SUTURE) ×2 IMPLANT
SUT PROLENE 6 0 C 1 30 (SUTURE) ×15 IMPLANT
SUT PTFE CHORD X 20MM (SUTURE) ×6 IMPLANT
SUT SILK  1 MH (SUTURE) ×3
SUT SILK 1 MH (SUTURE) ×2 IMPLANT
SUTURE EB EXC GRN/WHT 2-0 D/A (SUTURE) ×2 IMPLANT
SYSTEM SAHARA CHEST DRAIN ATS (WOUND CARE) ×3 IMPLANT
TAPE CLOTH SURG 4X10 WHT LF (GAUZE/BANDAGES/DRESSINGS) ×3 IMPLANT
TAPE PAPER 2X10 WHT MICROPORE (GAUZE/BANDAGES/DRESSINGS) ×3 IMPLANT
TOWEL GREEN STERILE (TOWEL DISPOSABLE) ×3 IMPLANT
TOWEL GREEN STERILE FF (TOWEL DISPOSABLE) ×3 IMPLANT
TRAY FOLEY SLVR 16FR TEMP STAT (SET/KITS/TRAYS/PACK) ×3 IMPLANT
TROCAR XCEL BLADELESS 5X75MML (TROCAR) ×3 IMPLANT
TROCAR XCEL NON-BLD 11X100MML (ENDOMECHANICALS) ×6 IMPLANT
TUBE SUCT INTRACARD DLP 20F (MISCELLANEOUS) ×3 IMPLANT
TUNNELER SHEATH ON-Q 11GX8 DSP (PAIN MANAGEMENT) IMPLANT
UNDERPAD 30X36 HEAVY ABSORB (UNDERPADS AND DIAPERS) ×3 IMPLANT
WATER STERILE IRR 1000ML POUR (IV SOLUTION) ×6 IMPLANT
WIRE EMERALD 3MM-J .035X150CM (WIRE) ×3 IMPLANT

## 2020-04-11 NOTE — Anesthesia Procedure Notes (Signed)
Arterial Line Insertion Start/End2/10/2020 6:30 AM, 04/11/2020 6:35 AM Performed by: Verdie Drown, CRNA, CRNA  Patient location: Pre-op. Preanesthetic checklist: patient identified, IV checked, site marked, risks and benefits discussed, surgical consent, monitors and equipment checked, pre-op evaluation, timeout performed and anesthesia consent Lidocaine 1% used for infiltration Left, radial was placed Catheter size: 20 G Hand hygiene performed  and maximum sterile barriers used   Attempts: 1 Procedure performed without using ultrasound guided technique. Following insertion, dressing applied and Biopatch. Post procedure assessment: normal  Patient tolerated the procedure well with no immediate complications.

## 2020-04-11 NOTE — Interval H&P Note (Signed)
History and Physical Interval Note:  04/11/2020 6:29 AM  Tyler [name]  has presented today for surgery, with the diagnosis of MR.  The various methods of treatment have been discussed with the patient and family. After consideration of risks, benefits and other options for treatment, the patient has consented to  Procedure(s): MINIMALLY INVASIVE MITRAL VALVE REPAIR (MVR) (Right) TRANSESOPHAGEAL ECHOCARDIOGRAM (TEE) (N/A) as a surgical intervention.  The patient's history has been reviewed, patient examined, no change in status, stable for surgery.  I have reviewed the patient's chart and labs.  Questions were answered to the patient's satisfaction.     Rexene Alberts

## 2020-04-11 NOTE — Progress Notes (Signed)
  Echocardiogram Echocardiogram Transesophageal has been performed.  Tyler [name] 04/11/2020, 9:01 AM

## 2020-04-11 NOTE — Hospital Course (Signed)
Referring Provider is O'Neal, Cassie Freer, MD PCP is Prince Solian, MD  History of Present Illness (Initial consultation 12/28/19) Patient is a 70 year old gentleman with history of hypertension and depression who has been referred for surgical consultation for newly discovered mitral valve prolapse with severe mitral regurgitation.   Patient states that he has been relatively healthy all of his adult life.  He takes multiple medications for hypertension and major depression.  He has never had any significant cardiac history.  He was recently noted to have a prominent systolic murmur on routine physical exam by his primary care physician.  Subsequent transthoracic echocardiogram demonstrated mitral valve prolapse with severe mitral regurgitation.  He was referred for cardiology consultation and has been evaluated previously by Dr. Davina Poke.  Transesophageal echocardiogram and diagnostic cardiac catheterization were performed on December 22, 2019.  TEE confirmed the presence of myxomatous degenerative disease of the mitral valve with a large flail segment involving the A3 portion of the anterior leaflet with obvious ruptured primary chordae tendinae.  There was severe mitral regurgitation with flow reversal noted in all 4 pulmonary veins.  PISA radius measured 1.4 cm corresponding to ERO 1.02 cm and regurgitant volume of 154 mL.  Left ventricular size and systolic function was normal.  There was moderate left atrial enlargement.  No other significant abnormalities were noted.  Diagnostic cardiac catheterization revealed minimal nonobstructive coronary artery disease.  Right heart pressures were not elevated but there were large V waves on wedge tracing consistent with severe mitral regurgitation.  Elective surgical consultation was requested.   Patient is married and lives locally with his wife.  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  He does not exercise  on a regular basis but he reports being functionally independent.  He drives an automobile, does chores around the house, and reports no significant physical limitations.  He specifically denies any symptoms of exertional shortness of breath or chest discomfort.  He has never had any resting shortness of breath, PND, orthopnea, or lower extremity edema.  He denies any history of palpitations.  He has occasional mild dizzy spells without any history of syncope.   (subsequent consultation 03/27/20) Patient returns to the office today for follow-up of recently discovered mitral valve prolapse with severe mitral regurgitation.  He was originally seen in consultation on December 28, 2019 and plans for elective mitral valve repair were initially made for November. However, the patient suffered a fall at home and then later developed minimally symptomatic COVID-19 infection in early December. He completed his quarantine and was finally seen by his dentist for clearance and found to have a problematic tooth with chronic infection that needed extraction.  Once he finally got his tooth extracted surgery was postponed yet again because of restrictions placed on elective surgeries because of the recent surgeon Covid infections.  The patient returns her office today with hopes to proceed with elective mitral valve repair on April 11, 2020.  He reports no new problems or complaints.  He continues to deny any symptoms of exertional shortness of breath, fatigue or chest discomfort.   Hospital Course:  Mr. Frimpong was admitted for elective surgery on 04/11/20 and taken to the OR where the mitral valve was repaired by placing several PTFE neo-chords, performing an annuloplasty with a 78mm MEMO 4D ring and cleft repair. Following the procedure, he separated brom cardiopulmonary bypass without difficulty, was extubated in the OR,  and was transferred to the cardiovascular ICU  in stable condition.  He remained hemodynamically  stable.  He resumed oral intake on the evening of surgery and was tolerating a regular diet by the morning of postop day 1.  Monitoring lines were removed.  He was mobilized on postop day 1.  Diuresis was initiated for expected volume excess.  His initial postoperative platelet count was 51,000 and had improved to 83,000 by the morning of postop day 1.  Given his history of thrombocytopenia, this was not unexpected and was monitored throughout the admission. The chest tubes were removed on postop day 2.  Anticoagulation was begun with low-dose Coumadin.  INR was monitored daily.  He was transferred to 4 E. progressive care.  He quickly regained independence with mobility and was easily weaned from supplemental oxygen.

## 2020-04-11 NOTE — Transfer of Care (Signed)
Immediate Anesthesia Transfer of Care Note  Patient: Tyler [name]  Procedure(s) Performed: MINIMALLY INVASIVE MITRAL VALVE REPAIR (MVR) USING MEMO 4D RING SIZE 30MM (Right Chest) TRANSESOPHAGEAL ECHOCARDIOGRAM (TEE) (N/A )  Patient Location: SICU  Anesthesia Type:General  Level of Consciousness: drowsy, patient cooperative and responds to stimulation  Airway & Oxygen Therapy: Patient Spontanous Breathing and Patient connected to face mask oxygen  Post-op Assessment: Report given to RN, Post -op Vital signs reviewed and stable, Patient moving all extremities X 4 and Patient able to stick tongue midline  Post vital signs: Reviewed and stable  Last Vitals:  Vitals Value Taken Time  BP 93/77 04/11/20 1332  Temp    Pulse 79 04/11/20 1337  Resp 14 04/11/20 1337  SpO2 99 % 04/11/20 1337  Vitals shown include unvalidated device data.  Last Pain:  Vitals:   04/11/20 0611  TempSrc:   PainSc: 0-No pain         Complications: No complications documented.

## 2020-04-11 NOTE — Progress Notes (Signed)
Patient ID: Tyler [name], male   DOB: 1950/09/08, 70 y.o.   MRN: [id number] EVENING ROUNDS NOTE :     [address]       [place] [address]             [phone]                 Day of Surgery Procedure(s) (LRB): MINIMALLY INVASIVE MITRAL VALVE REPAIR (MVR) USING MEMO 4D RING SIZE 30MM (Right) TRANSESOPHAGEAL ECHOCARDIOGRAM (TEE) (N/A)  Total Length of Stay:  LOS: 0 days  BP 103/80   Pulse 88   Temp (!) 97.16 F (36.2 C)   Resp (!) 21   Ht 5\' 9"  (1.753 m)   Wt 74.8 kg   SpO2 99%   BMI 24.37 kg/m   .Intake/Output      02/07 0701 02/08 0700 02/08 0701 02/09 0700   I.V. (mL/kg)  2443.2 (32.7)   Blood  400   IV Piggyback  1067   Total Intake(mL/kg)  3910.2 (52.3)   Urine (mL/kg/hr)  2070 (2.4)   Blood  673   Chest Tube  264   Total Output  3007   Net  +903.2          . sodium chloride    . sodium chloride 100 mL/hr at 04/11/20 1800  . [START ON 04/12/2020] sodium chloride    . sodium chloride 10 mL/hr (04/11/20 1330)  . albumin human    . cefUROXime (ZINACEF)  IV    . dexmedetomidine (PRECEDEX) IV infusion 0.1 mcg/kg/hr (04/11/20 1800)  . famotidine (PEPCID) IV Stopped (04/11/20 1425)  . lactated ringers    . lactated ringers    . lactated ringers 20 mL/hr at 04/11/20 1800  . magnesium sulfate 20 mL/hr at 04/11/20 1800  . nitroGLYCERIN 0 mcg/min (04/11/20 1330)  . phenylephrine (NEO-SYNEPHRINE) Adult infusion 0 mcg/min (04/11/20 1330)  . vancomycin       Lab Results  Component Value Date   WBC 9.7 04/11/2020   HGB 10.9 (L) 04/11/2020   HCT 34.7 (L) 04/11/2020   PLT PLATELET CLUMPS NOTED ON SMEAR, UNABLE TO ESTIMATE 04/11/2020   GLUCOSE 120 (H) 04/11/2020   ALT 17 04/07/2020   AST 20 04/07/2020   NA 142 04/11/2020   K 3.8 04/11/2020   CL 106 04/11/2020   CREATININE 0.50 (L) 04/11/2020   BUN 10 04/11/2020   CO2 22 04/07/2020   INR 1.4 (H) 04/11/2020   HGBA1C 5.4 04/07/2020   Stable post op today extubated  200 total from ct  tube Paced [place] MD  Beeper [phone] Office [phone] 04/11/2020 6:30 PM

## 2020-04-11 NOTE — Brief Op Note (Addendum)
04/11/2020  11:42 AM  PATIENT:  Tyler [name]  70 y.o. male  PRE-OPERATIVE DIAGNOSIS:  mitral regurgitation  POST-OPERATIVE DIAGNOSIS:  mitral regurgitation  PROCEDURE:   MINIMALLY INVASIVE MITRAL VALVE REPAIR with  -Placement of PTFE neo-chords  -Mitral valve annuloplasty using a 30MM MEMO 4D RING  -Closure of mitral cleft   TRANSESOPHAGEAL ECHOCARDIOGRAM   SURGEON:   Rexene Alberts, MD - Primary  PHYSICIAN ASSISTANT: Roddenberry  ASSISTANTS: Staff RNFA  ANESTHESIA:   general  EBL:  Per anesthesia and perfusion records   BLOOD ADMINISTERED:none  DRAINS: Left pleural and mediastinal drains   LOCAL MEDICATIONS USED:  NONE  SPECIMEN:  No Specimen  DISPOSITION OF SPECIMEN:  N/A  COUNTS:  YES  DICTATION: .Dragon Dictation  PLAN OF CARE: Admit to inpatient   PATIENT DISPOSITION:  ICU - intubated and hemodynamically stable.   Delay start of Pharmacological VTE agent (>24hrs) due to surgical blood loss or risk of bleeding: yes

## 2020-04-11 NOTE — Anesthesia Procedure Notes (Signed)
Central Venous Catheter Insertion Performed by: Roberts Gaudy, MD, anesthesiologist Start/End2/10/2020 6:45 AM, 04/11/2020 6:50 AM Patient location: Pre-op. Preanesthetic checklist: patient identified, IV checked, site marked, risks and benefits discussed, surgical consent, monitors and equipment checked, pre-op evaluation, timeout performed and anesthesia consent Hand hygiene performed  and maximum sterile barriers used  PA cath was placed.Swan type:thermodilution Procedure performed without using ultrasound guided technique. Attempts: 1 Following insertion, line sutured, dressing applied and Biopatch. Post procedure assessment: blood return through all ports, free fluid flow and no air  Patient tolerated the procedure well with no immediate complications.

## 2020-04-11 NOTE — Anesthesia Procedure Notes (Signed)
Central Venous Catheter Insertion Performed by: Roberts Gaudy, MD, anesthesiologist Start/End2/10/2020 6:40 AM, 04/11/2020 6:50 AM Patient location: Pre-op. Preanesthetic checklist: patient identified, IV checked, site marked, risks and benefits discussed, surgical consent, monitors and equipment checked, pre-op evaluation, timeout performed and anesthesia consent Lidocaine 1% used for infiltration and patient sedated Hand hygiene performed  and maximum sterile barriers used  Catheter size: 8.5 Fr Sheath introducer Procedure performed using ultrasound guided technique. Ultrasound Notes:anatomy identified, needle tip was noted to be adjacent to the nerve/plexus identified, no ultrasound evidence of intravascular and/or intraneural injection and image(s) printed for medical record Attempts: 1 Following insertion, line sutured and dressing applied. Post procedure assessment: blood return through all ports, free fluid flow and no air  Patient tolerated the procedure well with no immediate complications.

## 2020-04-11 NOTE — Op Note (Addendum)
CARDIOTHORACIC SURGERY OPERATIVE NOTE  Date of Procedure:  04/11/2020  Preoperative Diagnosis: Severe Mitral Regurgitation  Postoperative Diagnosis: Same  Procedure:    Minimally-Invasive Mitral Valve Repair  Complex valvuloplasty including artificial Gore-tex neochord placement x8  Suture plication of posteriorl commissure  Tyler [name] Ring Annuloplasty (size 60mm, catalog # [id number], serial # [id number])    Surgeon: Valentina Gu. Roxy Manns, MD  Assistant: Enid Cutter, PA-C  Anesthesia: Roberts Gaudy, MD  Operative Findings:  Fibroelastic deficiency type myxomatous degenerative disease  Multiple ruptured primary chordae tendinae with flail A3 segment of anterior leaflet  Type II dysfunction with severe mitral regurgitation  Normal left ventricular systolic function  No residual mitral regurgitation after successful valve repair                BRIEF CLINICAL NOTE AND INDICATIONS FOR SURGERY  Patient is a 70 year old gentleman with history of hypertension and depression who has been referred for surgical consultation for newly discovered mitral valve prolapse with severe mitral regurgitation.  Patient states that he has been relatively healthy all of his adult life. He takes multiple medications for hypertension and major depression. He has never had any significant cardiac history. He was recently noted to have a prominent systolic murmur on routine physical exam by his primary care physician. Subsequent transthoracic echocardiogram demonstrated mitral valve prolapse with severe mitral regurgitation. He was referred for cardiology consultation and has been evaluated previously by Dr. Davina Poke. Transesophageal echocardiogram and diagnostic cardiac catheterization were performed on December 22, 2019. TEE confirmed the presence of myxomatous degenerative disease of the mitral valve with a large flail segment involving the A3 portion of the anterior leaflet with obvious  ruptured primary chordae tendinae.There was severe mitral regurgitation with flow reversal noted in all 4 pulmonary veins. PISAradius measured 1.4 cm corresponding to ERO1.02 cm and regurgitant volume of 154 mL. Left ventricular size and systolic function was normal. There was moderate left atrial enlargement. No other significant abnormalities were noted. Diagnostic cardiac catheterization revealed minimal nonobstructive coronary artery disease. Right heart pressures were not elevated but there were large V waves on wedge tracing consistent with severe mitral regurgitation. Elective surgical consultation was requested.  The patient has been seen in consultation and counseled at length regarding the indications, risks and potential benefits of surgery.  All questions have been answered, and the patient provides full informed consent for the operation as described.    DETAILS OF THE OPERATIVE PROCEDURE  Preparation:  The patient is brought to the operating room on the above mentioned date and central monitoring was established by the anesthesia team including placement of Swan-Ganz catheter through the left internal jugular vein.  A radial arterial line is placed. The patient is placed in the supine position on the operating table.  Intravenous antibiotics are administered. General endotracheal anesthesia is induced uneventfully. The patient is initially intubated using a dual lumen endotracheal tube.  A Foley catheter is placed.  Baseline transesophageal echocardiogram was performed.  Findings were notable for myxomatous degenerative disease with severe mitral valve prolapse including a large flail segment which incorporated the A3 portion of the anterior leaflet as well as the entire posterior lateral commissure.  There were multiple ruptured primary chordae tendinae.  There was severe mitral regurgitation.  There was flow reversal in the pulmonary veins.  There was normal left ventricular  systolic function.  There was normal right ventricular size and function.  The tricuspid annulus was not dilated and there was trivial tricuspid regurgitation.  A  soft roll is placed behind the patient's left scapula and the neck gently extended and turned to the left.   The patient's right neck, chest, abdomen, both groins, and both lower extremities are prepared and draped in a sterile manner. A time out procedure is performed.   Percutaneous Vascular Access:  Percutaneous arterial and venous access were obtained on the right side.  Using ultrasound guidance the right common femoral vein was cannulated using the Seldinger technique a pair of Perclose vascular closure devises were placed at opposing 30 degree angles in the femoral vein, after which time an 8 French sheath inserted.  The right common femoral artery was cannulated using a micropuncture wire and sheath.  A pair of Perclose vascular closure devices were placed at opposing 30 degree angles in the femoral artery, and a 8 French sheath inserted.  The right internal jugular vein was cannulated  using ultrasound guidance and an 8 French sheath inserted.     Surgical Approach:  A right miniature anterolateral thoracotomy incision is performed. The incision is placed just lateral to and superior to the right nipple. The pectoralis major muscle is retracted medially and completely preserved. The right pleural space is entered through the 3rd intercostal space. A soft tissue retractor is placed.  Two 11 mm ports are placed through separate stab incisions inferiorly. The right pleural space is insufflated continuously with carbon dioxide gas through the posterior port during the remainder of the operation.  A pledgeted sutures placed through the dome of the right hemidiaphragm and retracted inferiorly to facilitate exposure.  A longitudinal incision is made in the pericardium 3 cm anterior to the phrenic nerve and silk traction sutures are placed on  either side of the incision for exposure.   Extracorporeal Cardiopulmonary Bypass and Myocardial Protection:   The patient was heparinized systemically.  The right common femoral vein is cannulated through the venous sheath and a guidewire advanced into the right atrium using TEE guidance.  The femoral vein cannulated using a 22 Fr long femoral venous cannula.  The right common femoral artery is cannulated through the arterial sheath and a guidewire advanced into the descending thoracic aorta using TEE guidance.  Femoral artery is cannulated with a 18 French femoral arterial cannula.  The right internal jugular vein is cannulated through the venous sheath and a guidewire advanced into the right atrium.  The internal jugular vein is cannulated using a 15 [place] pediatric femoral venous cannula.   Adequate heparinization is verified.   The entire pre-bypass portion of the operation was notable for stable hemodynamics.  Cardiopulmonary bypass was begun.  Vacuum assist venous drainage is utilized. The incision in the pericardium is extended in both directions. Venous drainage and exposure are notably excellent. A retrograde cardioplegia cannula is placed through the right atrium into the coronary sinus using transesophageal echocardiogram guidance.  An antegrade cardioplegia cannula is placed in the ascending aorta.    The patient is cooled to 32C systemic temperature.  The aortic cross clamp is applied and cardioplegia is delivered initially in an antegrade fashion through the aortic root using modified [place] cold blood cardioplegia ([place] blood cardioplegia protocol).   The initial cardioplegic arrest is rapid with early diastolic arrest. Myocardial protection was felt to be excellent.   Mitral Valve Repair:  A left atriotomy incision was performed through the interatrial groove and extended partially across the back wall of the left atrium after opening the oblique sinus inferiorly.  The  mitral valve is exposed  using a self-retaining retractor.  The mitral valve was inspected and notable for fibroelastic deficiency type myxomatous degenerative disease.  There were multiple ruptured primary chordae tendon any from the A3 segment of the anterior leaflet and the entire posterior lateral commissure.  The leaflet tissue was relatively thin and delicate and the remainder of the valve appeared essentially normal.  There was no calcification.  Artificial neochord placement was performed using Chord-X multi-strand CV-4 Goretex pre-measured loops.  The appropriate cord length (37mm) was measured from corresponding normal length primary cords from the A1 and A2 segments of the anterior leaflet. The papillary muscle suture of a Chord-X multi-strand suture was placed through the head of the posterior papillary muscle in a horizontal mattress fashion and tied over Teflon felt pledgets. Each of the three pre-measured loops were then reimplanted into the free margin of the A3 segment of the anterior leaflet.  One of the Gore-Tex pairs pulled through the leaflet tissue and was discarded.  The papillary muscle suture of a second Chord-X multi-strand suture was placed through the head of the posterior papillary muscle in a horizontal mattress fashion and tied over Teflon felt pledgets. Two of the three pre-measured loops were then reimplanted into the free margin of the A3 segment of the anterior leaflet.  The last pair was implanted into the commissural leaflet itself.  Interrupted 2-0 Ethibond horizontal mattress sutures are placed circumferentially around the entire mitral valve annulus. The sutures will ultimately be utilized for ring annuloplasty, and at this juncture there are utilized to suspend the valve symmetrically.  The posterior lateral commissure was plicated using a total of 4 interrupted everting CV 5 Gore-Tex sutures.  The valve was tested with saline and appeared competent even without ring  annuloplasty complete. The valve was sized to a 30 mm annuloplasty ring, based upon the transverse distance between the left and right commissures and the height of the anterior leaflet, corresponding to a size just slightly larger than the overall surface area of the anterior leaflet.  A Tyler [name] annuloplasty ring (size 79mm, catalog #[id number], serial [id number]) was secured in place uneventfully. All ring sutures were secured using a Cor-knot device.    The valve was tested with saline and appeared competent. There is no residual leak. There was a broad, symmetrical line of coaptation of the anterior and posterior leaflet which was confirmed using the blue ink test.  Rewarming is begun.   Procedure Completion:  The atriotomy was closed using a 2-layer closure of running 3-0 Prolene suture after placing a sump drain across the mitral valve to serve as a left ventricular vent.  One final dose of warm retrograde "reanimation dose" cardioplegia was administered retrograde through the coronary sinus catheter while all air was evacuated through the aortic root.  The aortic cross clamp was removed after a total cross clamp time of 97 minutes.  Epicardial pacing wires are fixed to the inferior wall of the right ventricule and to the right atrial appendage. The patient is rewarmed to 37C temperature. The left ventricular vent and antegrade cardioplegia cannula are removed.  The pericardial sac was drained using a 32 French Bard drain placed through the anterior port incision. The patient is weaned and disconnected from cardiopulmonary bypass.  The patient's rhythm at separation from bypass was atrial paced.  The patient was weaned from bypass without any inotropic support. Total cardiopulmonary bypass time for the operation was 136 minutes.  Followup transesophageal echocardiogram performed after separation from bypass revealed a  well-seated annuloplasty ring in the mitral position with a normal functioning  mitral valve. There was no residual leak.  Left ventricular function was unchanged from preoperatively.  The mean gradient across the mitral valve was estimated to be 2 mmHg.  The femoral arterial and venous cannulas were removed and all Perclose sutures secured.  Manual pressure was maintained while Protamine was administered.  The right internal jugular cannula was removed and manual pressure held on the neck and groin for 15 minutes.  Single lung ventilation was begun. The atriotomy closure was inspected for hemostasis.  The right pleural space is irrigated with saline solution and inspected for hemostasis.   A mixture of Exparel liposomal bupivacaine (20 mL) and 0.5% bupivacaine (30 mL) is utilized to create an intercostal nerve block for postoperative analgesia.  The mixture is injected under direct vision into the intercostal neurovascular bundles posteriorly to cover the second through the sixth intercostal nerve roots.  Portions of the solution are also injected into the intercostal neurovascular bundles immediately surrounding the surgical incision and immediately adjacent to the chest tube exit sites.  The right pleural space was drained using a 32 French Bard drain placed through the posterior port incision. The miniature thoracotomy incision was closed in multiple layers in routine fashion.   The post-bypass portion of the operation was notable for stable rhythm and hemodynamics.  No blood products were administered during the operation.   Disposition:  The patient tolerated the procedure well.  The patient was extubated in the operating room and subsequently transported to the surgical intensive care unit in stable condition. There were no intraoperative complications. All sponge instrument and needle counts are verified correct at completion of the operation.     Valentina Gu. Roxy Manns MD 04/11/2020 12:41 PM

## 2020-04-11 NOTE — Anesthesia Postprocedure Evaluation (Signed)
Anesthesia Post Note  Patient: Tyler [name]  Procedure(s) Performed: MINIMALLY INVASIVE MITRAL VALVE REPAIR (MVR) USING MEMO 4D RING SIZE 30MM (Right Chest) TRANSESOPHAGEAL ECHOCARDIOGRAM (TEE) (N/A )     Patient location during evaluation: SICU Anesthesia Type: General Level of consciousness: awake, awake and alert, oriented and sedated Pain management: pain level controlled Vital Signs Assessment: post-procedure vital signs reviewed and stable Respiratory status: spontaneous breathing, nonlabored ventilation and patient connected to nasal cannula oxygen Cardiovascular status: blood pressure returned to baseline Anesthetic complications: no Comments: Patient extubated in OR. Now in ICU, awake, sleepy but answers questions appropriately   No complications documented.  Last Vitals:  Vitals:   04/11/20 1500 04/11/20 1600  BP: 94/77 101/78  Pulse: 88 88  Resp: 14 18  Temp: (!) 35.8 C 36.6 C  SpO2: 94% 96%    Last Pain:  Vitals:   04/11/20 1600  TempSrc: Axillary  PainSc: 0-No pain                 Graham Doukas COKER

## 2020-04-12 ENCOUNTER — Inpatient Hospital Stay (HOSPITAL_COMMUNITY): Payer: PPO

## 2020-04-12 ENCOUNTER — Encounter (HOSPITAL_COMMUNITY): Payer: Self-pay | Admitting: Thoracic Surgery (Cardiothoracic Vascular Surgery)

## 2020-04-12 ENCOUNTER — Other Ambulatory Visit: Payer: Self-pay

## 2020-04-12 DIAGNOSIS — Z9889 Other specified postprocedural states: Secondary | ICD-10-CM

## 2020-04-12 DIAGNOSIS — I340 Nonrheumatic mitral (valve) insufficiency: Secondary | ICD-10-CM

## 2020-04-12 LAB — CBC
HCT: 33.2 % — ABNORMAL LOW (ref 39.0–52.0)
HCT: 35.9 % — ABNORMAL LOW (ref 39.0–52.0)
Hemoglobin: 11 g/dL — ABNORMAL LOW (ref 13.0–17.0)
Hemoglobin: 11.5 g/dL — ABNORMAL LOW (ref 13.0–17.0)
MCH: 30.9 pg (ref 26.0–34.0)
MCH: 30.6 pg (ref 26.0–34.0)
MCHC: 33.1 g/dL (ref 30.0–36.0)
MCHC: 32 g/dL (ref 30.0–36.0)
MCV: 93.3 fL (ref 80.0–100.0)
MCV: 95.5 fL (ref 80.0–100.0)
Platelets: 83 10*3/uL — ABNORMAL LOW (ref 150–400)
Platelets: 50 10*3/uL — ABNORMAL LOW (ref 150–400)
RBC: 3.56 MIL/uL — ABNORMAL LOW (ref 4.22–5.81)
RBC: 3.76 MIL/uL — ABNORMAL LOW (ref 4.22–5.81)
RDW: 13 % (ref 11.5–15.5)
RDW: 13.2 % (ref 11.5–15.5)
WBC: 9.6 10*3/uL (ref 4.0–10.5)
WBC: 10.7 10*3/uL — ABNORMAL HIGH (ref 4.0–10.5)
nRBC: 0 % (ref 0.0–0.2)
nRBC: 0 % (ref 0.0–0.2)

## 2020-04-12 LAB — BASIC METABOLIC PANEL
Anion gap: 8 (ref 5–15)
Anion gap: 9 (ref 5–15)
BUN: 10 mg/dL (ref 8–23)
BUN: 14 mg/dL (ref 8–23)
CO2: 23 mmol/L (ref 22–32)
CO2: 24 mmol/L (ref 22–32)
Calcium: 8.3 mg/dL — ABNORMAL LOW (ref 8.9–10.3)
Calcium: 8.4 mg/dL — ABNORMAL LOW (ref 8.9–10.3)
Chloride: 106 mmol/L (ref 98–111)
Chloride: 105 mmol/L (ref 98–111)
Creatinine, Ser: 0.64 mg/dL (ref 0.61–1.24)
Creatinine, Ser: 0.96 mg/dL (ref 0.61–1.24)
GFR, Estimated: >60 mL/min (ref >60)
GFR, Estimated: >60 mL/min (ref >60)
Glucose, Bld: 109 mg/dL — ABNORMAL HIGH (ref 70–99)
Glucose, Bld: 128 mg/dL — ABNORMAL HIGH (ref 70–99)
Potassium: 4.4 mmol/L (ref 3.5–5.1)
Potassium: 4 mmol/L (ref 3.5–5.1)
Sodium: 137 mmol/L (ref 135–145)
Sodium: 138 mmol/L (ref 135–145)

## 2020-04-12 LAB — MAGNESIUM
Magnesium: 2.3 mg/dL (ref 1.7–2.4)
Magnesium: 2.2 mg/dL (ref 1.7–2.4)

## 2020-04-12 LAB — GLUCOSE, CAPILLARY
Glucose-Capillary: 102 mg/dL — ABNORMAL HIGH (ref 70–99)
Glucose-Capillary: 104 mg/dL — ABNORMAL HIGH (ref 70–99)
Glucose-Capillary: 108 mg/dL — ABNORMAL HIGH (ref 70–99)
Glucose-Capillary: 118 mg/dL — ABNORMAL HIGH (ref 70–99)
Glucose-Capillary: 128 mg/dL — ABNORMAL HIGH (ref 70–99)
Glucose-Capillary: 140 mg/dL — ABNORMAL HIGH (ref 70–99)

## 2020-04-12 MED ORDER — INSULIN ASPART 100 UNIT/ML ~~LOC~~ SOLN: 0.0000 [IU] | SUBCUTANEOUS | Status: DC

## 2020-04-12 MED ORDER — PANTOPRAZOLE SODIUM 40 MG PO TBEC
40.0000 mg | DELAYED_RELEASE_TABLET | Freq: Every day | ORAL | Status: DC
  Administered 2020-04-12 – 2020-04-15 (×4): 40 mg via ORAL
  Filled 2020-04-12 (×4): qty 1

## 2020-04-12 MED ORDER — INSULIN ASPART 100 UNIT/ML ~~LOC~~ SOLN
0.0000 [IU] | SUBCUTANEOUS | Status: DC
  Administered 2020-04-12 – 2020-04-13 (×3): 2 [IU] via SUBCUTANEOUS

## 2020-04-12 MED ORDER — KETOROLAC TROMETHAMINE 15 MG/ML IJ SOLN
15.0000 mg | Freq: Four times a day (QID) | INTRAMUSCULAR | Status: AC
  Administered 2020-04-12 – 2020-04-13 (×5): 15 mg via INTRAVENOUS
  Filled 2020-04-12 (×5): qty 1

## 2020-04-12 MED ORDER — ASPIRIN EC 325 MG PO TBEC
325.0000 mg | DELAYED_RELEASE_TABLET | Freq: Every day | ORAL | Status: AC
  Administered 2020-04-12: 325 mg via ORAL
  Filled 2020-04-12: qty 1

## 2020-04-12 MED ORDER — TRAZODONE HCL 50 MG PO TABS
50.0000 mg | ORAL_TABLET | Freq: Every day | ORAL | Status: DC
  Administered 2020-04-12 – 2020-04-14 (×3): 50 mg via ORAL
  Filled 2020-04-12 (×3): qty 1

## 2020-04-12 MED ORDER — ASPIRIN EC 81 MG PO TBEC: 81.0000 mg | DELAYED_RELEASE_TABLET | Freq: Every day | ORAL | Status: DC

## 2020-04-12 MED ORDER — SODIUM CHLORIDE 0.9% FLUSH
10.0000 mL | Freq: Two times a day (BID) | INTRAVENOUS | Status: DC
  Administered 2020-04-12 – 2020-04-15 (×5): 10 mL

## 2020-04-12 MED ORDER — SODIUM CHLORIDE 0.9% FLUSH: 10.0000 mL | INTRAVENOUS | Status: DC | PRN

## 2020-04-12 MED ORDER — BUPROPION HCL ER (XL) 150 MG PO TB24
150.0000 mg | ORAL_TABLET | Freq: Every day | ORAL | Status: DC
Start: 2020-04-12 — End: 2020-04-15
  Administered 2020-04-12 – 2020-04-15 (×4): 150 mg via ORAL
  Filled 2020-04-12 (×4): qty 1

## 2020-04-12 MED ORDER — ENOXAPARIN SODIUM 40 MG/0.4ML ~~LOC~~ SOLN
40.0000 mg | Freq: Every day | SUBCUTANEOUS | Status: DC
  Administered 2020-04-13 – 2020-04-14 (×2): 40 mg via SUBCUTANEOUS
  Filled 2020-04-12 (×2): qty 0.4

## 2020-04-12 MED ORDER — ESCITALOPRAM OXALATE 10 MG PO TABS
5.0000 mg | ORAL_TABLET | Freq: Every evening | ORAL | Status: DC
  Administered 2020-04-12 – 2020-04-14 (×3): 5 mg via ORAL
  Filled 2020-04-12 (×3): qty 1

## 2020-04-12 MED FILL — Potassium Chloride Inj 2 mEq/ML: INTRAVENOUS | Qty: 40 | Status: AC

## 2020-04-12 MED FILL — Electrolyte-R (PH 7.4) Solution: INTRAVENOUS | Qty: 4000 | Status: AC

## 2020-04-12 MED FILL — Heparin Sodium (Porcine) Inj 1000 Unit/ML: INTRAMUSCULAR | Qty: 20 | Status: AC

## 2020-04-12 MED FILL — Sodium Chloride IV Soln 0.9%: INTRAVENOUS | Qty: 4000 | Status: AC

## 2020-04-12 MED FILL — Lidocaine HCl Local Preservative Free (PF) Inj 2%: INTRAMUSCULAR | Qty: 15 | Status: AC

## 2020-04-12 MED FILL — Heparin Sodium (Porcine) Inj 1000 Unit/ML: INTRAMUSCULAR | Qty: 30 | Status: AC

## 2020-04-12 MED FILL — Sodium Bicarbonate IV Soln 8.4%: INTRAVENOUS | Qty: 50 | Status: AC

## 2020-04-12 NOTE — Progress Notes (Signed)
Patient ID: Tyler [name], male   DOB: 01/01/1951, 70 y.o.   MRN: [id number] [facility] Evening Rounds:  Hemodynamically stable in sinus rhythm.  Room air.  Urine output ok  Chest tube output low.  Labs pending this evening

## 2020-04-12 NOTE — Progress Notes (Addendum)
[facility] DAILY ICU PROGRESS NOTE                   [address]            [place] [address]          [phone]   1 Day Post-Op Procedure(s) (LRB): MINIMALLY INVASIVE MITRAL VALVE REPAIR (MVR) USING MEMO 4D RING SIZE 30MM (Right) TRANSESOPHAGEAL ECHOCARDIOGRAM (TEE) (N/A)  Total Length of Stay:  LOS: 1 day   Subjective: Extubated last evening.  Awake and alert.  Says pain is well controlled.  No complaints or concerns.  Objective: Vital signs in last 24 hours: Temp:  [96.26 F (35.7 C)-98.6 F (37 C)] 98.6 F (37 C) (02/09 0600) Pulse Rate:  [79-88] 88 (02/09 0600) Cardiac Rhythm: Atrial paced (02/09 0400) Resp:  [11-26] 21 (02/09 0600) BP: (94-133)/(74-88) 133/88 (02/09 0600) SpO2:  [85 %-99 %] 99 % (02/09 0600) Arterial Line BP: (110-157)/(55-76) 157/72 (02/09 0600) Weight:  [76.8 kg] 76.8 kg (02/09 0603)  Filed Weights   04/11/20 0605 04/12/20 0603  Weight: 74.8 kg 76.8 kg    Weight change: 1.956 kg   Hemodynamic parameters for last 24 hours: PAP: (15-40)/(4-28) 31/13 CO:  [3.4 L/min-5.7 L/min] 5.5 L/min CI:  [1.8 L/min/m2-3 L/min/m2] 2.9 L/min/m2  Intake/Output from previous day: 02/08 0701 - 02/09 0700 In: 5185.5 [I.V.:3396.3; Blood:400; IV Piggyback:1389.2] Out: 0093 [Urine:3170; Blood:673; Chest Tube:434]  Intake/Output this shift: No intake/output data recorded.  Current Meds: Scheduled Meds: . acetaminophen (TYLENOL) oral liquid 160 mg/5 mL  650 mg Per Tube Once  . acetaminophen  1,000 mg Oral Q6H  . aspirin EC  325 mg Oral Daily  . [START ON 04/13/2020] aspirin EC  81 mg Oral Daily  . bisacodyl  10 mg Oral Daily   Or  . bisacodyl  10 mg Rectal Daily  . buPROPion  150 mg Oral Daily  . chlorhexidine  15 mL Mouth/Throat NOW  . Chlorhexidine Gluconate Cloth  6 each Topical Daily  . docusate sodium  200 mg Oral Daily  . [START ON 04/13/2020] enoxaparin (LOVENOX) injection  40 mg Subcutaneous QHS  . escitalopram  5 mg Oral QPM  .  insulin aspart  0-24 Units Subcutaneous Q4H  . mouth rinse  15 mL Mouth Rinse BID  . pantoprazole  40 mg Oral QAC breakfast  . sodium chloride flush  10-40 mL Intracatheter Q12H  . sodium chloride flush  3 mL Intravenous Q12H  . traZODone  50 mg Oral QHS   Continuous Infusions: . sodium chloride    . albumin human    . cefUROXime (ZINACEF)  IV Stopped (04/11/20 2344)  . lactated ringers    . lactated ringers 20 mL/hr at 04/12/20 0700   PRN Meds:.albumin human, metoprolol tartrate, morphine injection, ondansetron (ZOFRAN) IV, oxyCODONE, sodium chloride flush, sodium chloride flush, traMADol  General appearance: alert, cooperative and no distress Neurologic: intact Heart: regular rate and rhythm.  Monitor shows normal sinus rhythm.  Pacemaker attached and sensing appropriately. Lungs: Breath sounds are clear anteriorly.  Chest tube drainage about 110 cc for the past 12 hours.  Chest x-ray shows poor aeration of the right lower lobe.  Left lung is essentially clear. Abdomen: Soft, nontender.  Absent bowel sounds. Extremities: All are well perfused.  Strong palpable pulse in the right foot.  The right femoral elation sites are soft and dry. Wound: Sternotomy incision is covered with a dry Aquacel dressing.  Lab Results: CBC: Recent Labs  04/11/20 1957 04/12/20 0413  WBC 9.0 9.6  HGB 11.4* 11.0*  HCT 35.4* 33.2*  PLT 54* 83*   BMET:  Recent Labs    04/11/20 1818 04/12/20 0413  NA 136 137  K 4.3 4.4  CL 106 106  CO2 23 23  GLUCOSE 125* 109*  BUN 10 10  CREATININE 0.60* 0.64  CALCIUM 8.0* 8.3*    CMET: Lab Results  Component Value Date   WBC 9.6 04/12/2020   HGB 11.0 (L) 04/12/2020   HCT 33.2 (L) 04/12/2020   PLT 83 (L) 04/12/2020   GLUCOSE 109 (H) 04/12/2020   ALT 17 04/07/2020   AST 20 04/07/2020   NA 137 04/12/2020   K 4.4 04/12/2020   CL 106 04/12/2020   CREATININE 0.64 04/12/2020   BUN 10 04/12/2020   CO2 23 04/12/2020   INR 1.4 (H) 04/11/2020    HGBA1C 5.4 04/07/2020      PT/INR:  Recent Labs    04/11/20 1328  LABPROT 17.0*  INR 1.4*   Radiology: DG Chest Port 1 View  Result Date: 04/11/2020 CLINICAL DATA:  Mitral valve repair. EXAM: PORTABLE CHEST 1 VIEW COMPARISON:  04/07/2020 FINDINGS: Swan-Ganz catheter with the tip projecting over the right ventricular outflow tract. Right-sided chest tube in satisfactory position. Mediastinal drain noted. Right lower lobe airspace disease likely reflecting atelectasis. Mild left basilar atelectasis. No other focal parenchymal opacity. No pleural effusion or pneumothorax. Stable cardiomegaly. No acute osseous abnormality. IMPRESSION: 1. Swan-Ganz catheter with the tip projecting over the right ventricular outflow tract. 2. Right-sided chest tube in satisfactory position. No pneumothorax. 3. Bibasilar atelectasis. Electronically Signed   By: Kathreen Devoid   On: 04/11/2020 14:01     Assessment/Plan: S/P Procedure(s) (LRB): MINIMALLY INVASIVE MITRAL VALVE REPAIR (MVR) USING MEMO 4D RING SIZE 30MM (Right) TRANSESOPHAGEAL ECHOCARDIOGRAM (TEE) (N/A)  -Postop day 1 minimally invasive mitral valve repair for severe mitral insufficiency.  He is in a stable sinus rhythm and has had stable hemodynamics since surgery.  He is not requiring any vasoactive drips. MAP 90-100.   Mobilize. D/C monitoring lines.   -Right lower lobe atelectasis-mobilize, encourage pulmonary hygiene.  Repeat chest x-ray tomorrow morning  -Mild expected acute blood loss anemia-monitor  -Thrombocytopenia-this is chronic.  Perioperative bleeding is well controlled.  No indication for intervention.  Continue to monitor.  -DVT prophylaxis-to begin subcutaneous enoxaparin later today.  Will need to watch Plt count closely while on enoxaparin.   -History of anxiety and depression-his usual dosing of Desyrel, Lexapro, and Wellbutrin have been resumed.  -History of GERD- PPI resumed.    Antony Odea,  PA-C [phone] 04/12/2020 7:42 AM   I have seen and examined the patient and agree with the assessment and plan as outlined.  Rexene Alberts, MD 04/12/2020 9:45 AM

## 2020-04-13 ENCOUNTER — Inpatient Hospital Stay (HOSPITAL_COMMUNITY): Payer: PPO

## 2020-04-13 LAB — BASIC METABOLIC PANEL
Anion gap: 9 (ref 5–15)
BUN: 16 mg/dL (ref 8–23)
CO2: 24 mmol/L (ref 22–32)
Calcium: 8.2 mg/dL — ABNORMAL LOW (ref 8.9–10.3)
Chloride: 105 mmol/L (ref 98–111)
Creatinine, Ser: 0.85 mg/dL (ref 0.61–1.24)
GFR, Estimated: >60 mL/min (ref >60)
Glucose, Bld: 134 mg/dL — ABNORMAL HIGH (ref 70–99)
Potassium: 3.6 mmol/L (ref 3.5–5.1)
Sodium: 138 mmol/L (ref 135–145)

## 2020-04-13 LAB — CBC
HCT: 31 % — ABNORMAL LOW (ref 39.0–52.0)
Hemoglobin: 10.5 g/dL — ABNORMAL LOW (ref 13.0–17.0)
MCH: 31.4 pg (ref 26.0–34.0)
MCHC: 33.9 g/dL (ref 30.0–36.0)
MCV: 92.8 fL (ref 80.0–100.0)
Platelets: UNDETERMINED 10*3/uL (ref 150–400)
RBC: 3.34 MIL/uL — ABNORMAL LOW (ref 4.22–5.81)
RDW: 13.2 % (ref 11.5–15.5)
WBC: 10 10*3/uL (ref 4.0–10.5)
nRBC: 0 % (ref 0.0–0.2)

## 2020-04-13 LAB — GLUCOSE, CAPILLARY
Glucose-Capillary: 123 mg/dL — ABNORMAL HIGH (ref 70–99)
Glucose-Capillary: 85 mg/dL (ref 70–99)

## 2020-04-13 MED ORDER — POTASSIUM CHLORIDE 10 MEQ/100ML IV SOLN
10.0000 meq | INTRAVENOUS | Status: AC
Start: 2020-04-13 — End: 2020-04-13
  Administered 2020-04-13 (×5): 10 meq via INTRAVENOUS
  Filled 2020-04-13 (×5): qty 100

## 2020-04-13 MED ORDER — WARFARIN - PHYSICIAN DOSING INPATIENT: Freq: Every day | Status: DC

## 2020-04-13 MED ORDER — METOPROLOL TARTRATE 12.5 MG HALF TABLET
12.5000 mg | ORAL_TABLET | Freq: Two times a day (BID) | ORAL | Status: DC
  Administered 2020-04-13 – 2020-04-15 (×5): 12.5 mg via ORAL
  Filled 2020-04-13 (×5): qty 1

## 2020-04-13 MED ORDER — ~~LOC~~ CARDIAC SURGERY, PATIENT & FAMILY EDUCATION: Freq: Once | Status: AC

## 2020-04-13 MED ORDER — WARFARIN SODIUM 2.5 MG PO TABS
2.5000 mg | ORAL_TABLET | Freq: Every day | ORAL | Status: DC
  Administered 2020-04-13 – 2020-04-14 (×2): 2.5 mg via ORAL
  Filled 2020-04-13 (×2): qty 1

## 2020-04-13 MED ORDER — FE FUMARATE-B12-VIT C-FA-IFC PO CAPS
1.0000 | ORAL_CAPSULE | Freq: Every day | ORAL | Status: DC
  Administered 2020-04-13 – 2020-04-15 (×3): 1 via ORAL
  Filled 2020-04-13 (×4): qty 1

## 2020-04-13 MED ORDER — FUROSEMIDE 10 MG/ML IJ SOLN
20.0000 mg | Freq: Two times a day (BID) | INTRAMUSCULAR | Status: AC
  Administered 2020-04-13 – 2020-04-14 (×3): 20 mg via INTRAVENOUS
  Filled 2020-04-13 (×3): qty 2

## 2020-04-13 NOTE — Anesthesia Postprocedure Evaluation (Signed)
Anesthesia Post Note  Patient: Tyler [name]  Procedure(s) Performed: MINIMALLY INVASIVE MITRAL VALVE REPAIR (MVR) USING MEMO 4D RING SIZE 30MM (Right Chest) TRANSESOPHAGEAL ECHOCARDIOGRAM (TEE) (N/A )     Patient location during evaluation: SICU Anesthesia Type: General Level of consciousness: awake and alert, patient cooperative and oriented Pain management: pain level controlled Vital Signs Assessment: post-procedure vital signs reviewed and stable Respiratory status: spontaneous breathing, nonlabored ventilation and patient connected to nasal cannula oxygen Cardiovascular status: stable Postop Assessment: no apparent nausea or vomiting Anesthetic complications: no   No complications documented.  Last Vitals:  Vitals:   04/13/20 0530 04/13/20 0854  BP:    Pulse:    Resp: 14   Temp:  36.8 C  SpO2:      Last Pain:  Vitals:   04/13/20 0854  TempSrc: Oral  PainSc:                  Arvon Schreiner COKER

## 2020-04-13 NOTE — Progress Notes (Signed)
CARDIAC REHAB PHASE I   PRE:  Rate/Rhythm: 80 SR  BP:  Supine:   Sitting: 132/86  Standing:    SaO2: 97%RA  MODE:  Ambulation: 740 ft   POST:  Rate/Rhythm: 87 SR  BP:  Supine:   Sitting: 129/85  Standing:    SaO2: 97%RA 1345-1435 Pt walked 740 ft on RA with EVA and asst x 1 for equipment. Tolerated well. Back to recliner after walk. Second walk today. Very motivated.   Graylon Good, RN BSN  04/13/2020 2:33 PM

## 2020-04-13 NOTE — Progress Notes (Addendum)
TCTS DAILY ICU PROGRESS NOTE                   [address]            [place] [address]          [phone]   2 Days Post-Op Procedure(s) (LRB): MINIMALLY INVASIVE MITRAL VALVE REPAIR (MVR) USING MEMO 4D RING SIZE 30MM (Right) TRANSESOPHAGEAL ECHOCARDIOGRAM (TEE) (N/A)  Total Length of Stay:  LOS: 2 days   Subjective: Awake and alert. Up in the bedside chair.  Had a good day yesterday and has  no complaints or concerns.  Objective: Vital signs in last 24 hours: Temp:  [97.9 F (36.6 C)-99.2 F (37.3 C)] 98.7 F (37.1 C) (02/10 0400) Pulse Rate:  [77-91] 84 (02/10 0500) Cardiac Rhythm: Normal sinus rhythm (02/10 0400) Resp:  [13-33] 14 (02/10 0530) BP: (106-139)/(70-90) 112/74 (02/10 0500) SpO2:  [85 %-98 %] 97 % (02/10 0500) Weight:  [75.5 kg] 75.5 kg (02/10 0556)  Filed Weights   04/11/20 0605 04/12/20 0603 04/13/20 0556  Weight: 74.8 kg 76.8 kg 75.5 kg    Weight change: -1.3 kg   Intake/Output from previous day: 02/09 0701 - 02/10 0700 In: 598.1 [P.O.:470; I.V.:28.1; IV Piggyback:100] Out: 2480 [Urine:2180; Chest Tube:300]  Intake/Output this shift: No intake/output data recorded.  Current Meds: Scheduled Meds: . acetaminophen  1,000 mg Oral Q6H  . bisacodyl  10 mg Oral Daily   Or  . bisacodyl  10 mg Rectal Daily  . buPROPion  150 mg Oral Daily  . Chlorhexidine Gluconate Cloth  6 each Topical Daily  . docusate sodium  200 mg Oral Daily  . enoxaparin (LOVENOX) injection  40 mg Subcutaneous QHS  . escitalopram  5 mg Oral QPM  . ferrous [id number] C-folic acid  1 capsule Oral Q breakfast  . furosemide  20 mg Intravenous BID  . ketorolac  15 mg Intravenous Q6H  . mouth rinse  15 mL Mouth Rinse BID  . metoprolol tartrate  12.5 mg Oral BID  . pantoprazole  40 mg Oral QAC breakfast  . sodium chloride flush  10-40 mL Intracatheter Q12H  . sodium chloride flush  3 mL Intravenous Q12H  . traZODone  50 mg Oral QHS  . warfarin  2.5  mg Oral q1600  . Warfarin - Physician Dosing Inpatient   Does not apply q1600   Continuous Infusions: . sodium chloride    . cefUROXime (ZINACEF)  IV 1.5 g (04/12/20 2217)  . lactated ringers    . lactated ringers Stopped (04/12/20 0824)  . potassium chloride 10 mEq (04/13/20 0737)   PRN Meds:.metoprolol tartrate, morphine injection, ondansetron (ZOFRAN) IV, oxyCODONE, sodium chloride flush, sodium chloride flush, traMADol  General appearance: alert, cooperative and no distress Neurologic: intact Heart: regular rate and rhythm.  Monitor shows normal sinus rhythm.  Lungs: Breath sounds are clear anteriorly.  Chest tube drainage has tapered off.   Chest x-ray shows improved aeration of the right lower lobe.  Left lung remains clear. Abdomen: Soft, nontender.  Extremities: All are well perfused.   Wound: Sternotomy incision is covered with a dry Aquacel dressing.  Lab Results: CBC: Recent Labs    04/12/20 1900 04/13/20 0112  WBC 10.7* 10.0  HGB 11.5* 10.5*  HCT 35.9* 31.0*  PLT 50* PLATELET CLUMPS NOTED ON SMEAR, UNABLE TO ESTIMATE   BMET:  Recent Labs    04/12/20 1900 04/13/20 0112  NA 138 138  K 4.0 3.6  CL 105 105  CO2 24 24  GLUCOSE 128* 134*  BUN 14 16  CREATININE 0.96 0.85  CALCIUM 8.4* 8.2*    CMET: Lab Results  Component Value Date   WBC 10.0 04/13/2020   HGB 10.5 (L) 04/13/2020   HCT 31.0 (L) 04/13/2020   PLT PLATELET CLUMPS NOTED ON SMEAR, UNABLE TO ESTIMATE 04/13/2020   GLUCOSE 134 (H) 04/13/2020   ALT 17 04/07/2020   AST 20 04/07/2020   NA 138 04/13/2020   K 3.6 04/13/2020   CL 105 04/13/2020   CREATININE 0.85 04/13/2020   BUN 16 04/13/2020   CO2 24 04/13/2020   INR 1.4 (H) 04/11/2020   HGBA1C 5.4 04/07/2020      PT/INR:  Recent Labs    04/11/20 1328  LABPROT 17.0*  INR 1.4*   Radiology: DG Chest Port 1 View  Result Date: 04/13/2020 CLINICAL DATA:  Mitral valve repair EXAM: PORTABLE CHEST 1 VIEW COMPARISON:  04/12/2020 FINDINGS:  Swan-Ganz catheter removed. Left jugular sheath remains in place. Two chest tubes on the right remain in place. No pneumothorax. Right lower lobe atelectasis and small effusion unchanged. Minimal left lower lobe atelectasis. IMPRESSION: Negative for pneumothorax.  Bibasilar atelectasis unchanged. Electronically Signed   By: Franchot Gallo M.D.   On: 04/13/2020 08:01     Assessment/Plan: S/P Procedure(s) (LRB): MINIMALLY INVASIVE MITRAL VALVE REPAIR (MVR) USING MEMO 4D RING SIZE 30MM (Right) TRANSESOPHAGEAL ECHOCARDIOGRAM (TEE) (N/A)  -Postop day 2 minimally invasive mitral valve repair for severe mitral insufficiency.  He is in a stable sinus rhythm and has had stable hemodynamics since surgery. Overall progressing well.  Remove tubes and d/c pacer. Transfer to progressive care when bed available.    -Right lower lobe atelectasis-improving,  encourage pulmonary hygiene.    -Mild expected acute blood loss anemia-monitor  -Thrombocytopenia-this is chronic. Plt clumping on AM lab.  No increase in peri-operative bleeding has been observed.  Coumadin has been initiated. Will d/c the ASA.   -DVT prophylaxis- on subcutaneous enoxaparin  -History of anxiety and depression-his usual dosing of Desyrel, Lexapro, and Wellbutrin have been resumed.  -History of GERD- PPI resumed.    Antony Odea, PA-C [phone] 04/13/2020 8:15 AM   I have seen and examined the patient and agree with the assessment and plan as outlined.  Rexene Alberts, MD 04/13/2020

## 2020-04-13 NOTE — Addendum Note (Signed)
Addendum  created 04/13/20 1039 by Roberts Gaudy, MD   Clinical Note Signed

## 2020-04-13 NOTE — Progress Notes (Signed)
Anesthesiology Follow-up:    70 year old male 2 days S/P minimally invasive MV repair. Extubated in OR. Awake and alert, neuro intact, in good spirits, sitting in chair, minimal pain, taking PO well.  VS: T- 36.8 BP- 122/82 HR- 76 (SR) RR- 14 O2 Sat 96% 2L O2  K-3.6 BUN/Cr.- 16/0.85 glucose- 134 H/H- 10.5/31 platelets- clumped   CXR- R. Lower lung atelectasis.   Stable post-op course. No apparent complications.  Roberts Gaudy

## 2020-04-13 NOTE — Addendum Note (Signed)
Addendum  created 04/13/20 1037 by Roberts Gaudy, MD   Clinical Note Signed, Pend clinical note

## 2020-04-13 NOTE — Addendum Note (Signed)
Addendum  created 04/13/20 1022 by Roberts Gaudy, MD   Pend clinical note

## 2020-04-13 NOTE — Progress Notes (Signed)
TCTS Evening Rounds  POD #2 s/p MVr Stable day Continues with chest tubes No complaints  BP 130/87   Pulse 84   Temp (!) 97.5 F (36.4 C) (Oral)   Resp 18   Ht 5\' 9"  (1.753 m)   Wt 75.5 kg   SpO2 97%   BMI 24.58 kg/m  Alert/oriented CTA RRR  A/P doing well Await floor bed. Cheralyn Oliver Z. Orvan Seen, [place]

## 2020-04-14 LAB — CBC
HCT: 33.6 % — ABNORMAL LOW (ref 39.0–52.0)
Hemoglobin: 10.8 g/dL — ABNORMAL LOW (ref 13.0–17.0)
MCH: 30 pg (ref 26.0–34.0)
MCHC: 32.1 g/dL (ref 30.0–36.0)
MCV: 93.3 fL (ref 80.0–100.0)
Platelets: DECREASED 10*3/uL (ref 150–400)
RBC: 3.6 MIL/uL — ABNORMAL LOW (ref 4.22–5.81)
RDW: 13.2 % (ref 11.5–15.5)
WBC: 9.8 10*3/uL (ref 4.0–10.5)
nRBC: 0 % (ref 0.0–0.2)

## 2020-04-14 LAB — BASIC METABOLIC PANEL
Anion gap: 9 (ref 5–15)
BUN: 13 mg/dL (ref 8–23)
CO2: 28 mmol/L (ref 22–32)
Calcium: 8.5 mg/dL — ABNORMAL LOW (ref 8.9–10.3)
Chloride: 102 mmol/L (ref 98–111)
Creatinine, Ser: 0.73 mg/dL (ref 0.61–1.24)
GFR, Estimated: >60 mL/min (ref >60)
Glucose, Bld: 101 mg/dL — ABNORMAL HIGH (ref 70–99)
Potassium: 4 mmol/L (ref 3.5–5.1)
Sodium: 139 mmol/L (ref 135–145)

## 2020-04-14 LAB — PROTIME-INR
INR: 1.2 (ref 0.8–1.2)
Prothrombin Time: 14.5 seconds (ref 11.4–15.2)

## 2020-04-14 LAB — MAGNESIUM: Magnesium: 1.8 mg/dL (ref 1.7–2.4)

## 2020-04-14 MED ORDER — MAGNESIUM SULFATE 4 GM/100ML IV SOLN
4.0000 g | Freq: Once | INTRAVENOUS | Status: AC
  Administered 2020-04-14: 4 g via INTRAVENOUS
  Filled 2020-04-14: qty 100

## 2020-04-14 NOTE — Discharge Instructions (Addendum)
Discharge Instructions:  1. You may shower, please wash incisions daily with soap and water and keep dry.  If you wish to cover wounds with dressing you may do so but please keep clean and change daily.  No tub baths or swimming until incisions have completely healed.  If your incisions become red or develop any drainage please call our office at [phone]  2. No Driving until cleared by Dr. Guy Sandifer office and you are no longer using narcotic pain medications  3. Monitor your weight daily.. Please use the same scale and weigh at same time... If you gain 5-10 lbs in 48 hours with associated lower extremity swelling, please contact our office at [phone]  4. Fever of 101.5 for at least 24 hours with no source, please contact our office at [phone]  5. Activity- up as tolerated, please walk at least 3 times per day.  Avoid strenuous activity, no lifting, pushing, or pulling with your arms over 8-10 lbs for a minimum of 6 weeks  6. If any questions or concerns arise, please do not hesitate to contact our office at [phone]  Information on my medicine - Coumadin   (Warfarin)  This medication education was reviewed with me or my healthcare representative as part of my discharge preparation.  The pharmacist that spoke with me during my hospital stay was:  Pat Patrick, [facility]  Why was Coumadin prescribed for you? Coumadin was prescribed for you because you have a blood clot or a medical condition that can cause an increased risk of forming blood clots. Blood clots can cause serious health problems by blocking the flow of blood to the heart, lung, or brain. Coumadin can prevent harmful blood clots from forming. As a reminder your indication for Coumadin is:   Blood Clot Prevention After Heart Valve Surgery  What test will check on my response to Coumadin? While on Coumadin (warfarin) you will need to have an INR test regularly to ensure that your dose is keeping you in the desired  range. The INR (international normalized ratio) number is calculated from the result of the laboratory test called prothrombin time (PT).  If an INR APPOINTMENT HAS NOT ALREADY BEEN MADE FOR YOU please schedule an appointment to have this lab work done by your health care provider within 7 days. Your INR goal is usually a number between:  2 to 3 or your provider may give you a more narrow range like 2-2.5.  Ask your health care provider during an office visit what your goal INR is.  What  do you need to  know  About  COUMADIN? Take Coumadin (warfarin) exactly as prescribed by your healthcare provider about the same time each day.  DO NOT stop taking without talking to the doctor who prescribed the medication.  Stopping without other blood clot prevention medication to take the place of Coumadin may increase your risk of developing a new clot or stroke.  Get refills before you run out.  What do you do if you miss a dose? If you miss a dose, take it as soon as you remember on the same day then continue your regularly scheduled regimen the next day.  Do not take two doses of Coumadin at the same time.  Important Safety Information A possible side effect of Coumadin (Warfarin) is an increased risk of bleeding. You should call your healthcare provider right away if you experience any of the following: ? Bleeding from an injury or your nose that does  not stop. ? Unusual colored urine (red or dark brown) or unusual colored stools (red or black). ? Unusual bruising for unknown reasons. ? A serious fall or if you hit your head (even if there is no bleeding).  Some foods or medicines interact with Coumadin (warfarin) and might alter your response to warfarin. To help avoid this: ? Eat a balanced diet, maintaining a consistent amount of Vitamin K. ? Notify your provider about major diet changes you plan to make. ? Avoid alcohol or limit your intake to 1 drink for women and 2 drinks for men per day. (1  drink is 5 oz. wine, 12 oz. beer, or 1.5 oz. liquor.)  Make sure that ANY health care provider who prescribes medication for you knows that you are taking Coumadin (warfarin).  Also make sure the healthcare provider who is monitoring your Coumadin knows when you have started a new medication including herbals and non-prescription products.  Coumadin (Warfarin)  Major Drug Interactions  Increased Warfarin Effect Decreased Warfarin Effect  Alcohol (large quantities) Antibiotics (esp. Septra/Bactrim, Flagyl, Cipro) Amiodarone (Cordarone) Aspirin (ASA) Cimetidine (Tagamet) Megestrol (Megace) NSAIDs (ibuprofen, naproxen, etc.) Piroxicam (Feldene) Propafenone (Rythmol SR) Propranolol (Inderal) Isoniazid (INH) Posaconazole (Noxafil) Barbiturates (Phenobarbital) Carbamazepine (Tegretol) Chlordiazepoxide (Librium) Cholestyramine (Questran) Griseofulvin Oral Contraceptives Rifampin Sucralfate (Carafate) Vitamin K   Coumadin (Warfarin) Major Herbal Interactions  Increased Warfarin Effect Decreased Warfarin Effect  Garlic Ginseng Ginkgo biloba Coenzyme Q10 Green tea St. Johns wort    Coumadin (Warfarin) FOOD Interactions  Eat a consistent number of servings per week of foods HIGH in Vitamin K (1 serving =  cup)  Collards (cooked, or boiled & drained) Kale (cooked, or boiled & drained) Mustard greens (cooked, or boiled & drained) Parsley *serving size only =  cup Spinach (cooked, or boiled & drained) Swiss chard (cooked, or boiled & drained) Turnip greens (cooked, or boiled & drained)  Eat a consistent number of servings per week of foods MEDIUM-HIGH in Vitamin K (1 serving = 1 cup)  Asparagus (cooked, or boiled & drained) Broccoli (cooked, boiled & drained, or raw & chopped) Brussel sprouts (cooked, or boiled & drained) *serving size only =  cup Lettuce, raw (green leaf, endive, romaine) Spinach, raw Turnip greens, raw & chopped   These websites have more information  on Coumadin (warfarin):  FailFactory.se; VeganReport.com.au;

## 2020-04-14 NOTE — Progress Notes (Signed)
CARDIAC REHAB PHASE I   PRE:  Rate/Rhythm: 76 SR  BP:  Supine:   Sitting: 115/83  Standing:    SaO2: 93%RA  MODE:  Ambulation: 740 ft   POST:  Rate/Rhythm: 81  BP:  Supine:   Sitting: 102/79  Standing:    SaO2: 96%RA 1330-1430 Pt walked 740 ft on RA with rolling walker and minimal asst. Tolerated well. To recliner after walk. Education completed with pt and wife who voiced understanding. Reviewed wound care, watching sodium, restrictions, walking for ex and CRP 2. Referred to [place] program. Pt very interested in attending. Pt has rolling walker if needed at home.   Graylon Good, RN BSN  04/14/2020 2:25 PM

## 2020-04-14 NOTE — Progress Notes (Addendum)
TCTS DAILY ICU PROGRESS NOTE                   [address]            [place] [address]          [phone]   3 Days Post-Op Procedure(s) (LRB): MINIMALLY INVASIVE MITRAL VALVE REPAIR (MVR) USING MEMO 4D RING SIZE 30MM (Right) TRANSESOPHAGEAL ECHOCARDIOGRAM (TEE) (N/A)  Total Length of Stay:  LOS: 3 days   Subjective: Awake and alert. Continuing to progress and has no complaints or concerns.  Objective: Vital signs in last 24 hours: Temp:  [97.5 F (36.4 C)-99.8 F (37.7 C)] 97.8 F (36.6 C) (02/11 0740) Pulse Rate:  [76-92] 80 (02/11 0700) Cardiac Rhythm: Normal sinus rhythm (02/10 2000) Resp:  [12-24] 12 (02/11 0700) BP: (120-148)/(73-100) 136/94 (02/11 0700) SpO2:  [90 %-100 %] 97 % (02/11 0700) Weight:  [75.9 kg] 75.9 kg (02/11 0500)  Filed Weights   04/12/20 0603 04/13/20 0556 04/14/20 0500  Weight: 76.8 kg 75.5 kg 75.9 kg    Weight change: 0.4 kg   Intake/Output from previous day: 02/10 0701 - 02/11 0700 In: 1411.9 [P.O.:680; I.V.:10; IV Piggyback:721.9] Out: 2326 [Urine:2145; Stool:1; Chest Tube:180]  Intake/Output this shift: No intake/output data recorded.  Current Meds: Scheduled Meds: . acetaminophen  1,000 mg Oral Q6H  . bisacodyl  10 mg Oral Daily   Or  . bisacodyl  10 mg Rectal Daily  . buPROPion  150 mg Oral Daily  . Chlorhexidine Gluconate Cloth  6 each Topical Daily  . docusate sodium  200 mg Oral Daily  . enoxaparin (LOVENOX) injection  40 mg Subcutaneous QHS  . escitalopram  5 mg Oral QPM  . ferrous [id number] C-folic acid  1 capsule Oral Q breakfast  . furosemide  20 mg Intravenous BID  . mouth rinse  15 mL Mouth Rinse BID  . metoprolol tartrate  12.5 mg Oral BID  . pantoprazole  40 mg Oral QAC breakfast  . sodium chloride flush  10-40 mL Intracatheter Q12H  . sodium chloride flush  3 mL Intravenous Q12H  . traZODone  50 mg Oral QHS  . warfarin  2.5 mg Oral q1600  . Warfarin - Physician Dosing Inpatient    Does not apply q1600   Continuous Infusions: . sodium chloride    . lactated ringers    . lactated ringers Stopped (04/12/20 0824)  . magnesium sulfate bolus IVPB     PRN Meds:.metoprolol tartrate, morphine injection, ondansetron (ZOFRAN) IV, oxyCODONE, sodium chloride flush, sodium chloride flush, traMADol  General appearance: alert, cooperative and no distress Neurologic: intact Heart: regular rate and rhythm.  Monitor shows normal sinus rhythm.  Lungs: Breath sounds are clear anteriorly.  Chest tube drainage has tapered off.   . Abdomen: Soft, nontender.  Extremities: All are well perfused.   Wound: Sternotomy incision is covered with a dry Aquacel dressing.  Lab Results: CBC: Recent Labs    04/13/20 0112 04/14/20 0140  WBC 10.0 9.8  HGB 10.5* 10.8*  HCT 31.0* 33.6*  PLT PLATELET CLUMPS NOTED ON SMEAR, UNABLE TO ESTIMATE PLATELET CLUMPS NOTED ON SMEAR, COUNT APPEARS DECREASED   BMET:  Recent Labs    04/13/20 0112 04/14/20 0140  NA 138 139  K 3.6 4.0  CL 105 102  CO2 24 28  GLUCOSE 134* 101*  BUN 16 13  CREATININE 0.85 0.73  CALCIUM 8.2* 8.5*    CMET: Lab Results  Component Value Date  WBC 9.8 04/14/2020   HGB 10.8 (L) 04/14/2020   HCT 33.6 (L) 04/14/2020   PLT  04/14/2020    PLATELET CLUMPS NOTED ON SMEAR, COUNT APPEARS DECREASED   GLUCOSE 101 (H) 04/14/2020   ALT 17 04/07/2020   AST 20 04/07/2020   NA 139 04/14/2020   K 4.0 04/14/2020   CL 102 04/14/2020   CREATININE 0.73 04/14/2020   BUN 13 04/14/2020   CO2 28 04/14/2020   INR 1.2 04/14/2020   HGBA1C 5.4 04/07/2020      PT/INR:  Recent Labs    04/14/20 0140  LABPROT 14.5  INR 1.2   Radiology: No results found.   Assessment/Plan: S/P Procedure(s) (LRB): MINIMALLY INVASIVE MITRAL VALVE REPAIR (MVR) USING MEMO 4D RING SIZE 30MM (Right) TRANSESOPHAGEAL ECHOCARDIOGRAM (TEE) (N/A)  -Postop day 3 minimally invasive mitral valve repair for severe mitral insufficiency.  He is in a stable  sinus rhythm and has had stable hemodynamics since surgery. Overall progressing well.  Remove tubes . Transfer to progressive care when bed available.    -Right lower lobe atelectasis- improved, maintaining acceptable sats on RA. Continue encouraging pulmonary hygiene.    -Mild expected acute blood loss anemia-monitor  -Thrombocytopenia-this is chronic. Plt clumping again on AM lab.  No increase in peri-operative bleeding has been observed.  Coumadin has been initiated. Will d/c the ASA.  INR 1.2  -DVT prophylaxis- on subcutaneous enoxaparin  -History of anxiety and depression-his usual dosing of Desyrel, Lexapro, and Wellbutrin have been resumed.  -History of GERD- PPI resumed.   -Hypomagnesemia-supplement today.   -Disposition- anticipate he will be ready for discharge to home tomorrow.    Antony Odea, PA-C [phone] 04/14/2020 7:42 AM     I have seen and examined the patient and agree with the assessment and plan as outlined.  D/C pacing wires and chest tubes.  Mobilize.  Anticipate possible d/c home tomorrow.  Rexene Alberts, MD 04/14/2020 8:06 AM

## 2020-04-14 NOTE — Progress Notes (Signed)
Removed epidural pacing wires per MD order.  INR 1.2 today.  VVS and HR/rhythm remains stable.  Currently on bedrest x1 hour.  Will continue to monitor for any changes.  Wife at bedside.

## 2020-04-14 NOTE — Discharge Summary (Addendum)
Physician Discharge Summary  Patient ID: Tyler [name] MRN: [id number] DOB/AGE: Sep 02, 1950 70 y.o.  Admit date: 04/11/2020 Discharge date: 04/15/2020  Admission Diagnosis:  Severe mitral insufficiency History of gastroesophageal reflux disease History of anxiety and depression Chronic thrombocytopenia History of hypertension    Discharge Diagnoses:   Mitral regurgitation S/P minimally invasive mitral valve repair Expected acute blood loss anemia History of gastroesophageal reflux disease History of anxiety and depression Chronic thrombocytopenia History of hypertension   Discharged Condition: Stable  History of Present Illness (Initial consultation 12/28/19) Patient is a 70 year old gentleman with history of hypertension and depression who has been referred for surgical consultation for newly discovered mitral valve prolapse with severe mitral regurgitation.   Patient states that he has been relatively healthy all of his adult life.  He takes multiple medications for hypertension and major depression.  He has never had any significant cardiac history.  He was recently noted to have a prominent systolic murmur on routine physical exam by his primary care physician.  Subsequent transthoracic echocardiogram demonstrated mitral valve prolapse with severe mitral regurgitation.  He was referred for cardiology consultation and has been evaluated previously by Dr. Davina Poke.  Transesophageal echocardiogram and diagnostic cardiac catheterization were performed on December 22, 2019.  TEE confirmed the presence of myxomatous degenerative disease of the mitral valve with a large flail segment involving the A3 portion of the anterior leaflet with obvious ruptured primary chordae tendinae.  There was severe mitral regurgitation with flow reversal noted in all 4 pulmonary veins.  PISA radius measured 1.4 cm corresponding to ERO 1.02 cm and regurgitant volume of 154 mL.  Left ventricular size and  systolic function was normal.  There was moderate left atrial enlargement.  No other significant abnormalities were noted.  Diagnostic cardiac catheterization revealed minimal nonobstructive coronary artery disease.  Right heart pressures were not elevated but there were large V waves on wedge tracing consistent with severe mitral regurgitation.  Elective surgical consultation was requested.   Patient is married and lives locally with his wife.  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  He does not exercise on a regular basis but he reports being functionally independent.  He drives an automobile, does chores around the house, and reports no significant physical limitations.  He specifically denies any symptoms of exertional shortness of breath or chest discomfort.  He has never had any resting shortness of breath, PND, orthopnea, or lower extremity edema.  He denies any history of palpitations.  He has occasional mild dizzy spells without any history of syncope.   (subsequent consultation 03/27/20) Patient returns to the office today for follow-up of recently discovered mitral valve prolapse with severe mitral regurgitation.  He was originally seen in consultation on December 28, 2019 and plans for elective mitral valve repair were initially made for November. However, the patient suffered a fall at home and then later developed minimally symptomatic COVID-19 infection in early December. He completed his quarantine and was finally seen by his dentist for clearance and found to have a problematic tooth with chronic infection that needed extraction.  Once he finally got his tooth extracted surgery was postponed yet again because of restrictions placed on elective surgeries because of the recent surgeon Covid infections.  The patient returns her office today with hopes to proceed with elective mitral valve repair on April 11, 2020.  He reports no new problems or  complaints.  He continues to deny any symptoms of  exertional shortness of breath, fatigue or chest discomfort.   Hospital Course:  Mr. Berti was admitted for elective surgery on 04/11/20 and taken to the OR where the mitral valve was repaired by placing several PTFE neo-chords, performing an annuloplasty with a 39mm MEMO 4D ring and cleft repair. Following the procedure, he separated brom cardiopulmonary bypass without difficulty, was extubated in the OR,  and was transferred to the cardiovascular ICU in stable condition.  He remained hemodynamically stable.  He resumed oral intake on the evening of surgery and was tolerating a regular diet by the morning of postop day 1.  Monitoring lines were removed.  He was mobilized on postop day 1.  Diuresis was initiated for expected volume excess.  His initial postoperative platelet count was 51,000 and had improved to 83,000 by the morning of postop day 1.  Given his history of thrombocytopenia, this was not unexpected and was monitored throughout the admission. The chest tubes were removed on postop day 2.  Anticoagulation was begun with low-dose Coumadin. INR was monitored daily. INR at the time of discharge was 1.1 on a Coumadin dose of 2.5 mg/day.  He quickly regained independence with mobility and was easily weaned from supplemental oxygen. His incisions were healing with no evidence of complication at the time of discharge. As discussed with Dr. Roxy Manns, will give Coumadin 5 mg prior to discharge then continue with 2.5 mg daily (or as directed). He is felt surgically stable for discharge today.  Consults: None  Significant Diagnostic Studies:   CLINICAL DATA:  Mitral valve repair  EXAM: PORTABLE CHEST 1 VIEW  COMPARISON:  04/12/2020  FINDINGS: Swan-Ganz catheter removed. Left jugular sheath remains in place. Two chest tubes on the right remain in place. No pneumothorax. Right lower lobe atelectasis and small effusion unchanged. Minimal  left lower lobe atelectasis.  IMPRESSION: Negative for pneumothorax.  Bibasilar atelectasis unchanged.   Electronically Signed   By: Franchot Gallo M.D.   On: 04/13/2020 08:01  Treatments:  CARDIOTHORACIC SURGERY OPERATIVE NOTE  Date of Procedure:                04/11/2020  Preoperative Diagnosis:      Severe Mitral Regurgitation  Postoperative Diagnosis:    Same  Procedure:        Minimally-Invasive Mitral Valve Repair             Complex valvuloplasty including artificial Gore-tex neochord placement x8             Suture plication of posteriorl commissure             Sorin Memo 4D Ring Annuloplasty (size 60mm, catalog # [id number], serial # [id number])               Surgeon:        Valentina Gu. Roxy Manns, MD  Assistant:       Enid Cutter, PA-C  Anesthesia:    Roberts Gaudy, MD  Operative Findings: ? Fibroelastic deficiency type myxomatous degenerative disease ? Multiple ruptured primary chordae tendinae with flail A3 segment of anterior leaflet ? Type II dysfunction with severe mitral regurgitation ? Normal left ventricular systolic function ? No residual mitral regurgitation after successful valve repair                BRIEF CLINICAL NOTE AND INDICATIONS FOR SURGERY  Patient is a 70 year old gentleman with history of hypertension and depression who has been referred for surgical consultation for newly discovered mitral valve prolapse with severe  mitral regurgitation.  Patient states that he has been relatively healthy all of his adult life. He takes multiple medications for hypertension and major depression. He has never had any significant cardiac history. He was recently noted to have a prominent systolic murmur on routine physical exam by his primary care physician. Subsequent transthoracic echocardiogram demonstrated mitral valve prolapse with severe mitral regurgitation. He was referred for cardiology consultation and has been  evaluated previously by Dr. Davina Poke. Transesophageal echocardiogram and diagnostic cardiac catheterization were performed on December 22, 2019. TEE confirmed the presence of myxomatous degenerative disease of the mitral valve with a large flail segment involving the A3 portion of the anterior leaflet with obvious ruptured primary chordae tendinae.There was severe mitral regurgitation with flow reversal noted in all 4 pulmonary veins. PISAradius measured 1.4 cm corresponding to ERO1.02 cm and regurgitant volume of 154 mL. Left ventricular size and systolic function was normal. There was moderate left atrial enlargement. No other significant abnormalities were noted. Diagnostic cardiac catheterization revealed minimal nonobstructive coronary artery disease. Right heart pressures were not elevated but there were large V waves on wedge tracing consistent with severe mitral regurgitation. Elective surgical consultation was requested.  The patient has been seen in consultation and counseled at length regarding the indications, risks and potential benefits of surgery.  All questions have been answered, and the patient provides full informed consent for the operation as described  Discharge Exam: Blood pressure 116/84, pulse 88, temperature 98 F (36.7 C), temperature source Oral, resp. rate 15, height 5\' 9"  (1.753 m), weight 71.8 kg, SpO2 91 %.  Cardiovascular: RRR, no murmur Pulmonary: Clear to auscultation bilaterally Abdomen: Soft, non tender, bowel sounds present. Extremities: No lower extremity edema. Wounds: Aquacel removed and wound is clean and dry.  No erythema or signs of infection.   Disposition: Discharge disposition: 01-Home or Self Care       Discharge Instructions    Amb Referral to Cardiac Rehabilitation   Complete by: As directed    Diagnosis: Valve Repair   Valve: Mitral Comment - mini   After initial evaluation and assessments completed: Virtual Based Care may be  provided alone or in conjunction with Phase 2 Cardiac Rehab based on patient barriers.: Yes     Allergies as of 04/15/2020   No Known Allergies     Medication List    STOP taking these medications   aspirin EC 81 MG tablet   irbesartan 150 MG tablet Commonly known as: AVAPRO     TAKE these medications   buPROPion 150 MG 24 hr tablet Commonly known as: WELLBUTRIN XL Take 150 mg by mouth in the morning.   dicyclomine 10 MG capsule Commonly known as: BENTYL TAKE 1 CAPSULE BY MOUTH 3 TIMES DAILY AS NEEDED FOR SPASMS (ABD PAIN AND CRAMPING). What changed: See the new instructions.   escitalopram 5 MG tablet Commonly known as: LEXAPRO Take 5 mg by mouth every evening.   ferrous sulfate 325 (65 FE) MG tablet Take 1 tablet (325 mg total) by mouth daily with breakfast. For one month then stop. If develops constipation, may take softener or laxative or stop medication   hydroxypropyl methylcellulose / hypromellose 2.5 % ophthalmic solution Commonly known as: ISOPTO TEARS / GONIOVISC Place 1 drop into both eyes 3 (three) times daily as needed (for dryness).   metoprolol tartrate 25 MG tablet Commonly known as: LOPRESSOR Take 0.5 tablets (12.5 mg total) by mouth 2 (two) times daily.   oxyCODONE 5 MG immediate release tablet  Commonly known as: Oxy IR/ROXICODONE Take 1 tablet (5 mg total) by mouth every 4 (four) hours as needed for severe pain.   pantoprazole 40 MG tablet Commonly known as: PROTONIX TAKE 1 TABLET BY MOUTH EVERY DAY What changed: when to take this   sildenafil 100 MG tablet Commonly known as: VIAGRA Take 100 mg by mouth daily as needed for erectile dysfunction.   traZODone 50 MG tablet Commonly known as: DESYREL Take 50 mg by mouth at bedtime.   warfarin 2.5 MG tablet Commonly known as: COUMADIN Take 1 tablet (2.5 mg total) by mouth daily at 4 PM. Or as directed.       Follow-up Information    Lendon Colonel, NP. Go on 04/28/2020.   Specialties:  Nurse Practitioner, Radiology, Cardiology Why: Your appointment is at 8:45 AM. Contact information: [address] [place] [address] [phone]        [facility] [place]. Go on 04/24/2020.   Specialty: Cardiothoracic Surgery Why: Your appointment for suture removal is on Monday, 04/24/2020 at 11 AM Contact information: [place], [place]       Rexene Alberts, MD. Go on 05/08/2020.   Specialty: Cardiothoracic Surgery Why: Your appointment is at 12:30 PM.  Please arrive 30 minutes early for a chest x-ray to be performed by [facility] located on the first floor of the same building. Contact information: [place] [address] [place], Courtney Paris, MD .   Specialty: Nephrology Contact information: [place] [address] [phone]        [place] on 05/25/2020.   Specialty: Cardiology Why: Your appointment for follow-up echo is at 10:30 AM Contact information: [address] [place] [phone]       [place] [facility]. Go on 04/17/2020.   Specialty: Cardiology Why: Your appointment for the [facility] for INR blood test is at 10;30am  Contact information: [address], [place] [phone]             The patient has been discharged on:   1.Beta Blocker:  Yes [x   ]                              No   [   ]                              If No, reason:  2.Ace Inhibitor/ARB: Yes [   ]                                     No  [ x   ]                                     If No, reason:Labile BP. Will try to restart after discharge as an outpatient  3.Statin:   Yes [   ]                  No  [  x   ]                  If No, reason:  No known coronary or vascular disease  4.[place]a [place]:  Yes  [   ]                  No   [ x  ]                  If No, reason: Thrombocytopenia    Signed: Nani Skillern, PA-C 04/15/2020, 1:30 PM

## 2020-04-15 ENCOUNTER — Inpatient Hospital Stay (HOSPITAL_COMMUNITY): Payer: PPO

## 2020-04-15 LAB — PROTIME-INR
INR: 1.1 (ref 0.8–1.2)
Prothrombin Time: 13.6 seconds (ref 11.4–15.2)

## 2020-04-15 MED ORDER — FERROUS SULFATE 325 (65 FE) MG PO TABS: 325.0000 mg | ORAL_TABLET | Freq: Every day | ORAL | 0 refills | Status: DC

## 2020-04-15 MED ORDER — OXYCODONE HCL 5 MG PO TABS: 5.0000 mg | ORAL_TABLET | ORAL | 0 refills | Status: DC | PRN

## 2020-04-15 MED ORDER — WARFARIN SODIUM 5 MG PO TABS: 5.0000 mg | ORAL_TABLET | Freq: Every day | ORAL | Status: DC

## 2020-04-15 MED ORDER — METOPROLOL TARTRATE 25 MG PO TABS: 12.5000 mg | ORAL_TABLET | Freq: Two times a day (BID) | ORAL | 1 refills | Status: DC

## 2020-04-15 MED ORDER — WARFARIN SODIUM 5 MG PO TABS
5.0000 mg | ORAL_TABLET | Freq: Every day | ORAL | Status: DC
  Administered 2020-04-15: 5 mg via ORAL
  Filled 2020-04-15: qty 1

## 2020-04-15 MED ORDER — WARFARIN SODIUM 2.5 MG PO TABS: 5.0000 mg | ORAL_TABLET | Freq: Every day | ORAL | 1 refills | Status: DC

## 2020-04-15 MED ORDER — WARFARIN SODIUM 2.5 MG PO TABS: 2.5000 mg | ORAL_TABLET | Freq: Every day | ORAL | 1 refills | Status: DC

## 2020-04-15 NOTE — Progress Notes (Signed)
Mobility Specialist: Progress Note   04/15/20 1217  Mobility  Activity Ambulated in hall  Level of Assistance Independent  Assistive Device None  Distance Ambulated (ft) 470 ft  Mobility Response Tolerated well  Mobility performed by Mobility specialist  Bed Position Chair  $Mobility charge 1 Mobility   Pre-Mobility: 81 HR During Mobility: 86 HR Post-Mobility: 82 HR, 119/81 BP, 100% SpO2  Pt asx during ambulation. Pt back to chair after walk.   [facility] Lasheika Ortloff Mobility Specialist Mobility Specialist Phone: [phone]

## 2020-04-15 NOTE — Progress Notes (Addendum)
      [address]       [place] [address]             [phone]        4 Days Post-Op Procedure(s) (LRB): MINIMALLY INVASIVE MITRAL VALVE REPAIR (MVR) USING MEMO 4D RING SIZE 30MM (Right) TRANSESOPHAGEAL ECHOCARDIOGRAM (TEE) (N/A)  Subjective: Patient feels he needs to move his bowels this am. He has no other complaints.  Objective: Vital signs in last 24 hours: Temp:  [97.9 F (36.6 C)-99.3 F (37.4 C)] 99.3 F (37.4 C) (02/12 0426) Pulse Rate:  [73-89] 87 (02/12 0851) Cardiac Rhythm: Normal sinus rhythm (02/11 2100) Resp:  [11-25] 15 (02/12 0426) BP: (105-148)/(71-97) 112/76 (02/12 0851) SpO2:  [91 %-96 %] 91 % (02/12 0426) Weight:  [71.8 kg] 71.8 kg (02/12 0426)  Pre op weight 74.8 kg Current Weight  04/15/20 71.8 kg       Intake/Output from previous day: 02/11 0701 - 02/12 0700 In: 219.9 [P.O.:120; IV Piggyback:99.9] Out: 2765 [Urine:2725; Chest Tube:40]   Physical Exam:  Cardiovascular: RRR, no murmur Pulmonary: Clear to auscultation bilaterally Abdomen: Soft, non tender, bowel sounds present. Extremities: No lower extremity edema. Wounds: Aquacel removed and wound is clean and dry.  No erythema or signs of infection.  Lab Results: CBC: Recent Labs    04/13/20 0112 04/14/20 0140  WBC 10.0 9.8  HGB 10.5* 10.8*  HCT 31.0* 33.6*  PLT PLATELET CLUMPS NOTED ON SMEAR, UNABLE TO ESTIMATE PLATELET CLUMPS NOTED ON SMEAR, COUNT APPEARS DECREASED   BMET:  Recent Labs    04/13/20 0112 04/14/20 0140  NA 138 139  K 3.6 4.0  CL 105 102  CO2 24 28  GLUCOSE 134* 101*  BUN 16 13  CREATININE 0.85 0.73  CALCIUM 8.2* 8.5*    PT/INR:  Lab Results  Component Value Date   INR 1.1 04/15/2020   INR 1.2 04/14/2020   INR 1.4 (H) 04/11/2020   ABG:  INR: Will add last result for INR, ABG once components are confirmed Will add last 4 CBG results once components are confirmed  Assessment/Plan:  1. CV - SR with HR in the 80's this am.  On Lopressor 12.5 mg bid and Coumadin. INR this am 1.1;he has been given 2 doses of 2.5 mg of Coumadin 2.  Pulmonary - On room air. CXR this am shows small right pleural effusion. Encourage incentive spirometer. 3.  Expected post op acute blood loss anemia - H and H yesterday 10.8 and 33.6. Continue Trinsicon. 4. Thrombocytopenia-platelets clumped but count appears decreased (platelets 02/09 50,000). Platelets upon admission 134,000 5. Possible discharge;will discuss with Dr. Leeroy Cha M ZimmermanPA-C 04/15/2020,9:01 AM   I have seen and examined the patient and agree with the assessment and plan as outlined.  D/C home today.  Instructions given.  Rexene Alberts, MD 04/15/2020 11:01 AM

## 2020-04-15 NOTE — Progress Notes (Signed)
CARDIAC REHAB PHASE I   PRE:  Rate/Rhythm: 89 SR  BP:  Supine: 112/76  Sitting:   Standing:    SaO2: 93%RA  MODE:  Ambulation: 470 ft   POST:  Rate/Rhythm: 92 SR  BP:  Supine:   Sitting: 116/84  Standing:    SaO2: 94%RA 0857-0920 Pt walked 470 ft on RA with hand held asst. Gait steady. Pt has walker at home if needed. No questions re ed done. Knows if he does not go home to walk two more times today.   Graylon Good, RN BSN  04/15/2020 9:14 AM

## 2020-04-17 ENCOUNTER — Ambulatory Visit (INDEPENDENT_AMBULATORY_CARE_PROVIDER_SITE_OTHER): Payer: PPO

## 2020-04-17 ENCOUNTER — Other Ambulatory Visit: Payer: Self-pay

## 2020-04-17 DIAGNOSIS — Z9889 Other specified postprocedural states: Secondary | ICD-10-CM

## 2020-04-17 DIAGNOSIS — Z7901 Long term (current) use of anticoagulants: Secondary | ICD-10-CM | POA: Insufficient documentation

## 2020-04-17 DIAGNOSIS — Z5181 Encounter for therapeutic drug level monitoring: Secondary | ICD-10-CM

## 2020-04-17 LAB — POCT INR: INR: 1.3 — AB (ref 2.0–3.0)

## 2020-04-17 NOTE — Patient Instructions (Signed)
Take 2 tablets tonight only and then take 1 tablet Daily except 1.5 tablets on Monday, Wednesday and Friday.  INR in 1 week  A full discussion of the nature of anticoagulants has been carried out.  A benefit risk analysis has been presented to the patient, so that they understand the justification for choosing anticoagulation at this time. The need for frequent and regular monitoring, precise dosage adjustment and compliance is stressed.  Side effects of potential bleeding are discussed.  The patient should avoid any OTC items containing aspirin or ibuprofen, and should avoid great swings in general diet.  Avoid alcohol consumption.  Call if any signs of abnormal bleeding.  [phone]

## 2020-04-18 DIAGNOSIS — Z7901 Long term (current) use of anticoagulants: Secondary | ICD-10-CM | POA: Diagnosis not present

## 2020-04-18 DIAGNOSIS — I341 Nonrheumatic mitral (valve) prolapse: Secondary | ICD-10-CM | POA: Diagnosis not present

## 2020-04-18 DIAGNOSIS — D696 Thrombocytopenia, unspecified: Secondary | ICD-10-CM | POA: Diagnosis not present

## 2020-04-18 DIAGNOSIS — Z9889 Other specified postprocedural states: Secondary | ICD-10-CM | POA: Diagnosis not present

## 2020-04-18 DIAGNOSIS — K219 Gastro-esophageal reflux disease without esophagitis: Secondary | ICD-10-CM | POA: Diagnosis not present

## 2020-04-18 DIAGNOSIS — M25532 Pain in left wrist: Secondary | ICD-10-CM | POA: Diagnosis not present

## 2020-04-18 DIAGNOSIS — D649 Anemia, unspecified: Secondary | ICD-10-CM | POA: Diagnosis not present

## 2020-04-18 DIAGNOSIS — E785 Hyperlipidemia, unspecified: Secondary | ICD-10-CM | POA: Diagnosis not present

## 2020-04-18 DIAGNOSIS — I340 Nonrheumatic mitral (valve) insufficiency: Secondary | ICD-10-CM | POA: Diagnosis not present

## 2020-04-18 DIAGNOSIS — I10 Essential (primary) hypertension: Secondary | ICD-10-CM | POA: Diagnosis not present

## 2020-04-18 DIAGNOSIS — R011 Cardiac murmur, unspecified: Secondary | ICD-10-CM | POA: Diagnosis not present

## 2020-04-19 ENCOUNTER — Telehealth (HOSPITAL_COMMUNITY): Payer: Self-pay

## 2020-04-19 NOTE — Telephone Encounter (Signed)
Attempted to call patient in regards to Cardiac Rehab - LM on VM 

## 2020-04-19 NOTE — Telephone Encounter (Signed)
Pt insurance is active and benefits verified through [employer]. Co-pay $15.00, DED $0.00/$0.00 met, out of pocket $3,450.00/$80.00 met, co-insurance 0%. No pre-authorization required. [place]/[employer], 04/19/20 @ 11:44AM, [facility]#[id number]  Will contact patient to see if he is interested in the Cardiac Rehab Program. If interested, patient will need to complete follow up appt. Once completed, patient will be contacted for scheduling upon review by the RN Navigator.

## 2020-04-22 ENCOUNTER — Other Ambulatory Visit: Payer: Self-pay | Admitting: Physician Assistant

## 2020-04-24 ENCOUNTER — Ambulatory Visit (INDEPENDENT_AMBULATORY_CARE_PROVIDER_SITE_OTHER): Payer: Self-pay | Admitting: *Deleted

## 2020-04-24 ENCOUNTER — Other Ambulatory Visit: Payer: Self-pay

## 2020-04-24 ENCOUNTER — Ambulatory Visit (INDEPENDENT_AMBULATORY_CARE_PROVIDER_SITE_OTHER): Payer: PPO

## 2020-04-24 DIAGNOSIS — Z9889 Other specified postprocedural states: Secondary | ICD-10-CM

## 2020-04-24 DIAGNOSIS — Z4802 Encounter for removal of sutures: Secondary | ICD-10-CM

## 2020-04-24 DIAGNOSIS — Z5181 Encounter for therapeutic drug level monitoring: Secondary | ICD-10-CM

## 2020-04-24 LAB — POCT INR: INR: 1.4 — AB (ref 2.0–3.0)

## 2020-04-24 NOTE — Patient Instructions (Signed)
Take 2 tablets tonight only and then increase to  1.5 tablets Daily except 1 tablet on Wednesday.  INR in 1 week    [phone]

## 2020-04-24 NOTE — Progress Notes (Signed)
Patient arrived for nurse visit to remove sutures post-Mini MVR 2/8 by Dr. Roxy Manns.  Three sutures removed with no signs or symptoms of infection noted.  Incisions well approximated.  Patient tolerated suture removal well.  Patient and family instructed to keep the incision site clean and dry. Patient and family acknowledged instructions given.  All questions answered.

## 2020-04-27 NOTE — Progress Notes (Signed)
Cardiology Office Note   Date:  04/28/2020   ID:  Tyler [name], DOB 1950-03-10, MRN [id number]  PCP:  Prince Solian, MD  Cardiologist: Dr. Davina Poke No chief complaint on file.    History of Present Illness: Tyler [name] is a 70 y.o. male who presents for for ongoing assessment and management of severe mitral valve regurgitation with flail A3 segment. noted on transthoracic echo 12/28/2019, which was completed due to a new murmur.  Other past medical history includes hypertension, anxiety and depression along with colonic varices.  On last visit with Dr. Davina Poke on 12/10/2019 it was recommended that the patient have a mitral valve repair.  The patient was referred to CVTS/and valvular heart clinic.  The patient was seen by Dr. [name] on 12/22/2019 and plan for a right and left heart cath.  Cath report revealed patent coronary arteries with mild coronary ectasia and nonobstructive plaquing, no high-grade stenosis was present.  The patient also had normal right heart hemodynamics and preserved cardiac output.  Patient had 24 mm V waves consistent with hemodynamically significant mitral regurgitation.  Tyler [name] underwent minimally invasive mitral valve repair on 04/11/2020.  The mitral valve was repaired by placing several PTFE neo-- cords performing an annuloplasty with a 30 mm MEMO ring and cleft repair.  By Dr. Darylene Price.  Anticoagulation was started with Coumadin at 2.5 mg daily.  Irbesartan 150 mg daily was discontinued along with enteric-coated aspirin 81 mg daily postoperatively.  He was continued on metoprolol 12.5 mg twice daily.  He was to follow-up in Coumadin clinic.  He is due to see them on February 29.  Tyler [name] comes today feeling tired with periods of dizziness.  He has not noticed any change in his energy or breathing status since having had mitral valve repair.  He denies any palpitations racing heart rate or chest pain.  He still has some pain at the site of his  surgery on the right upper chest and on the flank area.  He occasionally takes oxycodone but prefers not to use any Tylenol instead.   Past Medical History:  Diagnosis Date  . Adenomatous polyp of colon 02/2004  . Anemia   . Anxiety   . Arthritis   . BPH (benign prostatic hyperplasia)   . Colonic varices   . Degenerative joint disease   . Depression   . Depression   . Fatty liver   . GERD (gastroesophageal reflux disease)   . Heart murmur   . Hemorrhoids   . Hyperlipidemia   . Hypertension   . IBS (irritable bowel syndrome)   . Prostate cancer ([place]) 2011  . S/P minimally invasive mitral valve repair 04/11/2020   Complex valvuloplasty including artificial Gore-tex neochord placement x8 with suture plication of posterior commissure and 30 mm Sorin Memo 3D ring annuloplasty via right mini thoracotomy approach  . Thrombocytopenia ([place])   . Thyroid nodule     Past Surgical History:  Procedure Laterality Date  . BUBBLE STUDY  12/22/2019   Procedure: BUBBLE STUDY;  Surgeon: Geralynn Rile, MD;  Location: [facility];  Service: Cardiovascular;;  . CARDIAC CATHETERIZATION    . COLONOSCOPY    . MITRAL VALVE REPAIR Right 04/11/2020   Procedure: MINIMALLY INVASIVE MITRAL VALVE REPAIR (MVR) USING MEMO 4D RING SIZE 30MM;  Surgeon: Rexene Alberts, MD;  Location: [facility];  Service: Open Heart Surgery;  Laterality: Right;  . PROSTATECTOMY    . RIGHT/LEFT HEART CATH AND CORONARY ANGIOGRAPHY N/A  12/22/2019   Procedure: RIGHT/LEFT HEART CATH AND CORONARY ANGIOGRAPHY;  Surgeon: [name], MD;  Location: [facility];  Service: Cardiovascular;  Laterality: N/A;  . TEE WITHOUT CARDIOVERSION N/A 12/22/2019   Procedure: TRANSESOPHAGEAL ECHOCARDIOGRAM (TEE);  Surgeon: Geralynn Rile, MD;  Location: [facility];  Service: Cardiovascular;  Laterality: N/A;  . TEE WITHOUT CARDIOVERSION N/A 04/11/2020   Procedure: TRANSESOPHAGEAL ECHOCARDIOGRAM (TEE);  Surgeon: Rexene Alberts,  MD;  Location: [facility];  Service: Open Heart Surgery;  Laterality: N/A;  . TONSILLECTOMY    . TOOTH EXTRACTION  03/07/2020  . TYMPANOPLASTY  01/2016     Current Outpatient Medications  Medication Sig Dispense Refill  . amoxicillin (AMOXIL) 500 MG capsule Take 4 capsules (2,000 mg total) by mouth once for 1 dose. FOR DENTAL WORK 4 capsule 0  . buPROPion (WELLBUTRIN XL) 150 MG 24 hr tablet Take 150 mg by mouth in the morning.     . dicyclomine (BENTYL) 10 MG capsule TAKE 1 CAPSULE BY MOUTH 3 TIMES DAILY AS NEEDED FOR SPASMS (ABD PAIN AND CRAMPING). (Patient taking differently: Take 10 mg by mouth daily as needed for spasms (or abdominal pain/cramping).) 90 capsule 3  . escitalopram (LEXAPRO) 5 MG tablet Take 5 mg by mouth every evening.     . ferrous sulfate 325 (65 FE) MG tablet Take 1 tablet (325 mg total) by mouth daily with breakfast. For one month then stop. If develops constipation, may take softener or laxative or stop medication 30 tablet 0  . hydroxypropyl methylcellulose / hypromellose (ISOPTO TEARS / GONIOVISC) 2.5 % ophthalmic solution Place 1 drop into both eyes 3 (three) times daily as needed (for dryness).     . metoprolol succinate (TOPROL XL) 25 MG 24 hr tablet Take 0.5 tablets (12.5 mg total) by mouth daily. 45 tablet 3  . oxyCODONE (OXY IR/ROXICODONE) 5 MG immediate release tablet Take 1 tablet (5 mg total) by mouth every 4 (four) hours as needed for severe pain. 30 tablet 0  . pantoprazole (PROTONIX) 40 MG tablet TAKE 1 TABLET BY MOUTH EVERY DAY (Patient taking differently: Take 40 mg by mouth daily before breakfast.) 90 tablet 0  . sildenafil (VIAGRA) 100 MG tablet Take 100 mg by mouth daily as needed for erectile dysfunction.    . traZODone (DESYREL) 50 MG tablet Take 50 mg by mouth at bedtime.    Marland Kitchen warfarin (COUMADIN) 2.5 MG tablet Take 1 tablet (2.5 mg total) by mouth daily at 4 PM. Or as directed. 30 tablet 1   No current facility-administered medications for this visit.     Allergies:   Patient has no known allergies.    Social History:  The patient  reports that he has never smoked. He has never used smokeless tobacco. He reports current alcohol use. He reports that he does not use drugs.   Family History:  The patient's family history includes Colon polyps in his father; Depression in an other family member; Heart disease in his father and mother; Stroke in his brother.    ROS: All other systems are reviewed and negative. Unless otherwise mentioned in H&P    PHYSICAL EXAM: VS:  BP 102/76   Pulse 74   Ht 5\' 9"  (1.753 m)   Wt 160 lb 6.4 oz (72.8 kg)   BMI 23.69 kg/m  , BMI Body mass index is 23.69 kg/m. GEN: Well nourished, well developed, in no acute distress HEENT: normal Neck: no JVD, carotid bruits, or masses Cardiac: RRR;scant murmurs,  rubs, or gallops,no edema  Respiratory:  Clear to auscultation bilaterally, normal work of breathing GI: soft, nontender, nondistended, + BS MS: no deformity or atrophy Skin: warm and dry, no rash, pale  Incision site to right chest at the intercostal space, with two small puncture site incisions on the right distal to the intercostal space incision.  Neuro:  Strength and sensation are intact Psych: euthymic mood, flat affect   EKG: Normal sinus rhythm heart rate of 74 bpm with a left axis deviation.  (Personally reviewed)  Recent Labs: 12/10/2019: BNP 14.9 04/07/2020: ALT 17 04/14/2020: BUN 13; Creatinine, Ser 0.73; Hemoglobin 10.8; Magnesium 1.8; Platelets PLATELET CLUMPS NOTED ON SMEAR, COUNT APPEARS DECREASED; Potassium 4.0; Sodium 139    Lipid Panel No results found for: CHOL, TRIG, HDL, CHOLHDL, VLDL, LDLCALC, LDLDIRECT    Wt Readings from Last 3 Encounters:  04/28/20 160 lb 6.4 oz (72.8 kg)  04/15/20 158 lb 4.8 oz (71.8 kg)  04/07/20 168 lb 3.2 oz (76.3 kg)      Other studies Reviewed: TTE 12/08/2019 1. There is severe MR that is directed posteriorly and laterally. There  is splay  artifact. Suspect either flail segment that may be commissural  given rather eccentric jet. TEE is recommended for better  characterization. The mitral valve is myxomatous.  Severe mitral valve regurgitation. No evidence of mitral stenosis.  2. Left ventricular ejection fraction, by estimation, is 60 to 65%. The  left ventricle has normal function. The left ventricle has no regional  wall motion abnormalities. The left ventricular internal cavity size was  mildly dilated. Left ventricular  diastolic function could not be evaluated.  3. Right ventricular systolic function is normal. The right ventricular  size is normal. Tricuspid regurgitation signal is inadequate for assessing  PA pressure.  4. Left atrial size was mildly dilated.  5. The aortic valve is tricuspid. Aortic valve regurgitation is not  visualized. No aortic stenosis is present.  6. The inferior vena cava is normal in size with greater than 50%  respiratory variability, suggesting right atrial pressure of 3 mmHg.   [facility]?[place] 12/22/2019  Hemodynamic findings consistent with mitral valve regurgitation.   1.  Patent coronary arteries with mild coronary ectasia and nonobstructive plaquing, no high-grade stenoses present 2.  Normal right heart hemodynamics and preserved cardiac output 3.  24 mm V waves consistent with hemodynamically significant mitral regurgitation  ASSESSMENT AND PLAN:  1.  Status post mitral valve repair: In the setting of severe mitral valve regurgitation with a flail A3 segment.  The patient continues to have some generalized fatigue similar to what he was feeling prior to his surgery and has not noticed any improvement in it.  He complains of some incisional pain.  Oxycodone and Tylenol have been helpful.  Dr. Davina Poke had an opportunity to visit with the patient as well during this office visit.  I have given a prescription for amoxicillin 2 g prior to dental work which is planned for sometime next  week.  He will continue in the Coumadin clinic and will be seen early next week.    2.  Hypotension.  I have had orthostatic blood pressures completed during office visit as his initial blood pressure was 102/76.  He was not found to be orthostatic.  There was only a 5 mm difference between lying and sitting, with a standing blood pressure of 110/77.  However he is symptomatic with position change.  I am going to change his metoprolol tartrate 12.5 mg to  twice daily to metoprolol succinate 12.5 mg daily to allow for a longer acting medication.  He is not on any other medications for blood pressure control at this time.  I will check a CBC for anemia causing dizziness and hypotension especially since he is now on Coumadin.  3.  Depression anxiety: I have noted that he is at both on Wellbutrin and on Lexapro.  I have asked him to follow-up with his PCP concerning these medications.  4.  Hypokalemia: Rechecking BMET to compare to recent labs 2 and half weeks ago.   Current medicines are reviewed at length with the patient today.  I have spent 30 minutes dedicated to the care of this patient on the date of this encounter to include pre-visit review of records, assessment, management and diagnostic testing,with shared decision making.  Labs/ tests ordered today include: BMET, CBC. Phill Myron. West Pugh, ANP, [facility]   04/28/2020 10:53 AM    [facility] [address] Office [phone] Fax [phone]  Notice: This dictation was prepared with Dragon dictation along with smaller phrase technology. Any transcriptional errors that result from this process are unintentional and may not be corrected upon review.

## 2020-04-28 ENCOUNTER — Other Ambulatory Visit: Payer: Self-pay

## 2020-04-28 ENCOUNTER — Encounter: Payer: Self-pay | Admitting: Adult Health

## 2020-04-28 ENCOUNTER — Ambulatory Visit: Payer: PPO | Admitting: Adult Health

## 2020-04-28 VITALS — BP 102/76 | HR 74 | Ht 69.0 in | Wt 160.4 lb

## 2020-04-28 DIAGNOSIS — E876 Hypokalemia: Secondary | ICD-10-CM | POA: Diagnosis not present

## 2020-04-28 DIAGNOSIS — F3289 Other specified depressive episodes: Secondary | ICD-10-CM

## 2020-04-28 DIAGNOSIS — Z79899 Other long term (current) drug therapy: Secondary | ICD-10-CM

## 2020-04-28 DIAGNOSIS — Z9889 Other specified postprocedural states: Secondary | ICD-10-CM

## 2020-04-28 DIAGNOSIS — I950 Idiopathic hypotension: Secondary | ICD-10-CM

## 2020-04-28 MED ORDER — AMOXICILLIN 500 MG PO CAPS: 2000.0000 mg | ORAL_CAPSULE | Freq: Once | ORAL | 0 refills | Status: AC

## 2020-04-28 MED ORDER — METOPROLOL SUCCINATE ER 25 MG PO TB24: 12.5000 mg | ORAL_TABLET | Freq: Every day | ORAL | 3 refills | Status: DC

## 2020-04-28 NOTE — Patient Instructions (Signed)
Medication Instructions:  STOP- Metoprolol Tartrate START- Metoprolol Succinate 12.5 mg by mouth daily  *If you need a refill on your cardiac medications before your next appointment, please call your pharmacy*   Lab Work: CBC and BMP  If you have labs (blood work) drawn today and your tests are completely normal, you will receive your results only by: Marland Kitchen MyChart Message (if you have MyChart) OR . A paper copy in the mail If you have any lab test that is abnormal or we need to change your treatment, we will call you to review the results.   Testing/Procedures: None ordered   Follow-Up: At [facility], you and your health needs are our priority.  As part of our continuing mission to provide you with exceptional heart care, we have created designated Provider Care Teams.  These Care Teams include your primary Cardiologist (physician) and Advanced Practice Providers (APPs -  Physician Assistants and Nurse Practitioners) who all work together to provide you with the care you need, when you need it.  We recommend signing up for the patient portal called "MyChart".  Sign up information is provided on this After Visit Summary.  MyChart is used to connect with patients for Virtual Visits (Telemedicine).  Patients are able to view lab/test results, encounter notes, upcoming appointments, etc.  Non-urgent messages can be sent to your provider as well.   To learn more about what you can do with MyChart, go to [email or web address].    Your next appointment:   3 month(s)  The format for your next appointment:   In Person  Provider:   You may see Eleonore Chiquito, MD or one of the following Advanced Practice Providers on your designated Care Team:    Almyra Deforest, PA-C  Fabian Sharp, PA-C or   Roby Lofts, [place]

## 2020-04-29 LAB — BASIC METABOLIC PANEL
BUN/Creatinine Ratio: 15 (ref 10–24)
BUN: 15 mg/dL (ref 8–27)
CO2: 24 mmol/L (ref 20–29)
Calcium: 9.1 mg/dL (ref 8.6–10.2)
Chloride: 102 mmol/L (ref 96–106)
Creatinine, Ser: 0.97 mg/dL (ref 0.76–1.27)
GFR calc Af Amer: 90 mL/min/{1.73_m2} (ref >59)
GFR calc non Af Amer: 78 mL/min/{1.73_m2} (ref >59)
Glucose: 95 mg/dL (ref 65–99)
Potassium: 4.7 mmol/L (ref 3.5–5.2)
Sodium: 142 mmol/L (ref 134–144)

## 2020-04-29 LAB — CBC
Hematocrit: 39.8 % (ref 37.5–51.0)
Hemoglobin: 12.8 g/dL — ABNORMAL LOW (ref 13.0–17.7)
MCH: 29.6 pg (ref 26.6–33.0)
MCHC: 32.2 g/dL (ref 31.5–35.7)
MCV: 92 fL (ref 79–97)
Platelets: 163 10*3/uL (ref 150–450)
RBC: 4.32 x10E6/uL (ref 4.14–5.80)
RDW: 12.1 % (ref 11.6–15.4)
WBC: 5.1 10*3/uL (ref 3.4–10.8)

## 2020-05-01 ENCOUNTER — Other Ambulatory Visit: Payer: Self-pay

## 2020-05-01 ENCOUNTER — Ambulatory Visit (INDEPENDENT_AMBULATORY_CARE_PROVIDER_SITE_OTHER): Payer: PPO

## 2020-05-01 DIAGNOSIS — Z7901 Long term (current) use of anticoagulants: Secondary | ICD-10-CM

## 2020-05-01 DIAGNOSIS — Z9889 Other specified postprocedural states: Secondary | ICD-10-CM | POA: Diagnosis not present

## 2020-05-01 LAB — POCT INR: INR: 1.3 — AB (ref 2.0–3.0)

## 2020-05-01 NOTE — Patient Instructions (Addendum)
Take 2 tablets tonight only and then increase to 1.5 tablets Daily except 2 tablets on Monday, Wednesday and Friday.  INR in 1 week    [phone]

## 2020-05-04 ENCOUNTER — Telehealth: Payer: Self-pay

## 2020-05-04 NOTE — Telephone Encounter (Signed)
Lm to r/s t 1030 on same day for coumadin

## 2020-05-05 ENCOUNTER — Other Ambulatory Visit: Payer: Self-pay | Admitting: Thoracic Surgery (Cardiothoracic Vascular Surgery)

## 2020-05-05 DIAGNOSIS — Z9889 Other specified postprocedural states: Secondary | ICD-10-CM

## 2020-05-07 ENCOUNTER — Other Ambulatory Visit: Payer: Self-pay | Admitting: Physician Assistant

## 2020-05-08 ENCOUNTER — Ambulatory Visit (INDEPENDENT_AMBULATORY_CARE_PROVIDER_SITE_OTHER): Payer: Self-pay | Admitting: Thoracic Surgery (Cardiothoracic Vascular Surgery)

## 2020-05-08 ENCOUNTER — Encounter: Payer: Self-pay | Admitting: Thoracic Surgery (Cardiothoracic Vascular Surgery)

## 2020-05-08 ENCOUNTER — Ambulatory Visit (INDEPENDENT_AMBULATORY_CARE_PROVIDER_SITE_OTHER): Payer: PPO

## 2020-05-08 ENCOUNTER — Other Ambulatory Visit: Payer: Self-pay

## 2020-05-08 ENCOUNTER — Ambulatory Visit
Admission: RE | Admit: 2020-05-08 | Discharge: 2020-05-08 | Disposition: A | Discharge status: Home / Self Care | Payer: PPO | Source: Ambulatory Visit | Attending: Thoracic Surgery (Cardiothoracic Vascular Surgery) | Admitting: Thoracic Surgery (Cardiothoracic Vascular Surgery)

## 2020-05-08 VITALS — BP 128/91 | HR 89 | Resp 20 | Ht 69.0 in | Wt 158.0 lb

## 2020-05-08 DIAGNOSIS — Z7901 Long term (current) use of anticoagulants: Secondary | ICD-10-CM

## 2020-05-08 DIAGNOSIS — Z9889 Other specified postprocedural states: Secondary | ICD-10-CM

## 2020-05-08 DIAGNOSIS — R079 Chest pain, unspecified: Secondary | ICD-10-CM | POA: Diagnosis not present

## 2020-05-08 LAB — POCT INR: INR: 1.5 — AB (ref 2.0–3.0)

## 2020-05-08 NOTE — Patient Instructions (Signed)
Continue all previous medications without any changes at this time  You may continue to gradually increase your physical activity as tolerated.  Refrain from any heavy lifting or strenuous use of your arms and shoulders until at least 8 weeks from the time of your surgery, and avoid activities that cause increased pain in your chest on the side of your surgical incision.  Otherwise you may continue to increase activities without any particular limitations.  Increase the intensity and duration of physical activity gradually.  You may return to driving an automobile as long as you are no longer requiring oral narcotic pain relievers during the daytime.  It would be wise to start driving only short distances during the daylight and gradually increase from there as you feel comfortable.  You are encouraged to enroll and participate in the outpatient cardiac rehab program beginning as soon as practical.  Endocarditis is a potentially serious infection of heart valves or inside lining of the heart.  It occurs more commonly in patients with diseased heart valves (such as patient's with aortic or mitral valve disease) and in patients who have undergone heart valve repair or replacement.  Certain surgical and dental procedures may put you at risk, such as dental cleaning, other dental procedures, or any surgery involving the respiratory, urinary, gastrointestinal tract, gallbladder or prostate gland.   To minimize your chances for develooping endocarditis, maintain good oral health and seek prompt medical attention for any infections involving the mouth, teeth, gums, skin or urinary tract.    Always notify your doctor or dentist about your underlying heart valve condition before having any invasive procedures. You will need to take antibiotics before certain procedures, including all routine dental cleanings or other dental procedures.  Your cardiologist or dentist should prescribe these antibiotics for you to  be taken ahead of time.

## 2020-05-08 NOTE — Patient Instructions (Signed)
Take 3 tablets tonight only and then increase to 2 tablets Daily except 1.5 tablets on Wednesday.  INR in 1 week    [phone]

## 2020-05-08 NOTE — Progress Notes (Signed)
[address]       [place] [address]             [phone]     CARDIOTHORACIC SURGERY OFFICE NOTE  Referring Provider is [name], Tyler Freer, MD PCP is Tyler Solian, MD   HPI:  Patient is 70 year old gentleman with history of hypertension and depression who returns to the office today for routine follow-up status post minimally invasive mitral valve repair on April 11, 2020 for mitral valve prolapse with severe asymptomatic primary mitral regurgitation.  His early recovery in the hospital was uneventful and he was discharged home on the fourth postoperative day.  Since then he has been seen in follow-up by Tyler Sims, NP at [facility] and his Coumadin dose has been adjusted and monitored through the Coumadin clinic.  He returns her office today and reports that he is slowly improving.  He still has some soreness around his surgical incision.  He has been taking oral pain relievers at night to help him rest.  He has not had any shortness of breath.  Appetite has been slow to improve but he states that he is eating enough.  He has no shortness of breath.  He has not been walking much.  The remainder of his review of systems is unremarkable.   Current Outpatient Medications  Medication Sig Dispense Refill  . buPROPion (WELLBUTRIN XL) 150 MG 24 hr tablet Take 150 mg by mouth in the morning.     . dicyclomine (BENTYL) 10 MG capsule TAKE 1 CAPSULE BY MOUTH 3 TIMES DAILY AS NEEDED FOR SPASMS (ABD PAIN AND CRAMPING). (Patient taking differently: Take 10 mg by mouth daily as needed for spasms (or abdominal pain/cramping).) 90 capsule 3  . escitalopram (LEXAPRO) 5 MG tablet Take 5 mg by mouth every evening.     . ferrous sulfate 325 (65 FE) MG tablet Take 1 tablet (325 mg total) by mouth daily with breakfast. For one month then stop. If develops constipation, may take softener or laxative or stop medication 30 tablet 0  . hydroxypropyl methylcellulose /  hypromellose (ISOPTO TEARS / GONIOVISC) 2.5 % ophthalmic solution Place 1 drop into both eyes 3 (three) times daily as needed (for dryness).     . metoprolol succinate (TOPROL XL) 25 MG 24 hr tablet Take 0.5 tablets (12.5 mg total) by mouth daily. 45 tablet 3  . oxyCODONE (OXY IR/ROXICODONE) 5 MG immediate release tablet Take 1 tablet (5 mg total) by mouth every 4 (four) hours as needed for severe pain. 30 tablet 0  . pantoprazole (PROTONIX) 40 MG tablet TAKE 1 TABLET BY MOUTH EVERY DAY (Patient taking differently: Take 40 mg by mouth daily before breakfast.) 90 tablet 0  . sildenafil (VIAGRA) 100 MG tablet Take 100 mg by mouth daily as needed for erectile dysfunction.    . traZODone (DESYREL) 50 MG tablet Take 50 mg by mouth at bedtime.    Marland Kitchen warfarin (COUMADIN) 2.5 MG tablet Take 1 tablet (2.5 mg total) by mouth daily at 4 PM. Or as directed. 30 tablet 1   No current facility-administered medications for this visit.      Physical Exam:   BP (!) 128/91 (BP Location: Right Arm, Patient Position: Sitting)   Pulse 89   Resp 20   Ht 5\' 9"  (1.753 m)   Wt 158 lb (71.7 kg)   SpO2 98% Comment: RA  BMI 23.33 kg/m   General:  Well-appearing  Chest:  Clear to auscultation with symmetrical breath sounds  CV:   Regular rate and rhythm without murmur  Incisions:  Healing nicely  Abdomen:  Soft nontender  Extremities:  Warm and well-perfused  Diagnostic Tests:  CHEST - 2 VIEW  COMPARISON:  04/15/2020 and CT chest 01/12/2020.  FINDINGS: Trachea is midline. Heart size normal. Improving aeration in the right lung base with residual streaky opacification in the right perihilar region. Left lung is clear. No pleural fluid. No pneumothorax.  IMPRESSION: Mild residual subsegmental atelectasis in the right perihilar region.   Electronically Signed   By: Tyler [name] M.D.   On: 05/08/2020 11:01    Impression:  Patient is doing well just under 1 month status post minimally  invasive mitral valve repair  Plan:  We have not recommended any changes to patient's current medications.  I have encouraged him to continue to gradually increase his physical activity as tolerated.  We have specifically discussed about the fact that he needs to try to walk more and I have encouraged him to walk at least twice daily for 15 minutes duration.  I think he would benefit from participating in the cardiac rehab program.  The patient has been reminded regarding the importance of dental hygiene and the lifelong need for antibiotic prophylaxis for all dental cleanings and other related invasive procedures.  The patient will return to our office in approximately 6 weeks for routine follow-up.  All of his questions have been addressed.     Tyler Gu. Roxy Manns, MD 05/08/2020 12:31 PM

## 2020-05-09 ENCOUNTER — Other Ambulatory Visit: Payer: Self-pay | Admitting: Physician Assistant

## 2020-05-15 ENCOUNTER — Other Ambulatory Visit: Payer: Self-pay

## 2020-05-15 ENCOUNTER — Ambulatory Visit (INDEPENDENT_AMBULATORY_CARE_PROVIDER_SITE_OTHER): Payer: PPO

## 2020-05-15 DIAGNOSIS — Z7901 Long term (current) use of anticoagulants: Secondary | ICD-10-CM

## 2020-05-15 DIAGNOSIS — Z9889 Other specified postprocedural states: Secondary | ICD-10-CM | POA: Diagnosis not present

## 2020-05-15 LAB — POCT INR: INR: 2.2 (ref 2.0–3.0)

## 2020-05-15 NOTE — Patient Instructions (Signed)
Continue taking 2 tablets Daily except 1.5 tablets on Wednesday.  INR in 2 weeks    [phone]

## 2020-05-16 ENCOUNTER — Other Ambulatory Visit: Payer: Self-pay

## 2020-05-16 MED ORDER — WARFARIN SODIUM 2.5 MG PO TABS: ORAL_TABLET | ORAL | 1 refills | Status: DC

## 2020-05-20 ENCOUNTER — Other Ambulatory Visit: Payer: Self-pay | Admitting: Physician Assistant

## 2020-05-25 ENCOUNTER — Other Ambulatory Visit: Payer: Self-pay

## 2020-05-25 ENCOUNTER — Ambulatory Visit (HOSPITAL_COMMUNITY): Payer: PPO | Attending: Cardiovascular Disease

## 2020-05-25 ENCOUNTER — Ambulatory Visit (INDEPENDENT_AMBULATORY_CARE_PROVIDER_SITE_OTHER): Payer: PPO | Admitting: *Deleted

## 2020-05-25 DIAGNOSIS — Z5181 Encounter for therapeutic drug level monitoring: Secondary | ICD-10-CM

## 2020-05-25 DIAGNOSIS — Z9889 Other specified postprocedural states: Secondary | ICD-10-CM

## 2020-05-25 DIAGNOSIS — C61 Malignant neoplasm of prostate: Secondary | ICD-10-CM | POA: Diagnosis not present

## 2020-05-25 DIAGNOSIS — I340 Nonrheumatic mitral (valve) insufficiency: Secondary | ICD-10-CM | POA: Diagnosis not present

## 2020-05-25 LAB — ECHOCARDIOGRAM COMPLETE
Area-P 1/2: 2.89 cm2
S' Lateral: 3.8 cm
Single Plane A2C EF: 33.5 %

## 2020-05-25 LAB — POCT INR: INR: 2.3 (ref 2.0–3.0)

## 2020-05-25 NOTE — Patient Instructions (Signed)
Description   Continue taking 2 tablets Daily except 1.5 tablets on Wednesday.  INR in 3 weeks    [phone]

## 2020-06-01 DIAGNOSIS — Z8546 Personal history of malignant neoplasm of prostate: Secondary | ICD-10-CM | POA: Diagnosis not present

## 2020-06-01 DIAGNOSIS — N5201 Erectile dysfunction due to arterial insufficiency: Secondary | ICD-10-CM | POA: Diagnosis not present

## 2020-06-08 ENCOUNTER — Telehealth: Payer: Self-pay

## 2020-06-08 ENCOUNTER — Encounter: Payer: Self-pay | Admitting: Thoracic Surgery (Cardiothoracic Vascular Surgery)

## 2020-06-08 MED ORDER — WARFARIN SODIUM 2.5 MG PO TABS: ORAL_TABLET | ORAL | 0 refills | Status: DC

## 2020-06-08 NOTE — Telephone Encounter (Signed)
rx sent

## 2020-06-16 ENCOUNTER — Ambulatory Visit (INDEPENDENT_AMBULATORY_CARE_PROVIDER_SITE_OTHER): Payer: PPO

## 2020-06-16 ENCOUNTER — Other Ambulatory Visit: Payer: Self-pay

## 2020-06-16 DIAGNOSIS — Z7901 Long term (current) use of anticoagulants: Secondary | ICD-10-CM | POA: Diagnosis not present

## 2020-06-16 DIAGNOSIS — Z9889 Other specified postprocedural states: Secondary | ICD-10-CM | POA: Diagnosis not present

## 2020-06-16 LAB — POCT INR: INR: 2.2 (ref 2.0–3.0)

## 2020-06-16 NOTE — Patient Instructions (Signed)
Continue taking 2 tablets Daily except 1.5 tablets on Wednesday.  INR in 6 weeks    [phone]

## 2020-06-19 ENCOUNTER — Ambulatory Visit (INDEPENDENT_AMBULATORY_CARE_PROVIDER_SITE_OTHER): Payer: Self-pay | Admitting: Thoracic Surgery (Cardiothoracic Vascular Surgery)

## 2020-06-19 ENCOUNTER — Telehealth (HOSPITAL_COMMUNITY): Payer: Self-pay

## 2020-06-19 ENCOUNTER — Encounter: Payer: Self-pay | Admitting: Thoracic Surgery (Cardiothoracic Vascular Surgery)

## 2020-06-19 ENCOUNTER — Other Ambulatory Visit: Payer: Self-pay

## 2020-06-19 ENCOUNTER — Encounter (HOSPITAL_COMMUNITY): Payer: Self-pay

## 2020-06-19 VITALS — BP 135/85 | HR 84 | Temp 98.1°F | Resp 20 | Wt 155.0 lb

## 2020-06-19 DIAGNOSIS — Z9889 Other specified postprocedural states: Secondary | ICD-10-CM

## 2020-06-19 NOTE — Progress Notes (Signed)
[facility] [facility] [address]       [place] [address]             [phone]     CARDIOTHORACIC SURGERY OFFICE NOTE  Primary Cardiologist is O'Neal, Cassie Freer, MD PCP is Prince Solian, MD   HPI:  Patient is 70 year old gentleman with history of hypertension and depression who returns to the office today for routine follow-up status post minimally invasive mitral valve repair on April 11, 2020 for mitral valve prolapse with severe asymptomatic primary mitral regurgitation.  His early recovery in the hospital was uneventful and he was discharged home on the fourth postoperative day.    He was last seen in our office on May 08, 2020 at which time he was doing well.  He underwent routine follow-up echocardiogram on May 25, 2020 which revealed intact mitral valve repair with no residual mitral regurgitation.  Left ventricular ejection fraction was estimated 40 to 45%.  Patient returns the office today and reports that he is doing very well.  He is walking every day, at least a mile at a time.  He has no exertional shortness of breath.  He no longer has any significant soreness in his chest.  Overall he is pleased with his progress.   Current Outpatient Medications  Medication Sig Dispense Refill  . buPROPion (WELLBUTRIN XL) 150 MG 24 hr tablet Take 150 mg by mouth in the morning.     . dicyclomine (BENTYL) 10 MG capsule TAKE 1 CAPSULE BY MOUTH 3 TIMES DAILY AS NEEDED FOR SPASMS (ABD PAIN AND CRAMPING). (Patient taking differently: Take 10 mg by mouth daily as needed for spasms (or abdominal pain/cramping).) 90 capsule 3  . escitalopram (LEXAPRO) 5 MG tablet Take 5 mg by mouth every evening.     . hydroxypropyl methylcellulose / hypromellose (ISOPTO TEARS / GONIOVISC) 2.5 % ophthalmic solution Place 1 drop into both eyes 3 (three) times daily as needed (for dryness).     . metoprolol succinate (TOPROL XL) 25 MG 24 hr tablet Take 0.5 tablets (12.5 mg total) by mouth daily. 45  tablet 3  . oxyCODONE (OXY IR/ROXICODONE) 5 MG immediate release tablet Take 1 tablet (5 mg total) by mouth every 4 (four) hours as needed for severe pain. 30 tablet 0  . pantoprazole (PROTONIX) 40 MG tablet TAKE 1 TABLET BY MOUTH EVERY DAY (Patient taking differently: Take 40 mg by mouth daily before breakfast.) 90 tablet 0  . sildenafil (VIAGRA) 100 MG tablet Take 100 mg by mouth daily as needed for erectile dysfunction.    . traZODone (DESYREL) 50 MG tablet Take 50 mg by mouth at bedtime.    Marland Kitchen warfarin (COUMADIN) 2.5 MG tablet Take 1 to 2 tablets daily Or as directed by the coumadin clinic. 90 tablet 0   No current facility-administered medications for this visit.      Physical Exam:   BP 135/85 (BP Location: Right Arm, Patient Position: Sitting, Cuff Size: Normal)   Pulse 84   Temp 98.1 F (36.7 C) (Skin)   Resp 20   Wt 155 lb (70.3 kg)   SpO2 96% Comment: RA  BMI 22.89 kg/m   General:  Well-appearing  Chest:   Clear to auscultation  CV:   Regular rate and rhythm without murmur  Incisions:  Completely healed  Abdomen:  Soft nontender  Extremities:  Warm and well perfused  Diagnostic Tests:  ECHOCARDIOGRAM REPORT       Patient Name:  Tyler  [name] Date of Exam: 05/25/2020  Medical Rec #: [id number]   Height:    69.0 in  Accession #:  [id number]  Weight:    158.0 lb  Date of Birth: 1951-01-05   BSA:     1.869 m  Patient Age:  42 years   BP:      123/95 mmHg  Patient Gender: M       HR:      81 bpm.  Exam Location: [facility]   Procedure: 2D Echo, 3D Echo, Cardiac Doppler and Color Doppler   Indications:  Z98.89 s/p MVR    History:    Patient has prior history of Echocardiogram examinations,  most         recent 12/08/2019. Signs/Symptoms:Murmur; Risk         Factors:Hypertension and HLD. S/P minimally invasive  mitral         valve repair 04/11/2020         Complex  valvuloplasty including artificial Gore-tex  neochord         placement x8 with suture plication of posterior commissure  and         30 mm Sorin Memo 3D ring annuloplasty via right mini  thoracotomy         approach.           Mitral Valve: prosthetic annuloplasty ring valve is  present in         the mitral position. Procedure Date: 04/11/2020.    Sonographer:  Marygrace Drought RCS  Referring Phys: [id number] [place]    1. Left ventricular ejection fraction, by estimation, is 40 to 45%. The  left ventricle has mildly decreased function. The left ventricle has no  regional wall motion abnormalities. Left ventricular diastolic function  could not be evaluated.  2. Right ventricular systolic function is normal. The right ventricular  size is normal. There is normal pulmonary artery systolic pressure. The  estimated right ventricular systolic pressure is 44.3 mmHg.  3. Left atrial size was mild to moderately dilated.  4. The mitral valve has been repaired/replaced. No evidence of mitral  valve regurgitation. No evidence of mitral stenosis. The mean mitral valve  gradient is 4.0 mmHg with average heart rate of 80 bpm. There is a  prosthetic annuloplasty ring present in  the mitral position. Procedure Date: 04/11/2020.  5. Tricuspid valve regurgitation is mild to moderate.  6. The aortic valve is normal in structure. Aortic valve regurgitation is  not visualized. No aortic stenosis is present.  7. The inferior vena cava is normal in size with greater than 50%  respiratory variability, suggesting right atrial pressure of 3 mmHg.   Comparison(s): A prior study was performed on 12/08/2019. A successful  mitral annuloplasty/neo-chord repair is seen, without any residual mitral  insufficiency. The left ventricular function is worsened. The left  ventricular size is no longer dilated.   FINDINGS  Left  Ventricle: Left ventricular ejection fraction, by estimation, is 40  to 45%. The left ventricle has mildly decreased function. The left  ventricle has no regional wall motion abnormalities. The left ventricular  internal cavity size was normal in  size. There is no left ventricular hypertrophy. Abnormal (paradoxical)  septal motion consistent with post-operative status. Left ventricular  diastolic function could not be evaluated due to mitral valve repair. Left  ventricular diastolic function could  not be evaluated.   Right Ventricle: The right ventricular size is normal. No increase in  right ventricular wall thickness. Right ventricular systolic function is  normal. There is normal pulmonary artery systolic pressure. The tricuspid  regurgitant velocity is 2.11 m/s, and  with an assumed right atrial pressure of 3 mmHg, the estimated right  ventricular systolic pressure is 84.1 mmHg.   Left Atrium: Left atrial size was mild to moderately dilated.   Right Atrium: Right atrial size was normal in size.   Pericardium: There is no evidence of pericardial effusion.   Mitral Valve: The mitral valve has been repaired/replaced. No evidence of  mitral valve regurgitation. There is a prosthetic annuloplasty ring  present in the mitral position. Procedure Date: 04/11/2020. No evidence of  mitral valve stenosis. MV peak  gradient, 7.4 mmHg. The mean mitral valve gradient is 4.0 mmHg with  average heart rate of 80 bpm.   Tricuspid Valve: The tricuspid valve is normal in structure. Tricuspid  valve regurgitation is mild to moderate. No evidence of tricuspid  stenosis.   Aortic Valve: The aortic valve is normal in structure. Aortic valve  regurgitation is not visualized. No aortic stenosis is present.   Pulmonic Valve: The pulmonic valve was normal in structure. Pulmonic valve  regurgitation is not visualized. No evidence of pulmonic stenosis.   Aorta: The aortic root is normal in size and  structure.   Venous: The inferior vena cava is normal in size with greater than 50%  respiratory variability, suggesting right atrial pressure of 3 mmHg.   IAS/Shunts: No atrial level shunt detected by color flow Doppler.     LEFT VENTRICLE  PLAX 2D  LVIDd:     5.10 cm   Diastology  LVIDs:     3.80 cm   LV e' medial:  7.07 cm/s  LV PW:     0.90 cm   LV E/e' medial: 17.4  LV IVS:    0.90 cm   LV e' lateral:  7.83 cm/s  LVOT diam:   2.43 cm   LV E/e' lateral: 15.7  LV SV:     77  LV SV Index:  41  LVOT Area:   4.64 cm    LV Volumes (MOD)  LV vol d, MOD A2C: 113.3 ml  LV vol s, MOD A2C: 75.4 ml  LV SV MOD A2C:   37.9 ml   RIGHT VENTRICLE  RV Basal diam: 3.50 cm  RV S prime:   9.68 cm/s  TAPSE (M-mode): 1.0 cm  RVSP:      20.8 mmHg   LEFT ATRIUM       Index    RIGHT ATRIUM      Index  LA diam:    4.30 cm 2.30 cm/m RA Pressure: 3.00 mmHg  LA Vol (A2C):  60.7 ml 32.48 ml/m RA Area:   13.80 cm  LA Vol (A4C):  52.5 ml 28.09 ml/m RA Volume:  32.60 ml 17.44 ml/m  LA Biplane Vol: 57.8 ml 30.92 ml/m  AORTIC VALVE  LVOT Vmax:  85.50 cm/s  LVOT Vmean: 60.000 cm/s  LVOT VTI:  0.166 m    AORTA  Ao Root diam: 3.40 cm  Ao Asc diam: 3.20 cm   MITRAL VALVE        TRICUSPID VALVE  MV Area (PHT): 2.89 cm   TR Peak grad:  17.8 mmHg  MV Peak grad: 7.4 mmHg   TR Vmax:    211.00 cm/s  MV Mean grad: 4.0 mmHg   Estimated RAP: 3.00 mmHg  MV Vmax:    1.36 m/s  RVSP:      20.8 mmHg  MV Vmean:   83.4 cm/s  MV Decel Time: 263 msec   SHUNTS  MV E velocity: 123.00 cm/s Systemic VTI: 0.17 m  MV A velocity: 127.00 cm/s Systemic Diam: 2.43 cm  MV E/A ratio: 0.97   Mihai Croitoru MD  Electronically signed by Sanda Klein MD  Signature Date/Time: 05/25/2020/1:38:07 PM      Impression:  Patient is doing very well approximately 2 months status post mitral  valve repair  Plan:  I have encouraged the patient to continue to gradually increase his physical activity without any particular limitations.  We have not recommended any changes to his current medications, but I have suggested that he could stop taking warfarin in approximately 1 month.  The patient has been reminded regarding the importance of dental hygiene and the lifelong need for antibiotic prophylaxis for all dental cleanings and other related invasive procedures.  The patient will continue to follow-up periodically with Dr. Davina Poke and return to our office in the future only should specific problems or questions arise.      Tyler Gu. Roxy Manns, MD 06/19/2020 12:05 PM

## 2020-06-19 NOTE — Telephone Encounter (Signed)
Attempted to call patient in regards to Cardiac Rehab - LM on VM Mailed letter 

## 2020-06-19 NOTE — Patient Instructions (Signed)
You may resume unrestricted physical activity without any particular limitations at this time.  Continue all previous medications without any changes at this time  You may stop taking Coumadin (warfarin) in one month.  Begin taking aspirin 81 mg by mouth daily at that time  Endocarditis is a potentially serious infection of heart valves or inside lining of the heart.  It occurs more commonly in patients with diseased heart valves (such as patient's with aortic or mitral valve disease) and in patients who have undergone heart valve repair or replacement.  Certain surgical and dental procedures may put you at risk, such as dental cleaning, other dental procedures, or any surgery involving the respiratory, urinary, gastrointestinal tract, gallbladder or prostate gland.   To minimize your chances for develooping endocarditis, maintain good oral health and seek prompt medical attention for any infections involving the mouth, teeth, gums, skin or urinary tract.    Always notify your doctor or dentist about your underlying heart valve condition before having any invasive procedures. You will need to take antibiotics before certain procedures, including all routine dental cleanings or other dental procedures.  Your cardiologist or dentist should prescribe these antibiotics for you to be taken ahead of time.

## 2020-06-20 NOTE — Telephone Encounter (Signed)
Patient called back and was interested in participating in the Cardiac Rehab Program. Patient will come in for orientation on 07/13/2020@9 :00am and will attend the 9:15am exercise class.  Tourist information centre manager.

## 2020-06-23 DIAGNOSIS — Z125 Encounter for screening for malignant neoplasm of prostate: Secondary | ICD-10-CM | POA: Diagnosis not present

## 2020-06-23 DIAGNOSIS — R7301 Impaired fasting glucose: Secondary | ICD-10-CM | POA: Diagnosis not present

## 2020-06-23 DIAGNOSIS — E785 Hyperlipidemia, unspecified: Secondary | ICD-10-CM | POA: Diagnosis not present

## 2020-06-23 DIAGNOSIS — R946 Abnormal results of thyroid function studies: Secondary | ICD-10-CM | POA: Diagnosis not present

## 2020-06-26 ENCOUNTER — Other Ambulatory Visit: Payer: Self-pay

## 2020-06-26 MED ORDER — WARFARIN SODIUM 2.5 MG PO TABS: ORAL_TABLET | ORAL | 0 refills | Status: DC

## 2020-06-30 DIAGNOSIS — Z0000 Encounter for general adult medical examination without abnormal findings: Secondary | ICD-10-CM | POA: Diagnosis not present

## 2020-06-30 DIAGNOSIS — F39 Unspecified mood [affective] disorder: Secondary | ICD-10-CM | POA: Diagnosis not present

## 2020-06-30 DIAGNOSIS — Z7901 Long term (current) use of anticoagulants: Secondary | ICD-10-CM | POA: Diagnosis not present

## 2020-06-30 DIAGNOSIS — I10 Essential (primary) hypertension: Secondary | ICD-10-CM | POA: Diagnosis not present

## 2020-06-30 DIAGNOSIS — Z1331 Encounter for screening for depression: Secondary | ICD-10-CM | POA: Diagnosis not present

## 2020-06-30 DIAGNOSIS — Z9889 Other specified postprocedural states: Secondary | ICD-10-CM | POA: Diagnosis not present

## 2020-06-30 DIAGNOSIS — R7301 Impaired fasting glucose: Secondary | ICD-10-CM | POA: Diagnosis not present

## 2020-06-30 DIAGNOSIS — D696 Thrombocytopenia, unspecified: Secondary | ICD-10-CM | POA: Diagnosis not present

## 2020-06-30 DIAGNOSIS — I341 Nonrheumatic mitral (valve) prolapse: Secondary | ICD-10-CM | POA: Diagnosis not present

## 2020-06-30 DIAGNOSIS — E785 Hyperlipidemia, unspecified: Secondary | ICD-10-CM | POA: Diagnosis not present

## 2020-06-30 DIAGNOSIS — D126 Benign neoplasm of colon, unspecified: Secondary | ICD-10-CM | POA: Diagnosis not present

## 2020-06-30 DIAGNOSIS — R251 Tremor, unspecified: Secondary | ICD-10-CM | POA: Diagnosis not present

## 2020-06-30 DIAGNOSIS — R82998 Other abnormal findings in urine: Secondary | ICD-10-CM | POA: Diagnosis not present

## 2020-07-10 ENCOUNTER — Telehealth (HOSPITAL_COMMUNITY): Payer: Self-pay | Admitting: Pharmacy Technician

## 2020-07-10 ENCOUNTER — Other Ambulatory Visit: Payer: Self-pay | Admitting: Adult Health

## 2020-07-11 ENCOUNTER — Telehealth (HOSPITAL_COMMUNITY): Payer: Self-pay

## 2020-07-11 MED ORDER — METOPROLOL SUCCINATE ER 25 MG PO TB24: 0.5000 | ORAL_TABLET | Freq: Every day | ORAL | 3 refills | Status: DC

## 2020-07-11 NOTE — Addendum Note (Signed)
Addended by: Alvin Critchley on: 07/11/2020 03:44 PM   Modules accepted: Orders

## 2020-07-12 ENCOUNTER — Other Ambulatory Visit: Payer: Self-pay

## 2020-07-12 ENCOUNTER — Encounter (HOSPITAL_COMMUNITY)
Admission: RE | Admit: 2020-07-12 | Discharge: 2020-07-12 | Disposition: A | Discharge status: Home / Self Care | Payer: PPO | Source: Ambulatory Visit | Attending: Cardiovascular Disease | Admitting: Cardiovascular Disease

## 2020-07-12 ENCOUNTER — Telehealth (HOSPITAL_COMMUNITY): Payer: Self-pay | Admitting: *Deleted

## 2020-07-12 ENCOUNTER — Encounter (HOSPITAL_COMMUNITY): Payer: Self-pay | Admitting: *Deleted

## 2020-07-12 DIAGNOSIS — Z9889 Other specified postprocedural states: Secondary | ICD-10-CM | POA: Insufficient documentation

## 2020-07-12 NOTE — Telephone Encounter (Signed)
Cardiac Rehab - Pharmacy Resident Documentation   Patient unable to be reached after three call attempts. Please complete allergy verification and medication review during patient's cardiac rehab appointment.    Romilda Garret, PharmD PGY1 Acute Care Pharmacy Resident 07/12/2020 10:29 AM

## 2020-07-12 NOTE — Telephone Encounter (Signed)
Spoke with the patient. Confirmed appointment. Completed health history. Cardiac Rehab Medication Review by a Nurse  Does the patient  feel that his/her medications are working for him/her?  yes  Has the patient been experiencing any side effects to the medications prescribed?  no  Does the patient measure his/her own blood pressure or blood glucose at home?  no   Does the patient have any problems obtaining medications due to transportation or finances?   no  Understanding of regimen: good Understanding of indications: good Potential of compliance: excellent    Nurse  comments: Cosby says that he is taking his medications as prescribed and has a good understanding of his medications. Asaiah says that he is not checking his blood pressures at home on a regular basis. Chun says he does have a BP monitor at home. Abdo reports that he felt lightheaded immediately after surgery but feels better now.     Christa See Jeremiah Curci RN 07/12/2020 2:54 PM

## 2020-07-12 NOTE — Telephone Encounter (Signed)
Pt called stating that he received a VM from someone in cardiac rehab, I reviewed pt chart and advised pt that the pharmacist called wanting to review his medications before his orientation per the note, I advised pt that they will review his medications at his orientation visit.

## 2020-07-13 ENCOUNTER — Encounter (HOSPITAL_COMMUNITY)
Admission: RE | Admit: 2020-07-13 | Discharge: 2020-07-13 | Disposition: A | Discharge status: Home / Self Care | Payer: PPO | Source: Ambulatory Visit | Attending: Cardiovascular Disease | Admitting: Cardiovascular Disease

## 2020-07-13 ENCOUNTER — Encounter (HOSPITAL_COMMUNITY): Payer: Self-pay

## 2020-07-13 VITALS — BP 114/60 | HR 77 | Ht 68.25 in | Wt 155.6 lb

## 2020-07-13 DIAGNOSIS — Z9889 Other specified postprocedural states: Secondary | ICD-10-CM

## 2020-07-13 NOTE — Progress Notes (Signed)
QUALITY OF LIFE SCORE REVIEW  Tyler [name] completed Quality of Life survey as a participant in Cardiac Rehab. Scores 21.0 or below are considered low. Pt score very low in several areas Overall 17.29, Health and Function 17.27, socioeconomic 19.33, physiological and spiritual 14.07, family 19.40. Patient quality of life slightly altered by physical constraints which limits ability to perform as prior to recent cardiac illness. Tyler [name] admits to being currently depressed and has anxiety. Tyler [name] says that his current medications are controlling his depression.  Offered emotional support and reassurance.  Will continue to monitor and intervene as necessary. Tyler [name] says he has dealt with depression for many years even before his recent mitral valve surgery. Will forward quality of life questionnaire questionnaire to Dr Dagmar Hait, Tyler [name]'s primary care physician for review.Barnet Pall, RN,BSN 07/13/2020 2:59 PM

## 2020-07-13 NOTE — Progress Notes (Signed)
Cardiac Individual Treatment Plan  Patient Details  Name: Tyler [name] MRN: [id number] Date of Birth: Dec 05, 1950 Referring Provider:   Flowsheet Row CARDIAC REHAB PHASE II ORIENTATION from 07/13/2020 in [place]  Referring Provider Lake Bells T. Audie Box, MD      Initial Encounter Date:  Flowsheet Row CARDIAC REHAB PHASE II ORIENTATION from 07/13/2020 in [place]  Date 07/13/20      Visit Diagnosis: 04/11/20 S/P mitral valve repair Mimimally Invasive  Patient's Home Medications on Admission:  Current Outpatient Medications:  .  buPROPion (WELLBUTRIN XL) 150 MG 24 hr tablet, Take 150 mg by mouth in the morning. , Disp: , Rfl:  .  dicyclomine (BENTYL) 10 MG capsule, TAKE 1 CAPSULE BY MOUTH 3 TIMES DAILY AS NEEDED FOR SPASMS (ABD PAIN AND CRAMPING). (Patient taking differently: Take 10 mg by mouth daily as needed for spasms (or abdominal pain/cramping).), Disp: 90 capsule, Rfl: 3 .  escitalopram (LEXAPRO) 5 MG tablet, Take 5 mg by mouth every evening. , Disp: , Rfl:  .  hydroxypropyl methylcellulose / hypromellose (ISOPTO TEARS / GONIOVISC) 2.5 % ophthalmic solution, Place 1 drop into both eyes 3 (three) times daily as needed (for dryness). , Disp: , Rfl:  .  metoprolol succinate (TOPROL-XL) 25 MG 24 hr tablet, Take 0.5 tablets (12.5 mg total) by mouth daily. (Patient taking differently: Take 25 mg by mouth daily.), Disp: 45 tablet, Rfl: 3 .  pantoprazole (PROTONIX) 40 MG tablet, TAKE 1 TABLET BY MOUTH EVERY DAY (Patient taking differently: Take 40 mg by mouth daily before breakfast.), Disp: 90 tablet, Rfl: 0 .  sildenafil (VIAGRA) 100 MG tablet, Take 100 mg by mouth daily as needed for erectile dysfunction., Disp: , Rfl:  .  traZODone (DESYREL) 50 MG tablet, Take 50 mg by mouth at bedtime., Disp: , Rfl:  .  warfarin (COUMADIN) 2.5 MG tablet, Take 1 to 2 tablets daily Or as directed by the coumadin clinic., Disp: 90 tablet, Rfl:  0  Past Medical History: Past Medical History:  Diagnosis Date  . Adenomatous polyp of colon 02/2004  . Anemia   . Anxiety   . Arthritis   . BPH (benign prostatic hyperplasia)   . Colonic varices   . Degenerative joint disease   . Depression   . Depression   . Fatty liver   . GERD (gastroesophageal reflux disease)   . Heart murmur   . Hemorrhoids   . Hyperlipidemia   . Hypertension   . IBS (irritable bowel syndrome)   . Prostate cancer ([place]) 2011  . S/P minimally invasive mitral valve repair 04/11/2020   Complex valvuloplasty including artificial Gore-tex neochord placement x8 with suture plication of posterior commissure and 30 mm Sorin Memo 3D ring annuloplasty via right mini thoracotomy approach  . Thrombocytopenia ([place])   . Thyroid nodule     Tobacco Use: Social History   Tobacco Use  Smoking Status Never Smoker  Smokeless Tobacco Never Used    Labs: Recent Review Flowsheet Data    Labs for ITP Cardiac and Pulmonary Rehab Latest Ref Rng & Units 04/11/2020 04/11/2020 04/11/2020 04/11/2020 04/11/2020   Hemoglobin A1c 4.8 - 5.6 % - - - - -   PHART 7.350 - 7.450 - - - 7.413 7.365   PCO2ART 32.0 - 48.0 mmHg - - - 36.4 46.7   HCO3 20.0 - 28.0 mmol/L - - - 23.2 27.1   TCO2 22 - 32 mmol/L 27 25 24 24  29  ACIDBASEDEF 0.0 - 2.0 mmol/L - - - 1.0 -   O2SAT % - - - 100.0 91.0      Capillary Blood Glucose: Lab Results  Component Value Date   GLUCAP 85 04/13/2020   GLUCAP 123 (H) 04/13/2020   GLUCAP 104 (H) 04/12/2020   GLUCAP 140 (H) 04/12/2020   GLUCAP 128 (H) 04/12/2020     Exercise Target Goals: Exercise Program Goal: Individual exercise prescription set using results from initial 6 min walk test and THRR while considering  patient's activity barriers and safety.   Exercise Prescription Goal: Starting with aerobic activity 30 plus minutes a day, 3 days per week for initial exercise prescription. Provide home exercise prescription and guidelines that participant  acknowledges understanding prior to discharge.  Activity Barriers & Risk Stratification:  Activity Barriers & Cardiac Risk Stratification - 07/13/20 1155      Activity Barriers & Cardiac Risk Stratification   Activity Barriers Other (comment)    Comments ocasional dizziness with postion change. Essential tremors    Cardiac Risk Stratification High           6 Minute Walk:  6 Minute Walk    Row Name 07/13/20 0949         6 Minute Walk   Phase Initial     Distance 1400 feet     Walk Time 6 minutes     # of Rest Breaks 0     MPH 2.65     METS 3.19     RPE 9     Perceived Dyspnea  0     VO2 Peak 11.17     Symptoms No     Resting HR 77 bpm     Resting BP 114/60     Resting Oxygen Saturation  96 %     Exercise Oxygen Saturation  during 6 min walk 98 %     Max Ex. HR 90 bpm     Max Ex. BP 132/74     2 Minute Post BP 130/80            Oxygen Initial Assessment:   Oxygen Re-Evaluation:   Oxygen Discharge (Final Oxygen Re-Evaluation):   Initial Exercise Prescription:  Initial Exercise Prescription - 07/13/20 1200      Date of Initial Exercise RX and Referring Provider   Date 07/13/20    Referring Provider Lake Bells T. O'Neal, MD    Expected Discharge Date 09/08/20      NuStep   Level 2    SPM 75    Minutes 30    METs 2.4      Prescription Details   Frequency (times per week) 3    Duration Progress to 30 minutes of continuous aerobic without signs/symptoms of physical distress      Intensity   THRR 40-80% of Max Heartrate 60-119    Ratings of Perceived Exertion 11-13    Perceived Dyspnea 0-4      Progression   Progression Continue progressive overload as per policy without signs/symptoms or physical distress.      Resistance Training   Training Prescription Yes    Weight 4    Reps 10-15           Perform Capillary Blood Glucose checks as needed.  Exercise Prescription Changes:   Exercise Comments:   Exercise Goals and Review:  Exercise  Goals    Row Name 07/13/20 1158             Exercise Goals  Increase Physical Activity Yes       Intervention Provide advice, education, support and counseling about physical activity/exercise needs.;Develop an individualized exercise prescription for aerobic and resistive training based on initial evaluation findings, risk stratification, comorbidities and participant's personal goals.       Expected Outcomes Long Term: Exercising regularly at least 3-5 days a week.;Long Term: Add in home exercise to make exercise part of routine and to increase amount of physical activity.;Short Term: Attend rehab on a regular basis to increase amount of physical activity.       Increase Strength and Stamina Yes       Intervention Provide advice, education, support and counseling about physical activity/exercise needs.;Develop an individualized exercise prescription for aerobic and resistive training based on initial evaluation findings, risk stratification, comorbidities and participant's personal goals.       Expected Outcomes Short Term: Increase workloads from initial exercise prescription for resistance, speed, and METs.;Short Term: Perform resistance training exercises routinely during rehab and add in resistance training at home;Long Term: Improve cardiorespiratory fitness, muscular endurance and strength as measured by increased METs and functional capacity (6MWT)       Able to understand and use rate of perceived exertion (RPE) scale Yes       Intervention Provide education and explanation on how to use RPE scale       Expected Outcomes Short Term: Able to use RPE daily in rehab to express subjective intensity level;Long Term:  Able to use RPE to guide intensity level when exercising independently       Knowledge and understanding of Target Heart Rate Range (THRR) Yes       Intervention Provide education and explanation of THRR including how the numbers were predicted and where they are located for  reference       Expected Outcomes Short Term: Able to use daily as guideline for intensity in rehab;Long Term: Able to use THRR to govern intensity when exercising independently;Short Term: Able to state/look up THRR       Understanding of Exercise Prescription Yes       Intervention Provide education, explanation, and written materials on patient's individual exercise prescription       Expected Outcomes Short Term: Able to explain program exercise prescription;Long Term: Able to explain home exercise prescription to exercise independently              Exercise Goals Re-Evaluation :    Discharge Exercise Prescription (Final Exercise Prescription Changes):   Nutrition:  Target Goals: Understanding of nutrition guidelines, daily intake of sodium 1500mg , cholesterol 200mg , calories 30% from fat and 7% or less from saturated fats, daily to have 5 or more servings of fruits and vegetables.  Biometrics:  Pre Biometrics - 07/13/20 1013      Pre Biometrics   Waist Circumference 35.5 inches    Hip Circumference 38 inches    Waist to Hip Ratio 0.93 %    Triceps Skinfold 11 mm    % Body Fat 22.7 %    Grip Strength 40 kg    Flexibility 13 in    Single Leg Stand 30 seconds            Nutrition Therapy Plan and Nutrition Goals:   Nutrition Assessments:  MEDIFICTS Score Key:  ?70 Need to make dietary changes   40-70 Heart Healthy Diet  ? 40 Therapeutic Level Cholesterol Diet   Picture Your Plate Scores:  [id number] Unhealthy dietary pattern with much room for improvement.  41-50  Dietary pattern unlikely to meet recommendations for good health and room for improvement.  51-60 More healthful dietary pattern, with some room for improvement.   >60 Healthy dietary pattern, although there may be some specific behaviors that could be improved.    Nutrition Goals Re-Evaluation:   Nutrition Goals Discharge (Final Nutrition Goals Re-Evaluation):   Psychosocial: Target Goals:  Acknowledge presence or absence of significant depression and/or stress, maximize coping skills, provide positive support system. Participant is able to verbalize types and ability to use techniques and skills needed for reducing stress and depression.  Initial Review & Psychosocial Screening:  Initial Psych Review & Screening - 07/13/20 1321      Initial Review   Current issues with Current Depression;Current Anxiety/Panic;History of Depression;Current Stress Concerns    Source of Stress Concerns Chronic Illness;Family    Comments Tyler [name] reports having a long history of depression and anxiety      Family Dynamics   Good Support System? Yes   Tyler [name] says that he has his wife for support   Comments Tyler [name] admits to being an introvert.  Tyler [name] says that his current antidepressant medication is working for him.      Barriers   Psychosocial barriers to participate in program The patient should benefit from training in stress management and relaxation.      Screening Interventions   Interventions Encouraged to exercise;To provide support and resources with identified psychosocial needs;Provide feedback about the scores to participant    Expected Outcomes Short Term goal: Utilizing psychosocial counselor, staff and physician to assist with identification of specific Stressors or current issues interfering with healing process. Setting desired goal for each stressor or current issue identified.;Long Term Goal: Stressors or current issues are controlled or eliminated.;Short Term goal: Identification and review with participant of any Quality of Life or Depression concerns found by scoring the questionnaire.;Long Term goal: The participant improves quality of Life and PHQ9 Scores as seen by post scores and/or verbalization of changes           Quality of Life Scores:  Quality of Life - 07/13/20 1149      Quality of Life   Select Quality of Life      Quality of Life Scores   Health/Function Pre 17.27 %     Socioeconomic Pre 19.33 %    Psych/Spiritual Pre 14.07 %    Family Pre 19.4 %    GLOBAL Pre 17.29 %          Scores of 19 and below usually indicate a poorer quality of life in these areas.  A difference of  2-3 points is a clinically meaningful difference.  A difference of 2-3 points in the total score of the Quality of Life Index has been associated with significant improvement in overall quality of life, self-image, physical symptoms, and general health in studies assessing change in quality of life.  PHQ-9: Recent Review Flowsheet Data    Depression screen [facility] 2/9 07/13/2020   Decreased Interest 3   Down, Depressed, Hopeless 1   PHQ - 2 Score 4   Altered sleeping 0   Tired, decreased energy 3   Change in appetite 1   Feeling bad or failure about yourself  2   Trouble concentrating 0   Moving slowly or fidgety/restless 0   Suicidal thoughts 0   PHQ-9 Score 10   Difficult doing work/chores Somewhat difficult     Interpretation of Total Score  Total Score Depression Severity:  1-4 = Minimal  depression, 5-9 = Mild depression, 10-14 = Moderate depression, 15-19 = Moderately severe depression, 20-27 = Severe depression   Psychosocial Evaluation and Intervention:   Psychosocial Re-Evaluation:   Psychosocial Discharge (Final Psychosocial Re-Evaluation):   Vocational Rehabilitation: Provide vocational rehab assistance to qualifying candidates.   Vocational Rehab Evaluation & Intervention:  Vocational Rehab - 07/13/20 1351      Initial Vocational Rehab Evaluation & Intervention   Assessment shows need for Vocational Rehabilitation No   Tyler [name] is retired and does not need vocational rehab at this time          Education: Education Goals: Education classes will be provided on a weekly basis, covering required topics. Participant will state understanding/return demonstration of topics presented.  Learning Barriers/Preferences:  Learning Barriers/Preferences - 07/13/20  1151      Learning Barriers/Preferences   Learning Barriers Sight   wears glasses, hearing aids,   Learning Preferences Written Material;Pictoral;Computer/Internet;Video           Education Topics: Hypertension, Hypertension Reduction -Define heart disease and high blood pressure. Discus how high blood pressure affects the body and ways to reduce high blood pressure.   Exercise and Your Heart -Discuss why it is important to exercise, the FITT principles of exercise, normal and abnormal responses to exercise, and how to exercise safely.   Angina -Discuss definition of angina, causes of angina, treatment of angina, and how to decrease risk of having angina.   Cardiac Medications -Review what the following cardiac medications are used for, how they affect the body, and side effects that may occur when taking the medications.  Medications include Aspirin, Beta blockers, calcium channel blockers, ACE Inhibitors, angiotensin receptor blockers, diuretics, digoxin, and antihyperlipidemics.   Congestive Heart Failure -Discuss the definition of CHF, how to live with CHF, the signs and symptoms of CHF, and how keep track of weight and sodium intake.   Heart Disease and Intimacy -Discus the effect sexual activity has on the heart, how changes occur during intimacy as we age, and safety during sexual activity.   Smoking Cessation / COPD -Discuss different methods to quit smoking, the health benefits of quitting smoking, and the definition of COPD.   Nutrition I: Fats -Discuss the types of cholesterol, what cholesterol does to the heart, and how cholesterol levels can be controlled.   Nutrition II: Labels -Discuss the different components of food labels and how to read food label   Heart Parts/Heart Disease and PAD -Discuss the anatomy of the heart, the pathway of blood circulation through the heart, and these are affected by heart disease.   Stress I: Signs and  Symptoms -Discuss the causes of stress, how stress may lead to anxiety and depression, and ways to limit stress.   Stress II: Relaxation -Discuss different types of relaxation techniques to limit stress.   Warning Signs of Stroke / TIA -Discuss definition of a stroke, what the signs and symptoms are of a stroke, and how to identify when someone is having stroke.   Knowledge Questionnaire Score:  Knowledge Questionnaire Score - 07/13/20 1150      Knowledge Questionnaire Score   Pre Score 19/24           Core Components/Risk Factors/Patient Goals at Admission:  Personal Goals and Risk Factors at Admission - 07/13/20 1150      Core Components/Risk Factors/Patient Goals on Admission    Weight Management Weight Maintenance;Yes    Intervention Weight Management: Develop a combined nutrition and exercise program designed to reach desired  caloric intake, while maintaining appropriate intake of nutrient and fiber, sodium and fats, and appropriate energy expenditure required for the weight goal.;Weight Management: Provide education and appropriate resources to help participant work on and attain dietary goals.    Expected Outcomes Short Term: Continue to assess and modify interventions until short term weight is achieved;Long Term: Adherence to nutrition and physical activity/exercise program aimed toward attainment of established weight goal;Weight Maintenance: Understanding of the daily nutrition guidelines, which includes 25-35% calories from fat, 7% or less cal from saturated fats, less than 200mg  cholesterol, less than 1.5gm of sodium, & 5 or more servings of fruits and vegetables daily;Understanding recommendations for meals to include 15-35% energy as protein, 25-35% energy from fat, 35-60% energy from carbohydrates, less than 200mg  of dietary cholesterol, 20-35 gm of total fiber daily    Hypertension Yes    Intervention Provide education on lifestyle modifcations including regular  physical activity/exercise, weight management, moderate sodium restriction and increased consumption of fresh fruit, vegetables, and low fat dairy, alcohol moderation, and smoking cessation.;Monitor prescription use compliance.    Expected Outcomes Short Term: Continued assessment and intervention until BP is < 140/2mm HG in hypertensive participants. < 130/63mm HG in hypertensive participants with diabetes, heart failure or chronic kidney disease.;Long Term: Maintenance of blood pressure at goal levels.           Core Components/Risk Factors/Patient Goals Review:    Core Components/Risk Factors/Patient Goals at Discharge (Final Review):    ITP Comments:  ITP Comments    Row Name 07/13/20 0849           ITP Comments Dr Fransico Him MD, Medical Director              Comments: Tyler [name]  attended orientation on 07/13/2020 to review rules and guidelines for program.  Completed 6 minute walk test, Intitial ITP, and exercise prescription.  VSS. Telemetry-Sinus Rhythm.  Asymptomatic. Safety measures and social distancing in place per CDC guidelines.Barnet Pall, RN,BSN 07/13/2020 2:01 PM

## 2020-07-17 ENCOUNTER — Other Ambulatory Visit: Payer: Self-pay

## 2020-07-17 ENCOUNTER — Encounter (HOSPITAL_COMMUNITY)
Admission: RE | Admit: 2020-07-17 | Discharge: 2020-07-17 | Disposition: A | Discharge status: Home / Self Care | Payer: PPO | Source: Ambulatory Visit | Attending: Cardiovascular Disease | Admitting: Cardiovascular Disease

## 2020-07-17 DIAGNOSIS — Z9889 Other specified postprocedural states: Secondary | ICD-10-CM

## 2020-07-17 NOTE — Progress Notes (Signed)
Daily Session Note  Patient Details  Name: Tyler [name]: [id number] Date of Birth: Mar 27, 1950 Referring Provider:   Flowsheet Row CARDIAC REHAB PHASE II ORIENTATION from 07/13/2020 in [place]  Referring Provider Tyler Bells T. Audie Box, MD      Encounter Date: 07/17/2020  Check In:  Session Check In - 07/17/20 0905      Check-In   Supervising physician immediately available to respond to emergencies [facility] immediately available    Physician(s) Dr Lonny Prude    Location [facility]    Staff Present Barnet Pall, RN, Milus Glazier, MS, ACSM-CEP, CCRP, Exercise Physiologist;Olinty Celesta Aver, MS, ACSM CEP, Exercise Physiologist;Annedrea Rosezella [place], RN, MHA;Carlette Wilber Oliphant, RN, BSN    Virtual Visit No    Medication changes reported     No    Fall or balance concerns reported    No    Tobacco Cessation No Change    Current number of cigarettes/nicotine per day     0    Warm-up and Cool-down Performed on first and last piece of equipment    Resistance Training Performed Yes    VAD Patient? No    PAD/SET Patient? No      Pain Assessment   Currently in Pain? No/denies    Pain Score 0-No pain    Multiple Pain Sites No           Capillary Blood Glucose: No results found for this or any previous visit (from the past 24 hour(s)).   Exercise Prescription Changes - 07/17/20 1000      Response to Exercise   Blood Pressure (Admit) 112/80    Blood Pressure (Exercise) 114/78    Blood Pressure (Exit) 118/82    Heart Rate (Admit) 79 bpm    Heart Rate (Exercise) 86 bpm    Heart Rate (Exit) 75 bpm    Rating of Perceived Exertion (Exercise) 11    Symptoms None    Comments Pt's first day in the CRP2 program    Duration Progress to 30 minutes of  aerobic without signs/symptoms of physical distress    Intensity THRR unchanged      Progression   Progression Continue to progress workloads to maintain intensity without  signs/symptoms of physical distress.    Average METs 1.7      Resistance Training   Training Prescription Yes    Weight 4    Reps 10-15    Time 10 Minutes      Interval Training   Interval Training No      T5 Nustep   Level 2    Minutes 30    METs 1.7           Social History   Tobacco Use  Smoking Status Never Smoker  Smokeless Tobacco Never Used    Goals Met:  Exercise tolerated well No report of cardiac concerns or symptoms Strength training completed today  Goals Unmet:  Not Applicable  Comments: Tyler [name] started cardiac rehab today.  Pt tolerated light exercise without difficulty. VSS, telemetry-Sinus Rhythm PAC's, asymptomatic.  Medication list reconciled. Pt denies barriers to medicaiton compliance.  PSYCHOSOCIAL ASSESSMENT:  PHQ-10. Pt has along history of depression and is currently on medication for depression. Tyler [name] says that his depression is currently controlled.  Tyler [name]'s quality of life questionnaire was faxed to Dr Danna Hefty for review.    Pt enjoys photography .   Pt oriented to exercise equipment and routine.    Understanding verbalized.Barnet Pall, RN,BSN 07/17/2020  10:46 AM   Dr. Fransico Him is Medical Director for Cardiac Rehab at [facility].

## 2020-07-19 ENCOUNTER — Encounter (HOSPITAL_COMMUNITY)
Admission: RE | Admit: 2020-07-19 | Discharge: 2020-07-19 | Disposition: A | Discharge status: Home / Self Care | Payer: PPO | Source: Ambulatory Visit | Attending: Cardiovascular Disease | Admitting: Cardiovascular Disease

## 2020-07-19 ENCOUNTER — Other Ambulatory Visit: Payer: Self-pay

## 2020-07-19 DIAGNOSIS — Z9889 Other specified postprocedural states: Secondary | ICD-10-CM

## 2020-07-19 NOTE — Progress Notes (Signed)
Tyler [name] 70 y.o. male Nutrition Note  Diagnosis: MVR   Past Medical History:  Diagnosis Date  . Adenomatous polyp of colon 02/2004  . Anemia   . Anxiety   . Arthritis   . BPH (benign prostatic hyperplasia)   . Colonic varices   . Degenerative joint disease   . Depression   . Depression   . Fatty liver   . GERD (gastroesophageal reflux disease)   . Heart murmur   . Hemorrhoids   . Hyperlipidemia   . Hypertension   . IBS (irritable bowel syndrome)   . Prostate cancer ([place]) 2011  . S/P minimally invasive mitral valve repair 04/11/2020   Complex valvuloplasty including artificial Gore-tex neochord placement x8 with suture plication of posterior commissure and 30 mm Sorin Memo 3D ring annuloplasty via right mini thoracotomy approach  . Thrombocytopenia ([place])   . Thyroid nodule      Medications reviewed.   Current Outpatient Medications:  .  buPROPion (WELLBUTRIN XL) 150 MG 24 hr tablet, Take 150 mg by mouth in the morning. , Disp: , Rfl:  .  dicyclomine (BENTYL) 10 MG capsule, TAKE 1 CAPSULE BY MOUTH 3 TIMES DAILY AS NEEDED FOR SPASMS (ABD PAIN AND CRAMPING). (Patient taking differently: Take 10 mg by mouth daily as needed for spasms (or abdominal pain/cramping).), Disp: 90 capsule, Rfl: 3 .  escitalopram (LEXAPRO) 5 MG tablet, Take 5 mg by mouth every evening. , Disp: , Rfl:  .  hydroxypropyl methylcellulose / hypromellose (ISOPTO TEARS / GONIOVISC) 2.5 % ophthalmic solution, Place 1 drop into both eyes 3 (three) times daily as needed (for dryness). , Disp: , Rfl:  .  metoprolol succinate (TOPROL-XL) 25 MG 24 hr tablet, Take 0.5 tablets (12.5 mg total) by mouth daily. (Patient taking differently: Take 25 mg by mouth daily.), Disp: 45 tablet, Rfl: 3 .  pantoprazole (PROTONIX) 40 MG tablet, TAKE 1 TABLET BY MOUTH EVERY DAY (Patient taking differently: Take 40 mg by mouth daily before breakfast.), Disp: 90 tablet, Rfl: 0 .  sildenafil (VIAGRA) 100 MG tablet, Take 100 mg by mouth  daily as needed for erectile dysfunction., Disp: , Rfl:  .  traZODone (DESYREL) 50 MG tablet, Take 50 mg by mouth at bedtime., Disp: , Rfl:  .  warfarin (COUMADIN) 2.5 MG tablet, Take 1 to 2 tablets daily Or as directed by the coumadin clinic., Disp: 90 tablet, Rfl: 0   Ht Readings from Last 1 Encounters:  07/13/20 5' 8.25" (1.734 m)     Wt Readings from Last 3 Encounters:  07/13/20 155 lb 10.3 oz (70.6 kg)  06/19/20 155 lb (70.3 kg)  05/08/20 158 lb (71.7 kg)     There is no height or weight on file to calculate BMI.   Social History   Tobacco Use  Smoking Status Never Smoker  Smokeless Tobacco Never Used     No results found for: CHOL No results found for: HDL No results found for: LDLCALC No results found for: TRIG   Lab Results  Component Value Date   HGBA1C 5.4 04/07/2020     CBG (last 3)  No results for input(s): GLUCAP in the last 72 hours.   Nutrition Note  Spoke with pt. Nutrition Plan and Nutrition Survey goals reviewed with pt. Discussed healthy eating pattern and areas for improvement.  Pt states his wife cooks and he eats what she prepares.  Assessed sodium intake. He does not use salt shaker. He does not read labels. He eats  restaurant food 3 times per week. Reviewed sodium recommendations and label reading. Healthy fats discussed.  Reviewed healthy snack options.  Pt expressed understanding of the information reviewed.    Nutrition Diagnosis ? Food-and nutrition-related knowledge deficit related to lack of exposure to information as related to diagnosis of: ? CVD ?   Nutrition Intervention ? Pt's individual nutrition plan reviewed with pt. ? Benefits of adopting Heart Healthy diet discussed when Picture Your Plate reviewed. ? Continue client-centered nutrition education by RD, as part of interdisciplinary care.  Goal(s)  ? Pt to build a healthy plate including vegetables, fruits, whole grains, and low-fat dairy products in a heart healthy meal  plan. ?  Pt to reduce sodium intake <2300 mg/day   Plan:   Will provide client-centered nutrition education as part of interdisciplinary care  Monitor and evaluate progress toward nutrition goal with team.   Michaele Offer, MS, RDN, LDN

## 2020-07-21 ENCOUNTER — Other Ambulatory Visit: Payer: Self-pay

## 2020-07-21 ENCOUNTER — Encounter (HOSPITAL_COMMUNITY)
Admission: RE | Admit: 2020-07-21 | Discharge: 2020-07-21 | Disposition: A | Discharge status: Home / Self Care | Payer: PPO | Source: Ambulatory Visit | Attending: Cardiovascular Disease | Admitting: Cardiovascular Disease

## 2020-07-21 DIAGNOSIS — Z9889 Other specified postprocedural states: Secondary | ICD-10-CM

## 2020-07-24 ENCOUNTER — Encounter (HOSPITAL_COMMUNITY)
Admission: RE | Admit: 2020-07-24 | Discharge: 2020-07-24 | Disposition: A | Discharge status: Home / Self Care | Payer: PPO | Source: Ambulatory Visit | Attending: Cardiovascular Disease | Admitting: Cardiovascular Disease

## 2020-07-24 ENCOUNTER — Other Ambulatory Visit: Payer: Self-pay

## 2020-07-24 DIAGNOSIS — Z9889 Other specified postprocedural states: Secondary | ICD-10-CM

## 2020-07-25 NOTE — Progress Notes (Signed)
Cardiac Individual Treatment Plan  Patient Details  Name: Tyler [name] MRN: [id number] Date of Birth: Dec 05, 1950 Referring Provider:   Flowsheet Row CARDIAC REHAB PHASE II ORIENTATION from 07/13/2020 in [facility]  Referring Provider Lake Bells T. Audie Box, MD      Initial Encounter Date:  Flowsheet Row CARDIAC REHAB PHASE II ORIENTATION from 07/13/2020 in [facility]  Date 07/13/20      Visit Diagnosis: 04/11/20 S/P mitral valve repair Mimimally Invasive  Patient's Home Medications on Admission:  Current Outpatient Medications:  .  buPROPion (WELLBUTRIN XL) 150 MG 24 hr tablet, Take 150 mg by mouth in the morning. , Disp: , Rfl:  .  dicyclomine (BENTYL) 10 MG capsule, TAKE 1 CAPSULE BY MOUTH 3 TIMES DAILY AS NEEDED FOR SPASMS (ABD PAIN AND CRAMPING). (Patient taking differently: Take 10 mg by mouth daily as needed for spasms (or abdominal pain/cramping).), Disp: 90 capsule, Rfl: 3 .  escitalopram (LEXAPRO) 5 MG tablet, Take 5 mg by mouth every evening. , Disp: , Rfl:  .  hydroxypropyl methylcellulose / hypromellose (ISOPTO TEARS / GONIOVISC) 2.5 % ophthalmic solution, Place 1 drop into both eyes 3 (three) times daily as needed (for dryness). , Disp: , Rfl:  .  metoprolol succinate (TOPROL-XL) 25 MG 24 hr tablet, Take 0.5 tablets (12.5 mg total) by mouth daily. (Patient taking differently: Take 25 mg by mouth daily.), Disp: 45 tablet, Rfl: 3 .  pantoprazole (PROTONIX) 40 MG tablet, TAKE 1 TABLET BY MOUTH EVERY DAY (Patient taking differently: Take 40 mg by mouth daily before breakfast.), Disp: 90 tablet, Rfl: 0 .  sildenafil (VIAGRA) 100 MG tablet, Take 100 mg by mouth daily as needed for erectile dysfunction., Disp: , Rfl:  .  traZODone (DESYREL) 50 MG tablet, Take 50 mg by mouth at bedtime., Disp: , Rfl:  .  warfarin (COUMADIN) 2.5 MG tablet, Take 1 to 2 tablets daily Or as directed by the coumadin clinic., Disp: 90 tablet, Rfl:  0  Past Medical History: Past Medical History:  Diagnosis Date  . Adenomatous polyp of colon 02/2004  . Anemia   . Anxiety   . Arthritis   . BPH (benign prostatic hyperplasia)   . Colonic varices   . Degenerative joint disease   . Depression   . Depression   . Fatty liver   . GERD (gastroesophageal reflux disease)   . Heart murmur   . Hemorrhoids   . Hyperlipidemia   . Hypertension   . IBS (irritable bowel syndrome)   . Prostate cancer ([place]) 2011  . S/P minimally invasive mitral valve repair 04/11/2020   Complex valvuloplasty including artificial Gore-tex neochord placement x8 with suture plication of posterior commissure and 30 mm Sorin Memo 3D ring annuloplasty via right mini thoracotomy approach  . Thrombocytopenia ([place])   . Thyroid nodule     Tobacco Use: Social History   Tobacco Use  Smoking Status Never Smoker  Smokeless Tobacco Never Used    Labs: Recent Review Flowsheet Data    Labs for ITP Cardiac and Pulmonary Rehab Latest Ref Rng & Units 04/11/2020 04/11/2020 04/11/2020 04/11/2020 04/11/2020   Hemoglobin A1c 4.8 - 5.6 % - - - - -   PHART 7.350 - 7.450 - - - 7.413 7.365   PCO2ART 32.0 - 48.0 mmHg - - - 36.4 46.7   HCO3 20.0 - 28.0 mmol/L - - - 23.2 27.1   TCO2 22 - 32 mmol/L 27 25 24 24  29  ACIDBASEDEF 0.0 - 2.0 mmol/L - - - 1.0 -   O2SAT % - - - 100.0 91.0      Capillary Blood Glucose: Lab Results  Component Value Date   GLUCAP 85 04/13/2020   GLUCAP 123 (H) 04/13/2020   GLUCAP 104 (H) 04/12/2020   GLUCAP 140 (H) 04/12/2020   GLUCAP 128 (H) 04/12/2020     Exercise Target Goals: Exercise Program Goal: Individual exercise prescription set using results from initial 6 min walk test and THRR while considering  patient's activity barriers and safety.   Exercise Prescription Goal: Starting with aerobic activity 30 plus minutes a day, 3 days per week for initial exercise prescription. Provide home exercise prescription and guidelines that participant  acknowledges understanding prior to discharge.  Activity Barriers & Risk Stratification:  Activity Barriers & Cardiac Risk Stratification - 07/13/20 1155      Activity Barriers & Cardiac Risk Stratification   Activity Barriers Other (comment)    Comments ocasional dizziness with postion change. Essential tremors    Cardiac Risk Stratification High           6 Minute Walk:  6 Minute Walk    Row Name 07/13/20 0949         6 Minute Walk   Phase Initial     Distance 1400 feet     Walk Time 6 minutes     # of Rest Breaks 0     MPH 2.65     METS 3.19     RPE 9     Perceived Dyspnea  0     VO2 Peak 11.17     Symptoms No     Resting HR 77 bpm     Resting BP 114/60     Resting Oxygen Saturation  96 %     Exercise Oxygen Saturation  during 6 min walk 98 %     Max Ex. HR 90 bpm     Max Ex. BP 132/74     2 Minute Post BP 130/80            Oxygen Initial Assessment:   Oxygen Re-Evaluation:   Oxygen Discharge (Final Oxygen Re-Evaluation):   Initial Exercise Prescription:  Initial Exercise Prescription - 07/13/20 1200      Date of Initial Exercise RX and Referring Provider   Date 07/13/20    Referring Provider Lake Bells T. O'Neal, MD    Expected Discharge Date 09/08/20      NuStep   Level 2    SPM 75    Minutes 30    METs 2.4      Prescription Details   Frequency (times per week) 3    Duration Progress to 30 minutes of continuous aerobic without signs/symptoms of physical distress      Intensity   THRR 40-80% of Max Heartrate 60-119    Ratings of Perceived Exertion 11-13    Perceived Dyspnea 0-4      Progression   Progression Continue progressive overload as per policy without signs/symptoms or physical distress.      Resistance Training   Training Prescription Yes    Weight 4    Reps 10-15           Perform Capillary Blood Glucose checks as needed.  Exercise Prescription Changes:  Exercise Prescription Changes    Row Name 07/17/20 1000              Response to Exercise   Blood Pressure (Admit) 112/80  Blood Pressure (Exercise) 114/78       Blood Pressure (Exit) 118/82       Heart Rate (Admit) 79 bpm       Heart Rate (Exercise) 86 bpm       Heart Rate (Exit) 75 bpm       Rating of Perceived Exertion (Exercise) 11       Symptoms None       Comments Pt's first day in the [facility] program       Duration Progress to 30 minutes of  aerobic without signs/symptoms of physical distress       Intensity THRR unchanged               Progression   Progression Continue to progress workloads to maintain intensity without signs/symptoms of physical distress.       Average METs 1.7               Resistance Training   Training Prescription Yes       Weight 4       Reps 10-15       Time 10 Minutes               Interval Training   Interval Training No               T5 Nustep   Level 2       Minutes 30       METs 1.7              Exercise Comments:  Exercise Comments    Row Name 07/17/20 1012           Exercise Comments Pt's first day in the [facility] program. Pt tolerated session well with no complaints and is off to a good start.              Exercise Goals and Review:  Exercise Goals    Row Name 07/13/20 1158             Exercise Goals   Increase Physical Activity Yes       Intervention Provide advice, education, support and counseling about physical activity/exercise needs.;Develop an individualized exercise prescription for aerobic and resistive training based on initial evaluation findings, risk stratification, comorbidities and participant's personal goals.       Expected Outcomes Long Term: Exercising regularly at least 3-5 days a week.;Long Term: Add in home exercise to make exercise part of routine and to increase amount of physical activity.;Short Term: Attend rehab on a regular basis to increase amount of physical activity.       Increase Strength and Stamina Yes       Intervention Provide  advice, education, support and counseling about physical activity/exercise needs.;Develop an individualized exercise prescription for aerobic and resistive training based on initial evaluation findings, risk stratification, comorbidities and participant's personal goals.       Expected Outcomes Short Term: Increase workloads from initial exercise prescription for resistance, speed, and METs.;Short Term: Perform resistance training exercises routinely during rehab and add in resistance training at home;Long Term: Improve cardiorespiratory fitness, muscular endurance and strength as measured by increased METs and functional capacity (6MWT)       Able to understand and use rate of perceived exertion (RPE) scale Yes       Intervention Provide education and explanation on how to use RPE scale       Expected Outcomes Short Term: Able to use RPE daily in rehab to express  subjective intensity level;Long Term:  Able to use RPE to guide intensity level when exercising independently       Knowledge and understanding of Target Heart Rate Range (THRR) Yes       Intervention Provide education and explanation of THRR including how the numbers were predicted and where they are located for reference       Expected Outcomes Short Term: Able to use daily as guideline for intensity in rehab;Long Term: Able to use THRR to govern intensity when exercising independently;Short Term: Able to state/look up THRR       Understanding of Exercise Prescription Yes       Intervention Provide education, explanation, and written materials on patient's individual exercise prescription       Expected Outcomes Short Term: Able to explain program exercise prescription;Long Term: Able to explain home exercise prescription to exercise independently              Exercise Goals Re-Evaluation :  Exercise Goals Re-Evaluation    Row Name 07/17/20 1005             Exercise Goal Re-Evaluation   Exercise Goals Review Increase Physical  Activity;Increase Strength and Stamina;Able to understand and use rate of perceived exertion (RPE) scale;Knowledge and understanding of Target Heart Rate Range (THRR);Able to check pulse independently;Understanding of Exercise Prescription       Comments Pt's first day in the [facility] program. Pt understnads the Exercise Rx, THRR, and RPE scale.       Expected Outcomes Will continue to monitor patient and progress exercise workloads as tolerated.               Discharge Exercise Prescription (Final Exercise Prescription Changes):  Exercise Prescription Changes - 07/17/20 1000      Response to Exercise   Blood Pressure (Admit) 112/80    Blood Pressure (Exercise) 114/78    Blood Pressure (Exit) 118/82    Heart Rate (Admit) 79 bpm    Heart Rate (Exercise) 86 bpm    Heart Rate (Exit) 75 bpm    Rating of Perceived Exertion (Exercise) 11    Symptoms None    Comments Pt's first day in the [facility] program    Duration Progress to 30 minutes of  aerobic without signs/symptoms of physical distress    Intensity THRR unchanged      Progression   Progression Continue to progress workloads to maintain intensity without signs/symptoms of physical distress.    Average METs 1.7      Resistance Training   Training Prescription Yes    Weight 4    Reps 10-15    Time 10 Minutes      Interval Training   Interval Training No      T5 Nustep   Level 2    Minutes 30    METs 1.7           Nutrition:  Target Goals: Understanding of nutrition guidelines, daily intake of sodium 1500mg , cholesterol 200mg , calories 30% from fat and 7% or less from saturated fats, daily to have 5 or more servings of fruits and vegetables.  Biometrics:  Pre Biometrics - 07/13/20 1013      Pre Biometrics   Waist Circumference 35.5 inches    Hip Circumference 38 inches    Waist to Hip Ratio 0.93 %    Triceps Skinfold 11 mm    % Body Fat 22.7 %    Grip Strength 40 kg    Flexibility 13 in  Single Leg Stand 30  seconds            Nutrition Therapy Plan and Nutrition Goals:  Nutrition Therapy & Goals - 07/19/20 1010      Nutrition Therapy   Diet TLC      Personal Nutrition Goals   Nutrition Goal Pt to build a healthy plate including vegetables, fruits, whole grains, and low-fat dairy products in a heart healthy meal plan.    Personal Goal #2 Pt to reduce sodium intake <2300 mg/day      Intervention Plan   Intervention Prescribe, educate and counsel regarding individualized specific dietary modifications aiming towards targeted core components such as weight, hypertension, lipid management, diabetes, heart failure and other comorbidities.;Nutrition handout(s) given to patient.    Expected Outcomes Long Term Goal: Adherence to prescribed nutrition plan.;Short Term Goal: Understand basic principles of dietary content, such as calories, fat, sodium, cholesterol and nutrients.           Nutrition Assessments:  MEDIFICTS Score Key:  ?70 Need to make dietary changes   40-70 Heart Healthy Diet  ? 40 Therapeutic Level Cholesterol Diet  Flowsheet Row CARDIAC REHAB PHASE II EXERCISE from 07/19/2020 in [facility]  Picture Your Plate Total Score on Admission 59     Picture Your Plate Scores:  <92 Unhealthy dietary pattern with much room for improvement.  41-50 Dietary pattern unlikely to meet recommendations for good health and room for improvement.  51-60 More healthful dietary pattern, with some room for improvement.   >60 Healthy dietary pattern, although there may be some specific behaviors that could be improved.    Nutrition Goals Re-Evaluation:  Nutrition Goals Re-Evaluation    [place] Name 07/19/20 1023             Goals   Current Weight 155 lb (70.3 kg)       Nutrition Goal Pt to build a healthy plate including vegetables, fruits, whole grains, and low-fat dairy products in a heart healthy meal plan.               Personal Goal #2  Re-Evaluation   Personal Goal #2 Pt to reduce sodium intake <2300 mg/day              Nutrition Goals Discharge (Final Nutrition Goals Re-Evaluation):  Nutrition Goals Re-Evaluation - 07/19/20 1023      Goals   Current Weight 155 lb (70.3 kg)    Nutrition Goal Pt to build a healthy plate including vegetables, fruits, whole grains, and low-fat dairy products in a heart healthy meal plan.      Personal Goal #2 Re-Evaluation   Personal Goal #2 Pt to reduce sodium intake <2300 mg/day           Psychosocial: Target Goals: Acknowledge presence or absence of significant depression and/or stress, maximize coping skills, provide positive support system. Participant is able to verbalize types and ability to use techniques and skills needed for reducing stress and depression.  Initial Review & Psychosocial Screening:  Initial Psych Review & Screening - 07/13/20 1321      Initial Review   Current issues with Current Depression;Current Anxiety/Panic;History of Depression;Current Stress Concerns    Source of Stress Concerns Chronic Illness;Family    Comments Ilario reports having a long history of depression and anxiety      Family Dynamics   Good Support System? Yes   Edmar says that he has his wife for support   Comments Corde admits  to being an introvert.  May says that his current antidepressant medication is working for him.      Barriers   Psychosocial barriers to participate in program The patient should benefit from training in stress management and relaxation.      Screening Interventions   Interventions Encouraged to exercise;To provide support and resources with identified psychosocial needs;Provide feedback about the scores to participant    Expected Outcomes Short Term goal: Utilizing psychosocial counselor, staff and physician to assist with identification of specific Stressors or current issues interfering with healing process. Setting desired goal for each stressor or current  issue identified.;Long Term Goal: Stressors or current issues are controlled or eliminated.;Short Term goal: Identification and review with participant of any Quality of Life or Depression concerns found by scoring the questionnaire.;Long Term goal: The participant improves quality of Life and PHQ9 Scores as seen by post scores and/or verbalization of changes           Quality of Life Scores:  Quality of Life - 07/13/20 1149      Quality of Life   Select Quality of Life      Quality of Life Scores   Health/Function Pre 17.27 %    Socioeconomic Pre 19.33 %    Psych/Spiritual Pre 14.07 %    Family Pre 19.4 %    GLOBAL Pre 17.29 %          Scores of 19 and below usually indicate a poorer quality of life in these areas.  A difference of  2-3 points is a clinically meaningful difference.  A difference of 2-3 points in the total score of the Quality of Life Index has been associated with significant improvement in overall quality of life, self-image, physical symptoms, and general health in studies assessing change in quality of life.  PHQ-9: Recent Review Flowsheet Data    Depression screen [place] 2/9 07/13/2020   Decreased Interest 3   Down, Depressed, Hopeless 1   PHQ - 2 Score 4   Altered sleeping 0   Tired, decreased energy 3   Change in appetite 1   Feeling bad or failure about yourself  2   Trouble concentrating 0   Moving slowly or fidgety/restless 0   Suicidal thoughts 0   PHQ-9 Score 10   Difficult doing work/chores Somewhat difficult     Interpretation of Total Score  Total Score Depression Severity:  1-4 = Minimal depression, 5-9 = Mild depression, 10-14 = Moderate depression, 15-19 = Moderately severe depression, 20-27 = Severe depression   Psychosocial Evaluation and Intervention:   Psychosocial Re-Evaluation:  Psychosocial Re-Evaluation    [place] Name 07/25/20 1449             Psychosocial Re-Evaluation   Current issues with Current Depression;History of  Depression;Current Anxiety/Panic;Current Stress Concerns       Comments Reviewed and forwarded Keanon's quality of life questionnaire to his primary care physician Dr Dagmar Hait. Shawna reports feeling less depressed since starting cardiac rehab.       Expected Outcomes Jeryn will have decreased/ controlled depression upon completion of phase 2 cardiac rehab       Interventions Stress management education;Encouraged to attend Cardiac Rehabilitation for the exercise       Continue Psychosocial Services  Follow up required by staff       Comments Will continue to monitor and offer support as needed               Initial Review  Source of Stress Concerns Family              Psychosocial Discharge (Final Psychosocial Re-Evaluation):  Psychosocial Re-Evaluation - 07/25/20 1449      Psychosocial Re-Evaluation   Current issues with Current Depression;History of Depression;Current Anxiety/Panic;Current Stress Concerns    Comments Reviewed and forwarded Jonathan's quality of life questionnaire to his primary care physician Dr Dagmar Hait. Norvil reports feeling less depressed since starting cardiac rehab.    Expected Outcomes Khan will have decreased/ controlled depression upon completion of phase 2 cardiac rehab    Interventions Stress management education;Encouraged to attend Cardiac Rehabilitation for the exercise    Continue Psychosocial Services  Follow up required by staff    Comments Will continue to monitor and offer support as needed      Initial Review   Source of Stress Concerns Family           Vocational Rehabilitation: Provide vocational rehab assistance to qualifying candidates.   Vocational Rehab Evaluation & Intervention:  Vocational Rehab - 07/13/20 1351      Initial Vocational Rehab Evaluation & Intervention   Assessment shows need for Vocational Rehabilitation No   Azul is retired and does not need vocational rehab at this time          Education: Education Goals: Education classes  will be provided on a weekly basis, covering required topics. Participant will state understanding/return demonstration of topics presented.  Learning Barriers/Preferences:  Learning Barriers/Preferences - 07/13/20 1151      Learning Barriers/Preferences   Learning Barriers Sight   wears glasses, hearing aids,   Learning Preferences Written Material;Pictoral;Computer/Internet;Video           Education Topics: Hypertension, Hypertension Reduction -Define heart disease and high blood pressure. Discus how high blood pressure affects the body and ways to reduce high blood pressure.   Exercise and Your Heart -Discuss why it is important to exercise, the FITT principles of exercise, normal and abnormal responses to exercise, and how to exercise safely.   Angina -Discuss definition of angina, causes of angina, treatment of angina, and how to decrease risk of having angina.   Cardiac Medications -Review what the following cardiac medications are used for, how they affect the body, and side effects that may occur when taking the medications.  Medications include Aspirin, Beta blockers, calcium channel blockers, ACE Inhibitors, angiotensin receptor blockers, diuretics, digoxin, and antihyperlipidemics.   Congestive Heart Failure -Discuss the definition of CHF, how to live with CHF, the signs and symptoms of CHF, and how keep track of weight and sodium intake.   Heart Disease and Intimacy -Discus the effect sexual activity has on the heart, how changes occur during intimacy as we age, and safety during sexual activity.   Smoking Cessation / COPD -Discuss different methods to quit smoking, the health benefits of quitting smoking, and the definition of COPD.   Nutrition I: Fats -Discuss the types of cholesterol, what cholesterol does to the heart, and how cholesterol levels can be controlled.   Nutrition II: Labels -Discuss the different components of food labels and how to read  food label   Heart Parts/Heart Disease and PAD -Discuss the anatomy of the heart, the pathway of blood circulation through the heart, and these are affected by heart disease.   Stress I: Signs and Symptoms -Discuss the causes of stress, how stress may lead to anxiety and depression, and ways to limit stress.   Stress II: Relaxation -Discuss different types of relaxation techniques to  limit stress.   Warning Signs of Stroke / TIA -Discuss definition of a stroke, what the signs and symptoms are of a stroke, and how to identify when someone is having stroke.   Knowledge Questionnaire Score:  Knowledge Questionnaire Score - 07/13/20 1150      Knowledge Questionnaire Score   Pre Score 19/24           Core Components/Risk Factors/Patient Goals at Admission:  Personal Goals and Risk Factors at Admission - 07/13/20 1150      Core Components/Risk Factors/Patient Goals on Admission    Weight Management Weight Maintenance;Yes    Intervention Weight Management: Develop a combined nutrition and exercise program designed to reach desired caloric intake, while maintaining appropriate intake of nutrient and fiber, sodium and fats, and appropriate energy expenditure required for the weight goal.;Weight Management: Provide education and appropriate resources to help participant work on and attain dietary goals.    Expected Outcomes Short Term: Continue to assess and modify interventions until short term weight is achieved;Long Term: Adherence to nutrition and physical activity/exercise program aimed toward attainment of established weight goal;Weight Maintenance: Understanding of the daily nutrition guidelines, which includes 25-35% calories from fat, 7% or less cal from saturated fats, less than 200mg  cholesterol, less than 1.5gm of sodium, & 5 or more servings of fruits and vegetables daily;Understanding recommendations for meals to include 15-35% energy as protein, 25-35% energy from fat, 35-60%  energy from carbohydrates, less than 200mg  of dietary cholesterol, 20-35 gm of total fiber daily    Hypertension Yes    Intervention Provide education on lifestyle modifcations including regular physical activity/exercise, weight management, moderate sodium restriction and increased consumption of fresh fruit, vegetables, and low fat dairy, alcohol moderation, and smoking cessation.;Monitor prescription use compliance.    Expected Outcomes Short Term: Continued assessment and intervention until BP is < 140/46mm HG in hypertensive participants. < 130/73mm HG in hypertensive participants with diabetes, heart failure or chronic kidney disease.;Long Term: Maintenance of blood pressure at goal levels.           Core Components/Risk Factors/Patient Goals Review:   Goals and Risk Factor Review    Row Name 07/25/20 1507             Core Components/Risk Factors/Patient Goals Review   Personal Goals Review Weight Management/Obesity;Hypertension       Review Joanthony is off to a good start to exercise. Jaquavion's vital signs have been stable.       Expected Outcomes Belinda will continue to participate in phase 2 cardiac rehab for exercise, nutrition and lifestyle modifications              Core Components/Risk Factors/Patient Goals at Discharge (Final Review):   Goals and Risk Factor Review - 07/25/20 1507      Core Components/Risk Factors/Patient Goals Review   Personal Goals Review Weight Management/Obesity;Hypertension    Review Tayten is off to a good start to exercise. Traevon's vital signs have been stable.    Expected Outcomes Daksh will continue to participate in phase 2 cardiac rehab for exercise, nutrition and lifestyle modifications           ITP Comments:  ITP Comments    Row Name 07/13/20 0849 07/25/20 1447         ITP Comments Dr Fransico Him MD, Medical Director 30 Day ITP Review. Amjad has good attendance and participation in phase 2 cardiac rehab.             Comments: See  ITP  comments.Barnet Pall, RN,BSN 07/25/2020 3:16 PM

## 2020-07-26 ENCOUNTER — Other Ambulatory Visit: Payer: Self-pay

## 2020-07-26 ENCOUNTER — Encounter (HOSPITAL_COMMUNITY)
Admission: RE | Admit: 2020-07-26 | Discharge: 2020-07-26 | Disposition: A | Discharge status: Home / Self Care | Payer: PPO | Source: Ambulatory Visit | Attending: Cardiovascular Disease | Admitting: Cardiovascular Disease

## 2020-07-26 DIAGNOSIS — Z9889 Other specified postprocedural states: Secondary | ICD-10-CM | POA: Diagnosis not present

## 2020-07-26 NOTE — Progress Notes (Signed)
Cardiology Office Note:   Date:  07/27/2020  NAME:  Tyler [name]    MRN: [id number] DOB:  08/25/1950   PCP:  Prince Solian, MD  Cardiologist:  None  Electrophysiologist:  None   Referring MD: Prince Solian, MD   Chief Complaint  Patient presents with  . Follow-up   History of Present Illness:   Tyler [name] is a 70 y.o. male with a hx of severe MR s/p MV repair, HTN, subclinical LV dysfunction who presents for follow-up.  Most recent echocardiogram shows stable mitral valve repair.  Unfortunately his EF is now around 45%.  He has no symptoms.  He is participating cardiac rehab.  He is walking and riding his bike for 30 to 40 minutes daily.  No issues.  No swelling in his legs.  He was counseled on SBE prophylaxis.  He will need to take antibiotics before dental work or GU work.  He will let us know if he has any surgery.  He has completed Coumadin.  BP 120/72.  Overall very stable.  Problem List 1. HTN 2. Colonic varices  3. Severe MR -A3 prolapse  -Complex valvuloplasty/30 mm annuloplasty 04/11/2020 -no CAD 4. Subclinical LV dysfunction  -LVEF 40-45% after MV repair   Past Medical History: Past Medical History:  Diagnosis Date  . Adenomatous polyp of colon 02/2004  . Anemia   . Anxiety   . Arthritis   . BPH (benign prostatic hyperplasia)   . Colonic varices   . Degenerative joint disease   . Depression   . Depression   . Fatty liver   . GERD (gastroesophageal reflux disease)   . Heart murmur   . Hemorrhoids   . Hyperlipidemia   . Hypertension   . IBS (irritable bowel syndrome)   . Prostate cancer ([place]) 2011  . S/P minimally invasive mitral valve repair 04/11/2020   Complex valvuloplasty including artificial Gore-tex neochord placement x8 with suture plication of posterior commissure and 30 mm Sorin Memo 3D ring annuloplasty via right mini thoracotomy approach  . Thrombocytopenia ([place])   . Thyroid nodule     Past Surgical History: Past Surgical History:   Procedure Laterality Date  . BUBBLE STUDY  12/22/2019   Procedure: BUBBLE STUDY;  Surgeon: Geralynn Rile, MD;  Location: [place];  Service: Cardiovascular;;  . CARDIAC CATHETERIZATION    . COLONOSCOPY    . MITRAL VALVE REPAIR Right 04/11/2020   Procedure: MINIMALLY INVASIVE MITRAL VALVE REPAIR (MVR) USING MEMO 4D RING SIZE 30MM;  Surgeon: Rexene Alberts, MD;  Location: [place];  Service: Open Heart Surgery;  Laterality: Right;  . PROSTATECTOMY    . RIGHT/LEFT HEART CATH AND CORONARY ANGIOGRAPHY N/A 12/22/2019   Procedure: RIGHT/LEFT HEART CATH AND CORONARY ANGIOGRAPHY;  Surgeon: [name], MD;  Location: [place] [facility];  Service: Cardiovascular;  Laterality: N/A;  . TEE WITHOUT CARDIOVERSION N/A 12/22/2019   Procedure: TRANSESOPHAGEAL ECHOCARDIOGRAM (TEE);  Surgeon: Geralynn Rile, MD;  Location: [place];  Service: Cardiovascular;  Laterality: N/A;  . TEE WITHOUT CARDIOVERSION N/A 04/11/2020   Procedure: TRANSESOPHAGEAL ECHOCARDIOGRAM (TEE);  Surgeon: Rexene Alberts, MD;  Location: [facility];  Service: Open Heart Surgery;  Laterality: N/A;  . TONSILLECTOMY    . TOOTH EXTRACTION  03/07/2020  . TYMPANOPLASTY  01/2016    Current Medications: Current Meds  Medication Sig  . amoxicillin (AMOXIL) 500 MG tablet Take 4 tablets 30-60 minutes before dental procedures  . buPROPion (WELLBUTRIN XL) 150 MG 24 hr  tablet Take 150 mg by mouth in the morning.   . dicyclomine (BENTYL) 10 MG capsule TAKE 1 CAPSULE BY MOUTH 3 TIMES DAILY AS NEEDED FOR SPASMS (ABD PAIN AND CRAMPING). (Patient taking differently: Take 10 mg by mouth daily as needed for spasms (or abdominal pain/cramping).)  . escitalopram (LEXAPRO) 5 MG tablet Take 5 mg by mouth every evening.   . hydroxypropyl methylcellulose / hypromellose (ISOPTO TEARS / GONIOVISC) 2.5 % ophthalmic solution Place 1 drop into both eyes 3 (three) times daily as needed (for dryness).   . metoprolol succinate (TOPROL-XL) 25 MG 24 hr  tablet Take 0.5 tablets (12.5 mg total) by mouth daily. (Patient taking differently: Take 25 mg by mouth daily.)  . pantoprazole (PROTONIX) 40 MG tablet TAKE 1 TABLET BY MOUTH EVERY DAY (Patient taking differently: Take 40 mg by mouth daily before breakfast.)  . sildenafil (VIAGRA) 100 MG tablet Take 100 mg by mouth daily as needed for erectile dysfunction.  . traZODone (DESYREL) 50 MG tablet Take 50 mg by mouth at bedtime.     Allergies:    Patient has no known allergies.   Social History: Social History   Socioeconomic History  . Marital status: Married    Spouse name: Not on file  . Number of children: 3  . Years of education: 7  . Highest education level: Master's degree (e.g., MA, MS, MEng, MEd, MSW, MBA)  Occupational History  . Occupation: Retired  Tobacco Use  . Smoking status: Never Smoker  . Smokeless tobacco: Never Used  Vaping Use  . Vaping Use: Never used  Substance and Sexual Activity  . Alcohol use: Yes    Comment: occ  . Drug use: No  . Sexual activity: Not on file  Other Topics Concern  . Not on file  Social History Narrative  . Not on file   Social Determinants of Health   Financial Resource Strain: Not on file  Food Insecurity: Not on file  Transportation Needs: Not on file  Physical Activity: Not on file  Stress: Not on file  Social Connections: Not on file     Family History: The patient's family history includes Colon polyps in his father; Depression in an other family member; Heart disease in his father and mother; Stroke in his brother. There is no history of Colon cancer, Esophageal cancer, Stomach cancer, or Celiac disease.  ROS:   All other ROS reviewed and negative. Pertinent positives noted in the HPI.     EKGs/Labs/Other Studies Reviewed:   The following studies were personally reviewed by me today:  TTE 12/22/2019 1. There is severe prolapse of the A3 scallop with flail segment. There  is severe eccentric MR. 2D PISA 1.4 cm, VC  0.8 cm, 2D ERO 1.02 cm2, Rvol  137 cc. 3D VCA 1.15 cm2, Rvol 154 cc. Flail gap ~5 mm and flail width 15  mm. MVA 5.71 cm2. There is systolic  flow reversal in all 4 pulmonary veins. The left ventricle is mildly  dilated. The mitral valve is abnormal. Severe mitral valve regurgitation.  No evidence of mitral stenosis.  2. Left ventricular ejection fraction, by estimation, is 60 to 65%. Left  ventricular ejection fraction by 3D volume is 65 %. The left ventricle has  normal function. The left ventricle has no regional wall motion  abnormalities. The left ventricular  internal cavity size was mildly dilated.  3. Right ventricular systolic function is normal. The right ventricular  size is normal.  4. Left atrial  size was moderately dilated. No left atrial/left atrial  appendage thrombus was detected.  5. The aortic valve is tricuspid. Aortic valve regurgitation is not  visualized. No aortic stenosis is present.  6. There is mild (Grade II) layered plaque involving the transverse  aorta.   TTE 05/25/2020 1. Left ventricular ejection fraction, by estimation, is 40 to 45%. The  left ventricle has mildly decreased function. The left ventricle has no  regional wall motion abnormalities. Left ventricular diastolic function  could not be evaluated.  2. Right ventricular systolic function is normal. The right ventricular  size is normal. There is normal pulmonary artery systolic pressure. The  estimated right ventricular systolic pressure is 78.5 mmHg.  3. Left atrial size was mild to moderately dilated.  4. The mitral valve has been repaired/replaced. No evidence of mitral  valve regurgitation. No evidence of mitral stenosis. The mean mitral valve  gradient is 4.0 mmHg with average heart rate of 80 bpm. There is a  prosthetic annuloplasty ring present in  the mitral position. Procedure Date: 04/11/2020.  5. Tricuspid valve regurgitation is mild to moderate.  6. The aortic valve is  normal in structure. Aortic valve regurgitation is  not visualized. No aortic stenosis is present.  7. The inferior vena cava is normal in size with greater than 50%  respiratory variability, suggesting right atrial pressure of 3 mmHg.   Recent Labs: 12/10/2019: BNP 14.9 04/07/2020: ALT 17 04/14/2020: Magnesium 1.8 04/28/2020: BUN 15; Creatinine, Ser 0.97; Hemoglobin 12.8; Platelets 163; Potassium 4.7; Sodium 142   Recent Lipid Panel No results found for: CHOL, TRIG, HDL, CHOLHDL, VLDL, LDLCALC, LDLDIRECT  Physical Exam:   VS:  BP 120/72   Pulse 83   Ht 5\' 8"  (1.727 m)   Wt 155 lb 9.6 oz (70.6 kg)   SpO2 99%   BMI 23.66 kg/m    Wt Readings from Last 3 Encounters:  07/27/20 155 lb 9.6 oz (70.6 kg)  07/13/20 155 lb 10.3 oz (70.6 kg)  06/19/20 155 lb (70.3 kg)    General: Well nourished, well developed, in no acute distress Head: Atraumatic, normal size  Eyes: PEERLA, EOMI  Neck: Supple, no JVD Endocrine: No thryomegaly Cardiac: Normal S1, S2; RRR; no murmurs, rubs, or gallops Lungs: Clear to auscultation bilaterally, no wheezing, rhonchi or rales  Abd: Soft, nontender, no hepatomegaly  Ext: No edema, pulses 2+ Musculoskeletal: No deformities, BUE and BLE strength normal and equal Skin: Warm and dry, no rashes   Neuro: Alert and oriented to person, place, time, and situation, CNII-XII grossly intact, no focal deficits  Psych: Normal mood and affect   ASSESSMENT:   Tyler [name] is a 70 y.o. male who presents for the following: 1. S/P minimally invasive mitral valve repair   2. Nonrheumatic mitral valve regurgitation   3. SBE (subacute bacterial endocarditis) prophylaxis candidate   4. Primary hypertension   5. Asymptomatic LV dysfunction     PLAN:   1. S/P minimally invasive mitral valve repair 2. Nonrheumatic mitral valve regurgitation 3. SBE (subacute bacterial endocarditis) prophylaxis candidate -Found to have asymptomatic severe mitral regurgitation with A3  prolapse.  Underwent complex valvuloplasty with 30 mm annuloplasty ring on 04/11/2020.  He has completed Coumadin.  He is doing well since surgery. -EF has unfortunately dropped to 45%.  This is expected after severe mitral regurgitation. -He will need SBE prophylaxis.  He was given a prescription for 2 g of amoxicillin before any dental or GU work.  He will  let us know if he has any surgeries. -Exam is stable and consistent with no evidence of mitral regurgitation.  Most recent echo shows great repair.  4. Primary hypertension -Stable.  5. Asymptomatic LV dysfunction -EF is now 45%.  No symptoms of congestive heart failure.  No strong indication to treat this aggressively.  He is on metoprolol succinate 12.5 mg daily.  We will add lisinopril 2.5 mg daily just to see if this will help his function.  We will plan to recheck at some point.  Regardless, he is without symptoms of congestive heart failure and doing well.  Disposition: Return in about 6 months (around 01/27/2021).  Medication Adjustments/Labs and Tests Ordered: Current medicines are reviewed at length with the patient today.  Concerns regarding medicines are outlined above.  No orders of the defined types were placed in this encounter.  Meds ordered this encounter  Medications  . amoxicillin (AMOXIL) 500 MG tablet    Sig: Take 4 tablets 30-60 minutes before dental procedures    Dispense:  4 tablet    Refill:  1    Patient Instructions  Medication Instructions:  Start Lisinopril 2.5 mg daily  Take 4 tablets of Amoxicillin 30-60 minutes before dental procedures.   *If you need a refill on your cardiac medications before your next appointment, please call your pharmacy*    Follow-Up: At [facility], you and your health needs are our priority.  As part of our continuing mission to provide you with exceptional heart care, we have created designated Provider Care Teams.  These Care Teams include your primary Cardiologist  (physician) and Advanced Practice Providers (APPs -  Physician Assistants and Nurse Practitioners) who all work together to provide you with the care you need, when you need it.  We recommend signing up for the patient portal called "MyChart".  Sign up information is provided on this After Visit Summary.  MyChart is used to connect with patients for Virtual Visits (Telemedicine).  Patients are able to view lab/test results, encounter notes, upcoming appointments, etc.  Non-urgent messages can be sent to your provider as well.   To learn more about what you can do with MyChart, go to [email or web address].    Your next appointment:   6 month(s)  The format for your next appointment:   In Person  Provider:   Eleonore Chiquito, MD        Time Spent with Patient: I have spent a total of 35 minutes with patient reviewing hospital notes, telemetry, EKGs, labs and examining the patient as well as establishing an assessment and plan that was discussed with the patient.  > 50% of time was spent in direct patient care.  Signed, Addison Naegeli. Audie Box, MD, [place]  [address] [place], [place] [address] [phone]  07/27/2020 8:28 AM

## 2020-07-27 ENCOUNTER — Encounter: Payer: Self-pay | Admitting: Cardiovascular Disease

## 2020-07-27 ENCOUNTER — Ambulatory Visit: Payer: PPO | Admitting: Cardiovascular Disease

## 2020-07-27 VITALS — BP 120/72 | HR 83 | Ht 68.0 in | Wt 155.6 lb

## 2020-07-27 DIAGNOSIS — I519 Heart disease, unspecified: Secondary | ICD-10-CM

## 2020-07-27 DIAGNOSIS — Z298 Encounter for other specified prophylactic measures: Secondary | ICD-10-CM | POA: Diagnosis not present

## 2020-07-27 DIAGNOSIS — I340 Nonrheumatic mitral (valve) insufficiency: Secondary | ICD-10-CM

## 2020-07-27 DIAGNOSIS — Z9889 Other specified postprocedural states: Secondary | ICD-10-CM | POA: Diagnosis not present

## 2020-07-27 DIAGNOSIS — I10 Essential (primary) hypertension: Secondary | ICD-10-CM

## 2020-07-27 MED ORDER — LISINOPRIL 2.5 MG PO TABS: 2.5000 mg | ORAL_TABLET | Freq: Every day | ORAL | 3 refills | Status: DC

## 2020-07-27 MED ORDER — AMOXICILLIN 500 MG PO TABS: ORAL_TABLET | ORAL | 1 refills | Status: DC

## 2020-07-27 NOTE — Patient Instructions (Signed)
Medication Instructions:  Start Lisinopril 2.5 mg daily  Take 4 tablets of Amoxicillin 30-60 minutes before dental procedures.   *If you need a refill on your cardiac medications before your next appointment, please call your pharmacy*    Follow-Up: At [facility], you and your health needs are our priority.  As part of our continuing mission to provide you with exceptional heart care, we have created designated Provider Care Teams.  These Care Teams include your primary Cardiologist (physician) and Advanced Practice Providers (APPs -  Physician Assistants and Nurse Practitioners) who all work together to provide you with the care you need, when you need it.  We recommend signing up for the patient portal called "MyChart".  Sign up information is provided on this After Visit Summary.  MyChart is used to connect with patients for Virtual Visits (Telemedicine).  Patients are able to view lab/test results, encounter notes, upcoming appointments, etc.  Non-urgent messages can be sent to your provider as well.   To learn more about what you can do with MyChart, go to [email or web address].    Your next appointment:   6 month(s)  The format for your next appointment:   In Person  Provider:   Eleonore Chiquito, MD

## 2020-07-28 ENCOUNTER — Encounter (HOSPITAL_COMMUNITY)
Admission: RE | Admit: 2020-07-28 | Discharge: 2020-07-28 | Disposition: A | Discharge status: Home / Self Care | Payer: PPO | Source: Ambulatory Visit | Attending: Cardiovascular Disease | Admitting: Cardiovascular Disease

## 2020-07-28 ENCOUNTER — Telehealth: Payer: Self-pay

## 2020-07-28 ENCOUNTER — Other Ambulatory Visit: Payer: Self-pay

## 2020-07-28 DIAGNOSIS — Z9889 Other specified postprocedural states: Secondary | ICD-10-CM | POA: Diagnosis not present

## 2020-07-28 NOTE — Telephone Encounter (Signed)
NOTES ON FILE  FROM [facility] [phone], SENT REFERRAL TO SCHEDULING

## 2020-08-02 ENCOUNTER — Other Ambulatory Visit: Payer: Self-pay

## 2020-08-02 ENCOUNTER — Encounter (HOSPITAL_COMMUNITY)
Admission: RE | Admit: 2020-08-02 | Discharge: 2020-08-02 | Disposition: A | Discharge status: Home / Self Care | Payer: PPO | Source: Ambulatory Visit | Attending: Cardiovascular Disease | Admitting: Cardiovascular Disease

## 2020-08-02 DIAGNOSIS — Z9889 Other specified postprocedural states: Secondary | ICD-10-CM | POA: Insufficient documentation

## 2020-08-03 ENCOUNTER — Telehealth: Payer: Self-pay

## 2020-08-03 ENCOUNTER — Other Ambulatory Visit: Payer: Self-pay | Admitting: *Deleted

## 2020-08-03 NOTE — Telephone Encounter (Signed)
Patient had mitral valve repair 04/11/20.  Anticoagulation complete

## 2020-08-03 NOTE — Telephone Encounter (Signed)
Received a refill request for warfarin 2.5mg  tablets but I do not see warfarin on his med list. I will route to pharmd pool for clarification

## 2020-08-04 ENCOUNTER — Encounter (HOSPITAL_COMMUNITY)
Admission: RE | Admit: 2020-08-04 | Discharge: 2020-08-04 | Disposition: A | Discharge status: Home / Self Care | Payer: PPO | Source: Ambulatory Visit | Attending: Cardiovascular Disease | Admitting: Cardiovascular Disease

## 2020-08-04 ENCOUNTER — Other Ambulatory Visit: Payer: Self-pay

## 2020-08-04 DIAGNOSIS — Z9889 Other specified postprocedural states: Secondary | ICD-10-CM

## 2020-08-07 ENCOUNTER — Encounter (HOSPITAL_COMMUNITY)
Admission: RE | Admit: 2020-08-07 | Discharge: 2020-08-07 | Disposition: A | Discharge status: Home / Self Care | Payer: PPO | Source: Ambulatory Visit | Attending: Cardiovascular Disease | Admitting: Cardiovascular Disease

## 2020-08-07 ENCOUNTER — Other Ambulatory Visit: Payer: Self-pay

## 2020-08-07 DIAGNOSIS — Z9889 Other specified postprocedural states: Secondary | ICD-10-CM

## 2020-08-09 ENCOUNTER — Encounter (HOSPITAL_COMMUNITY)
Admission: RE | Admit: 2020-08-09 | Discharge: 2020-08-09 | Disposition: A | Discharge status: Home / Self Care | Payer: PPO | Source: Ambulatory Visit | Attending: Cardiovascular Disease | Admitting: Cardiovascular Disease

## 2020-08-09 ENCOUNTER — Other Ambulatory Visit: Payer: Self-pay

## 2020-08-09 DIAGNOSIS — Z9889 Other specified postprocedural states: Secondary | ICD-10-CM | POA: Diagnosis not present

## 2020-08-09 NOTE — Progress Notes (Signed)
Reviewed home exercise Rx with patient today. Encouraged patient to begin walking at home 2-3 x/week for 30 minutes. Encouraged warm-up, cool-down, and stretching. Reviewed THRR of 60-119 and keeping his RPE between 11-13. Hydration encouraged. Weather parameters for tempeture and humidity reviewed for safe exercise outdoors. Reviewed S/S to terminate exercise and when to call MD vs 911. Pt verbalized understanding of the home exercise Rx and was provided a copy.   Lesly Rubenstein MS, ACSM-CEP, CCRP

## 2020-08-11 ENCOUNTER — Other Ambulatory Visit: Payer: Self-pay

## 2020-08-11 ENCOUNTER — Encounter (HOSPITAL_COMMUNITY)
Admission: RE | Admit: 2020-08-11 | Discharge: 2020-08-11 | Disposition: A | Discharge status: Home / Self Care | Payer: PPO | Source: Ambulatory Visit | Attending: Cardiovascular Disease | Admitting: Cardiovascular Disease

## 2020-08-11 DIAGNOSIS — Z9889 Other specified postprocedural states: Secondary | ICD-10-CM | POA: Diagnosis not present

## 2020-08-14 ENCOUNTER — Other Ambulatory Visit: Payer: Self-pay

## 2020-08-14 ENCOUNTER — Encounter (HOSPITAL_COMMUNITY)
Admission: RE | Admit: 2020-08-14 | Discharge: 2020-08-14 | Disposition: A | Discharge status: Home / Self Care | Payer: PPO | Source: Ambulatory Visit | Attending: Cardiovascular Disease | Admitting: Cardiovascular Disease

## 2020-08-14 DIAGNOSIS — Z9889 Other specified postprocedural states: Secondary | ICD-10-CM

## 2020-08-16 ENCOUNTER — Encounter (HOSPITAL_COMMUNITY)
Admission: RE | Admit: 2020-08-16 | Discharge: 2020-08-16 | Disposition: A | Discharge status: Home / Self Care | Payer: PPO | Source: Ambulatory Visit | Attending: Cardiovascular Disease | Admitting: Cardiovascular Disease

## 2020-08-16 ENCOUNTER — Other Ambulatory Visit: Payer: Self-pay

## 2020-08-16 DIAGNOSIS — Z9889 Other specified postprocedural states: Secondary | ICD-10-CM

## 2020-08-18 ENCOUNTER — Other Ambulatory Visit: Payer: Self-pay

## 2020-08-18 ENCOUNTER — Encounter (HOSPITAL_COMMUNITY)
Admission: RE | Admit: 2020-08-18 | Discharge: 2020-08-18 | Disposition: A | Discharge status: Home / Self Care | Payer: PPO | Source: Ambulatory Visit | Attending: Cardiovascular Disease | Admitting: Cardiovascular Disease

## 2020-08-18 DIAGNOSIS — Z9889 Other specified postprocedural states: Secondary | ICD-10-CM | POA: Diagnosis not present

## 2020-08-21 ENCOUNTER — Encounter (HOSPITAL_COMMUNITY)
Admission: RE | Admit: 2020-08-21 | Discharge: 2020-08-21 | Disposition: A | Discharge status: Home / Self Care | Payer: PPO | Source: Ambulatory Visit | Attending: Cardiovascular Disease | Admitting: Cardiovascular Disease

## 2020-08-21 ENCOUNTER — Other Ambulatory Visit: Payer: Self-pay

## 2020-08-21 DIAGNOSIS — Z9889 Other specified postprocedural states: Secondary | ICD-10-CM

## 2020-08-23 ENCOUNTER — Other Ambulatory Visit: Payer: Self-pay

## 2020-08-23 ENCOUNTER — Encounter (HOSPITAL_COMMUNITY)
Admission: RE | Admit: 2020-08-23 | Discharge: 2020-08-23 | Disposition: A | Discharge status: Home / Self Care | Payer: PPO | Source: Ambulatory Visit | Attending: Cardiovascular Disease | Admitting: Cardiovascular Disease

## 2020-08-23 DIAGNOSIS — Z9889 Other specified postprocedural states: Secondary | ICD-10-CM

## 2020-08-24 NOTE — Progress Notes (Signed)
Cardiac Individual Treatment Plan  Patient Details  Name: Tyler [name] MRN: [id number] Date of Birth: 03/31/1950 Referring Provider:   Flowsheet Row CARDIAC REHAB PHASE II ORIENTATION from 07/13/2020 in [place]  Referring Provider Lake Bells T. O'Neal, MD       Initial Encounter Date:  Flowsheet Row CARDIAC REHAB PHASE II ORIENTATION from 07/13/2020 in [place]  Date 07/13/20       Visit Diagnosis: 04/11/20 S/P mitral valve repair Mimimally Invasive  Patient's Home Medications on Admission:  Current Outpatient Medications:    amoxicillin (AMOXIL) 500 MG tablet, Take 4 tablets 30-60 minutes before dental procedures, Disp: 4 tablet, Rfl: 1   buPROPion (WELLBUTRIN XL) 150 MG 24 hr tablet, Take 150 mg by mouth in the morning. , Disp: , Rfl:    dicyclomine (BENTYL) 10 MG capsule, TAKE 1 CAPSULE BY MOUTH 3 TIMES DAILY AS NEEDED FOR SPASMS (ABD PAIN AND CRAMPING). (Patient taking differently: Take 10 mg by mouth daily as needed for spasms (or abdominal pain/cramping).), Disp: 90 capsule, Rfl: 3   escitalopram (LEXAPRO) 5 MG tablet, Take 5 mg by mouth every evening. , Disp: , Rfl:    hydroxypropyl methylcellulose / hypromellose (ISOPTO TEARS / GONIOVISC) 2.5 % ophthalmic solution, Place 1 drop into both eyes 3 (three) times daily as needed (for dryness). , Disp: , Rfl:    lisinopril (ZESTRIL) 2.5 MG tablet, Take 1 tablet (2.5 mg total) by mouth daily., Disp: 90 tablet, Rfl: 3   metoprolol succinate (TOPROL-XL) 25 MG 24 hr tablet, Take 0.5 tablets (12.5 mg total) by mouth daily. (Patient taking differently: Take 25 mg by mouth daily.), Disp: 45 tablet, Rfl: 3   pantoprazole (PROTONIX) 40 MG tablet, TAKE 1 TABLET BY MOUTH EVERY DAY (Patient taking differently: Take 40 mg by mouth daily before breakfast.), Disp: 90 tablet, Rfl: 0   sildenafil (VIAGRA) 100 MG tablet, Take 100 mg by mouth daily as needed for erectile dysfunction., Disp:  , Rfl:    traZODone (DESYREL) 50 MG tablet, Take 50 mg by mouth at bedtime., Disp: , Rfl:   Past Medical History: Past Medical History:  Diagnosis Date   Adenomatous polyp of colon 02/2004   Anemia    Anxiety    Arthritis    BPH (benign prostatic hyperplasia)    Colonic varices    Degenerative joint disease    Depression    Depression    Fatty liver    GERD (gastroesophageal reflux disease)    Heart murmur    Hemorrhoids    Hyperlipidemia    Hypertension    IBS (irritable bowel syndrome)    Prostate cancer ([place]) 2011   S/P minimally invasive mitral valve repair 04/11/2020   Complex valvuloplasty including artificial Gore-tex neochord placement x8 with suture plication of posterior commissure and 30 mm Sorin Memo 3D ring annuloplasty via right mini thoracotomy approach   Thrombocytopenia (HCC)    Thyroid nodule     Tobacco Use: Social History   Tobacco Use  Smoking Status Never  Smokeless Tobacco Never    Labs: Recent Review Flowsheet Data     Labs for ITP Cardiac and Pulmonary Rehab Latest Ref Rng & Units 04/11/2020 04/11/2020 04/11/2020 04/11/2020 04/11/2020   Hemoglobin A1c 4.8 - 5.6 % - - - - -   PHART 7.350 - 7.450 - - - 7.413 7.365   PCO2ART 32.0 - 48.0 mmHg - - - 36.4 46.7   HCO3 20.0 - 28.0 mmol/L - - -  23.2 27.1   TCO2 22 - 32 mmol/L 27 25 24 24 29    ACIDBASEDEF 0.0 - 2.0 mmol/L - - - 1.0 -   O2SAT % - - - 100.0 91.0       Capillary Blood Glucose: Lab Results  Component Value Date   GLUCAP 85 04/13/2020   GLUCAP 123 (H) 04/13/2020   GLUCAP 104 (H) 04/12/2020   GLUCAP 140 (H) 04/12/2020   GLUCAP 128 (H) 04/12/2020     Exercise Target Goals: Exercise Program Goal: Individual exercise prescription set using results from initial 6 min walk test and THRR while considering  patient's activity barriers and safety.   Exercise Prescription Goal: Starting with aerobic activity 30 plus minutes a day, 3 days per week for initial exercise prescription. Provide  home exercise prescription and guidelines that participant acknowledges understanding prior to discharge.  Activity Barriers & Risk Stratification:  Activity Barriers & Cardiac Risk Stratification - 07/13/20 1155       Activity Barriers & Cardiac Risk Stratification   Activity Barriers Other (comment)    Comments ocasional dizziness with postion change. Essential tremors    Cardiac Risk Stratification High             6 Minute Walk:  6 Minute Walk     Row Name 07/13/20 0949         6 Minute Walk   Phase Initial     Distance 1400 feet     Walk Time 6 minutes     # of Rest Breaks 0     MPH 2.65     METS 3.19     RPE 9     Perceived Dyspnea  0     VO2 Peak 11.17     Symptoms No     Resting HR 77 bpm     Resting BP 114/60     Resting Oxygen Saturation  96 %     Exercise Oxygen Saturation  during 6 min walk 98 %     Max Ex. HR 90 bpm     Max Ex. BP 132/74     2 Minute Post BP 130/80              Oxygen Initial Assessment:   Oxygen Re-Evaluation:   Oxygen Discharge (Final Oxygen Re-Evaluation):   Initial Exercise Prescription:  Initial Exercise Prescription - 07/13/20 1200       Date of Initial Exercise RX and Referring Provider   Date 07/13/20    Referring Provider Lake Bells T. O'Neal, MD    Expected Discharge Date 09/08/20      NuStep   Level 2    SPM 75    Minutes 30    METs 2.4      Prescription Details   Frequency (times per week) 3    Duration Progress to 30 minutes of continuous aerobic without signs/symptoms of physical distress      Intensity   THRR 40-80% of Max Heartrate 60-119    Ratings of Perceived Exertion 11-13    Perceived Dyspnea 0-4      Progression   Progression Continue progressive overload as per policy without signs/symptoms or physical distress.      Resistance Training   Training Prescription Yes    Weight 4    Reps 10-15             Perform Capillary Blood Glucose checks as needed.  Exercise  Prescription Changes:   Exercise Prescription Changes  [place] Name 07/17/20 1000 07/28/20 1000 08/09/20 1000 08/21/20 0858       Response to Exercise   Blood Pressure (Admit) 112/80 108/68 122/76 120/80    Blood Pressure (Exercise) 114/78 124/72 126/80 124/70    Blood Pressure (Exit) 118/82 104/72 118/74 114/70    Heart Rate (Admit) 79 bpm 89 bpm 74 bpm 79 bpm    Heart Rate (Exercise) 86 bpm 102 bpm 81 bpm 101 bpm    Heart Rate (Exit) 75 bpm 89 bpm 74 bpm 75 bpm    Rating of Perceived Exertion (Exercise) 11 11 12 12     Symptoms None None None None    Comments Pt's first day in the CRP2 program Reviewed METs Reviewed METs/Goals/Home exercise Rx --    Duration Progress to 30 minutes of  aerobic without signs/symptoms of physical distress Continue with 30 min of aerobic exercise without signs/symptoms of physical distress. Continue with 30 min of aerobic exercise without signs/symptoms of physical distress. Continue with 30 min of aerobic exercise without signs/symptoms of physical distress.    Intensity THRR unchanged THRR unchanged THRR unchanged THRR unchanged         Progression        Progression Continue to progress workloads to maintain intensity without signs/symptoms of physical distress. Continue to progress workloads to maintain intensity without signs/symptoms of physical distress. Continue to progress workloads to maintain intensity without signs/symptoms of physical distress. Continue to progress workloads to maintain intensity without signs/symptoms of physical distress.    Average METs 1.7 2.2 2.3 2.4         Resistance Training        Training Prescription Yes Yes No Yes    Weight 4 4 No weights on Wednesdays 4 lbs    Reps 10-15 10-15 -- 10-15    Time 10 Minutes 10 Minutes -- 10 Minutes         Interval Training        Interval Training No No No No         T5 Nustep        Level 2 2 3 3     SPM -- 85 85 85    Minutes 30 30 30 30     METs 1.7 2.2 2.3 2.4          Home Exercise Plan        Plans to continue exercise at -- -- Home (comment) Home (comment)    Frequency -- -- Add 2 additional days to program exercise sessions. Add 2 additional days to program exercise sessions.    Initial Home Exercises Provided -- -- 08/09/20 08/09/20            Exercise Comments:   Exercise Comments     Row Name 07/17/20 1012 07/28/20 1009 08/09/20 1010       Exercise Comments Pt's first day in the CRP2 program. Pt tolerated session well with no complaints and is off to a good start. Reviewed METs with patient. Pt continues to make good progress. Reviewed METs, goals, and Home exercise Rx. Pt verbalized understanding of home exercise Rx and was provided a copy.              Exercise Goals and Review:   Exercise Goals     Row Name 07/13/20 1158             Exercise Goals   Increase Physical Activity Yes       Intervention Provide advice, education, support and  counseling about physical activity/exercise needs.;Develop an individualized exercise prescription for aerobic and resistive training based on initial evaluation findings, risk stratification, comorbidities and participant's personal goals.       Expected Outcomes Long Term: Exercising regularly at least 3-5 days a week.;Long Term: Add in home exercise to make exercise part of routine and to increase amount of physical activity.;Short Term: Attend rehab on a regular basis to increase amount of physical activity.       Increase Strength and Stamina Yes       Intervention Provide advice, education, support and counseling about physical activity/exercise needs.;Develop an individualized exercise prescription for aerobic and resistive training based on initial evaluation findings, risk stratification, comorbidities and participant's personal goals.       Expected Outcomes Short Term: Increase workloads from initial exercise prescription for resistance, speed, and METs.;Short Term: Perform resistance  training exercises routinely during rehab and add in resistance training at home;Long Term: Improve cardiorespiratory fitness, muscular endurance and strength as measured by increased METs and functional capacity (6MWT)       Able to understand and use rate of perceived exertion (RPE) scale Yes       Intervention Provide education and explanation on how to use RPE scale       Expected Outcomes Short Term: Able to use RPE daily in rehab to express subjective intensity level;Long Term:  Able to use RPE to guide intensity level when exercising independently       Knowledge and understanding of Target Heart Rate Range (THRR) Yes       Intervention Provide education and explanation of THRR including how the numbers were predicted and where they are located for reference       Expected Outcomes Short Term: Able to use daily as guideline for intensity in rehab;Long Term: Able to use THRR to govern intensity when exercising independently;Short Term: Able to state/look up THRR       Understanding of Exercise Prescription Yes       Intervention Provide education, explanation, and written materials on patient's individual exercise prescription       Expected Outcomes Short Term: Able to explain program exercise prescription;Long Term: Able to explain home exercise prescription to exercise independently                Exercise Goals Re-Evaluation :  Exercise Goals Re-Evaluation     [place] Name 07/17/20 1005 08/09/20 1008           Exercise Goal Re-Evaluation   Exercise Goals Review Increase Physical Activity;Increase Strength and Stamina;Able to understand and use rate of perceived exertion (RPE) scale;Knowledge and understanding of Target Heart Rate Range (THRR);Able to check pulse independently;Understanding of Exercise Prescription Increase Physical Activity;Increase Strength and Stamina;Able to understand and use rate of perceived exertion (RPE) scale;Knowledge and understanding of Target Heart Rate  Range (THRR);Able to check pulse independently;Understanding of Exercise Prescription      Comments Pt's first day in the CRP2 program. Pt understnads the Exercise Rx, THRR, and RPE scale. Reviewed METs, Goals, and Home exercise Rx. Pt will begin walking 2-3x/week for 30 minutes in addtion to the CRP2 program. Pt voices having more energy for activities at home.      Expected Outcomes Will continue to monitor patient and progress exercise workloads as tolerated. Pt will walk at home 2-3 x/wweek.                Discharge Exercise Prescription (Final Exercise Prescription Changes):  Exercise Prescription Changes -  08/21/20 0858       Response to Exercise   Blood Pressure (Admit) 120/80    Blood Pressure (Exercise) 124/70    Blood Pressure (Exit) 114/70    Heart Rate (Admit) 79 bpm    Heart Rate (Exercise) 101 bpm    Heart Rate (Exit) 75 bpm    Rating of Perceived Exertion (Exercise) 12    Symptoms None    Duration Continue with 30 min of aerobic exercise without signs/symptoms of physical distress.    Intensity THRR unchanged      Progression   Progression Continue to progress workloads to maintain intensity without signs/symptoms of physical distress.    Average METs 2.4      Resistance Training   Training Prescription Yes    Weight 4 lbs    Reps 10-15    Time 10 Minutes      Interval Training   Interval Training No      T5 Nustep   Level 3    SPM 85    Minutes 30    METs 2.4      Home Exercise Plan   Plans to continue exercise at Home (comment)    Frequency Add 2 additional days to program exercise sessions.    Initial Home Exercises Provided 08/09/20             Nutrition:  Target Goals: Understanding of nutrition guidelines, daily intake of sodium 1500mg , cholesterol 200mg , calories 30% from fat and 7% or less from saturated fats, daily to have 5 or more servings of fruits and vegetables.  Biometrics:  Pre Biometrics - 07/13/20 1013       Pre  Biometrics   Waist Circumference 35.5 inches    Hip Circumference 38 inches    Waist to Hip Ratio 0.93 %    Triceps Skinfold 11 mm    % Body Fat 22.7 %    Grip Strength 40 kg    Flexibility 13 in    Single Leg Stand 30 seconds              Nutrition Therapy Plan and Nutrition Goals:  Nutrition Therapy & Goals - 07/19/20 1010       Nutrition Therapy   Diet TLC      Personal Nutrition Goals   Nutrition Goal Pt to build a healthy plate including vegetables, fruits, whole grains, and low-fat dairy products in a heart healthy meal plan.    Personal Goal #2 Pt to reduce sodium intake <2300 mg/day      Intervention Plan   Intervention Prescribe, educate and counsel regarding individualized specific dietary modifications aiming towards targeted core components such as weight, hypertension, lipid management, diabetes, heart failure and other comorbidities.;Nutrition handout(s) given to patient.    Expected Outcomes Long Term Goal: Adherence to prescribed nutrition plan.;Short Term Goal: Understand basic principles of dietary content, such as calories, fat, sodium, cholesterol and nutrients.             Nutrition Assessments:  MEDIFICTS Score Key: ?70 Need to make dietary changes  40-70 Heart Healthy Diet ? 40 Therapeutic Level Cholesterol Diet  Flowsheet Row CARDIAC REHAB PHASE II EXERCISE from 07/19/2020 in [place]  Picture Your Plate Total Score on Admission 59      Picture Your Plate Scores: <61 Unhealthy dietary pattern with much room for improvement. 41-50 Dietary pattern unlikely to meet recommendations for good health and room for improvement. 51-60 More healthful dietary pattern, with some  room for improvement.  >60 Healthy dietary pattern, although there may be some specific behaviors that could be improved.    Nutrition Goals Re-Evaluation:  Nutrition Goals Re-Evaluation     [place] Name 07/19/20 1023 08/21/20 0912 08/21/20  0913         Goals   Current Weight 155 lb (70.3 kg) 154 lb 8.7 oz (70.1 kg) 152 lb 5.4 oz (69.1 kg)     Nutrition Goal Pt to build a healthy plate including vegetables, fruits, whole grains, and low-fat dairy products in a heart healthy meal plan. Pt to build a healthy plate including vegetables, fruits, whole grains, and low-fat dairy products in a heart healthy meal plan. Pt to build a healthy plate including vegetables, fruits, whole grains, and low-fat dairy products in a heart healthy meal plan.           Personal Goal #2 Re-Evaluation       Personal Goal #2 Pt to reduce sodium intake <2300 mg/day Pt to reduce sodium intake <2300 mg/day Pt to reduce sodium intake <2300 mg/day             Nutrition Goals Discharge (Final Nutrition Goals Re-Evaluation):  Nutrition Goals Re-Evaluation - 08/21/20 0913       Goals   Current Weight 152 lb 5.4 oz (69.1 kg)    Nutrition Goal Pt to build a healthy plate including vegetables, fruits, whole grains, and low-fat dairy products in a heart healthy meal plan.      Personal Goal #2 Re-Evaluation   Personal Goal #2 Pt to reduce sodium intake <2300 mg/day             Psychosocial: Target Goals: Acknowledge presence or absence of significant depression and/or stress, maximize coping skills, provide positive support system. Participant is able to verbalize types and ability to use techniques and skills needed for reducing stress and depression.  Initial Review & Psychosocial Screening:  Initial Psych Review & Screening - 07/13/20 1321       Initial Review   Current issues with Current Depression;Current Anxiety/Panic;History of Depression;Current Stress Concerns    Source of Stress Concerns Chronic Illness;Family    Comments Danni reports having a long history of depression and anxiety      Family Dynamics   Good Support System? Yes   Jakeob says that he has his wife for support   Comments Kayshaun admits to being an introvert.  Salif says  that his current antidepressant medication is working for him.      Barriers   Psychosocial barriers to participate in program The patient should benefit from training in stress management and relaxation.      Screening Interventions   Interventions Encouraged to exercise;To provide support and resources with identified psychosocial needs;Provide feedback about the scores to participant    Expected Outcomes Short Term goal: Utilizing psychosocial counselor, staff and physician to assist with identification of specific Stressors or current issues interfering with healing process. Setting desired goal for each stressor or current issue identified.;Long Term Goal: Stressors or current issues are controlled or eliminated.;Short Term goal: Identification and review with participant of any Quality of Life or Depression concerns found by scoring the questionnaire.;Long Term goal: The participant improves quality of Life and PHQ9 Scores as seen by post scores and/or verbalization of changes             Quality of Life Scores:  Quality of Life - 07/13/20 1149       Quality of Life  Select Quality of Life      Quality of Life Scores   Health/Function Pre 17.27 %    Socioeconomic Pre 19.33 %    Psych/Spiritual Pre 14.07 %    Family Pre 19.4 %    GLOBAL Pre 17.29 %            Scores of 19 and below usually indicate a poorer quality of life in these areas.  A difference of  2-3 points is a clinically meaningful difference.  A difference of 2-3 points in the total score of the Quality of Life Index has been associated with significant improvement in overall quality of life, self-image, physical symptoms, and general health in studies assessing change in quality of life.  PHQ-9: Recent Review Flowsheet Data     Depression screen [facility] 2/9 07/13/2020   Decreased Interest 3   Down, Depressed, Hopeless 1   PHQ - 2 Score 4   Altered sleeping 0   Tired, decreased energy 3   Change in appetite  1   Feeling bad or failure about yourself  2   Trouble concentrating 0   Moving slowly or fidgety/restless 0   Suicidal thoughts 0   PHQ-9 Score 10   Difficult doing work/chores Somewhat difficult      Interpretation of Total Score  Total Score Depression Severity:  1-4 = Minimal depression, 5-9 = Mild depression, 10-14 = Moderate depression, 15-19 = Moderately severe depression, 20-27 = Severe depression   Psychosocial Evaluation and Intervention:   Psychosocial Re-Evaluation:  Psychosocial Re-Evaluation     [place] Name 07/25/20 1449 08/24/20 1222           Psychosocial Re-Evaluation   Current issues with Current Depression;History of Depression;Current Anxiety/Panic;Current Stress Concerns Current Depression;History of Depression;Current Anxiety/Panic;Current Stress Concerns      Comments Reviewed and forwarded Garron's quality of life questionnaire to his primary care physician Dr Dagmar Hait. Wilferd reports feeling less depressed since starting cardiac rehab. Livio has not voiced any increased stressors or depression since particiapating in phase 2 cardiac rehab      Expected Outcomes Kalif will have decreased/ controlled depression upon completion of phase 2 cardiac rehab Dashel will have decreased/ controlled depression upon completion of phase 2 cardiac rehab      Interventions Stress management education;Encouraged to attend Cardiac Rehabilitation for the exercise Stress management education;Encouraged to attend Cardiac Rehabilitation for the exercise      Continue Psychosocial Services  Follow up required by staff Follow up required by staff             Initial Review      Source of Stress Concerns Family Family      Comments Will continue to monitor and offer support as needed Will continue to monitor and offer support as needed              Psychosocial Discharge (Final Psychosocial Re-Evaluation):  Psychosocial Re-Evaluation - 08/24/20 1222       Psychosocial Re-Evaluation    Current issues with Current Depression;History of Depression;Current Anxiety/Panic;Current Stress Concerns    Comments Ferrell has not voiced any increased stressors or depression since particiapating in phase 2 cardiac rehab    Expected Outcomes Jayden will have decreased/ controlled depression upon completion of phase 2 cardiac rehab    Interventions Stress management education;Encouraged to attend Cardiac Rehabilitation for the exercise    Continue Psychosocial Services  Follow up required by staff      Initial Review   Source of Stress  Concerns Family    Comments Will continue to monitor and offer support as needed             Vocational Rehabilitation: Provide vocational rehab assistance to qualifying candidates.   Vocational Rehab Evaluation & Intervention:  Vocational Rehab - 07/13/20 1351       Initial Vocational Rehab Evaluation & Intervention   Assessment shows need for Vocational Rehabilitation No   Yeiren is retired and does not need vocational rehab at this time            Education: Education Goals: Education classes will be provided on a weekly basis, covering required topics. Participant will state understanding/return demonstration of topics presented.  Learning Barriers/Preferences:  Learning Barriers/Preferences - 07/13/20 1151       Learning Barriers/Preferences   Learning Barriers Sight   wears glasses, hearing aids,   Learning Preferences Written Material;Pictoral;Computer/Internet;Video             Education Topics: Hypertension, Hypertension Reduction -Define heart disease and high blood pressure. Discus how high blood pressure affects the body and ways to reduce high blood pressure.   Exercise and Your Heart -Discuss why it is important to exercise, the FITT principles of exercise, normal and abnormal responses to exercise, and how to exercise safely.   Angina -Discuss definition of angina, causes of angina, treatment of angina, and how to  decrease risk of having angina.   Cardiac Medications -Review what the following cardiac medications are used for, how they affect the body, and side effects that may occur when taking the medications.  Medications include Aspirin, Beta blockers, calcium channel blockers, ACE Inhibitors, angiotensin receptor blockers, diuretics, digoxin, and antihyperlipidemics.   Congestive Heart Failure -Discuss the definition of CHF, how to live with CHF, the signs and symptoms of CHF, and how keep track of weight and sodium intake.   Heart Disease and Intimacy -Discus the effect sexual activity has on the heart, how changes occur during intimacy as we age, and safety during sexual activity.   Smoking Cessation / COPD -Discuss different methods to quit smoking, the health benefits of quitting smoking, and the definition of COPD.   Nutrition I: Fats -Discuss the types of cholesterol, what cholesterol does to the heart, and how cholesterol levels can be controlled.   Nutrition II: Labels -Discuss the different components of food labels and how to read food label   Heart Parts/Heart Disease and PAD -Discuss the anatomy of the heart, the pathway of blood circulation through the heart, and these are affected by heart disease.   Stress I: Signs and Symptoms -Discuss the causes of stress, how stress may lead to anxiety and depression, and ways to limit stress.   Stress II: Relaxation -Discuss different types of relaxation techniques to limit stress.   Warning Signs of Stroke / TIA -Discuss definition of a stroke, what the signs and symptoms are of a stroke, and how to identify when someone is having stroke.   Knowledge Questionnaire Score:  Knowledge Questionnaire Score - 07/13/20 1150       Knowledge Questionnaire Score   Pre Score 19/24             Core Components/Risk Factors/Patient Goals at Admission:  Personal Goals and Risk Factors at Admission - 07/13/20 1150       Core  Components/Risk Factors/Patient Goals on Admission    Weight Management Weight Maintenance;Yes    Intervention Weight Management: Develop a combined nutrition and exercise program designed to reach desired  caloric intake, while maintaining appropriate intake of nutrient and fiber, sodium and fats, and appropriate energy expenditure required for the weight goal.;Weight Management: Provide education and appropriate resources to help participant work on and attain dietary goals.    Expected Outcomes Short Term: Continue to assess and modify interventions until short term weight is achieved;Long Term: Adherence to nutrition and physical activity/exercise program aimed toward attainment of established weight goal;Weight Maintenance: Understanding of the daily nutrition guidelines, which includes 25-35% calories from fat, 7% or less cal from saturated fats, less than 200mg  cholesterol, less than 1.5gm of sodium, & 5 or more servings of fruits and vegetables daily;Understanding recommendations for meals to include 15-35% energy as protein, 25-35% energy from fat, 35-60% energy from carbohydrates, less than 200mg  of dietary cholesterol, 20-35 gm of total fiber daily    Hypertension Yes    Intervention Provide education on lifestyle modifcations including regular physical activity/exercise, weight management, moderate sodium restriction and increased consumption of fresh fruit, vegetables, and low fat dairy, alcohol moderation, and smoking cessation.;Monitor prescription use compliance.    Expected Outcomes Short Term: Continued assessment and intervention until BP is < 140/65mm HG in hypertensive participants. < 130/38mm HG in hypertensive participants with diabetes, heart failure or chronic kidney disease.;Long Term: Maintenance of blood pressure at goal levels.             Core Components/Risk Factors/Patient Goals Review:   Goals and Risk Factor Review     Row Name 07/25/20 1507 08/24/20 1224            Core Components/Risk Factors/Patient Goals Review   Personal Goals Review Weight Management/Obesity;Hypertension Weight Management/Obesity;Hypertension      Review Edrik is off to a good start to exercise. Equan's vital signs have been stable. Roper continues to do well with exercise. Taevyn's vital signs have been stable. Yaasir will complete phase 2 cardiac rehab on 09/08/20.      Expected Outcomes Aidian will continue to participate in phase 2 cardiac rehab for exercise, nutrition and lifestyle modifications Hank will continue to participate in phase 2 cardiac rehab for exercise, nutrition and lifestyle modifications               Core Components/Risk Factors/Patient Goals at Discharge (Final Review):   Goals and Risk Factor Review - 08/24/20 1224       Core Components/Risk Factors/Patient Goals Review   Personal Goals Review Weight Management/Obesity;Hypertension    Review Darrelle continues to do well with exercise. Ezell's vital signs have been stable. Oneil will complete phase 2 cardiac rehab on 09/08/20.    Expected Outcomes Delrico will continue to participate in phase 2 cardiac rehab for exercise, nutrition and lifestyle modifications             ITP Comments:  ITP Comments     Row Name 07/13/20 0849 07/25/20 1447 08/24/20 1222       ITP Comments Dr Fransico Him MD, Medical Director 30 Day ITP Review. Jaxan has good attendance and participation in phase 2 cardiac rehab. 30 Day ITP Review. Kayman has good attendance and participation in phase 2 cardiac rehab.              Comments: See ITP comments.Barnet Pall, RN,BSN 08/24/2020 12:27 PM

## 2020-08-25 ENCOUNTER — Encounter (HOSPITAL_COMMUNITY)
Admission: RE | Admit: 2020-08-25 | Discharge: 2020-08-25 | Disposition: A | Discharge status: Home / Self Care | Payer: PPO | Source: Ambulatory Visit | Attending: Cardiovascular Disease | Admitting: Cardiovascular Disease

## 2020-08-25 ENCOUNTER — Other Ambulatory Visit: Payer: Self-pay

## 2020-08-25 DIAGNOSIS — Z9889 Other specified postprocedural states: Secondary | ICD-10-CM

## 2020-08-28 ENCOUNTER — Encounter (HOSPITAL_COMMUNITY)
Admission: RE | Admit: 2020-08-28 | Discharge: 2020-08-28 | Disposition: A | Discharge status: Home / Self Care | Payer: PPO | Source: Ambulatory Visit | Attending: Cardiovascular Disease | Admitting: Cardiovascular Disease

## 2020-08-28 ENCOUNTER — Other Ambulatory Visit: Payer: Self-pay

## 2020-08-28 DIAGNOSIS — Z9889 Other specified postprocedural states: Secondary | ICD-10-CM | POA: Diagnosis not present

## 2020-08-30 ENCOUNTER — Encounter (HOSPITAL_COMMUNITY)
Admission: RE | Admit: 2020-08-30 | Discharge: 2020-08-30 | Disposition: A | Discharge status: Home / Self Care | Payer: PPO | Source: Ambulatory Visit | Attending: Cardiovascular Disease | Admitting: Cardiovascular Disease

## 2020-08-30 ENCOUNTER — Other Ambulatory Visit: Payer: Self-pay

## 2020-08-30 DIAGNOSIS — Z9889 Other specified postprocedural states: Secondary | ICD-10-CM

## 2020-09-01 ENCOUNTER — Encounter (HOSPITAL_COMMUNITY)
Admission: RE | Admit: 2020-09-01 | Discharge: 2020-09-01 | Disposition: A | Discharge status: Home / Self Care | Payer: PPO | Source: Ambulatory Visit | Attending: Cardiovascular Disease | Admitting: Cardiovascular Disease

## 2020-09-01 ENCOUNTER — Other Ambulatory Visit: Payer: Self-pay

## 2020-09-01 VITALS — Ht 68.25 in | Wt 151.9 lb

## 2020-09-01 DIAGNOSIS — Z9889 Other specified postprocedural states: Secondary | ICD-10-CM | POA: Diagnosis not present

## 2020-09-06 ENCOUNTER — Other Ambulatory Visit: Payer: Self-pay

## 2020-09-06 ENCOUNTER — Encounter (HOSPITAL_COMMUNITY)
Admission: RE | Admit: 2020-09-06 | Discharge: 2020-09-06 | Disposition: A | Discharge status: Home / Self Care | Payer: PPO | Source: Ambulatory Visit | Attending: Cardiovascular Disease | Admitting: Cardiovascular Disease

## 2020-09-06 DIAGNOSIS — Z9889 Other specified postprocedural states: Secondary | ICD-10-CM

## 2020-09-06 NOTE — Progress Notes (Signed)
Discharge Progress Report  Patient Details  Name: Tyler [name] MRN: [id number] Date of Birth: [birth year] Referring Provider:   Flowsheet Row CARDIAC REHAB PHASE II ORIENTATION from 07/13/2020 in [facility]  Referring Provider Lake Bells T. O'Neal, MD        Number of Visits: 22  Reason for Discharge:  Patient reached a stable level of exercise. Patient independent in their exercise. Patient has met program and personal goals.  Smoking History:  Social History   Tobacco Use  Smoking Status Never  Smokeless Tobacco Never    Diagnosis:  04/11/20 S/P mitral valve repair Mimimally Invasive  ADL [facility]SD:   Initial Exercise Prescription:  Initial Exercise Prescription - 07/13/20 1200       Date of Initial Exercise RX and Referring Provider   Date 07/13/20    Referring Provider Lake Bells T. O'Neal, MD    Expected Discharge Date 09/08/20      NuStep   Level 2    SPM 75    Minutes 30    METs 2.4      Prescription Details   Frequency (times per week) 3    Duration Progress to 30 minutes of continuous aerobic without signs/symptoms of physical distress      Intensity   THRR 40-80% of Max Heartrate 60-119    Ratings of Perceived Exertion 11-13    Perceived Dyspnea 0-4      Progression   Progression Continue progressive overload as per policy without signs/symptoms or physical distress.      Resistance Training   Training Prescription Yes    Weight 4    Reps 10-15             Discharge Exercise Prescription (Final Exercise Prescription Changes):  Exercise Prescription Changes - 09/08/20 1000       Response to Exercise   Blood Pressure (Admit) 106/62    Blood Pressure (Exercise) 118/76    Blood Pressure (Exit) 110/56    Heart Rate (Admit) 85 bpm    Heart Rate (Exercise) 92 bpm    Heart Rate (Exit) 76 bpm    Rating of Perceived Exertion (Exercise) 12    Symptoms None    Comments Pt graduated from the CRP2 program today     Duration Continue with 30 min of aerobic exercise without signs/symptoms of physical distress.    Intensity THRR unchanged      Progression   Progression Continue to progress workloads to maintain intensity without signs/symptoms of physical distress.    Average METs 2.6      Resistance Training   Training Prescription Yes    Weight 4 lbs    Reps 10-15    Time 10 Minutes      Interval Training   Interval Training No      T5 Nustep   Level 4    SPM 85    Minutes 30    METs 2.6      Home Exercise Plan   Plans to continue exercise at Home (comment)    Frequency Add 2 additional days to program exercise sessions.    Initial Home Exercises Provided 08/09/20             Functional Capacity:  6 Minute Walk     Row Name 07/13/20 0949 09/01/20 0915       6 Minute Walk   Phase Initial --    Distance 1400 feet 1649 feet    Distance % Change -- 17.79 %  Distance Feet Change -- 149 ft    Walk Time 6 minutes 6 minutes    # of Rest Breaks 0 0    MPH 2.65 3.12    METS 3.19 3.48    RPE 9 12    Perceived Dyspnea  0 0    VO2 Peak 11.17 12.17    Symptoms No No    Resting HR 77 bpm 75 bpm    Resting BP 114/60 122/80    Resting Oxygen Saturation  96 % 96 %    Exercise Oxygen Saturation  during 6 min walk 98 % 100 %    Max Ex. HR 90 bpm 80 bpm    Max Ex. BP 132/74 118/64    2 Minute Post BP 130/80 100/73             Psychological, QOL, Others - Outcomes: PHQ 2/9: Depression screen [facility] 2/9 09/06/2020 07/13/2020  Decreased Interest 0 3  Down, Depressed, Hopeless 1 1  PHQ - 2 Score 1 4  Altered sleeping - 0  Tired, decreased energy - 3  Change in appetite - 1  Feeling bad or failure about yourself  - 2  Trouble concentrating - 0  Moving slowly or fidgety/restless - 0  Suicidal thoughts - 0  PHQ-9 Score - 10  Difficult doing work/chores Somewhat difficult Somewhat difficult    Quality of Life:  Quality of Life - 09/06/20 0930       Quality of Life   Select  Quality of Life      Quality of Life Scores   Health/Function Post 21.57 %    Socioeconomic Post 21.75 %    Psych/Spiritual Post 20.5 %    Family Post 22.1 %    GLOBAL Post 21.45 %             Personal Goals: Goals established at orientation with interventions provided to work toward goal.  Personal Goals and Risk Factors at Admission - 07/13/20 1150       Core Components/Risk Factors/Patient Goals on Admission    Weight Management Weight Maintenance;Yes    Intervention Weight Management: Develop a combined nutrition and exercise program designed to reach desired caloric intake, while maintaining appropriate intake of nutrient and fiber, sodium and fats, and appropriate energy expenditure required for the weight goal.;Weight Management: Provide education and appropriate resources to help participant work on and attain dietary goals.    Expected Outcomes Short Term: Continue to assess and modify interventions until short term weight is achieved;Long Term: Adherence to nutrition and physical activity/exercise program aimed toward attainment of established weight goal;Weight Maintenance: Understanding of the daily nutrition guidelines, which includes 25-35% calories from fat, 7% or less cal from saturated fats, less than 285m cholesterol, less than 1.5gm of sodium, & 5 or more servings of fruits and vegetables daily;Understanding recommendations for meals to include 15-35% energy as protein, 25-35% energy from fat, 35-60% energy from carbohydrates, less than 2057mof dietary cholesterol, 20-35 gm of total fiber daily    Hypertension Yes    Intervention Provide education on lifestyle modifcations including regular physical activity/exercise, weight management, moderate sodium restriction and increased consumption of fresh fruit, vegetables, and low fat dairy, alcohol moderation, and smoking cessation.;Monitor prescription use compliance.    Expected Outcomes Short Term: Continued assessment  and intervention until BP is < 140/9067mG in hypertensive participants. < 130/53m45m in hypertensive participants with diabetes, heart failure or chronic kidney disease.;Long Term: Maintenance of blood pressure at goal levels.  Personal Goals Discharge:  Goals and Risk Factor Review     Row Name 07/25/20 1507 08/24/20 1224 09/06/20 1003 09/08/20 0944       Core Components/Risk Factors/Patient Goals Review   Personal Goals Review Weight Management/Obesity;Hypertension Weight Management/Obesity;Hypertension Weight Management/Obesity;Hypertension --    Review Tyler [name] is off to a good start to exercise. Tyler [name]'s vital signs have been stable. Tyler [name] continues to do well with exercise. Tyler [name]'s vital signs have been stable. Tyler [name] will complete phase 2 cardiac rehab on 09/08/20. Tyler [name] continues to do well with exercise. Tyler [name]'s vital signs have been stable. Tyler [name] will complete phase 2 cardiac rehab on 09/08/20. Tyler [name] graduates today, 09/07/20 with the completion of 22 sessions.  Tyler [name]'s vital signs remained stable.  Pt discharge weight 68.5kg    Expected Outcomes Tyler [name] will continue to participate in phase 2 cardiac rehab for exercise, nutrition and lifestyle modifications Tyler [name] will continue to participate in phase 2 cardiac rehab for exercise, nutrition and lifestyle modifications Tyler [name] will continue to  exercise, nutrition and lifestyle modifications upon completion of phase 2 cardiac rehab Tyler [name] plans to continue heart healthy lifestyle modifications.  Tyler [name] plans to join gym and continue his exercise program.  Tyler [name] wa a delight to have in our Cardiac Rehab program.             Exercise Goals and Review:  Exercise Goals     Row Name 07/13/20 1158             Exercise Goals   Increase Physical Activity Yes       Intervention Provide advice, education, support and counseling about physical activity/exercise needs.;Develop an individualized exercise prescription for aerobic and resistive training  based on initial evaluation findings, risk stratification, comorbidities and participant's personal goals.       Expected Outcomes Long Term: Exercising regularly at least 3-5 days a week.;Long Term: Add in home exercise to make exercise part of routine and to increase amount of physical activity.;Short Term: Attend rehab on a regular basis to increase amount of physical activity.       Increase Strength and Stamina Yes       Intervention Provide advice, education, support and counseling about physical activity/exercise needs.;Develop an individualized exercise prescription for aerobic and resistive training based on initial evaluation findings, risk stratification, comorbidities and participant's personal goals.       Expected Outcomes Short Term: Increase workloads from initial exercise prescription for resistance, speed, and METs.;Short Term: Perform resistance training exercises routinely during rehab and add in resistance training at home;Long Term: Improve cardiorespiratory fitness, muscular endurance and strength as measured by increased METs and functional capacity (6MWT)       Able to understand and use rate of perceived exertion (RPE) scale Yes       Intervention Provide education and explanation on how to use RPE scale       Expected Outcomes Short Term: Able to use RPE daily in rehab to express subjective intensity level;Long Term:  Able to use RPE to guide intensity level when exercising independently       Knowledge and understanding of Target Heart Rate Range (THRR) Yes       Intervention Provide education and explanation of THRR including how the numbers were predicted and where they are located for reference       Expected Outcomes Short Term: Able to use daily as guideline for intensity in rehab;Long Term: Able to use THRR to govern intensity when exercising independently;Short Term: Able to state/look up  THRR       Understanding of Exercise Prescription Yes       Intervention Provide  education, explanation, and written materials on patient's individual exercise prescription       Expected Outcomes Short Term: Able to explain program exercise prescription;Long Term: Able to explain home exercise prescription to exercise independently                Exercise Goals Re-Evaluation:  Exercise Goals Re-Evaluation     [place] Name 07/17/20 1005 08/09/20 1008 09/08/20 1015         Exercise Goal Re-Evaluation   Exercise Goals Review Increase Physical Activity;Increase Strength and Stamina;Able to understand and use rate of perceived exertion (RPE) scale;Knowledge and understanding of Target Heart Rate Range (THRR);Able to check pulse independently;Understanding of Exercise Prescription Increase Physical Activity;Increase Strength and Stamina;Able to understand and use rate of perceived exertion (RPE) scale;Knowledge and understanding of Target Heart Rate Range (THRR);Able to check pulse independently;Understanding of Exercise Prescription Increase Physical Activity;Increase Strength and Stamina;Able to understand and use rate of perceived exertion (RPE) scale;Knowledge and understanding of Target Heart Rate Range (THRR);Able to check pulse independently;Understanding of Exercise Prescription     Comments Pt's first day in the CRP2 program. Pt understnads the Exercise Rx, THRR, and RPE scale. Reviewed METs, Goals, and Home exercise Rx. Pt will begin walking 2-3x/week for 30 minutes in addtion to the CRP2 program. Pt voices having more energy for activities at home. Pt graduated from the [facility] program today. Pt had an average MET level of 2.9 with a peak of 3.1 METs. Pt will continue to exercise at home by walking and will be going to a gym near him through the [employer] program.     Expected Outcomes Will continue to monitor patient and progress exercise workloads as tolerated. Pt will walk at home 2-3 x/wweek. PT will continue to exercise at home and his gym.               Nutrition & Weight - Outcomes:  Pre Biometrics - 07/13/20 1013       Pre Biometrics   Waist Circumference 35.5 inches    Hip Circumference 38 inches    Waist to Hip Ratio 0.93 %    Triceps Skinfold 11 mm    % Body Fat 22.7 %    Grip Strength 40 kg    Flexibility 13 in    Single Leg Stand 30 seconds             Post Biometrics - 09/01/20 1036        Post  Biometrics   Height 5' 8.25" (1.734 m)    Weight 68.9 kg    Waist Circumference 34.5 inches    Hip Circumference 37.5 inches    Waist to Hip Ratio 0.92 %    BMI (Calculated) 22.92    Triceps Skinfold 10 mm    % Body Fat 21.7 %    Grip Strength 41 kg    Flexibility 15 in    Single Leg Stand 30 seconds             Nutrition:  Nutrition Therapy & Goals - 07/19/20 1010       Nutrition Therapy   Diet TLC      Personal Nutrition Goals   Nutrition Goal Pt to build a healthy plate including vegetables, fruits, whole grains, and low-fat dairy products in a heart healthy meal plan.    Personal Goal #2 Pt to reduce  sodium intake <2300 mg/day      Intervention Plan   Intervention Prescribe, educate and counsel regarding individualized specific dietary modifications aiming towards targeted core components such as weight, hypertension, lipid management, diabetes, heart failure and other comorbidities.;Nutrition handout(s) given to patient.    Expected Outcomes Long Term Goal: Adherence to prescribed nutrition plan.;Short Term Goal: Understand basic principles of dietary content, such as calories, fat, sodium, cholesterol and nutrients.             Nutrition Discharge:   Education Questionnaire Score:  Knowledge Questionnaire Score - 09/06/20 1000       Knowledge Questionnaire Score   Post Score 23/24             Goals reviewed with patient; copy given to patient.Pt graduates from cardiac rehab program on 09/08/20 with completion of 36 exercise sessions in Phase II. Pt maintained good attendance and  progressed nicely during his participation in rehab as evidenced by increased MET level.   Medication list reconciled. Repeat  PHQ score- 1 . Tyler [name] says he will always have depression but feels much better since participating in the program.  Pt has made significant lifestyle changes and should be commended for his success. Pt feels he has achieved his goals during cardiac rehab.   Pt plans to continue exercise by enrolling in [place] at a gym near his home. Taji increased his distance on his post exercise walk test by 149 feet. We are proud of Tyler [name]'s progress.Barnet Pall, RN,BSN 09/17/2020 4:20 PM

## 2020-09-08 ENCOUNTER — Other Ambulatory Visit: Payer: Self-pay

## 2020-09-08 ENCOUNTER — Encounter (HOSPITAL_COMMUNITY)
Admission: RE | Admit: 2020-09-08 | Discharge: 2020-09-08 | Disposition: A | Discharge status: Home / Self Care | Payer: PPO | Source: Ambulatory Visit | Attending: Cardiovascular Disease | Admitting: Cardiovascular Disease

## 2020-09-08 DIAGNOSIS — Z9889 Other specified postprocedural states: Secondary | ICD-10-CM

## 2020-09-08 NOTE — Progress Notes (Signed)
Pt graduates today with the completion of 22 exercise sessions.  Pt tolerated participating in cardiac rehab and was  a delight to work with.  Medication reconciliation completed, asked pt to clarify his dose for his Toprol XL.  Mansour will check his bottle and let me know. Pt plans to join a gym to keep up with his exercise on a consistent basis. Cherre Huger, BSN Cardiac and Training and development officer

## 2020-10-30 DIAGNOSIS — K219 Gastro-esophageal reflux disease without esophagitis: Secondary | ICD-10-CM | POA: Diagnosis not present

## 2020-10-30 DIAGNOSIS — D126 Benign neoplasm of colon, unspecified: Secondary | ICD-10-CM | POA: Diagnosis not present

## 2020-10-30 DIAGNOSIS — C61 Malignant neoplasm of prostate: Secondary | ICD-10-CM | POA: Diagnosis not present

## 2020-10-30 DIAGNOSIS — G479 Sleep disorder, unspecified: Secondary | ICD-10-CM | POA: Diagnosis not present

## 2020-10-30 DIAGNOSIS — R42 Dizziness and giddiness: Secondary | ICD-10-CM | POA: Diagnosis not present

## 2020-10-30 DIAGNOSIS — Z9889 Other specified postprocedural states: Secondary | ICD-10-CM | POA: Diagnosis not present

## 2020-10-30 DIAGNOSIS — I10 Essential (primary) hypertension: Secondary | ICD-10-CM | POA: Diagnosis not present

## 2020-10-30 DIAGNOSIS — R7301 Impaired fasting glucose: Secondary | ICD-10-CM | POA: Diagnosis not present

## 2020-10-30 DIAGNOSIS — R0789 Other chest pain: Secondary | ICD-10-CM | POA: Diagnosis not present

## 2020-10-30 DIAGNOSIS — E785 Hyperlipidemia, unspecified: Secondary | ICD-10-CM | POA: Diagnosis not present

## 2020-10-30 DIAGNOSIS — F39 Unspecified mood [affective] disorder: Secondary | ICD-10-CM | POA: Diagnosis not present

## 2020-10-30 DIAGNOSIS — Z23 Encounter for immunization: Secondary | ICD-10-CM | POA: Diagnosis not present

## 2020-10-30 DIAGNOSIS — D696 Thrombocytopenia, unspecified: Secondary | ICD-10-CM | POA: Diagnosis not present

## 2020-10-30 DIAGNOSIS — R7989 Other specified abnormal findings of blood chemistry: Secondary | ICD-10-CM | POA: Diagnosis not present

## 2020-11-17 DIAGNOSIS — E785 Hyperlipidemia, unspecified: Secondary | ICD-10-CM | POA: Diagnosis not present

## 2020-12-18 ENCOUNTER — Other Ambulatory Visit: Payer: Self-pay | Admitting: Thoracic Surgery (Cardiothoracic Vascular Surgery)

## 2020-12-18 DIAGNOSIS — Z9889 Other specified postprocedural states: Secondary | ICD-10-CM

## 2020-12-19 ENCOUNTER — Encounter: Payer: PPO | Admitting: Thoracic Surgery (Cardiothoracic Vascular Surgery)

## 2020-12-26 ENCOUNTER — Other Ambulatory Visit: Payer: Self-pay

## 2020-12-26 ENCOUNTER — Encounter: Payer: Self-pay | Admitting: Thoracic Surgery (Cardiothoracic Vascular Surgery)

## 2020-12-26 ENCOUNTER — Institutional Professional Consult (permissible substitution): Payer: PPO | Admitting: Thoracic Surgery (Cardiothoracic Vascular Surgery)

## 2020-12-26 ENCOUNTER — Ambulatory Visit
Admission: RE | Admit: 2020-12-26 | Discharge: 2020-12-26 | Disposition: A | Discharge status: Home / Self Care | Payer: PPO | Source: Ambulatory Visit | Attending: Thoracic Surgery (Cardiothoracic Vascular Surgery) | Admitting: Thoracic Surgery (Cardiothoracic Vascular Surgery)

## 2020-12-26 VITALS — BP 116/75 | HR 71 | Resp 20 | Ht 68.0 in | Wt 152.0 lb

## 2020-12-26 DIAGNOSIS — Z952 Presence of prosthetic heart valve: Secondary | ICD-10-CM | POA: Diagnosis not present

## 2020-12-26 DIAGNOSIS — M954 Acquired deformity of chest and rib: Secondary | ICD-10-CM | POA: Diagnosis not present

## 2020-12-26 DIAGNOSIS — Z9889 Other specified postprocedural states: Secondary | ICD-10-CM

## 2020-12-26 DIAGNOSIS — J984 Other disorders of lung: Secondary | ICD-10-CM | POA: Diagnosis not present

## 2020-12-26 NOTE — Progress Notes (Signed)
[address]       ,[place] [address]             [phone]       HPI:[name] returns for chest wall deformity post mitral valve repair.  Tyler [name] is a 70 year old gentleman who had a minimally invasive mitral valve repair by Dr. Roxy Manns back in February 2022.  He did well postoperatively.  He noted a bulge in the chest wall with coughing.  Originally it started out about the size of a quarter but now has enlarged.  He is not having any significant pain associated with it but is rather anxious about it.  Past Medical History:  Diagnosis Date   Adenomatous polyp of colon 02/2004   Anemia    Anxiety    Arthritis    BPH (benign prostatic hyperplasia)    Colonic varices    Degenerative joint disease    Depression    Depression    Fatty liver    GERD (gastroesophageal reflux disease)    Heart murmur    Hemorrhoids    Hyperlipidemia    Hypertension    IBS (irritable bowel syndrome)    Prostate cancer ([place]) 2011   S/P minimally invasive mitral valve repair 04/11/2020   Complex valvuloplasty including artificial Gore-tex neochord placement x8 with suture plication of posterior commissure and 30 mm Sorin Memo 3D ring annuloplasty via right mini thoracotomy approach   Thrombocytopenia (HCC)    Thyroid nodule     Current Outpatient Medications  Medication Sig Dispense Refill   amoxicillin (AMOXIL) 500 MG tablet Take 4 tablets 30-60 minutes before dental procedures 4 tablet 1   buPROPion (WELLBUTRIN XL) 150 MG 24 hr tablet Take 150 mg by mouth in the morning.      dicyclomine (BENTYL) 10 MG capsule TAKE 1 CAPSULE BY MOUTH 3 TIMES DAILY AS NEEDED FOR SPASMS (ABD PAIN AND CRAMPING). (Patient taking differently: Take 10 mg by mouth daily as needed for spasms (or abdominal pain/cramping).) 90 capsule 3   escitalopram (LEXAPRO) 5 MG tablet Take 5 mg by mouth every evening.      hydroxypropyl methylcellulose / hypromellose (ISOPTO TEARS / GONIOVISC) 2.5 %  ophthalmic solution Place 1 drop into both eyes 3 (three) times daily as needed (for dryness).      metoprolol succinate (TOPROL-XL) 25 MG 24 hr tablet Take 0.5 tablets (12.5 mg total) by mouth daily. (Patient taking differently: Take 25 mg by mouth daily.) 45 tablet 3   pantoprazole (PROTONIX) 40 MG tablet TAKE 1 TABLET BY MOUTH EVERY DAY (Patient taking differently: Take 40 mg by mouth daily before breakfast.) 90 tablet 0   sildenafil (VIAGRA) 100 MG tablet Take 100 mg by mouth daily as needed for erectile dysfunction.     traZODone (DESYREL) 50 MG tablet Take 50 mg by mouth at bedtime.     lisinopril (ZESTRIL) 2.5 MG tablet Take 1 tablet (2.5 mg total) by mouth daily. 90 tablet 3   No current facility-administered medications for this visit.    Physical Exam BP 116/75 (BP Location: Right Arm, Patient Position: Sitting)   Pulse 71   Resp 20   Ht 5\' 8"  (1.727 m)   Wt 152 lb (68.9 kg)   SpO2 98% Comment: RA  BMI 23.40 kg/m  70 year old man in no acute distress Alert and oriented x3 with no focal deficits Lungs clear bilaterally Cardiac regular rate and rhythm, no murmur Incision skin well-healed.  Chest wall  defect in third interspace, bulges with cough  Diagnostic Tests: I reviewed his chest x-ray.  No worrisome findings.  Impression: Tyler [name] is a 70 year old gentleman who had a mitral valve repair via mini thoracotomy and back in February.  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  He has minimal discomfort.  I reassured him that this hernia is not of any major concern.  The indication for fixing it with the if he has significant discomfort associated with it.  The procedure would involve making an incision directly over the site and placing prosthetic material to close the defect.    He is not having significant pain associated with it currently.  Plan: He will call if he wishes to have the chest wall defect repaired.  Tyler Nakayama, MD [facility] [phone]

## 2021-01-31 DIAGNOSIS — E785 Hyperlipidemia, unspecified: Secondary | ICD-10-CM | POA: Diagnosis not present

## 2021-01-31 DIAGNOSIS — I10 Essential (primary) hypertension: Secondary | ICD-10-CM | POA: Diagnosis not present

## 2021-01-31 DIAGNOSIS — C61 Malignant neoplasm of prostate: Secondary | ICD-10-CM | POA: Diagnosis not present

## 2021-03-07 DIAGNOSIS — D509 Iron deficiency anemia, unspecified: Secondary | ICD-10-CM | POA: Diagnosis not present

## 2021-03-07 DIAGNOSIS — E785 Hyperlipidemia, unspecified: Secondary | ICD-10-CM | POA: Diagnosis not present

## 2021-03-07 DIAGNOSIS — F3341 Major depressive disorder, recurrent, in partial remission: Secondary | ICD-10-CM | POA: Diagnosis not present

## 2021-03-07 DIAGNOSIS — I2510 Atherosclerotic heart disease of native coronary artery without angina pectoris: Secondary | ICD-10-CM | POA: Diagnosis not present

## 2021-03-07 DIAGNOSIS — I700 Atherosclerosis of aorta: Secondary | ICD-10-CM | POA: Diagnosis not present

## 2021-03-07 DIAGNOSIS — H9190 Unspecified hearing loss, unspecified ear: Secondary | ICD-10-CM | POA: Diagnosis not present

## 2021-03-07 DIAGNOSIS — E041 Nontoxic single thyroid nodule: Secondary | ICD-10-CM | POA: Diagnosis not present

## 2021-03-07 DIAGNOSIS — G4700 Insomnia, unspecified: Secondary | ICD-10-CM | POA: Diagnosis not present

## 2021-03-07 DIAGNOSIS — I10 Essential (primary) hypertension: Secondary | ICD-10-CM | POA: Diagnosis not present

## 2021-03-07 DIAGNOSIS — H259 Unspecified age-related cataract: Secondary | ICD-10-CM | POA: Diagnosis not present

## 2021-03-07 DIAGNOSIS — D6869 Other thrombophilia: Secondary | ICD-10-CM | POA: Diagnosis not present

## 2021-03-07 DIAGNOSIS — H04123 Dry eye syndrome of bilateral lacrimal glands: Secondary | ICD-10-CM | POA: Diagnosis not present

## 2021-03-12 ENCOUNTER — Encounter: Payer: Self-pay | Admitting: Cardiovascular Disease

## 2021-03-14 NOTE — Progress Notes (Signed)
Cardiology Office Note:   Date:  03/15/2021  NAME:  Tyler [name]    MRN: [id number] DOB:  1950-12-09   PCP:  Prince Solian, MD  Cardiologist:  Evalina Field, MD  Electrophysiologist:  None   Referring MD: Prince Solian, MD   Chief Complaint  Patient presents with   Follow-up   History of Present Illness:   Tyler [name] is a 71 y.o. male with a hx of severe mitral regurgitation status post mitral valve repair, systolic heart failure EF 45%, hypertension who presents for follow-up.  He reports he is doing well.  BP 108/80.  No dizziness or lightheadedness.  Walking of 35 minutes/day.  No chest pain or trouble breathing.  No lower extremity edema.  EKG is unchanged.  He apparently has suffered lung herniation.  Suspect this is secondary to his minimally invasive heart surgery last year.  He reports the area of herniation is increasing.  It is painful at times.  He was evaluated by cardiac surgery.  I recommend to hold has no significant symptoms at that time.  Symptoms have worsened.  He reports he will reevaluate need for surgery.  From a cardiac standpoint he overall is doing quite well.  No limitations or issues today.  We discussed taking a baby aspirin given his mitral valve repair.  He is okay to do this.  He will take antibiotics before any dental work.  He was he has procedures.  Problem List 1. HTN 2. Colonic varices  3. Severe MR -A3 prolapse  -Complex valvuloplasty/30 mm annuloplasty 04/11/2020 -no CAD 4. Subclinical LV dysfunction  -LVEF 40-45% after MV repair   Past Medical History: Past Medical History:  Diagnosis Date   Adenomatous polyp of colon 02/2004   Anemia    Anxiety    Arthritis    BPH (benign prostatic hyperplasia)    Colonic varices    Degenerative joint disease    Depression    Depression    Fatty liver    GERD (gastroesophageal reflux disease)    Heart murmur    Hemorrhoids    Hyperlipidemia    Hypertension    IBS (irritable bowel  syndrome)    Prostate cancer ([place]) 2011   S/P minimally invasive mitral valve repair 04/11/2020   Complex valvuloplasty including artificial Gore-tex neochord placement x8 with suture plication of posterior commissure and 30 mm Sorin Memo 3D ring annuloplasty via right mini thoracotomy approach   Thrombocytopenia (HCC)    Thyroid nodule     Past Surgical History: Past Surgical History:  Procedure Laterality Date   BUBBLE STUDY  12/22/2019   Procedure: BUBBLE STUDY;  Surgeon: Geralynn Rile, MD;  Location: [facility];  Service: Cardiovascular;;   CARDIAC CATHETERIZATION     COLONOSCOPY     MITRAL VALVE REPAIR Right 04/11/2020   Procedure: MINIMALLY INVASIVE MITRAL VALVE REPAIR (MVR) USING MEMO 4D RING SIZE 30MM;  Surgeon: Rexene Alberts, MD;  Location: [facility];  Service: Open Heart Surgery;  Laterality: Right;   PROSTATECTOMY     RIGHT/LEFT HEART CATH AND CORONARY ANGIOGRAPHY N/A 12/22/2019   Procedure: RIGHT/LEFT HEART CATH AND CORONARY ANGIOGRAPHY;  Surgeon: [name], MD;  Location: [facility];  Service: Cardiovascular;  Laterality: N/A;   TEE WITHOUT CARDIOVERSION N/A 12/22/2019   Procedure: TRANSESOPHAGEAL ECHOCARDIOGRAM (TEE);  Surgeon: Geralynn Rile, MD;  Location: [facility];  Service: Cardiovascular;  Laterality: N/A;   TEE WITHOUT CARDIOVERSION N/A 04/11/2020   Procedure: TRANSESOPHAGEAL ECHOCARDIOGRAM (TEE);  Surgeon: Rexene Alberts, MD;  Location: [facility];  Service: Open Heart Surgery;  Laterality: N/A;   TONSILLECTOMY     TOOTH EXTRACTION  03/07/2020   TYMPANOPLASTY  01/2016    Current Medications: Current Meds  Medication Sig   amoxicillin (AMOXIL) 500 MG tablet Take 4 tablets 30-60 minutes before dental procedures   aspirin EC 81 MG tablet Take 1 tablet (81 mg total) by mouth daily. Swallow whole.   buPROPion (WELLBUTRIN XL) 150 MG 24 hr tablet Take 150 mg by mouth in the morning.    dicyclomine (BENTYL) 10 MG capsule TAKE 1 CAPSULE BY  MOUTH 3 TIMES DAILY AS NEEDED FOR SPASMS (ABD PAIN AND CRAMPING). (Patient taking differently: Take 10 mg by mouth daily as needed for spasms (or abdominal pain/cramping).)   escitalopram (LEXAPRO) 5 MG tablet Take 5 mg by mouth every evening.    hydroxypropyl methylcellulose / hypromellose (ISOPTO TEARS / GONIOVISC) 2.5 % ophthalmic solution Place 1 drop into both eyes 3 (three) times daily as needed (for dryness).    lisinopril (ZESTRIL) 2.5 MG tablet Take 1 tablet (2.5 mg total) by mouth daily.   metoprolol succinate (TOPROL-XL) 25 MG 24 hr tablet Take 0.5 tablets (12.5 mg total) by mouth daily. (Patient taking differently: Take 25 mg by mouth daily.)   pantoprazole (PROTONIX) 40 MG tablet TAKE 1 TABLET BY MOUTH EVERY DAY (Patient taking differently: Take 40 mg by mouth daily before breakfast.)   sildenafil (VIAGRA) 100 MG tablet Take 100 mg by mouth daily as needed for erectile dysfunction.   traZODone (DESYREL) 50 MG tablet Take 50 mg by mouth at bedtime.     Allergies:    Patient has no known allergies.   Social History: Social History   Socioeconomic History   Marital status: Married    Spouse name: Not on file   Number of children: 3   Years of education: 18   Highest education level: Master's degree (e.g., MA, MS, MEng, MEd, MSW, MBA)  Occupational History   Occupation: Retired  Tobacco Use   Smoking status: Never   Smokeless tobacco: Never  Vaping Use   Vaping Use: Never used  Substance and Sexual Activity   Alcohol use: Yes    Comment: occ   Drug use: No   Sexual activity: Not on file  Other Topics Concern   Not on file  Social History Narrative   Not on file   Social Determinants of Health   Financial Resource Strain: Not on file  Food Insecurity: Not on file  Transportation Needs: Not on file  Physical Activity: Not on file  Stress: Not on file  Social Connections: Not on file    Family History: The patient's family history includes Colon polyps in his  father; Depression in an other family member; Heart disease in his father and mother; Stroke in his brother. There is no history of Colon cancer, Esophageal cancer, Stomach cancer, or Celiac disease.  ROS:   All other ROS reviewed and negative. Pertinent positives noted in the HPI.     EKGs/Labs/Other Studies Reviewed:   The following studies were personally reviewed by me today:  EKG:  EKG is ordered today.  The ekg ordered today demonstrates normal sinus rhythm, left axis deviation, incomplete right bundle branch block, no acute ischemic changes or evidence of infarction, and was personally reviewed by me.   TTE 05/25/2020  1. Left ventricular ejection fraction, by estimation, is 40 to 45%. The  left ventricle has mildly  decreased function. The left ventricle has no  regional wall motion abnormalities. Left ventricular diastolic function  could not be evaluated.   2. Right ventricular systolic function is normal. The right ventricular  size is normal. There is normal pulmonary artery systolic pressure. The  estimated right ventricular systolic pressure is 78.2 mmHg.   3. Left atrial size was mild to moderately dilated.   4. The mitral valve has been repaired/replaced. No evidence of mitral  valve regurgitation. No evidence of mitral stenosis. The mean mitral valve  gradient is 4.0 mmHg with average heart rate of 80 bpm. There is a  prosthetic annuloplasty ring present in  the mitral position. Procedure Date: 04/11/2020.   5. Tricuspid valve regurgitation is mild to moderate.   6. The aortic valve is normal in structure. Aortic valve regurgitation is  not visualized. No aortic stenosis is present.   7. The inferior vena cava is normal in size with greater than 50%  respiratory variability, suggesting right atrial pressure of 3 mmHg.   Recent Labs: 04/07/2020: ALT 17 04/14/2020: Magnesium 1.8 04/28/2020: BUN 15; Creatinine, Ser 0.97; Hemoglobin 12.8; Platelets 163; Potassium 4.7; Sodium  142   Recent Lipid Panel No results found for: CHOL, TRIG, HDL, CHOLHDL, VLDL, LDLCALC, LDLDIRECT  Physical Exam:   VS:  BP 108/80 (BP Location: Left Arm, Patient Position: Sitting, Cuff Size: Normal)    Pulse 66    Ht 5\' 9"  (1.753 m)    Wt 152 lb (68.9 kg)    BMI 22.45 kg/m    Wt Readings from Last 3 Encounters:  03/15/21 152 lb (68.9 kg)  12/26/20 152 lb (68.9 kg)  09/01/20 151 lb 14.4 oz (68.9 kg)    General: Well nourished, well developed, in no acute distress Head: Atraumatic, normal size  Eyes: PEERLA, EOMI  Neck: Supple, no JVD Endocrine: No thryomegaly Cardiac: Normal S1, S2; RRR; no murmurs, rubs, or gallops Lungs: Clear to auscultation bilaterally, no wheezing, rhonchi or rales  Abd: Soft, nontender, no hepatomegaly  Ext: No edema, pulses 2+ Musculoskeletal: No deformities, BUE and BLE strength normal and equal Skin: Warm and dry, no rashes   Neuro: Alert and oriented to person, place, time, and situation, CNII-XII grossly intact, no focal deficits  Psych: Normal mood and affect   ASSESSMENT:   Tyler [name] is a 71 y.o. male who presents for the following: 1. Nonrheumatic mitral valve regurgitation   2. S/P minimally invasive mitral valve repair   3. SBE (subacute bacterial endocarditis) prophylaxis candidate   4. Chronic systolic heart failure ([place])   5. Herniation of lung     PLAN:   1. Nonrheumatic mitral valve regurgitation 2. S/P minimally invasive mitral valve repair 3. SBE (subacute bacterial endocarditis) prophylaxis candidate -Status post mitral valve annuloplasty 1 04/11/2020 with Dr. Roxy Manns.  Doing well since surgery.  Echocardiogram shows stable repair.  EF is 45% which is expected after mitral valve surgery.  Denies any symptoms of heart failure. -Recommended to restart aspirin 81 mg daily.  He will continue this. -No CAD on heart cath. -He will continue with SBE prophylaxis for dental work and procedures.  He will let us know if he has any  procedure.  4. Chronic systolic heart failure (HCC) -Ejection fraction 45%.  No evidence of volume overload.  Doing quite well from a cardiac standpoint.  He is on metoprolol succinate 25 mg daily.  He is on lisinopril 2.5 mg daily.  No evidence of volume overload.  This is  all secondary to mitral valve regurgitation.  Unfortunately EF may not recover.  Despite this he has no symptoms and is doing well.  We will check an echo periodically.  I see no need to do this given his lack of symptoms today in office.  5. Herniation of lung -He does have herniation of his right lung.  This is related to prior minimally invasive cardiac surgery.  Appears to be worsening.  Describes some pain with it.  He will reevaluate with cardiac surgery to have this repaired.     Disposition: Return in about 1 year (around 03/15/2022).  Medication Adjustments/Labs and Tests Ordered: Current medicines are reviewed at length with the patient today.  Concerns regarding medicines are outlined above.  Orders Placed This Encounter  Procedures   EKG 12-Lead   Meds ordered this encounter  Medications   aspirin EC 81 MG tablet    Sig: Take 1 tablet (81 mg total) by mouth daily. Swallow whole.    Dispense:  90 tablet    Refill:  3    Patient Instructions  Medication Instructions:  Your physician has recommended you make the following change in your medication: START: Aspirin 81 mg once daily *If you need a refill on your cardiac medications before your next appointment, please call your pharmacy*   Lab Work: None If you have labs (blood work) drawn today and your tests are completely normal, you will receive your results only by: [facility] (if you have MyChart) OR A paper copy in the mail If you have any lab test that is abnormal or we need to change your treatment, we will call you to review the results.   Testing/Procedures: None   Follow-Up: At  [facility], you and your health needs are our  priority.  As part of our continuing mission to provide you with exceptional heart care, we have created designated Provider Care Teams.  These Care Teams include your primary Cardiologist (physician) and Advanced Practice Providers (APPs -  Physician Assistants and Nurse Practitioners) who all work together to provide you with the care you need, when you need it.  We recommend signing up for the patient portal called "MyChart".  Sign up information is provided on this After Visit Summary.  MyChart is used to connect with patients for Virtual Visits (Telemedicine).  Patients are able to view lab/test results, encounter notes, upcoming appointments, etc.  Non-urgent messages can be sent to your provider as well.   To learn more about what you can do with MyChart, go to [email or web address].    Your next appointment:   1 year(s)  The format for your next appointment:   In Person  Provider:   Evalina Field, MD     Other Instructions     Time Spent with Patient: I have spent a total of 35 minutes with patient reviewing hospital notes, telemetry, EKGs, labs and examining the patient as well as establishing an assessment and plan that was discussed with the patient.  > 50% of time was spent in direct patient care.  Signed, Addison Naegeli. Audie Box, MD, [facility]  [address], [place], [place] [address] [phone]  03/15/2021 9:19 AM

## 2021-03-15 ENCOUNTER — Ambulatory Visit: Payer: PPO | Admitting: Cardiovascular Disease

## 2021-03-15 ENCOUNTER — Other Ambulatory Visit: Payer: Self-pay

## 2021-03-15 ENCOUNTER — Encounter: Payer: Self-pay | Admitting: Cardiovascular Disease

## 2021-03-15 VITALS — BP 108/80 | HR 66 | Ht 69.0 in | Wt 152.0 lb

## 2021-03-15 DIAGNOSIS — I340 Nonrheumatic mitral (valve) insufficiency: Secondary | ICD-10-CM

## 2021-03-15 DIAGNOSIS — J984 Other disorders of lung: Secondary | ICD-10-CM | POA: Diagnosis not present

## 2021-03-15 DIAGNOSIS — I5022 Chronic systolic (congestive) heart failure: Secondary | ICD-10-CM | POA: Diagnosis not present

## 2021-03-15 DIAGNOSIS — Z9889 Other specified postprocedural states: Secondary | ICD-10-CM

## 2021-03-15 DIAGNOSIS — Z298 Encounter for other specified prophylactic measures: Secondary | ICD-10-CM | POA: Diagnosis not present

## 2021-03-15 MED ORDER — ASPIRIN EC 81 MG PO TBEC: 81.0000 mg | DELAYED_RELEASE_TABLET | Freq: Every day | ORAL | 3 refills | Status: AC

## 2021-03-15 NOTE — Patient Instructions (Signed)
Medication Instructions:  Your physician has recommended you make the following change in your medication: START: Aspirin 81 mg once daily *If you need a refill on your cardiac medications before your next appointment, please call your pharmacy*   Lab Work: None If you have labs (blood work) drawn today and your tests are completely normal, you will receive your results only by: [facility] (if you have MyChart) OR A paper copy in the mail If you have any lab test that is abnormal or we need to change your treatment, we will call you to review the results.   Testing/Procedures: None   Follow-Up: At [facility], you and your health needs are our priority.  As part of our continuing mission to provide you with exceptional heart care, we have created designated Provider Care Teams.  These Care Teams include your primary Cardiologist (physician) and Advanced Practice Providers (APPs -  Physician Assistants and Nurse Practitioners) who all work together to provide you with the care you need, when you need it.  We recommend signing up for the patient portal called "MyChart".  Sign up information is provided on this After Visit Summary.  MyChart is used to connect with patients for Virtual Visits (Telemedicine).  Patients are able to view lab/test results, encounter notes, upcoming appointments, etc.  Non-urgent messages can be sent to your provider as well.   To learn more about what you can do with MyChart, go to [email or web address].    Your next appointment:   1 year(s)  The format for your next appointment:   In Person  Provider:   Evalina Field, MD     Other Instructions

## 2021-03-16 DIAGNOSIS — E785 Hyperlipidemia, unspecified: Secondary | ICD-10-CM | POA: Diagnosis not present

## 2021-03-16 DIAGNOSIS — K219 Gastro-esophageal reflux disease without esophagitis: Secondary | ICD-10-CM | POA: Diagnosis not present

## 2021-03-16 DIAGNOSIS — D696 Thrombocytopenia, unspecified: Secondary | ICD-10-CM | POA: Diagnosis not present

## 2021-03-16 DIAGNOSIS — R7301 Impaired fasting glucose: Secondary | ICD-10-CM | POA: Diagnosis not present

## 2021-03-16 DIAGNOSIS — I10 Essential (primary) hypertension: Secondary | ICD-10-CM | POA: Diagnosis not present

## 2021-03-16 DIAGNOSIS — D126 Benign neoplasm of colon, unspecified: Secondary | ICD-10-CM | POA: Diagnosis not present

## 2021-03-16 DIAGNOSIS — Z9889 Other specified postprocedural states: Secondary | ICD-10-CM | POA: Diagnosis not present

## 2021-03-16 DIAGNOSIS — C61 Malignant neoplasm of prostate: Secondary | ICD-10-CM | POA: Diagnosis not present

## 2021-03-16 DIAGNOSIS — F39 Unspecified mood [affective] disorder: Secondary | ICD-10-CM | POA: Diagnosis not present

## 2021-03-16 DIAGNOSIS — R251 Tremor, unspecified: Secondary | ICD-10-CM | POA: Diagnosis not present

## 2021-03-16 DIAGNOSIS — R0789 Other chest pain: Secondary | ICD-10-CM | POA: Diagnosis not present

## 2021-03-22 ENCOUNTER — Other Ambulatory Visit: Payer: Self-pay | Admitting: Thoracic Surgery (Cardiothoracic Vascular Surgery)

## 2021-03-22 DIAGNOSIS — M954 Acquired deformity of chest and rib: Secondary | ICD-10-CM

## 2021-03-27 ENCOUNTER — Other Ambulatory Visit: Payer: Self-pay

## 2021-03-27 ENCOUNTER — Ambulatory Visit
Admission: RE | Admit: 2021-03-27 | Discharge: 2021-03-27 | Disposition: A | Discharge status: Home / Self Care | Payer: PPO | Source: Ambulatory Visit | Attending: Thoracic Surgery (Cardiothoracic Vascular Surgery) | Admitting: Thoracic Surgery (Cardiothoracic Vascular Surgery)

## 2021-03-27 DIAGNOSIS — I700 Atherosclerosis of aorta: Secondary | ICD-10-CM | POA: Diagnosis not present

## 2021-03-27 DIAGNOSIS — D71 Functional disorders of polymorphonuclear neutrophils: Secondary | ICD-10-CM | POA: Diagnosis not present

## 2021-03-27 DIAGNOSIS — I2510 Atherosclerotic heart disease of native coronary artery without angina pectoris: Secondary | ICD-10-CM | POA: Diagnosis not present

## 2021-03-27 DIAGNOSIS — J8410 Pulmonary fibrosis, unspecified: Secondary | ICD-10-CM | POA: Diagnosis not present

## 2021-03-27 DIAGNOSIS — M954 Acquired deformity of chest and rib: Secondary | ICD-10-CM

## 2021-03-27 MED ORDER — IOPAMIDOL (ISOVUE-300) INJECTION 61%
75.0000 mL | Freq: Once | INTRAVENOUS | Status: AC | PRN
  Administered 2021-03-27: 75 mL via INTRAVENOUS

## 2021-03-28 ENCOUNTER — Encounter: Payer: Self-pay | Admitting: Gastroenterology

## 2021-03-28 ENCOUNTER — Ambulatory Visit: Payer: PPO | Admitting: Thoracic Surgery (Cardiothoracic Vascular Surgery)

## 2021-03-28 VITALS — BP 124/78 | HR 72 | Resp 20 | Ht 69.0 in | Wt 152.0 lb

## 2021-03-28 DIAGNOSIS — Z9889 Other specified postprocedural states: Secondary | ICD-10-CM | POA: Diagnosis not present

## 2021-03-28 DIAGNOSIS — M954 Acquired deformity of chest and rib: Secondary | ICD-10-CM

## 2021-03-28 NOTE — Progress Notes (Signed)
[address]       [place] [address]             [phone]      HPI: Tyler [name] returns regarding his right chest wall hernia.    Tyler [name] is a 71 year old man who had minimally invasive mitral valve repair by Dr. Roxy [name] in February 2022.  He developed a bulge in the chest wall with coughing at the operative site.  Originally, it started out about the size of a quarter but had enlarged.  I saw him in October.  He was not having any pain.    Over time the hernia has continued to enlarge.  He still does not have any pain associated with it.  He is not having any shortness of breath.  He now wishes to have it repaired.  Past Medical History:  Diagnosis Date   Adenomatous polyp of colon 02/2004   Anemia    Anxiety    Arthritis    BPH (benign prostatic hyperplasia)    Colonic varices    Degenerative joint disease    Depression    Depression    Fatty liver    GERD (gastroesophageal reflux disease)    Heart murmur    Hemorrhoids    Hyperlipidemia    Hypertension    IBS (irritable bowel syndrome)    Prostate cancer ([place]) 2011   S/P minimally invasive mitral valve repair 04/11/2020   Complex valvuloplasty including artificial Gore-tex neochord placement x8 with suture plication of posterior commissure and 30 mm Sorin Memo 3D ring annuloplasty via right mini thoracotomy approach   Thrombocytopenia ([place])    Thyroid nodule    Past Surgical History:  Procedure Laterality Date   BUBBLE STUDY  12/22/2019   Procedure: BUBBLE STUDY;  Surgeon: Geralynn Rile, MD;  Location: [facility];  Service: Cardiovascular;;   CARDIAC CATHETERIZATION     COLONOSCOPY     MITRAL VALVE REPAIR Right 04/11/2020   Procedure: MINIMALLY INVASIVE MITRAL VALVE REPAIR (MVR) USING MEMO 4D RING SIZE 30MM;  Surgeon: Tyler Alberts, MD;  Location: [facility];  Service: Open Heart Surgery;  Laterality: Right;   PROSTATECTOMY     RIGHT/LEFT HEART CATH AND CORONARY ANGIOGRAPHY N/A  12/22/2019   Procedure: RIGHT/LEFT HEART CATH AND CORONARY ANGIOGRAPHY;  Surgeon: [name], MD;  Location: [facility];  Service: Cardiovascular;  Laterality: N/A;   TEE WITHOUT CARDIOVERSION N/A 12/22/2019   Procedure: TRANSESOPHAGEAL ECHOCARDIOGRAM (TEE);  Surgeon: Geralynn Rile, MD;  Location: [facility];  Service: Cardiovascular;  Laterality: N/A;   TEE WITHOUT CARDIOVERSION N/A 04/11/2020   Procedure: TRANSESOPHAGEAL ECHOCARDIOGRAM (TEE);  Surgeon: Tyler Alberts, MD;  Location: [facility];  Service: Open Heart Surgery;  Laterality: N/A;   TONSILLECTOMY     TOOTH EXTRACTION  03/07/2020   TYMPANOPLASTY  01/2016      Current Outpatient Medications  Medication Sig Dispense Refill   amoxicillin (AMOXIL) 500 MG tablet Take 4 tablets 30-60 minutes before dental procedures 4 tablet 1   aspirin EC 81 MG tablet Take 1 tablet (81 mg total) by mouth daily. Swallow whole. 90 tablet 3   buPROPion (WELLBUTRIN XL) 150 MG 24 hr tablet Take 300 mg by mouth in the morning.     dicyclomine (BENTYL) 10 MG capsule TAKE 1 CAPSULE BY MOUTH 3 TIMES DAILY AS NEEDED FOR SPASMS (ABD PAIN AND CRAMPING). (Patient taking differently: Take 10 mg by mouth daily as needed for spasms (  or abdominal pain/cramping).) 90 capsule 3   escitalopram (LEXAPRO) 5 MG tablet Take 5 mg by mouth every evening.      hydroxypropyl methylcellulose / hypromellose (ISOPTO TEARS / GONIOVISC) 2.5 % ophthalmic solution Place 1 drop into both eyes 3 (three) times daily as needed (for dryness).      metoprolol succinate (TOPROL-XL) 25 MG 24 hr tablet Take 0.5 tablets (12.5 mg total) by mouth daily. (Patient taking differently: Take 25 mg by mouth daily.) 45 tablet 3   pantoprazole (PROTONIX) 40 MG tablet TAKE 1 TABLET BY MOUTH EVERY DAY (Patient taking differently: Take 40 mg by mouth daily before breakfast.) 90 tablet 0   sildenafil (VIAGRA) 100 MG tablet Take 100 mg by mouth daily as needed for erectile dysfunction.      traZODone (DESYREL) 50 MG tablet Take 50 mg by mouth at bedtime.     lisinopril (ZESTRIL) 2.5 MG tablet Take 1 tablet (2.5 mg total) by mouth daily. 90 tablet 3   No current facility-administered medications for this visit.    Physical Exam BP 124/78 (BP Location: Left Arm, Patient Position: Sitting)    Pulse 72    Resp 20    Ht 5\' 9"  (1.753 m)    Wt 152 lb (68.9 kg)    SpO2 96% Comment: RA   BMI 22.45 kg/m  Well-appearing 72 year old man in no acute distress Alert and oriented x3 with no focal motor deficits Cardiac regular rate and rhythm no murmur Lungs clear bilaterally Chest wall defect palpable in third interspace on the right anteriorly, lung bulges with coughing Abdomen soft nontender No edema  Diagnostic Tests: CT CHEST WITH CONTRAST   TECHNIQUE: Multidetector CT imaging of the chest was performed during intravenous contrast administration.   RADIATION DOSE REDUCTION: This exam was performed according to the departmental dose-optimization program which includes automated exposure control, adjustment of the mA and/or kV according to patient size and/or use of iterative reconstruction technique.   CONTRAST:  56mL ISOVUE-300 IOPAMIDOL (ISOVUE-300) INJECTION 61%   COMPARISON:  Chest CTA 01/12/2020.   FINDINGS: Cardiovascular: Heart size is normal. There is no significant pericardial fluid, thickening or pericardial calcification. There is aortic atherosclerosis, as well as atherosclerosis of the great vessels of the mediastinum and the coronary arteries, including calcified atherosclerotic plaque in the left anterior descending, left circumflex and right coronary arteries. Status post mitral annuloplasty.   Mediastinum/Nodes: No pathologically enlarged mediastinal or hilar lymph nodes. Esophagus is unremarkable in appearance. No axillary lymphadenopathy.   Lungs/Pleura: Small calcified granulomas are noted in the right lower lobe. No other suspicious appearing  pulmonary nodules or masses are noted. No acute consolidative airspace disease. No pleural effusions. Architectural distortion in the periphery of the right upper and middle lobes, favored to reflect areas of chronic post infectious or inflammatory scarring, new compared to prior study from 01/12/2020.   Upper Abdomen: Diffuse low attenuation throughout the visualized hepatic parenchyma, indicative of hepatic steatosis. Multiple nonobstructive calculi are noted within the collecting systems of both kidneys measuring up to 3 mm. Low-attenuation lesions in both kidneys, compatible with simple cysts, measuring up to 2.7 cm in the upper pole of the right kidney. Other subcentimeter low-attenuation lesions in the right kidney, too small to characterize, but statistically likely to represent tiny cysts.   Musculoskeletal: There are no acute displaced fractures or aggressive appearing lytic or blastic lesions noted in the visualized portions of the skeleton.   IMPRESSION: 1. No acute findings are noted in the chest to  account for the patient's symptoms. 2. Areas of apparent post infectious or inflammatory scarring noted in the periphery of the right upper and right middle lobes. 3. Old granulomatous disease, as above. 4. Aortic atherosclerosis, in addition to three-vessel coronary artery disease. Assessment for potential risk factor modification, dietary therapy or pharmacologic therapy may be warranted, if clinically indicated. 5. Hepatic steatosis. 6. Nonobstructive calculi in the collecting systems of both kidneys measuring up to 3 mm.   Aortic Atherosclerosis (ICD10-I70.0).     Electronically Signed   By: Vinnie Langton M.D.   On: 03/28/2021 09:10 I personally reviewed the CT images.  There is a lung herniation at the third interspace on the right.  Impression: Tyler [name] is a 71 year old man with a history of severe mitral regurgitation status post mitral valve repair via  right thoracotomy about a year ago.    He now has a chest wall defect with a lung hernia.  He remains very anxious about this.  I again explained to him that this was not of any risk to him.  It is not large enough to be causing any type of respiratory compromise.  He wishes to have the hernia repair.  I cautioned him that he could have significant pain afterwards.  I described the postoperative procedure to him.  We would need to make an incision over the area dissect the lung off the musculature and chest wall and place a prosthetic material to cover the defect.  We would need to do that under general anesthesia.  He may or may not need a chest tube postoperatively.  I informed him of the indications, risks, benefits, and alternatives.  He understands the risks include, but are not limited to bleeding, infection (possibly with need for removal of mesh), pain, lung injury, DVT, PE, as well as possibility of other unforeseeable complications.  He and his wife have a trip planned from mid February to early March.  He would like to do it after he returns from that.  Plan: Repair of right anterior chest wall hernia with mesh on 05/14/2021.  Melrose Nakayama, MD [facility] [phone]

## 2021-03-29 ENCOUNTER — Other Ambulatory Visit: Payer: Self-pay | Admitting: *Deleted

## 2021-03-29 ENCOUNTER — Encounter: Payer: Self-pay | Admitting: Thoracic Surgery (Cardiothoracic Vascular Surgery)

## 2021-03-29 ENCOUNTER — Encounter: Payer: Self-pay | Admitting: *Deleted

## 2021-03-29 DIAGNOSIS — M954 Acquired deformity of chest and rib: Secondary | ICD-10-CM

## 2021-04-05 ENCOUNTER — Encounter: Payer: Self-pay | Admitting: Gastroenterology

## 2021-05-09 NOTE — Progress Notes (Signed)
Surgical Instructions ? ? ? Your procedure is scheduled on Monday, May 14, 2021. ? ? Report to [facility] Main Entrance "A" at 05:30 A.M., then check in with the Admitting office. ? Call this number if you have problems the morning of surgery: ? [phone] ? ? If you have any questions prior to your surgery date call [phone]: Open Monday-Friday 8am-4pm ? ? ? Remember: ? Do not eat or drink after midnight the night before your surgery ?  ? Take these medicines the morning of surgery with A SIP OF WATER:  ? ?buPROPion (WELLBUTRIN XL)  ?metoprolol succinate (TOPROL-XL)  ?pantoprazole (PROTONIX)  ?rosuvastatin (CRESTOR) ? ?If needed: ? ?dicyclomine (BENTYL) ?fluticasone (FLONASE)  ?hydroxypropyl methylcellulose / hypromellose (ISOPTO TEARS / GONIOVISC) ? ? ?Follow your surgeon's instructions on when to stop Aspirin.  If no instructions were given by your surgeon then you will need to call the office to get those instructions.    ? ?As of today, STOP taking any Aspirin (unless otherwise instructed by your surgeon) Aleve, Naproxen, Ibuprofen, Motrin, Advil, Goody's, BC's, all herbal medications, fish oil, and all vitamins. ? ? ?The day of surgery: ?         ?Do not wear jewelry  ?Do not wear lotions, powders, colognes, or deodorant. ?Men may shave face and neck. ?Do not bring valuables to the hospital. ? ? ?[facility] is not responsible for any belongings or valuables. .  ? ?Do NOT Smoke (Tobacco/Vaping)  24 hours prior to your procedure ? ?If you use a CPAP at night, you may bring your mask for your overnight stay. ?  ?Contacts, glasses, hearing aids, dentures or partials may not be worn into surgery, please bring cases for these belongings ?  ?For patients admitted to the hospital, discharge time will be determined by your treatment team. ?  ?Patients discharged the day of surgery will not be allowed to drive home, and someone needs to stay with them for 24 hours. ? ?NO VISITORS WILL BE ALLOWED IN PRE-OP WHERE  PATIENTS ARE PREPPED FOR SURGERY.  ONLY 1 SUPPORT PERSON MAY BE PRESENT IN THE WAITING ROOM WHILE YOU ARE IN SURGERY.  IF YOU ARE TO BE ADMITTED, ONCE YOU ARE IN YOUR ROOM YOU WILL BE ALLOWED TWO (2) VISITORS. 1 (ONE) VISITOR MAY STAY OVERNIGHT BUT MUST ARRIVE TO THE ROOM BY 8pm.  Minor children may have two parents present. Special consideration for safety and communication needs will be reviewed on a case by case basis. ? ?Special instructions:   ? ?Oral Hygiene is also important to reduce your risk of infection.  Remember - BRUSH YOUR TEETH THE MORNING OF SURGERY WITH YOUR REGULAR TOOTHPASTE ? ? ?[facility]- Preparing For Surgery ? ?Before surgery, you can play an important role. Because skin is not sterile, your skin needs to be as free of germs as possible. You can reduce the number of germs on your skin by washing with CHG (chlorahexidine gluconate) Soap before surgery.  CHG is an antiseptic cleaner which kills germs and bonds with the skin to continue killing germs even after washing.   ? ? ?Please do not use if you have an allergy to CHG or antibacterial soaps. If your skin becomes reddened/irritated stop using the CHG.  ?Do not shave (including legs and underarms) for at least 48 hours prior to first CHG shower. It is OK to shave your face. ? ?Please follow these instructions carefully. ?  ? ? Shower the [employer] SURGERY and  the MORNING OF SURGERY with CHG Soap.  ? If you chose to wash your hair, wash your hair first as usual with your normal shampoo. After you shampoo, rinse your hair and body thoroughly to remove the shampoo.  Then [employer] and genitals (private parts) with your normal soap and rinse thoroughly to remove soap. ? ?After that Use CHG Soap as you would any other liquid soap. You can apply CHG directly to the skin and wash gently with a scrungie or a clean washcloth.  ? ?Apply the CHG Soap to your body ONLY FROM THE NECK DOWN.  Do not use on open wounds or open sores. Avoid contact with  your eyes, ears, mouth and genitals (private parts). Wash Face and genitals (private parts)  with your normal soap.  ? ?Wash thoroughly, paying special attention to the area where your surgery will be performed. ? ?Thoroughly rinse your body with warm water from the neck down. ? ?DO NOT shower/wash with your normal soap after using and rinsing off the CHG Soap. ? ?Pat yourself dry with a CLEAN TOWEL. ? ?Wear CLEAN PAJAMAS to bed the night before surgery ? ?Place CLEAN SHEETS on your bed the night before your surgery ? ?DO NOT SLEEP WITH PETS. ? ? ?Day of Surgery: ? ?Take a shower with CHG soap. ?Wear Clean/Comfortable clothing the morning of surgery ?Do not apply any deodorants/lotions.   ?Remember to brush your teeth WITH YOUR REGULAR TOOTHPASTE. ? ? ? ?COVID testing ? ?If you are going to stay overnight or be admitted after your procedure/surgery and require a pre-op COVID test, please follow these instructions after your COVID test  ? ?You are not required to quarantine however you are required to wear a well-fitting mask when you are out and around people not in your household.  If your mask becomes wet or soiled, replace with a new one. ? ?Wash your hands often with soap and water for 20 seconds or clean your hands with an alcohol-based hand sanitizer that contains at least 60% alcohol. ? ?Do not share personal items. ? ?Notify your provider: ?if you are in close contact with someone who has COVID  ?or if you develop a fever of 100.4 or greater, sneezing, cough, sore throat, shortness of breath or body aches. ? ?  ?Please read over the following fact sheets that you were given.   ?

## 2021-05-10 ENCOUNTER — Encounter (HOSPITAL_COMMUNITY)
Admission: RE | Admit: 2021-05-10 | Discharge: 2021-05-10 | Disposition: A | Discharge status: Home / Self Care | Payer: PPO | Source: Ambulatory Visit | Attending: Thoracic Surgery (Cardiothoracic Vascular Surgery) | Admitting: Thoracic Surgery (Cardiothoracic Vascular Surgery)

## 2021-05-10 ENCOUNTER — Encounter (HOSPITAL_COMMUNITY): Payer: Self-pay

## 2021-05-10 ENCOUNTER — Ambulatory Visit (HOSPITAL_COMMUNITY)
Admission: RE | Admit: 2021-05-10 | Discharge: 2021-05-10 | Disposition: A | Discharge status: Home / Self Care | Payer: PPO | Source: Ambulatory Visit | Attending: Thoracic Surgery (Cardiothoracic Vascular Surgery) | Admitting: Thoracic Surgery (Cardiothoracic Vascular Surgery)

## 2021-05-10 ENCOUNTER — Encounter: Payer: Self-pay | Admitting: Thoracic Surgery (Cardiothoracic Vascular Surgery)

## 2021-05-10 ENCOUNTER — Other Ambulatory Visit: Payer: Self-pay

## 2021-05-10 VITALS — BP 117/77 | HR 77 | Temp 97.8°F | Resp 17 | Ht 69.0 in | Wt 148.8 lb

## 2021-05-10 DIAGNOSIS — I340 Nonrheumatic mitral (valve) insufficiency: Secondary | ICD-10-CM | POA: Insufficient documentation

## 2021-05-10 DIAGNOSIS — I5022 Chronic systolic (congestive) heart failure: Secondary | ICD-10-CM | POA: Diagnosis not present

## 2021-05-10 DIAGNOSIS — Z01818 Encounter for other preprocedural examination: Secondary | ICD-10-CM | POA: Diagnosis not present

## 2021-05-10 DIAGNOSIS — M954 Acquired deformity of chest and rib: Secondary | ICD-10-CM | POA: Diagnosis not present

## 2021-05-10 DIAGNOSIS — I110 Hypertensive heart disease with heart failure: Secondary | ICD-10-CM | POA: Insufficient documentation

## 2021-05-10 DIAGNOSIS — Z20822 Contact with and (suspected) exposure to covid-19: Secondary | ICD-10-CM | POA: Insufficient documentation

## 2021-05-10 LAB — CBC
HCT: 42.8 % (ref 39.0–52.0)
Hemoglobin: 14.1 g/dL (ref 13.0–17.0)
MCH: 31.5 pg (ref 26.0–34.0)
MCHC: 32.9 g/dL (ref 30.0–36.0)
MCV: 95.5 fL (ref 80.0–100.0)
Platelets: UNDETERMINED 10*3/uL (ref 150–400)
RBC: 4.48 MIL/uL (ref 4.22–5.81)
RDW: 13.1 % (ref 11.5–15.5)
WBC: 4.6 10*3/uL (ref 4.0–10.5)
nRBC: 0 % (ref 0.0–0.2)

## 2021-05-10 LAB — URINALYSIS, ROUTINE W REFLEX MICROSCOPIC
Bilirubin Urine: NEGATIVE
Glucose, UA: NEGATIVE mg/dL
Hgb urine dipstick: NEGATIVE
Ketones, ur: 5 mg/dL — AB
Leukocytes,Ua: NEGATIVE
Nitrite: NEGATIVE
Protein, ur: NEGATIVE mg/dL
Specific Gravity, Urine: 1.016 (ref 1.005–1.030)
pH: 6 (ref 5.0–8.0)

## 2021-05-10 LAB — COMPREHENSIVE METABOLIC PANEL
ALT: 16 U/L (ref 0–44)
AST: 24 U/L (ref 15–41)
Albumin: 3.6 g/dL (ref 3.5–5.0)
Alkaline Phosphatase: 53 U/L (ref 38–126)
Anion gap: 11 (ref 5–15)
BUN: 13 mg/dL (ref 8–23)
CO2: 21 mmol/L — ABNORMAL LOW (ref 22–32)
Calcium: 9.1 mg/dL (ref 8.9–10.3)
Chloride: 105 mmol/L (ref 98–111)
Creatinine, Ser: 0.82 mg/dL (ref 0.61–1.24)
GFR, Estimated: >60 mL/min (ref >60)
Glucose, Bld: 74 mg/dL (ref 70–99)
Potassium: 4.7 mmol/L (ref 3.5–5.1)
Sodium: 137 mmol/L (ref 135–145)
Total Bilirubin: 0.7 mg/dL (ref 0.3–1.2)
Total Protein: 6.1 g/dL — ABNORMAL LOW (ref 6.5–8.1)

## 2021-05-10 LAB — BLOOD GAS, ARTERIAL
Acid-Base Excess: 3.2 mmol/L — ABNORMAL HIGH (ref 0.0–2.0)
Bicarbonate: 26.9 mmol/L (ref 20.0–28.0)
Drawn by: 60286
O2 Saturation: 99.2 %
Patient temperature: 37
pCO2 arterial: 37 mmHg (ref 32–48)
pH, Arterial: 7.47 — ABNORMAL HIGH (ref 7.35–7.45)
pO2, Arterial: 115 mmHg — ABNORMAL HIGH (ref 83–108)

## 2021-05-10 LAB — TYPE AND SCREEN
ABO/RH(D): A NEG
Antibody Screen: NEGATIVE

## 2021-05-10 LAB — SURGICAL PCR SCREEN
MRSA, PCR: NEGATIVE
Staphylococcus aureus: NEGATIVE

## 2021-05-10 LAB — SARS CORONAVIRUS 2 (TAT 6-24 HRS): SARS Coronavirus 2: NEGATIVE

## 2021-05-10 LAB — PROTIME-INR
INR: 1 (ref 0.8–1.2)
Prothrombin Time: 13.3 seconds (ref 11.4–15.2)

## 2021-05-10 LAB — APTT: aPTT: 30 seconds (ref 24–36)

## 2021-05-10 NOTE — Progress Notes (Signed)
PCP - Dr. Prince Solian ?Cardiologist - Dr. Eleonore Chiquito ? ?PPM/ICD - Denies ? ?Chest x-ray - 05/10/21 ?EKG - 05/10/21 ?Stress Test - Denies ?ECHO - 05/25/20 ?Cardiac Cath - 12/22/19 ? ?Sleep Study - Denies ? ?Patient denies having diabetes. ? ?Blood Thinner Instructions: N/A ?Aspirin Instructions: Follow surgeon's instructions. ? ?ERAS Protcol - No ? ?COVID TEST- 05/10/21 in PAT ? ? ?Anesthesia review: yes, cardiac hx ? ?Patient denies shortness of breath, fever, cough and chest pain at PAT appointment ? ? ?All instructions explained to the patient, with a verbal understanding of the material. Patient agrees to go over the instructions while at home for a better understanding. Patient also instructed to self quarantine after being tested for COVID-19. The opportunity to ask questions was provided. ? ? ?

## 2021-05-11 NOTE — Anesthesia Preprocedure Evaluation (Addendum)
Anesthesia Evaluation  ?Patient identified by MRN, date of birth, ID band ?Patient awake ? ? ? ?Reviewed: ?Allergy & Precautions, NPO status , Patient's Chart, lab work & pertinent test results ? ?Airway ?Mallampati: II ? ?TM Distance: >3 FB ? ? ? ? Dental ?no notable dental hx. ? ?  ?Pulmonary ?neg pulmonary ROS,  ?  ?Pulmonary exam normal ? ? ? ? ? ? ? Cardiovascular ?hypertension, Pt. on medications and Pt. on home beta blockers ?+ Valvular Problems/Murmurs (s/p MVR ) MR  ?Rhythm:Regular Rate:Normal ? ? ?  ?Neuro/Psych ?Anxiety Depression negative neurological ROS ?   ? GI/Hepatic ?Neg liver ROS, GERD  Medicated,  ?Endo/Other  ?negative endocrine ROS ? Renal/GU ?negative Renal ROS  ?negative genitourinary ?  ?Musculoskeletal ? ?(+) Arthritis , Osteoarthritis,   ? Abdominal ?Normal abdominal exam  (+)   ?Peds ? Hematology ? ?(+) Blood dyscrasia, anemia ,   ?Anesthesia Other Findings ? ? Reproductive/Obstetrics ? ?  ? ? ? ? ? ? ? ? ? ? ? ? ? ?  ?  ? ? ? ? ? ? ?Anesthesia Physical ?Anesthesia Plan ? ?ASA: 3 ? ?Anesthesia Plan: General  ? ?Post-op Pain Management:   ? ?Induction: Intravenous ? ?PONV Risk Score and Plan: 2 and Ondansetron, Dexamethasone and Treatment may vary due to age or medical condition ? ?Airway Management Planned: Mask and Double Lumen EBT ? ?Additional Equipment: None ? ?Intra-op Plan:  ? ?Post-operative Plan: Extubation in OR ? ?Informed Consent: I have reviewed the patients History and Physical, chart, labs and discussed the procedure including the risks, benefits and alternatives for the proposed anesthesia with the patient or authorized representative who has indicated his/her understanding and acceptance.  ? ? ? ?Dental advisory given ? ?Plan Discussed with: CRNA ? ?Anesthesia Plan Comments: (Lab Results ?     Component                Value               Date                 ?     WBC                      4.6                 05/10/2021           ?     HGB                       14.1                05/10/2021           ?     HCT                      42.8                05/10/2021           ?     MCV                      95.5                05/10/2021           ?     PLT  05/10/2021           ? PLATELET CLUMPS NOTED ON SMEAR, UNABLE TO ESTIMATE ?Lab Results ?     Component                Value               Date                 ?     NA                       137                 05/10/2021           ?     K                        4.7                 05/10/2021           ?     CO2                      21 (L)              05/10/2021           ?     GLUCOSE                  74                  05/10/2021           ?     BUN                      13                  05/10/2021           ?     CREATININE               0.82                05/10/2021           ?     CALCIUM                  9.1                 05/10/2021           ?     GFRNONAA                 >60                 05/10/2021           ? ?PAT note by Karoline Caldwell, PA-C: ?Follows with cardiology for history of HFrEF, HTN, severe MR s/p minimally invasive mitral valve repair.  Cath 12/2019 showed patent coronaries.  Last seen by Dr. Audie Box 03/15/2021, and noted to be doing well.  Per note, "Ejection fraction 45%. ?No evidence of volume overload. ?Doing quite well from a cardiac standpoint. ?He is on metoprolol succinate 25 mg daily. ?He is on lisinopril 2.5 mg daily. ?No evidence of volume overload. ?This is all secondary to mitral valve regurgitation. ?Unfortunately EF may not recover. ?Despite this he has no symptoms and is doing well. ?  We will check an echo periodically. 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  Discussed that the patient may want to pursue surgery.  He was advised to follow-up with cardiothoracic  surgeon. ? ?Preop labs reviewed, unremarkable. ? ?EKG 05/10/2021: Normal sinus rhythm. Rate 72. Possible Left atrial enlargement. Left anterior fascicular block. Left ventricular hypertrophy ( R in aVL , Cornell product ). Non-specific intra-ventricular conduction delay. No significant change since last tracing ? ?CHEST - 2 VIEW 05/10/2021: ?COMPARISON: ?12/26/2020 ?? ?FINDINGS: ?Cardiac size is within normal limits. There is possible prosthetic ?cardiac valve. There are no signs of pulmonary edema or focal ?pulmonary consolidation. Small linear densities in right parahilar ?region appear stable suggesting scarring. There is no pleural ?effusion or pneumothorax. ?? ?IMPRESSION: ?No active cardiopulmonary disease. ? ?TTE 05/25/2020: ??1. Left ventricular ejection fraction, by estimation, is 40 to 45%. The  ?left ventricle has mildly decreased function. The left ventricle has no  ?regional wall motion abnormalities. Left ventricular diastolic function  ?could not be evaluated.  ??2. Right ventricular systolic function is normal. The right ventricular  ?size is normal. There is normal pulmonary artery systolic pressure. The  ?estimated right ventricular systolic pressure is 32.2 mmHg.  ??3. Left atrial size was mild to moderately dilated.  ??4. The mitral valve has been repaired/replaced. No evidence of mitral  ?valve regurgitation. No evidence of mitral stenosis. The mean mitral valve  ?gradient is 4.0 mmHg with average heart rate of 80 bpm. There is a  ?prosthetic annuloplasty ring present in  ?the mitral position. Procedure Date: 04/11/2020.  ??5. Tricuspid valve regurgitation is mild to moderate.  ??6. The aortic valve is normal in structure. Aortic valve regurgitation is  ?not visualized. No aortic stenosis is present.  ??7. The inferior vena cava is normal in size with greater than 50%  ?respiratory variability, suggesting right atrial pressure of 3 mmHg.  ? ?Comparison(s): A prior study was performed on 12/08/2019. A  successful  ?mitral annuloplasty/neo-chord repair is seen, without any residual mitral  ?insufficiency. The left ventricular function is worsened. The left  ?ventricular size is no longer dilated.  ? ?Right/left heart cath 12/22/2019: ?? Hemodynamic findings consistent with mitral valve regurgitation. ?? ?1. ?Patent coronary arteries with mild coronary ectasia and nonobstructive plaquing, no high-grade stenoses present ?2. ?Normal right heart hemodynamics and preserved cardiac output ?3. ?24 mm V waves consistent with hemodynamically significant mitral regurgitation ?)  ? ? ? ? ?Anesthesia Quick Evaluation ? ?

## 2021-05-11 NOTE — Progress Notes (Signed)
patient voiced understanding of new arrival time of 1130 Monday 05/14/2021 ?

## 2021-05-11 NOTE — Progress Notes (Signed)
Anesthesia Chart Review: ? ?Follows with cardiology for history of HFrEF, HTN, severe MR s/p minimally invasive mitral valve repair.  Cath 12/2019 showed patent coronaries.  Last seen by Dr. Audie Box 03/15/2021, and noted to be doing well.  Per note, "Ejection fraction 45%.  No evidence of volume overload.  Doing quite well from a cardiac standpoint.  He is on metoprolol succinate 25 mg daily.  He is on lisinopril 2.5 mg daily.  No evidence of volume overload.  This is all secondary to mitral valve regurgitation.  Unfortunately EF may not recover.  Despite this he has no symptoms and is doing well.  We will check an echo periodically.  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  Discussed that the patient may want to pursue surgery.  He was advised to follow-up with cardiothoracic surgeon. ? ?Preop labs reviewed, unremarkable. ? ?EKG 05/10/2021: Normal sinus rhythm. Rate 72. Possible Left atrial enlargement. Left anterior fascicular block. Left ventricular hypertrophy ( R in aVL , Cornell product ). Non-specific intra-ventricular conduction delay. No significant change since last tracing ? ?CHEST - 2 VIEW 05/10/2021: ?COMPARISON:  12/26/2020 ?  ?FINDINGS: ?Cardiac size is within normal limits. There is possible prosthetic ?cardiac valve. There are no signs of pulmonary edema or focal ?pulmonary consolidation. Small linear densities in right parahilar ?region appear stable suggesting scarring. There is no pleural ?effusion or pneumothorax. ?  ?IMPRESSION: ?No active cardiopulmonary disease. ? ?TTE 05/25/2020: ? 1. Left ventricular ejection fraction, by estimation, is 40 to 45%. The  ?left ventricle has mildly decreased function. The left ventricle has no  ?regional wall motion abnormalities. Left ventricular diastolic function  ?could not be evaluated.  ? 2. Right  ventricular systolic function is normal. The right ventricular  ?size is normal. There is normal pulmonary artery systolic pressure. The  ?estimated right ventricular systolic pressure is 31.4 mmHg.  ? 3. Left atrial size was mild to moderately dilated.  ? 4. The mitral valve has been repaired/replaced. No evidence of mitral  ?valve regurgitation. No evidence of mitral stenosis. The mean mitral valve  ?gradient is 4.0 mmHg with average heart rate of 80 bpm. There is a  ?prosthetic annuloplasty ring present in  ?the mitral position. Procedure Date: 04/11/2020.  ? 5. Tricuspid valve regurgitation is mild to moderate.  ? 6. The aortic valve is normal in structure. Aortic valve regurgitation is  ?not visualized. No aortic stenosis is present.  ? 7. The inferior vena cava is normal in size with greater than 50%  ?respiratory variability, suggesting right atrial pressure of 3 mmHg.  ? ?Comparison(s): A prior study was performed on 12/08/2019. A successful  ?mitral annuloplasty/neo-chord repair is seen, without any residual mitral  ?insufficiency. The left ventricular function is worsened. The left  ?ventricular size is no longer dilated.  ? ?Right/left heart cath 12/22/2019: ?Hemodynamic findings consistent with mitral valve regurgitation. ?  ?1.  Patent coronary arteries with mild coronary ectasia and nonobstructive plaquing, no high-grade stenoses present ?2.  Normal right heart hemodynamics and preserved cardiac output ?3.  24 mm V waves consistent with hemodynamically significant mitral regurgitation ?  ? ?Karoline Caldwell, PA-C ?[facility] Short Stay Center/Anesthesiology ?Phone [phone] ?05/11/2021 9:53 AM ? ? ?

## 2021-05-14 ENCOUNTER — Inpatient Hospital Stay (HOSPITAL_COMMUNITY): Payer: PPO | Admitting: Physician Assistant

## 2021-05-14 ENCOUNTER — Inpatient Hospital Stay (HOSPITAL_COMMUNITY): Payer: PPO | Admitting: Certified Registered Nurse Anesthetist

## 2021-05-14 ENCOUNTER — Encounter (HOSPITAL_COMMUNITY)
Admission: RE | Disposition: A | Discharge status: Home / Self Care | Payer: Self-pay | Source: Home / Self Care | Attending: Thoracic Surgery (Cardiothoracic Vascular Surgery)

## 2021-05-14 ENCOUNTER — Inpatient Hospital Stay (HOSPITAL_COMMUNITY)
Admission: RE | Admit: 2021-05-14 | Discharge: 2021-05-15 | DRG: 908 | Disposition: A | Discharge status: Home / Self Care | Payer: PPO | Attending: Thoracic Surgery (Cardiothoracic Vascular Surgery) | Admitting: Thoracic Surgery (Cardiothoracic Vascular Surgery)

## 2021-05-14 ENCOUNTER — Inpatient Hospital Stay (HOSPITAL_COMMUNITY): Payer: PPO

## 2021-05-14 ENCOUNTER — Other Ambulatory Visit: Payer: Self-pay

## 2021-05-14 ENCOUNTER — Encounter (HOSPITAL_COMMUNITY): Payer: Self-pay | Admitting: Thoracic Surgery (Cardiothoracic Vascular Surgery)

## 2021-05-14 DIAGNOSIS — K209 Esophagitis, unspecified without bleeding: Secondary | ICD-10-CM

## 2021-05-14 DIAGNOSIS — Z952 Presence of prosthetic heart valve: Secondary | ICD-10-CM

## 2021-05-14 DIAGNOSIS — M9689 Other intraoperative and postprocedural complications and disorders of the musculoskeletal system: Principal | ICD-10-CM | POA: Diagnosis present

## 2021-05-14 DIAGNOSIS — D500 Iron deficiency anemia secondary to blood loss (chronic): Secondary | ICD-10-CM

## 2021-05-14 DIAGNOSIS — Z8546 Personal history of malignant neoplasm of prostate: Secondary | ICD-10-CM

## 2021-05-14 DIAGNOSIS — Z79899 Other long term (current) drug therapy: Secondary | ICD-10-CM

## 2021-05-14 DIAGNOSIS — R0789 Other chest pain: Secondary | ICD-10-CM | POA: Diagnosis present

## 2021-05-14 DIAGNOSIS — N400 Enlarged prostate without lower urinary tract symptoms: Secondary | ICD-10-CM | POA: Diagnosis present

## 2021-05-14 DIAGNOSIS — I340 Nonrheumatic mitral (valve) insufficiency: Secondary | ICD-10-CM

## 2021-05-14 DIAGNOSIS — J9811 Atelectasis: Secondary | ICD-10-CM | POA: Diagnosis not present

## 2021-05-14 DIAGNOSIS — Y848 Other medical procedures as the cause of abnormal reaction of the patient, or of later complication, without mention of misadventure at the time of the procedure: Secondary | ICD-10-CM | POA: Diagnosis present

## 2021-05-14 DIAGNOSIS — I10 Essential (primary) hypertension: Secondary | ICD-10-CM | POA: Diagnosis not present

## 2021-05-14 DIAGNOSIS — I444 Left anterior fascicular block: Secondary | ICD-10-CM | POA: Diagnosis present

## 2021-05-14 DIAGNOSIS — D649 Anemia, unspecified: Secondary | ICD-10-CM | POA: Diagnosis not present

## 2021-05-14 DIAGNOSIS — F419 Anxiety disorder, unspecified: Secondary | ICD-10-CM | POA: Diagnosis present

## 2021-05-14 DIAGNOSIS — J9572 Accidental puncture and laceration of a respiratory system organ or structure during other procedure: Secondary | ICD-10-CM | POA: Diagnosis not present

## 2021-05-14 DIAGNOSIS — Z20822 Contact with and (suspected) exposure to covid-19: Secondary | ICD-10-CM | POA: Diagnosis present

## 2021-05-14 DIAGNOSIS — Z09 Encounter for follow-up examination after completed treatment for conditions other than malignant neoplasm: Secondary | ICD-10-CM

## 2021-05-14 DIAGNOSIS — K219 Gastro-esophageal reflux disease without esophagitis: Secondary | ICD-10-CM | POA: Diagnosis present

## 2021-05-14 DIAGNOSIS — M6289 Other specified disorders of muscle: Secondary | ICD-10-CM | POA: Diagnosis not present

## 2021-05-14 DIAGNOSIS — K760 Fatty (change of) liver, not elsewhere classified: Secondary | ICD-10-CM | POA: Diagnosis present

## 2021-05-14 DIAGNOSIS — J189 Pneumonia, unspecified organism: Secondary | ICD-10-CM | POA: Diagnosis not present

## 2021-05-14 DIAGNOSIS — J8410 Pulmonary fibrosis, unspecified: Secondary | ICD-10-CM | POA: Diagnosis not present

## 2021-05-14 DIAGNOSIS — J948 Other specified pleural conditions: Secondary | ICD-10-CM | POA: Diagnosis present

## 2021-05-14 DIAGNOSIS — F32A Depression, unspecified: Secondary | ICD-10-CM | POA: Diagnosis not present

## 2021-05-14 DIAGNOSIS — J9589 Other postprocedural complications and disorders of respiratory system, not elsewhere classified: Principal | ICD-10-CM | POA: Diagnosis present

## 2021-05-14 DIAGNOSIS — K449 Diaphragmatic hernia without obstruction or gangrene: Secondary | ICD-10-CM

## 2021-05-14 DIAGNOSIS — K319 Disease of stomach and duodenum, unspecified: Secondary | ICD-10-CM

## 2021-05-14 DIAGNOSIS — M954 Acquired deformity of chest and rib: Secondary | ICD-10-CM

## 2021-05-14 DIAGNOSIS — D72828 Other elevated white blood cell count: Secondary | ICD-10-CM | POA: Diagnosis not present

## 2021-05-14 DIAGNOSIS — Z7982 Long term (current) use of aspirin: Secondary | ICD-10-CM | POA: Diagnosis not present

## 2021-05-14 DIAGNOSIS — Y658 Other specified misadventures during surgical and medical care: Secondary | ICD-10-CM | POA: Diagnosis not present

## 2021-05-14 DIAGNOSIS — F418 Other specified anxiety disorders: Secondary | ICD-10-CM | POA: Diagnosis not present

## 2021-05-14 DIAGNOSIS — Z4682 Encounter for fitting and adjustment of non-vascular catheter: Secondary | ICD-10-CM | POA: Diagnosis not present

## 2021-05-14 DIAGNOSIS — K432 Incisional hernia without obstruction or gangrene: Secondary | ICD-10-CM | POA: Diagnosis present

## 2021-05-14 DIAGNOSIS — D509 Iron deficiency anemia, unspecified: Secondary | ICD-10-CM

## 2021-05-14 DIAGNOSIS — E785 Hyperlipidemia, unspecified: Secondary | ICD-10-CM | POA: Diagnosis present

## 2021-05-14 DIAGNOSIS — J984 Other disorders of lung: Secondary | ICD-10-CM | POA: Diagnosis not present

## 2021-05-14 DIAGNOSIS — R195 Other fecal abnormalities: Secondary | ICD-10-CM

## 2021-05-14 HISTORY — PX: MASS EXCISION: SHX2000

## 2021-05-14 SURGERY — EXCISION, MASS, CHEST WALL
Anesthesia: General | Site: Chest | Laterality: Right

## 2021-05-14 MED ORDER — FENTANYL CITRATE (PF) 100 MCG/2ML IJ SOLN: 25.0000 ug | INTRAMUSCULAR | Status: DC | PRN

## 2021-05-14 MED ORDER — LACTATED RINGERS IV SOLN: INTRAVENOUS | Status: DC

## 2021-05-14 MED ORDER — CEFAZOLIN SODIUM-DEXTROSE 2-4 GM/100ML-% IV SOLN
2.0000 g | INTRAVENOUS | Status: AC
  Administered 2021-05-14: 2 g via INTRAVENOUS
  Filled 2021-05-14: qty 100

## 2021-05-14 MED ORDER — ONDANSETRON HCL 4 MG/2ML IJ SOLN
INTRAMUSCULAR | Status: DC | PRN
  Administered 2021-05-14: 4 mg via INTRAVENOUS

## 2021-05-14 MED ORDER — ESCITALOPRAM OXALATE 10 MG PO TABS: 5.0000 mg | ORAL_TABLET | Freq: Every evening | ORAL | Status: DC

## 2021-05-14 MED ORDER — SUGAMMADEX SODIUM 200 MG/2ML IV SOLN
INTRAVENOUS | Status: DC | PRN
Start: 2021-05-14 — End: 2021-05-14
  Administered 2021-05-14: 200 mg via INTRAVENOUS

## 2021-05-14 MED ORDER — LIDOCAINE 2% (20 MG/ML) 5 ML SYRINGE
INTRAMUSCULAR | Status: AC
  Filled 2021-05-14: qty 5

## 2021-05-14 MED ORDER — PROPOFOL 10 MG/ML IV BOLUS
INTRAVENOUS | Status: DC | PRN
  Administered 2021-05-14: 150 mg via INTRAVENOUS

## 2021-05-14 MED ORDER — FENTANYL CITRATE (PF) 250 MCG/5ML IJ SOLN
INTRAMUSCULAR | Status: DC | PRN
  Administered 2021-05-14 (×5): 50 ug via INTRAVENOUS

## 2021-05-14 MED ORDER — ACETAMINOPHEN 10 MG/ML IV SOLN
INTRAVENOUS | Status: AC
  Administered 2021-05-14: 1000 mg via INTRAVENOUS
  Filled 2021-05-14: qty 100

## 2021-05-14 MED ORDER — HYPROMELLOSE (GONIOSCOPIC) 2.5 % OP SOLN
1.0000 [drp] | Freq: Three times a day (TID) | OPHTHALMIC | Status: DC | PRN
  Filled 2021-05-14: qty 15

## 2021-05-14 MED ORDER — ASPIRIN EC 81 MG PO TBEC
81.0000 mg | DELAYED_RELEASE_TABLET | Freq: Every day | ORAL | Status: DC
  Administered 2021-05-14 – 2021-05-15 (×2): 81 mg via ORAL
  Filled 2021-05-14 (×2): qty 1

## 2021-05-14 MED ORDER — ORAL CARE MOUTH RINSE: 15.0000 mL | Freq: Once | OROMUCOSAL | Status: AC

## 2021-05-14 MED ORDER — BISACODYL 5 MG PO TBEC: 10.0000 mg | DELAYED_RELEASE_TABLET | Freq: Every day | ORAL | Status: DC

## 2021-05-14 MED ORDER — ROCURONIUM BROMIDE 10 MG/ML (PF) SYRINGE
PREFILLED_SYRINGE | INTRAVENOUS | Status: AC
  Filled 2021-05-14: qty 10

## 2021-05-14 MED ORDER — ENOXAPARIN SODIUM 40 MG/0.4ML IJ SOSY
40.0000 mg | PREFILLED_SYRINGE | Freq: Every day | INTRAMUSCULAR | Status: DC
  Administered 2021-05-15: 40 mg via SUBCUTANEOUS
  Filled 2021-05-14 (×2): qty 0.4

## 2021-05-14 MED ORDER — METOPROLOL SUCCINATE ER 25 MG PO TB24
25.0000 mg | ORAL_TABLET | Freq: Every day | ORAL | Status: DC
  Administered 2021-05-15: 25 mg via ORAL
  Filled 2021-05-14: qty 1

## 2021-05-14 MED ORDER — 0.9 % SODIUM CHLORIDE (POUR BTL) OPTIME
TOPICAL | Status: DC | PRN
  Administered 2021-05-14: 2000 mL

## 2021-05-14 MED ORDER — DEXTROSE-NACL 5-0.9 % IV SOLN: INTRAVENOUS | Status: DC

## 2021-05-14 MED ORDER — ACETAMINOPHEN 500 MG PO TABS
1000.0000 mg | ORAL_TABLET | Freq: Four times a day (QID) | ORAL | Status: DC
  Administered 2021-05-14 – 2021-05-15 (×3): 1000 mg via ORAL
  Filled 2021-05-14 (×3): qty 2

## 2021-05-14 MED ORDER — KETOROLAC TROMETHAMINE 15 MG/ML IJ SOLN
INTRAMUSCULAR | Status: AC
Start: 2021-05-14 — End: 2021-05-14
  Administered 2021-05-14: 15 mg via INTRAVENOUS
  Filled 2021-05-14: qty 1

## 2021-05-14 MED ORDER — POLYVINYL ALCOHOL 1.4 % OP SOLN
1.0000 [drp] | Freq: Three times a day (TID) | OPHTHALMIC | Status: DC | PRN
  Filled 2021-05-14: qty 15

## 2021-05-14 MED ORDER — OXYCODONE HCL 5 MG PO TABS
5.0000 mg | ORAL_TABLET | ORAL | Status: DC | PRN
  Administered 2021-05-14 (×2): 5 mg via ORAL
  Filled 2021-05-14: qty 1

## 2021-05-14 MED ORDER — ROCURONIUM BROMIDE 10 MG/ML (PF) SYRINGE
PREFILLED_SYRINGE | INTRAVENOUS | Status: DC | PRN
Start: 2021-05-14 — End: 2021-05-14
  Administered 2021-05-14: 70 mg via INTRAVENOUS

## 2021-05-14 MED ORDER — OXYCODONE HCL 5 MG PO TABS
ORAL_TABLET | ORAL | Status: AC
  Filled 2021-05-14: qty 1

## 2021-05-14 MED ORDER — DEXAMETHASONE SODIUM PHOSPHATE 10 MG/ML IJ SOLN
INTRAMUSCULAR | Status: AC
  Filled 2021-05-14: qty 1

## 2021-05-14 MED ORDER — PROPOFOL 10 MG/ML IV BOLUS
INTRAVENOUS | Status: AC
  Filled 2021-05-14: qty 20

## 2021-05-14 MED ORDER — ACETAMINOPHEN 160 MG/5ML PO SOLN: 1000.0000 mg | Freq: Four times a day (QID) | ORAL | Status: DC

## 2021-05-14 MED ORDER — BUPROPION HCL ER (XL) 300 MG PO TB24
300.0000 mg | ORAL_TABLET | Freq: Every morning | ORAL | Status: DC
  Administered 2021-05-15: 300 mg via ORAL
  Filled 2021-05-14: qty 1

## 2021-05-14 MED ORDER — MIDAZOLAM HCL 2 MG/2ML IJ SOLN
INTRAMUSCULAR | Status: DC | PRN
  Administered 2021-05-14 (×2): 1 mg via INTRAVENOUS

## 2021-05-14 MED ORDER — TRAMADOL HCL 50 MG PO TABS: 50.0000 mg | ORAL_TABLET | Freq: Four times a day (QID) | ORAL | Status: DC | PRN

## 2021-05-14 MED ORDER — ACETAMINOPHEN 10 MG/ML IV SOLN: 1000.0000 mg | Freq: Once | INTRAVENOUS | Status: DC | PRN

## 2021-05-14 MED ORDER — FENTANYL CITRATE (PF) 250 MCG/5ML IJ SOLN
INTRAMUSCULAR | Status: AC
  Filled 2021-05-14: qty 5

## 2021-05-14 MED ORDER — PHENYLEPHRINE HCL-NACL 20-0.9 MG/250ML-% IV SOLN
INTRAVENOUS | Status: DC | PRN
  Administered 2021-05-14: 25 ug/min via INTRAVENOUS

## 2021-05-14 MED ORDER — LIDOCAINE 2% (20 MG/ML) 5 ML SYRINGE
INTRAMUSCULAR | Status: DC | PRN
  Administered 2021-05-14: 60 mg via INTRAVENOUS

## 2021-05-14 MED ORDER — FLUTICASONE PROPIONATE 50 MCG/ACT NA SUSP
1.0000 | Freq: Every day | NASAL | Status: DC | PRN
  Filled 2021-05-14: qty 16

## 2021-05-14 MED ORDER — TRAZODONE HCL 50 MG PO TABS
50.0000 mg | ORAL_TABLET | Freq: Every day | ORAL | Status: DC
  Administered 2021-05-14: 50 mg via ORAL
  Filled 2021-05-14: qty 1

## 2021-05-14 MED ORDER — ROSUVASTATIN CALCIUM 5 MG PO TABS
5.0000 mg | ORAL_TABLET | Freq: Every day | ORAL | Status: DC
  Administered 2021-05-15: 5 mg via ORAL
  Filled 2021-05-14: qty 1

## 2021-05-14 MED ORDER — ONDANSETRON HCL 4 MG/2ML IJ SOLN: 4.0000 mg | Freq: Four times a day (QID) | INTRAMUSCULAR | Status: DC | PRN

## 2021-05-14 MED ORDER — DEXAMETHASONE SODIUM PHOSPHATE 10 MG/ML IJ SOLN
INTRAMUSCULAR | Status: DC | PRN
  Administered 2021-05-14: 10 mg via INTRAVENOUS

## 2021-05-14 MED ORDER — MIDAZOLAM HCL 2 MG/2ML IJ SOLN
INTRAMUSCULAR | Status: AC
  Filled 2021-05-14: qty 2

## 2021-05-14 MED ORDER — LISINOPRIL 2.5 MG PO TABS
2.5000 mg | ORAL_TABLET | Freq: Every day | ORAL | Status: DC
  Administered 2021-05-15: 2.5 mg via ORAL
  Filled 2021-05-14: qty 1

## 2021-05-14 MED ORDER — SENNOSIDES-DOCUSATE SODIUM 8.6-50 MG PO TABS: 1.0000 | ORAL_TABLET | Freq: Every day | ORAL | Status: DC

## 2021-05-14 MED ORDER — KETOROLAC TROMETHAMINE 15 MG/ML IJ SOLN
15.0000 mg | Freq: Four times a day (QID) | INTRAMUSCULAR | Status: DC
  Administered 2021-05-14 – 2021-05-15 (×3): 15 mg via INTRAVENOUS
  Filled 2021-05-14 (×4): qty 1

## 2021-05-14 MED ORDER — ONDANSETRON HCL 4 MG/2ML IJ SOLN
INTRAMUSCULAR | Status: AC
  Filled 2021-05-14: qty 2

## 2021-05-14 MED ORDER — PANTOPRAZOLE SODIUM 40 MG PO TBEC
40.0000 mg | DELAYED_RELEASE_TABLET | Freq: Every day | ORAL | Status: DC
  Administered 2021-05-15: 40 mg via ORAL
  Filled 2021-05-14: qty 1

## 2021-05-14 MED ORDER — CHLORHEXIDINE GLUCONATE 0.12 % MT SOLN
15.0000 mL | Freq: Once | OROMUCOSAL | Status: AC
  Administered 2021-05-14: 15 mL via OROMUCOSAL
  Filled 2021-05-14: qty 15

## 2021-05-14 MED ORDER — CEFAZOLIN SODIUM-DEXTROSE 2-4 GM/100ML-% IV SOLN
2.0000 g | Freq: Three times a day (TID) | INTRAVENOUS | Status: AC
  Administered 2021-05-14 – 2021-05-15 (×2): 2 g via INTRAVENOUS
  Filled 2021-05-14 (×2): qty 100

## 2021-05-14 SURGICAL SUPPLY — 86 items
APPLICATOR TIP EXT COSEAL (VASCULAR PRODUCTS) IMPLANT
APPLIER CLIP LOGIC TI 5 (MISCELLANEOUS) IMPLANT
APPLIER CLIP ROT 10 11.4 M/L (STAPLE)
BIT DRILL 7/64X5 DISP (BIT) ×2 IMPLANT
BLADE OSCILLATING /SAGITTAL (BLADE) ×1 IMPLANT
BLADE SAW GIGLI 510 (BLADE) ×1 IMPLANT
BOWL SMART MIX CTS (DISPOSABLE) IMPLANT
CANISTER SUCT 3000ML PPV (MISCELLANEOUS) ×2 IMPLANT
CATH HYDRAGLIDE XL THORACIC (CATHETERS) IMPLANT
CATH THORACIC 28FR (CATHETERS) IMPLANT
CATH THORACIC 36FR (CATHETERS) IMPLANT
CATH THORACIC 36FR RT ANG (CATHETERS) IMPLANT
CLIP APPLIE ROT 10 11.4 M/L (STAPLE) IMPLANT
CLIP TI MEDIUM 6 (CLIP) ×1 IMPLANT
CNTNR URN SCR LID CUP LEK RST (MISCELLANEOUS) ×2 IMPLANT
CONN Y 3/8X3/8X3/8  BEN (MISCELLANEOUS)
CONN Y 3/8X3/8X3/8 BEN (MISCELLANEOUS) IMPLANT
CONT SPEC 4OZ STRL OR WHT (MISCELLANEOUS) ×2
COVER SURGICAL LIGHT HANDLE (MISCELLANEOUS) ×3 IMPLANT
DERMABOND ADVANCED (GAUZE/BANDAGES/DRESSINGS) ×1
DERMABOND ADVANCED .7 DNX12 (GAUZE/BANDAGES/DRESSINGS) ×1 IMPLANT
DRAIN CHANNEL 28F RND 3/8 FF (WOUND CARE) IMPLANT
DRAPE CHEST BREAST 15X10 FENES (DRAPES) ×2 IMPLANT
ELECT BLADE 4.0 EZ CLEAN MEGAD (MISCELLANEOUS) ×2
ELECT REM PT RETURN 9FT ADLT (ELECTROSURGICAL) ×2
ELECTRODE BLDE 4.0 EZ CLN MEGD (MISCELLANEOUS) IMPLANT
ELECTRODE REM PT RTRN 9FT ADLT (ELECTROSURGICAL) ×1 IMPLANT
FELT TEFLON 1X6 (MISCELLANEOUS) ×1 IMPLANT
GAUZE 4X4 16PLY ~~LOC~~+RFID DBL (SPONGE) ×1 IMPLANT
GAUZE SPONGE 4X4 12PLY STRL (GAUZE/BANDAGES/DRESSINGS) ×2 IMPLANT
GLOVE SURG SIGNA 7.5 PF LTX (GLOVE) ×2 IMPLANT
GOWN STRL REUS W/ TWL LRG LVL3 (GOWN DISPOSABLE) ×2 IMPLANT
GOWN STRL REUS W/ TWL XL LVL3 (GOWN DISPOSABLE) ×1 IMPLANT
GOWN STRL REUS W/TWL LRG LVL3 (GOWN DISPOSABLE) ×6
GOWN STRL REUS W/TWL XL LVL3 (GOWN DISPOSABLE) ×2
HANDLE STAPLE ENDO GIA SHORT (STAPLE) ×1
HEMOSTAT SURGICEL 2X14 (HEMOSTASIS) IMPLANT
KIT BASIN OR (CUSTOM PROCEDURE TRAY) ×2 IMPLANT
KIT TURNOVER KIT B (KITS) ×2 IMPLANT
MESH PROLENE 3X6 (Mesh General) ×1 IMPLANT
NS IRRIG 1000ML POUR BTL (IV SOLUTION) ×4 IMPLANT
PACK CHEST (CUSTOM PROCEDURE TRAY) ×2 IMPLANT
PAD ARMBOARD 7.5X6 YLW CONV (MISCELLANEOUS) ×4 IMPLANT
PASSER SUT SWANSON 36MM LOOP (INSTRUMENTS) ×1 IMPLANT
RELOAD EGIA 45 MED/THCK PURPLE (STAPLE) ×2 IMPLANT
SEALANT PROGEL (MISCELLANEOUS) IMPLANT
SEALANT SURG COSEAL 4ML (VASCULAR PRODUCTS) IMPLANT
SEALANT SURG COSEAL 8ML (VASCULAR PRODUCTS) IMPLANT
SPONGE T-LAP 18X18 ~~LOC~~+RFID (SPONGE) ×5 IMPLANT
SPONGE T-LAP 4X18 ~~LOC~~+RFID (SPONGE) ×1 IMPLANT
SPONGE TONSIL 1 RF SGL (DISPOSABLE) ×1 IMPLANT
SPONGE TONSIL TAPE 1 RFD (DISPOSABLE) ×2 IMPLANT
STAPLER ENDO GIA 12 SHRT THIN (STAPLE) IMPLANT
STAPLER ENDO GIA 12MM SHORT (STAPLE) ×1 IMPLANT
STAPLER VISISTAT 35W (STAPLE) IMPLANT
SUT PROLENE 0 CT 1 CR/8 (SUTURE) IMPLANT
SUT PROLENE 1 CT (SUTURE) IMPLANT
SUT PROLENE 1 MO 6 (SUTURE) IMPLANT
SUT PROLENE 1 XLH 60 (SUTURE) IMPLANT
SUT PROLENE 2 0 CR (SUTURE) IMPLANT
SUT PROLENE 2 0 CT 1 (SUTURE) ×8 IMPLANT
SUT PROLENE 2 0 SH 30 (SUTURE) ×3 IMPLANT
SUT PROLENE 2 TP 1 (SUTURE) IMPLANT
SUT PROLENE 3 0 SH DA (SUTURE) IMPLANT
SUT PROLENE 4 0 RB 1 (SUTURE)
SUT PROLENE 4-0 RB1 .5 CRCL 36 (SUTURE) IMPLANT
SUT SILK  1 MH (SUTURE) ×2
SUT SILK 0 FSL (SUTURE) ×4 IMPLANT
SUT SILK 1 MH (SUTURE) IMPLANT
SUT SILK 2 0SH CR/8 30 (SUTURE) ×2 IMPLANT
SUT VIC AB 1 CTX 18 (SUTURE) ×2 IMPLANT
SUT VIC AB 1 CTX 36 (SUTURE) ×4
SUT VIC AB 1 CTX36XBRD ANBCTR (SUTURE) ×1 IMPLANT
SUT VIC AB 2-0 CTX 27 (SUTURE) ×1 IMPLANT
SUT VIC AB 2-0 CTX 36 (SUTURE) ×2 IMPLANT
SUT VIC AB 3-0 MH 27 (SUTURE) IMPLANT
SUT VIC AB 3-0 X1 27 (SUTURE) ×3 IMPLANT
SUT VICRYL 2 TP 1 (SUTURE) ×2 IMPLANT
SYSTEM SAHARA CHEST DRAIN ATS (WOUND CARE) ×2 IMPLANT
TAPE CLOTH 4X10 WHT NS (GAUZE/BANDAGES/DRESSINGS) ×2 IMPLANT
TAPE CLOTH SURG 4X10 WHT LF (GAUZE/BANDAGES/DRESSINGS) ×1 IMPLANT
TAPE UMBILICAL COTTON 1/8X30 (MISCELLANEOUS) IMPLANT
TOWEL GREEN STERILE (TOWEL DISPOSABLE) ×2 IMPLANT
TOWEL GREEN STERILE FF (TOWEL DISPOSABLE) ×1 IMPLANT
TUNNELER SHEATH ON-Q 11GX8 DSP (PAIN MANAGEMENT) IMPLANT
WATER STERILE IRR 1000ML POUR (IV SOLUTION) ×3 IMPLANT

## 2021-05-14 NOTE — Anesthesia Procedure Notes (Signed)
Procedure Name: Intubation ?Date/Time: 05/14/2021 1:08 PM ?Performed by: Mariea Clonts, CRNA ?Pre-anesthesia Checklist: Patient identified, Emergency Drugs available, Suction available and Patient being monitored ?Patient Re-evaluated:Patient Re-evaluated prior to induction ?Oxygen Delivery Method: Circle System Utilized ?Preoxygenation: Pre-oxygenation with 100% oxygen ?Induction Type: IV induction and Cricoid Pressure applied ?Ventilation: Mask ventilation without difficulty ?Laryngoscope Size: Mac and 3 ?Grade View: Grade II ?Tube type: Oral ?Endobronchial tube: Left, Double lumen EBT, EBT position confirmed by auscultation and EBT position confirmed by fiberoptic bronchoscope and 37 Fr ?Number of attempts: 1 ?Airway Equipment and Method: Stylet and Oral airway ?Placement Confirmation: ETT inserted through vocal cords under direct vision, positive ETCO2 and breath sounds checked- equal and bilateral ?Tube secured with: Tape ?Dental Injury: Teeth and Oropharynx as per pre-operative assessment  ? ? ? ? ?

## 2021-05-14 NOTE — Interval H&P Note (Signed)
History and Physical Interval Note: ? ?05/14/2021 ?12:42 PM ? ?Tyler [name]  has presented today for surgery, with the diagnosis of RIGHT CHEST WALL HERNIA.  The various methods of treatment have been discussed with the patient and family. After consideration of risks, benefits and other options for treatment, the patient has consented to  Procedure(s): ?REPAIR RIGHT CHEST WALL HERNIA WITH MESH (Right) as a surgical intervention.  The patient's history has been reviewed, patient examined, no change in status, stable for surgery.  I have reviewed the patient's chart and labs.  Questions were answered to the patient's satisfaction.   ? ? ?Melrose Nakayama ? ? ?

## 2021-05-14 NOTE — H&P (Signed)
HPI: Tyler [name] returns regarding his right chest wall hernia.     Tyler [name] is a 71 year old man who had minimally invasive mitral valve repair by Dr. Roxy Manns in February 2022.  He developed a bulge in the chest wall with coughing at the operative site.  Originally, it started out about the size of a quarter but had enlarged.  I saw him in October.  He was not having any pain.     Over time the hernia has continued to enlarge.  He still does not have any pain associated with it.  He is not having any shortness of breath.  He now wishes to have it repaired.       Past Medical History:  Diagnosis Date   Adenomatous polyp of colon 02/2004   Anemia     Anxiety     Arthritis     BPH (benign prostatic hyperplasia)     Colonic varices     Degenerative joint disease     Depression     Depression     Fatty liver     GERD (gastroesophageal reflux disease)     Heart murmur     Hemorrhoids     Hyperlipidemia     Hypertension     IBS (irritable bowel syndrome)     Prostate cancer ([place]) 2011   S/P minimally invasive mitral valve repair 04/11/2020    Complex valvuloplasty including artificial Gore-tex neochord placement x8 with suture plication of posterior commissure and 30 mm Sorin Memo 3D ring annuloplasty via right mini thoracotomy approach   Thrombocytopenia ([place])     Thyroid nodule           Past Surgical History:  Procedure Laterality Date   BUBBLE STUDY   12/22/2019    Procedure: BUBBLE STUDY;  Surgeon: Geralynn Rile, MD;  Location: [facility];  Service: Cardiovascular;;   CARDIAC CATHETERIZATION       COLONOSCOPY       MITRAL VALVE REPAIR Right 04/11/2020    Procedure: MINIMALLY INVASIVE MITRAL VALVE REPAIR (MVR) USING MEMO 4D RING SIZE 30MM;  Surgeon: Rexene Alberts, MD;  Location: [facility];  Service: Open Heart Surgery;  Laterality: Right;   PROSTATECTOMY       RIGHT/LEFT HEART CATH AND CORONARY ANGIOGRAPHY N/A 12/22/2019    Procedure: RIGHT/LEFT HEART CATH  AND CORONARY ANGIOGRAPHY;  Surgeon: [name], MD;  Location: [facility];  Service: Cardiovascular;  Laterality: N/A;   TEE WITHOUT CARDIOVERSION N/A 12/22/2019    Procedure: TRANSESOPHAGEAL ECHOCARDIOGRAM (TEE);  Surgeon: Geralynn Rile, MD;  Location: [place];  Service: Cardiovascular;  Laterality: N/A;   TEE WITHOUT CARDIOVERSION N/A 04/11/2020    Procedure: TRANSESOPHAGEAL ECHOCARDIOGRAM (TEE);  Surgeon: Rexene Alberts, MD;  Location: [place];  Service: Open Heart Surgery;  Laterality: N/A;   TONSILLECTOMY       TOOTH EXTRACTION   03/07/2020   TYMPANOPLASTY   01/2016                Current Outpatient Medications  Medication Sig Dispense Refill   amoxicillin (AMOXIL) 500 MG tablet Take 4 tablets 30-60 minutes before dental procedures 4 tablet 1   aspirin EC 81 MG tablet Take 1 tablet (81 mg total) by mouth daily. Swallow whole. 90 tablet 3   buPROPion (WELLBUTRIN XL) 150 MG 24 hr tablet Take 300 mg by mouth in the morning.       dicyclomine (BENTYL) 10 MG capsule TAKE 1 CAPSULE BY MOUTH  3 TIMES DAILY AS NEEDED FOR SPASMS (ABD PAIN AND CRAMPING). (Patient taking differently: Take 10 mg by mouth daily as needed for spasms (or abdominal pain/cramping).) 90 capsule 3   escitalopram (LEXAPRO) 5 MG tablet Take 5 mg by mouth every evening.        hydroxypropyl methylcellulose / hypromellose (ISOPTO TEARS / GONIOVISC) 2.5 % ophthalmic solution Place 1 drop into both eyes 3 (three) times daily as needed (for dryness).        metoprolol succinate (TOPROL-XL) 25 MG 24 hr tablet Take 0.5 tablets (12.5 mg total) by mouth daily. (Patient taking differently: Take 25 mg by mouth daily.) 45 tablet 3   pantoprazole (PROTONIX) 40 MG tablet TAKE 1 TABLET BY MOUTH EVERY DAY (Patient taking differently: Take 40 mg by mouth daily before breakfast.) 90 tablet 0   sildenafil (VIAGRA) 100 MG tablet Take 100 mg by mouth daily as needed for erectile dysfunction.       traZODone (DESYREL) 50 MG  tablet Take 50 mg by mouth at bedtime.       lisinopril (ZESTRIL) 2.5 MG tablet Take 1 tablet (2.5 mg total) by mouth daily. 90 tablet 3    No current facility-administered medications for this visit.      Physical Exam BP 124/78 (BP Location: Left Arm, Patient Position: Sitting)    Pulse 72    Resp 20    Ht '5\' 9"'$  (1.753 m)    Wt 152 lb (68.9 kg)    SpO2 96% Comment: RA   BMI 22.45 kg/m  Well-appearing 71 year old man in no acute distress Alert and oriented x3 with no focal motor deficits Cardiac regular rate and rhythm no murmur Lungs clear bilaterally Chest wall defect palpable in third interspace on the right anteriorly, lung bulges with coughing Abdomen soft nontender No edema   Diagnostic Tests: CT CHEST WITH CONTRAST   TECHNIQUE: Multidetector CT imaging of the chest was performed during intravenous contrast administration.   RADIATION DOSE REDUCTION: This exam was performed according to the departmental dose-optimization program which includes automated exposure control, adjustment of the mA and/or kV according to patient size and/or use of iterative reconstruction technique.   CONTRAST:  51m ISOVUE-300 IOPAMIDOL (ISOVUE-300) INJECTION 61%   COMPARISON:  Chest CTA 01/12/2020.   FINDINGS: Cardiovascular: Heart size is normal. There is no significant pericardial fluid, thickening or pericardial calcification. There is aortic atherosclerosis, as well as atherosclerosis of the great vessels of the mediastinum and the coronary arteries, including calcified atherosclerotic plaque in the left anterior descending, left circumflex and right coronary arteries. Status post mitral annuloplasty.   Mediastinum/Nodes: No pathologically enlarged mediastinal or hilar lymph nodes. Esophagus is unremarkable in appearance. No axillary lymphadenopathy.   Lungs/Pleura: Small calcified granulomas are noted in the right lower lobe. No other suspicious appearing pulmonary nodules  or masses are noted. No acute consolidative airspace disease. No pleural effusions. Architectural distortion in the periphery of the right upper and middle lobes, favored to reflect areas of chronic post infectious or inflammatory scarring, new compared to prior study from 01/12/2020.   Upper Abdomen: Diffuse low attenuation throughout the visualized hepatic parenchyma, indicative of hepatic steatosis. Multiple nonobstructive calculi are noted within the collecting systems of both kidneys measuring up to 3 mm. Low-attenuation lesions in both kidneys, compatible with simple cysts, measuring up to 2.7 cm in the upper pole of the right kidney. Other subcentimeter low-attenuation lesions in the right kidney, too small to characterize, but statistically likely to represent tiny cysts.  Musculoskeletal: There are no acute displaced fractures or aggressive appearing lytic or blastic lesions noted in the visualized portions of the skeleton.   IMPRESSION: 1. No acute findings are noted in the chest to account for the patient's symptoms. 2. Areas of apparent post infectious or inflammatory scarring noted in the periphery of the right upper and right middle lobes. 3. Old granulomatous disease, as above. 4. Aortic atherosclerosis, in addition to three-vessel coronary artery disease. Assessment for potential risk factor modification, dietary therapy or pharmacologic therapy may be warranted, if clinically indicated. 5. Hepatic steatosis. 6. Nonobstructive calculi in the collecting systems of both kidneys measuring up to 3 mm.   Aortic Atherosclerosis (ICD10-I70.0).     Electronically Signed   By: Vinnie Langton M.D.   On: 03/28/2021 09:10 I personally reviewed the CT images.  There is a lung herniation at the third interspace on the right.   Impression: Tyler [name] is a 71 year old man with a history of severe mitral regurgitation status post mitral valve repair via right thoracotomy  about a year ago.     He now has a chest wall defect with a lung hernia.  He remains very anxious about this.  I again explained to him that this was not of any risk to him.  It is not large enough to be causing any type of respiratory compromise.   He wishes to have the hernia repair.  I cautioned him that he could have significant pain afterwards.   I described the postoperative procedure to him.  We would need to make an incision over the area dissect the lung off the musculature and chest wall and place a prosthetic material to cover the defect.  We would need to do that under general anesthesia.  He may or may not need a chest tube postoperatively.  I informed him of the indications, risks, benefits, and alternatives.  He understands the risks include, but are not limited to bleeding, infection (possibly with need for removal of mesh), pain, lung injury, DVT, PE, as well as possibility of other unforeseeable complications.   He and his wife have a trip planned from mid February to early March.  He would like to do it after he returns from that.   Plan: Repair of right anterior chest wall hernia with mesh on 05/14/2021.   Melrose Nakayama, MD [facility] [phone]                Electronically signed by Melrose Nakayama, MD at 03/28/2021  4:13 PM  No interval change  Revonda Standard. Roxan Hockey, MD [facility] [phone]

## 2021-05-14 NOTE — Brief Op Note (Signed)
05/14/2021 ? ?2:37 PM ? ?PATIENT:  Tyler [name]  71 y.o. male ? ?PRE-OPERATIVE DIAGNOSIS:  RIGHT CHEST WALL HERNIA ? ?POST-OPERATIVE DIAGNOSIS:  RIGHT CHEST WALL HERNIA ? ?PROCEDURE:  Procedure(s): ?REPAIR RIGHT CHEST WALL HERNIA WITH MESH (Right) ? ?SURGEON:  Surgeon(s) and Role: ?   * Melrose Nakayama, MD - Primary ? ?PHYSICIAN ASSISTANT: Malon Siddall PA-C ? ?ASSISTANTS: none  ? ?ANESTHESIA:   general ? ?EBL: 50 CC ? ?BLOOD ADMINISTERED:none ? ?DRAINS:  1 28 F Chest Tube(s) in the RIGHT ANTERIOR CHEST   ? ?LOCAL MEDICATIONS USED:  NONE ? ?SPECIMEN:  Source of Specimen:  RLL WEDGE ? ?DISPOSITION OF SPECIMEN:  PATHOLOGY ? ?COUNTS:  YES ? ?TOURNIQUET:  * No tourniquets in log * ? ?DICTATION: .Other Dictation: Dictation Number PENDING ? ?PLAN OF CARE: Admit to inpatient  ? ?PATIENT DISPOSITION:  PACU - hemodynamically stable. ?  ?Delay start of Pharmacological VTE agent (>24hrs) due to surgical blood loss or risk of bleeding: NO ? ?COMPLICATIONS: NO KNOWN ? ?

## 2021-05-14 NOTE — Transfer of Care (Signed)
Immediate Anesthesia Transfer of Care Note ? ?Patient: Tyler [name] ? ?Procedure(s) Performed: REPAIR RIGHT CHEST WALL HERNIA WITH MESH (Right: Chest) ? ?Patient Location: PACU ? ?Anesthesia Type:General ? ?Level of Consciousness: awake, alert  and oriented ? ?Airway & Oxygen Therapy: Patient Spontanous Breathing and Patient connected to nasal cannula oxygen ? ?Post-op Assessment: Report given to RN and Post -op Vital signs reviewed and stable ? ?Post vital signs: Reviewed and stable ? ?Last Vitals:  ?Vitals Value Taken Time  ?BP 127/87 05/14/21 1458  ?Temp    ?Pulse 79 05/14/21 1504  ?Resp 13 05/14/21 1504  ?SpO2 99 % 05/14/21 1504  ?Vitals shown include unvalidated device data. ? ?Last Pain:  ?Vitals:  ? 05/14/21 1143  ?TempSrc: Oral  ?   ? ?  ? ?Complications: No notable events documented. ?

## 2021-05-14 NOTE — Op Note (Unsigned)
NAMESWAYZE, PRIES MEDICAL RECORD NO: [id number] ACCOUNT NO: [id number] DATE OF BIRTH: 1950/07/20 FACILITY: [facility] LOCATION: [facility] PHYSICIAN: Revonda Standard. Roxan Hockey, MD  Operative Report   DATE OF PROCEDURE: 05/14/2021  PREOPERATIVE DIAGNOSIS:  Right chest wall hernia.  POSTOPERATIVE DIAGNOSIS:  Right chest wall hernia.  PROCEDURE:  Repair of anterior chest wall hernia with polypropylene mesh.  SURGEON:  Revonda Standard. Roxan Hockey, MD  ASSISTANT: Jadene Pierini.  ANESTHESIA:  General.  FINDINGS: Chest wall hernia, lung adherent to posterior and superior aspects.  CLINICAL NOTE:  The patient is a 70 year old man who has had a minimally invasive mitral repair via right mini thoracotomy approach about a year ago. He developed a lung hernia.  Over time, this has gotten larger and was quite bothersome to him.  We  discussed conservative management, but he is very concerned and wished to have the defect repaired. The plan for repair of the hernia with mesh was discussed with the patient.  He understood and accepted the risks and agreed to proceed.  OPERATIVE NOTE:  The patient was brought to the operating room on 05/14/2021.  He was anesthetized and intubated with a double lumen endotracheal tube.  Sequential compression devices were placed for DVT prophylaxis.  Intravenous antibiotics were  administered.  The chest was prepped and draped in the usual sterile fashion.  The defect was significantly more anterior than the scar from the previous incision, so a second incision was made directly over the hernia.  Single lung ventilation of the  left lung was initiated prior to making the incision.  A timeout was performed.  An incision was made and hemostasis was achieved.  It was carried through the skin and subcutaneous tissue.  The pectoralis muscles were separated and the intercostal  muscles were noted to be very thinned out and there was a relatively wide space between the costal cartilage. The chest  was entered.  There were significant adhesions at the posterior aspect of the hernia and no significant adhesions at all anteriorly.   The adhesions were taken down with electrocautery.  As part of the lower lobe was being mobilized, there was a small tear and a wedge resection was performed removing minimal tissue to repair the pleural tear.  The defect measured 6 x 3 cm.  A  polypropylene mesh was cut to fit the defect.  It was then secured with 2-0 Prolene interrupted sutures.  A drill was used to create holes in the pericardium and ribs for placement of the sutures.  A running 2-0 Prolene suture then was sewn  circumferentially around the patch to ensure that there were no possible gaps for additional herniation. Prior to placing the mesh, a 28-French Blake drain was placed into the chest and secured with a #1 silk suture.  The wound was copiously irrigated  with warm saline.  The pectoralis muscle was reapproximated in 2 layers using #1 Vicryl suture and then subcutaneous tissue and skin were closed in standard fashion.  The chest tube was placed to a Pleur-Evac on waterseal.  The patient was extubated in  the operating room and taken to the postanesthetic care unit in good condition.  All sponge, needle and instrument counts were correct at the end of the procedure.   VAI D: 05/14/2021 5:52:20 pm T: 05/14/2021 10:55:00 pm  JOB: [id number]/ [id number]

## 2021-05-14 NOTE — Progress Notes (Addendum)
Pt arrived to 4E from PACU. CHG bath done. Tele applied, CCMD called. A&O x4VSS. R chest incision c/d/I. Skin intact. CT to water seal, serosanguineous drainage 56m output. Pt oriented to room. Call light in reach. Pt wife at bedside. ? ?ARaelyn Number RN ? ?

## 2021-05-15 ENCOUNTER — Inpatient Hospital Stay (HOSPITAL_COMMUNITY): Payer: PPO

## 2021-05-15 ENCOUNTER — Encounter (HOSPITAL_COMMUNITY): Payer: Self-pay | Admitting: Thoracic Surgery (Cardiothoracic Vascular Surgery)

## 2021-05-15 LAB — CBC
HCT: 39.9 % (ref 39.0–52.0)
Hemoglobin: 13.4 g/dL (ref 13.0–17.0)
MCH: 31.8 pg (ref 26.0–34.0)
MCHC: 33.6 g/dL (ref 30.0–36.0)
MCV: 94.5 fL (ref 80.0–100.0)
Platelets: UNDETERMINED 10*3/uL (ref 150–400)
RBC: 4.22 MIL/uL (ref 4.22–5.81)
RDW: 12.9 % (ref 11.5–15.5)
WBC: 11.7 10*3/uL — ABNORMAL HIGH (ref 4.0–10.5)
nRBC: 0 % (ref 0.0–0.2)

## 2021-05-15 LAB — BASIC METABOLIC PANEL
Anion gap: 6 (ref 5–15)
BUN: 10 mg/dL (ref 8–23)
CO2: 26 mmol/L (ref 22–32)
Calcium: 8.6 mg/dL — ABNORMAL LOW (ref 8.9–10.3)
Chloride: 106 mmol/L (ref 98–111)
Creatinine, Ser: 0.89 mg/dL (ref 0.61–1.24)
GFR, Estimated: >60 mL/min (ref >60)
Glucose, Bld: 179 mg/dL — ABNORMAL HIGH (ref 70–99)
Potassium: 4.1 mmol/L (ref 3.5–5.1)
Sodium: 138 mmol/L (ref 135–145)

## 2021-05-15 LAB — SURGICAL PATHOLOGY

## 2021-05-15 MED ORDER — METOPROLOL SUCCINATE ER 25 MG PO TB24: 25.0000 mg | ORAL_TABLET | Freq: Every day | ORAL | Status: DC

## 2021-05-15 MED ORDER — DICYCLOMINE HCL 10 MG PO CAPS
10.0000 mg | ORAL_CAPSULE | Freq: Every day | ORAL | Status: AC | PRN
Start: 2021-05-15 — End: ?

## 2021-05-15 MED ORDER — PANTOPRAZOLE SODIUM 40 MG PO TBEC: 40.0000 mg | DELAYED_RELEASE_TABLET | Freq: Every day | ORAL | Status: AC

## 2021-05-15 MED ORDER — OXYCODONE HCL 5 MG PO TABS: 5.0000 mg | ORAL_TABLET | Freq: Four times a day (QID) | ORAL | 0 refills | Status: AC | PRN

## 2021-05-15 NOTE — Progress Notes (Signed)
Right chest tube removed, per order. Dry dressing placed. Suture intact. Vitals stable. ?

## 2021-05-15 NOTE — Progress Notes (Addendum)
? ?   ?Tyler [name] [address] ?      [name] [address] ?            [phone]   ? ?  1 Day Post-Op Procedure(s) (LRB): ?REPAIR RIGHT CHEST WALL HERNIA WITH MESH (Right) ?Subjective: ?Minor discomfort, worse with moving ? ?Objective: ?Vital signs in last 24 hours: ?Temp:  [97.5 ?F (36.4 ?C)-98.3 ?F (36.8 ?C)] 97.5 ?F (36.4 ?C) (03/14 0400) ?Pulse Rate:  [65-81] 74 (03/14 0400) ?Cardiac Rhythm: Normal sinus rhythm (03/13 2001) ?Resp:  [10-22] 12 (03/14 0400) ?BP: (117-143)/(72-99) 117/78 (03/14 0400) ?SpO2:  [94 %-100 %] 96 % (03/14 0400) ?Weight:  [67.5 kg] 67.5 kg (03/13 1143) ? ?Hemodynamic parameters for last 24 hours: ?  ? ?Intake/Output from previous day: ?03/13 0701 - 03/14 0700 ?In: 2243.6 [P.O.:480; I.V.:1563.6; IV Piggyback:200] ?Out: 950 [Urine:900; Blood:10; Chest Tube:40] ?Intake/Output this shift: ?No intake/output data recorded. ? ?General appearance: alert, cooperative, and no distress ?Heart: regular rate and rhythm ?Lungs: clear to auscultation bilaterally ?Abdomen: benign ?Extremities: PAS in place, no edema ?Wound: incis healing well ? ?Lab Results: ?Recent Labs  ?  05/15/21 ?0153  ?WBC 11.7*  ?HGB 13.4  ?HCT 39.9  ?PLT PLATELET CLUMPS NOTED ON SMEAR, UNABLE TO ESTIMATE  ? ?BMET:  ?Recent Labs  ?  05/15/21 ?0153  ?NA 138  ?K 4.1  ?CL 106  ?CO2 26  ?GLUCOSE 179*  ?BUN 10  ?CREATININE 0.89  ?CALCIUM 8.6*  ?  ?PT/INR: No results for input(s): LABPROT, INR in the last 72 hours. ?ABG ?   ?Component Value Date/Time  ? PHART 7.47 (H) 05/10/2021 1015  ? HCO3 26.9 05/10/2021 1015  ? TCO2 29 04/11/2020 1408  ? ACIDBASEDEF 1.0 04/11/2020 1207  ? O2SAT 99.2 05/10/2021 1015  ? ?CBG (last 3)  ?No results for input(s): GLUCAP in the last 72 hours. ? ?Meds ?Scheduled Meds: ? acetaminophen  1,000 mg Oral Q6H  ? Or  ? acetaminophen (TYLENOL) oral liquid 160 mg/5 mL  1,000 mg Oral Q6H  ? aspirin EC  81 mg Oral Daily  ? bisacodyl  10 mg Oral Daily  ? buPROPion  300 mg Oral q AM  ? enoxaparin (LOVENOX)  injection  40 mg Subcutaneous Daily  ? escitalopram  5 mg Oral QPM  ? ketorolac  15 mg Intravenous Q6H  ? lisinopril  2.5 mg Oral Daily  ? metoprolol succinate  25 mg Oral Daily  ? pantoprazole  40 mg Oral QAC breakfast  ? rosuvastatin  5 mg Oral Daily  ? senna-docusate  1 tablet Oral QHS  ? traZODone  50 mg Oral QHS  ? ?Continuous Infusions: ? dextrose 5 % and 0.9% NaCl 75 mL/hr at 05/14/21 1959  ? ?PRN Meds:.fluticasone, ondansetron (ZOFRAN) IV, oxyCODONE, polyvinyl alcohol, traMADol ? ?Xrays ?DG Chest Port 1 View ? ?Result Date: 05/14/2021 ?CLINICAL DATA:  Postop EXAM: PORTABLE CHEST 1 VIEW COMPARISON:  Chest x-ray dated May 10, 2021 FINDINGS: Cardiac and mediastinal contours are within normal limits. Prior mitral valve repair. Mild bibasilar atelectasis. No large effusion or pneumothorax. IMPRESSION: Sided chest tube place with effusion or pneumothorax. Electronically Signed   By: Yetta Glassman M.D.   On: 05/14/2021 15:23   ? ?Assessment/Plan: ?S/P Procedure(s) (LRB): ?REPAIR RIGHT CHEST WALL HERNIA WITH MESH (Right) ? ?POD#1 ? ?1 afeb, VSS ?2 sats good on RA ?3 good UOP ?4 normal renal fxn ?5 not anemic ?6 minor leukocytosis ?7 CXR stable appearance, no pntx or effus ?8 CT only  40 cc ?9 routine mobilization, d/c chest tube/IVF, prob home later today ? ? ? LOS: 1 day  ? ? ?John Giovanni PA-C ?Pager [phone] ?05/15/2021 ?  ?Patient seen and examined, agree with above ? ?Revonda Standard Roxan Hockey, MD ?[facility] ?[phone] ? ?

## 2021-05-15 NOTE — Care Management (Signed)
?  Transition of Care (TOC) Screening Note ? ? ?Patient Details  ?Name: Tyler [name] ?Date of Birth: 1950/03/28 ? ? ?Transition of Care (TOC) CM/SW Contact:    ?Carles Collet, RN ?Phone Number: ?05/15/2021, 8:34 AM ? ? ? ?Transition of Care Department [facility]) has reviewed patient and we will continue to monitor patient advancement through interdisciplinary progression rounds.  ?

## 2021-05-15 NOTE — Discharge Instructions (Signed)
Discharge Instructions: ? ?1. You may shower, please wash incisions daily with soap and water and keep dry.  If you wish to cover wounds with dressing you may do so but please keep clean and change daily.  No tub baths or swimming until incisions have completely healed.  If your incisions become red or develop any drainage please call our office at [phone] ? ?2. No Driving until cleared by surgeon office and you are no longer using narcotic pain medications ? ?3. Fever of 101.5 for at least 24 hours with no source, please contact our office at [phone] ? ?4. Activity- up as tolerated, please walk at least 3 times per day.  Avoid strenuous activity, no lifting, pushing, or pulling with your arms over 8-10 lbs for a minimum of 3 weeks ? ?5. If any questions or concerns arise, please do not hesitate to contact our office at [phone]  ?

## 2021-05-15 NOTE — Discharge Summary (Signed)
Physician Discharge Summary  ?Patient ID: ?Tyler [name] ?MRN: [id number] ?DOB/AGE: Jul 13, 1950 71 y.o. ? ?Admit date: 05/14/2021 ?Discharge date: 05/15/2021 ? ?Admission Diagnoses:Incisional right chest wall hernia ? ?Discharge Diagnoses:  ?Principal Problem: ?  Incisional hernia ? ?Patient Active Problem List  ? Diagnosis Date Noted  ? Incisional hernia 05/14/2021  ? S/P minimally invasive mitral valve repair 04/11/2020  ? Mitral regurgitation 12/20/2019  ? VARICES OF OTHER SITES 12/30/2007  ? Personal history of colonic polyps 12/25/2007  ?  ?Discharged Condition: good ? ?HPI: The patient is a 71 year old male who underwent minimally invasive mitral valve repair by Dr. Roxy Manns in February 2022.  He subsequently developed a bulge in the chest wall with coughing at the operative site.  It has enlarged over time.  It was not causing any pain.  The patient requested repair of the hernia.  Dr. Roxan Hockey discussed the operative procedure and the patient was admitted electively. ? ?Hospital course: The patient was admitted electively for repair and on 05/14/2021 he was taken the operating room at which time he underwent repair of anterior chest wall hernia with polypropylene mesh.  He tolerated the procedure well was taken to the postanesthesia care unit in stable condition. ? ?Postoperative hospital course: ? ?The patient has done well.  He is remained afebrile with stable vital signs.  He had no oxygen (supplemental) requirement.  He is making good urine output and renal function has remained within normal limits.  He was noted not to be anemic.  He had a minor postoperative leukocytosis which was felt to be reactive.  His chest tube had minimal drainage on postop day 1 and it was removed.  Chest x-ray has remained stable in appearance with no pneumothorax or significant effusions.  He tolerated a routine advancement activity and diet.  Incision noted to be healing well without evidence of infection.  He was overall felt to  be quite stable for discharge on postop day #1. ? ? ?Consults: None ? ?Significant Diagnostic Studies:  ?DG Chest 2 View ? ?Result Date: 05/15/2021 ?CLINICAL DATA:  Post chest tube removal EXAM: CHEST - 2 VIEW COMPARISON:  Earlier same day FINDINGS: Right chest tube is no longer present. There is similar lucency at the right lung apex although a pleural line is identified. Similar lung aeration. No pleural effusion. Chest wall emphysema. Stable cardiomediastinal contours. IMPRESSION: Post chest tube removal. Persistent lucency at the right lung apex without definite pneumothorax. Electronically Signed   By: Macy Mis M.D.   On: 05/15/2021 12:33  ? ?DG Chest 2 View ? ?Result Date: 05/10/2021 ?CLINICAL DATA:  Preop evaluation for hernia repair EXAM: CHEST - 2 VIEW COMPARISON:  12/26/2020 FINDINGS: Cardiac size is within normal limits. There is possible prosthetic cardiac valve. There are no signs of pulmonary edema or focal pulmonary consolidation. Small linear densities in right parahilar region appear stable suggesting scarring. There is no pleural effusion or pneumothorax. IMPRESSION: No active cardiopulmonary disease. Electronically Signed   By: Elmer Picker M.D.   On: 05/10/2021 16:12  ? ?DG Chest Port 1 View ? ?Result Date: 05/15/2021 ?CLINICAL DATA:  Incisional hernia.  Chest tube. EXAM: PORTABLE CHEST 1 VIEW COMPARISON:  Radiographs 05/14/2021 and 05/10/2021.  CT 03/27/2021. FINDINGS: 0519 hours. Right chest tube remains in place. The heart size and mediastinal contours are stable status post mitral valve repair. There is mild aortic atherosclerosis. There is mild atelectasis at both lung bases appear improved from yesterday. No evidence of significant pneumothorax or pleural  effusion. The bones appear unchanged. IMPRESSION: Interval improved aeration of the lung bases. No evidence of pneumothorax. Electronically Signed   By: Richardean Sale M.D.   On: 05/15/2021 08:29  ? ?DG Chest Port 1  View ? ?Result Date: 05/14/2021 ?CLINICAL DATA:  Postop EXAM: PORTABLE CHEST 1 VIEW COMPARISON:  Chest x-ray dated May 10, 2021 FINDINGS: Cardiac and mediastinal contours are within normal limits. Prior mitral valve repair. Mild bibasilar atelectasis. No large effusion or pneumothorax. IMPRESSION: Sided chest tube place with effusion or pneumothorax. Electronically Signed   By: Yetta Glassman M.D.   On: 05/14/2021 15:23    ?Treatments: surgery:  ?Operative Report  ?  ?DATE OF PROCEDURE: 05/14/2021 ?  ?PREOPERATIVE DIAGNOSIS:  Right chest wall hernia. ?  ?POSTOPERATIVE DIAGNOSIS:  Right chest wall hernia. ?  ?PROCEDURE:  Repair of anterior chest wall hernia with polypropylene mesh. ?  ?SURGEON:  Revonda Standard. Roxan Hockey, MD ?  ?ASSISTANT: Jadene Pierini. ?  ?ANESTHESIA:  General. ?  ?FINDINGS: Chest wall hernia, lung adherent to posterior and superior aspects. ? ? ?Discharge Exam: ? ? ?Blood pressure 123/84, pulse 72, temperature 97.8 ?F (36.6 ?C), temperature source Oral, resp. rate 20, height '5\' 9"'$  (1.753 m), weight 67.5 kg, SpO2 97 %. ? ? ?General appearance: alert, cooperative, and no distress ?Heart: regular rate and rhythm ?Lungs: clear to auscultation bilaterally ?Abdomen: benign ?Extremities: PAS in place, no edema ?Wound: incis healing well ?Disposition: Discharge disposition: 01-Home or Self Care ? ? ? ? ? ? ?Discharge Instructions   ? ? Discharge patient   Complete by: As directed ?  ? Discharge disposition: 01-Home or Self Care  ? Discharge patient date: 05/15/2021  ? ?  ? ?Allergies as of 05/15/2021   ?No Known Allergies ?  ? ?  ?Medication List  ?  ? ?TAKE these medications   ? ?amoxicillin 500 MG tablet ?Commonly known as: AMOXIL ?Take 4 tablets 30-60 minutes before dental procedures ?  ?aspirin EC 81 MG tablet ?Take 1 tablet (81 mg total) by mouth daily. Swallow whole. ?  ?buPROPion 150 MG 24 hr tablet ?Commonly known as: WELLBUTRIN XL ?Take 300 mg by mouth in the morning. ?  ?dicyclomine 10 MG  capsule ?Commonly known as: BENTYL ?Take 1 capsule (10 mg total) by mouth daily as needed for spasms (or abdominal pain/cramping). ?What changed: See the new instructions. ?  ?escitalopram 5 MG tablet ?Commonly known as: LEXAPRO ?Take 5 mg by mouth every evening. ?  ?fluticasone 50 MCG/ACT nasal spray ?Commonly known as: FLONASE ?Place 1 spray into both nostrils daily as needed for allergies. ?  ?hydroxypropyl methylcellulose / hypromellose 2.5 % ophthalmic solution ?Commonly known as: ISOPTO TEARS / GONIOVISC ?Place 1 drop into both eyes 3 (three) times daily as needed (for dryness). ?  ?lisinopril 2.5 MG tablet ?Commonly known as: ZESTRIL ?Take 1 tablet (2.5 mg total) by mouth daily. ?  ?metoprolol succinate 25 MG 24 hr tablet ?Commonly known as: TOPROL-XL ?Take 1 tablet (25 mg total) by mouth daily. ?  ?oxyCODONE 5 MG immediate release tablet ?Commonly known as: Oxy IR/ROXICODONE ?Take 1 tablet (5 mg total) by mouth every 6 (six) hours as needed for up to 5 days for moderate pain. ?  ?pantoprazole 40 MG tablet ?Commonly known as: PROTONIX ?Take 1 tablet (40 mg total) by mouth daily before breakfast. ?  ?rosuvastatin 5 MG tablet ?Commonly known as: CRESTOR ?Take 5 mg by mouth daily. ?  ?sildenafil 100 MG tablet ?Commonly known as: VIAGRA ?Take  100 mg by mouth daily as needed for erectile dysfunction. ?  ?traZODone 50 MG tablet ?Commonly known as: DESYREL ?Take 50 mg by mouth at bedtime. ?  ? ?  ? ? Follow-up Information   ? ? Melrose Nakayama, MD Follow up.   ?Specialty: Cardiothoracic Surgery ?Why: Please see discharge paperwork for follow-up appointment with your surgeon.  On the date you see Dr. Roxan Hockey obtain a chest x-ray at [facility] 1/2-hour prior to appointment.  It is located in the same office complex on the first floor. ?Contact information: ?[address] ?[address] ?[place] [address] ?[phone] ? ? ?  ?  ? ?  ?  ? ?  ? ? ?Signed: ?John Giovanni PA-C ?05/15/2021, 1:00 PM ? ? ?

## 2021-05-15 NOTE — Anesthesia Postprocedure Evaluation (Signed)
Anesthesia Post Note ? ?Patient: Tyler [name] ? ?Procedure(s) Performed: REPAIR RIGHT CHEST WALL HERNIA WITH MESH (Right: Chest) ? ?  ? ?Patient location during evaluation: PACU ?Anesthesia Type: General ?Level of consciousness: awake and alert ?Pain management: pain level controlled ?Vital Signs Assessment: post-procedure vital signs reviewed and stable ?Respiratory status: spontaneous breathing, nonlabored ventilation, respiratory function stable and patient connected to nasal cannula oxygen ?Cardiovascular status: blood pressure returned to baseline and stable ?Postop Assessment: no apparent nausea or vomiting ?Anesthetic complications: no ? ? ?No notable events documented. ? ?Last Vitals:  ?Vitals:  ? 05/15/21 0950 05/15/21 1131  ?BP: 124/76 123/84  ?Pulse: 73 72  ?Resp:  20  ?Temp:  36.6 ?C  ?SpO2: 96% 97%  ?  ?Last Pain:  ?Vitals:  ? 05/15/21 1131  ?TempSrc: Oral  ?PainSc:   ? ? ?  ?  ?  ?  ?  ?  ? ?March Rummage Avi Kerschner ? ? ? ? ?

## 2021-05-16 ENCOUNTER — Encounter: Payer: Self-pay | Admitting: Gastroenterology

## 2021-05-16 ENCOUNTER — Telehealth: Payer: Self-pay

## 2021-05-16 NOTE — Telephone Encounter (Signed)
Patient's procedure for 3/29 at 9:00 has been canceled. Patient will need clearance from surgeon after his hospital follow up on 4/4. Patient made aware & to call back once they have clearance to be rescheduled. ?

## 2021-05-20 NOTE — H&P (Signed)
HPI: [name] returns regarding his right chest wall hernia.   ?  ?Tyler [name] is a 71 year old man who had minimally invasive mitral valve repair by Dr. Roxy Manns in February 2022.  He developed a bulge in the chest wall with coughing at the operative site.  Originally, it started out about the size of a quarter but had enlarged.  I saw him in October.  He was not having any pain.   ?  ?Over time the hernia has continued to enlarge.  He still does not have any pain associated with it.  He is not having any shortness of breath.  He now wishes to have it repaired. ?  ?       ?Past Medical History:  ?Diagnosis Date  ? Adenomatous polyp of colon 02/2004  ? Anemia    ? Anxiety    ? Arthritis    ? BPH (benign prostatic hyperplasia)    ? Colonic varices    ? Degenerative joint disease    ? Depression    ? Depression    ? Fatty liver    ? GERD (gastroesophageal reflux disease)    ? Heart murmur    ? Hemorrhoids    ? Hyperlipidemia    ? Hypertension    ? IBS (irritable bowel syndrome)    ? Prostate cancer [facility]) 2011  ? S/P minimally invasive mitral valve repair 04/11/2020  ?  Complex valvuloplasty including artificial Gore-tex neochord placement x8 with suture plication of posterior commissure and 30 mm Sorin Memo 3D ring annuloplasty via right mini thoracotomy approach  ? Thrombocytopenia ([place])    ? Thyroid nodule    ?  ?         ?Past Surgical History:  ?Procedure Laterality Date  ? BUBBLE STUDY   12/22/2019  ?  Procedure: BUBBLE STUDY;  Surgeon: Geralynn Rile, MD;  Location: [facility];  Service: Cardiovascular;;  ? CARDIAC CATHETERIZATION      ? COLONOSCOPY      ? MITRAL VALVE REPAIR Right 04/11/2020  ?  Procedure: MINIMALLY INVASIVE MITRAL VALVE REPAIR (MVR) USING MEMO 4D RING SIZE 30MM;  Surgeon: Rexene Alberts, MD;  Location: [place];  Service: Open Heart Surgery;  Laterality: Right;  ? PROSTATECTOMY      ? RIGHT/LEFT HEART CATH AND CORONARY ANGIOGRAPHY N/A 12/22/2019  ?  Procedure: RIGHT/LEFT HEART CATH AND  CORONARY ANGIOGRAPHY;  Surgeon: [name], MD;  Location: [place] CV LAB;  Service: Cardiovascular;  Laterality: N/A;  ? TEE WITHOUT CARDIOVERSION N/A 12/22/2019  ?  Procedure: TRANSESOPHAGEAL ECHOCARDIOGRAM (TEE);  Surgeon: Geralynn Rile, MD;  Location: [facility];  Service: Cardiovascular;  Laterality: N/A;  ? TEE WITHOUT CARDIOVERSION N/A 04/11/2020  ?  Procedure: TRANSESOPHAGEAL ECHOCARDIOGRAM (TEE);  Surgeon: Rexene Alberts, MD;  Location: [facility];  Service: Open Heart Surgery;  Laterality: N/A;  ? TONSILLECTOMY      ? TOOTH EXTRACTION   03/07/2020  ? TYMPANOPLASTY   01/2016  ?  ?  ?  ?  ?           ?Current Outpatient Medications  ?Medication Sig Dispense Refill  ? amoxicillin (AMOXIL) 500 MG tablet Take 4 tablets 30-60 minutes before dental procedures 4 tablet 1  ? aspirin EC 81 MG tablet Take 1 tablet (81 mg total) by mouth daily. Swallow whole. 90 tablet 3  ? buPROPion (WELLBUTRIN XL) 150 MG 24 hr tablet Take 300 mg by mouth in the morning.      ?  dicyclomine (BENTYL) 10 MG capsule TAKE 1 CAPSULE BY MOUTH 3 TIMES DAILY AS NEEDED FOR SPASMS (ABD PAIN AND CRAMPING). (Patient taking differently: Take 10 mg by mouth daily as needed for spasms (or abdominal pain/cramping).) 90 capsule 3  ? escitalopram (LEXAPRO) 5 MG tablet Take 5 mg by mouth every evening.       ? hydroxypropyl methylcellulose / hypromellose (ISOPTO TEARS / GONIOVISC) 2.5 % ophthalmic solution Place 1 drop into both eyes 3 (three) times daily as needed (for dryness).       ? metoprolol succinate (TOPROL-XL) 25 MG 24 hr tablet Take 0.5 tablets (12.5 mg total) by mouth daily. (Patient taking differently: Take 25 mg by mouth daily.) 45 tablet 3  ? pantoprazole (PROTONIX) 40 MG tablet TAKE 1 TABLET BY MOUTH EVERY DAY (Patient taking differently: Take 40 mg by mouth daily before breakfast.) 90 tablet 0  ? sildenafil (VIAGRA) 100 MG tablet Take 100 mg by mouth daily as needed for erectile dysfunction.      ? traZODone (DESYREL) 50  MG tablet Take 50 mg by mouth at bedtime.      ? lisinopril (ZESTRIL) 2.5 MG tablet Take 1 tablet (2.5 mg total) by mouth daily. 90 tablet 3  ?  ?No current facility-administered medications for this visit.  ?  ?  ?Physical Exam ?BP 124/78 (BP Location: Left Arm, Patient Position: Sitting)   Pulse 72   Resp 20   Ht '5\' 9"'$  (1.753 m)   Wt 152 lb (68.9 kg)   SpO2 96% Comment: RA  BMI 22.45 kg/m?  ?Well-appearing 71 year old man in no acute distress ?Alert and oriented x3 with no focal motor deficits ?Cardiac regular rate and rhythm no murmur ?Lungs clear bilaterally ?Chest wall defect palpable in third interspace on the right anteriorly, lung bulges with coughing ?Abdomen soft nontender ?No edema ?  ?Diagnostic Tests: ?CT CHEST WITH CONTRAST ?  ?TECHNIQUE: ?Multidetector CT imaging of the chest was performed during ?intravenous contrast administration. ?  ?RADIATION DOSE REDUCTION: This exam was performed according to the ?departmental dose-optimization program which includes automated ?exposure control, adjustment of the mA and/or kV according to ?patient size and/or use of iterative reconstruction technique. ?  ?CONTRAST:  15m ISOVUE-300 IOPAMIDOL (ISOVUE-300) INJECTION 61% ?  ?COMPARISON:  Chest CTA 01/12/2020. ?  ?FINDINGS: ?Cardiovascular: Heart size is normal. There is no significant ?pericardial fluid, thickening or pericardial calcification. There is ?aortic atherosclerosis, as well as atherosclerosis of the great ?vessels of the mediastinum and the coronary arteries, including ?calcified atherosclerotic plaque in the left anterior descending, ?left circumflex and right coronary arteries. Status post mitral ?annuloplasty. ?  ?Mediastinum/Nodes: No pathologically enlarged mediastinal or hilar ?lymph nodes. Esophagus is unremarkable in appearance. No axillary ?lymphadenopathy. ?  ?Lungs/Pleura: Small calcified granulomas are noted in the right ?lower lobe. No other suspicious appearing pulmonary nodules  or ?masses are noted. No acute consolidative airspace disease. No ?pleural effusions. Architectural distortion in the periphery of the ?right upper and middle lobes, favored to reflect areas of chronic ?post infectious or inflammatory scarring, new compared to prior ?study from 01/12/2020. ?  ?Upper Abdomen: Diffuse low attenuation throughout the visualized ?hepatic parenchyma, indicative of hepatic steatosis. Multiple ?nonobstructive calculi are noted within the collecting systems of ?both kidneys measuring up to 3 mm. Low-attenuation lesions in both ?kidneys, compatible with simple cysts, measuring up to 2.7 cm in the ?upper pole of the right kidney. Other subcentimeter low-attenuation ?lesions in the right kidney, too small to characterize, but ?statistically likely to  represent tiny cysts. ?  ?Musculoskeletal: There are no acute displaced fractures or ?aggressive appearing lytic or blastic lesions noted in the ?visualized portions of the skeleton. ?  ?IMPRESSION: ?1. No acute findings are noted in the chest to account for the ?patient's symptoms. ?2. Areas of apparent post infectious or inflammatory scarring noted ?in the periphery of the right upper and right middle lobes. ?3. Old granulomatous disease, as above. ?4. Aortic atherosclerosis, in addition to three-vessel coronary ?artery disease. Assessment for potential risk factor modification, ?dietary therapy or pharmacologic therapy may be warranted, if ?clinically indicated. ?5. Hepatic steatosis. ?6. Nonobstructive calculi in the collecting systems of both kidneys ?measuring up to 3 mm. ?  ?Aortic Atherosclerosis (ICD10-I70.0). ?  ?  ?Electronically Signed ?  By: Vinnie Langton M.D. ?  On: 03/28/2021 09:10 ?I personally reviewed the CT images.  There is a lung herniation at the third interspace on the right. ?  ?Impression: ?Tyler [name] is a 71 year old man with a history of severe mitral regurgitation status post mitral valve repair via right thoracotomy  about a year ago.   ?  ?He now has a chest wall defect with a lung hernia.  He remains very anxious about this.  I again explained to him that this was not of any risk to him.  It is not large enough to be causin

## 2021-05-22 ENCOUNTER — Ambulatory Visit (INDEPENDENT_AMBULATORY_CARE_PROVIDER_SITE_OTHER): Payer: Self-pay | Admitting: *Deleted

## 2021-05-22 ENCOUNTER — Other Ambulatory Visit: Payer: Self-pay

## 2021-05-22 DIAGNOSIS — Z4802 Encounter for removal of sutures: Secondary | ICD-10-CM

## 2021-05-22 NOTE — Progress Notes (Signed)
Patient arrived for nurse visit to remove sutures post-right chest wall hernia repair 3/13 by Dr. Roxan Hockey.  One suture removed with no signs or symptoms of infection noted.  Patient tolerated suture removal well.  Patient and family instructed to keep the incision site clean and dry. Patient and family acknowledged instructions given.  All questions answered.   ?

## 2021-05-30 ENCOUNTER — Encounter: Payer: PPO | Admitting: Gastroenterology

## 2021-06-04 ENCOUNTER — Other Ambulatory Visit: Payer: Self-pay | Admitting: Thoracic Surgery (Cardiothoracic Vascular Surgery)

## 2021-06-04 DIAGNOSIS — K432 Incisional hernia without obstruction or gangrene: Secondary | ICD-10-CM

## 2021-06-04 DIAGNOSIS — Z9889 Other specified postprocedural states: Secondary | ICD-10-CM

## 2021-06-05 ENCOUNTER — Ambulatory Visit (INDEPENDENT_AMBULATORY_CARE_PROVIDER_SITE_OTHER): Payer: Self-pay | Admitting: Thoracic Surgery (Cardiothoracic Vascular Surgery)

## 2021-06-05 ENCOUNTER — Ambulatory Visit
Admission: RE | Admit: 2021-06-05 | Discharge: 2021-06-05 | Disposition: A | Discharge status: Home / Self Care | Payer: PPO | Source: Ambulatory Visit | Attending: Thoracic Surgery (Cardiothoracic Vascular Surgery) | Admitting: Thoracic Surgery (Cardiothoracic Vascular Surgery)

## 2021-06-05 VITALS — BP 120/76 | HR 71 | Resp 20 | Ht 69.0 in | Wt 150.0 lb

## 2021-06-05 DIAGNOSIS — Z4889 Encounter for other specified surgical aftercare: Secondary | ICD-10-CM

## 2021-06-05 DIAGNOSIS — J9811 Atelectasis: Secondary | ICD-10-CM | POA: Diagnosis not present

## 2021-06-05 DIAGNOSIS — K432 Incisional hernia without obstruction or gangrene: Secondary | ICD-10-CM

## 2021-06-05 DIAGNOSIS — Z952 Presence of prosthetic heart valve: Secondary | ICD-10-CM | POA: Diagnosis not present

## 2021-06-05 DIAGNOSIS — Z8719 Personal history of other diseases of the digestive system: Secondary | ICD-10-CM | POA: Diagnosis not present

## 2021-06-05 NOTE — Progress Notes (Signed)
? ?   ?[address] ?      [name] [address] ?            [phone]   ? ? ?HPI: Tyler [name] returns for follow-up after repair of a chest wall hernia. ? ?Tyler [name] is a 71 year old man who developed a chest wall hernia after a minimally invasive mitral repair back in February 2022.  He had bulging with cough.  Over time that enlarged.  He was concerned about that and wanted repaired. ? ?I did a repair of the hernia with mesh on 05/14/2021.  He did well and went home on postoperative day #1. ? ?He has been feeling well.  He does have some mild incisional discomfort.  He was taking Tylenol before he went to bed, but stopped that a couple days ago.  He is not using any narcotics. ? ?Past Medical History:  ?Diagnosis Date  ? Adenomatous polyp of colon 02/2004  ? Anemia   ? Anxiety   ? Arthritis   ? BPH (benign prostatic hyperplasia)   ? Colonic varices   ? Degenerative joint disease   ? Depression   ? Depression   ? Fatty liver   ? GERD (gastroesophageal reflux disease)   ? Heart murmur   ? Hemorrhoids   ? Hyperlipidemia   ? Hypertension   ? IBS (irritable bowel syndrome)   ? Prostate cancer Tyler [name]) 2011  ? S/P minimally invasive mitral valve repair 04/11/2020  ? Complex valvuloplasty including artificial Gore-tex neochord placement x8 with suture plication of posterior commissure and 30 mm Sorin Memo 3D ring annuloplasty via right mini thoracotomy approach  ? Thrombocytopenia ([place])   ? Thyroid nodule   ? ? ?Current Outpatient Medications  ?Medication Sig Dispense Refill  ? amoxicillin (AMOXIL) 500 MG tablet Take 4 tablets 30-60 minutes before dental procedures 4 tablet 1  ? aspirin EC 81 MG tablet Take 1 tablet (81 mg total) by mouth daily. Swallow whole. 90 tablet 3  ? buPROPion (WELLBUTRIN XL) 150 MG 24 hr tablet Take 300 mg by mouth in the morning.    ? dicyclomine (BENTYL) 10 MG capsule Take 1 capsule (10 mg total) by mouth daily as needed for spasms (or abdominal pain/cramping).    ?  escitalopram (LEXAPRO) 5 MG tablet Take 5 mg by mouth every evening.     ? fluticasone (FLONASE) 50 MCG/ACT nasal spray Place 1 spray into both nostrils daily as needed for allergies.    ? hydroxypropyl methylcellulose / hypromellose (ISOPTO TEARS / GONIOVISC) 2.5 % ophthalmic solution Place 1 drop into both eyes 3 (three) times daily as needed (for dryness).     ? lisinopril (ZESTRIL) 2.5 MG tablet Take 1 tablet (2.5 mg total) by mouth daily. 90 tablet 3  ? metoprolol succinate (TOPROL-XL) 25 MG 24 hr tablet Take 1 tablet (25 mg total) by mouth daily.    ? pantoprazole (PROTONIX) 40 MG tablet Take 1 tablet (40 mg total) by mouth daily before breakfast.    ? rosuvastatin (CRESTOR) 5 MG tablet Take 5 mg by mouth daily.    ? sildenafil (VIAGRA) 100 MG tablet Take 100 mg by mouth daily as needed for erectile dysfunction.    ? traZODone (DESYREL) 50 MG tablet Take 50 mg by mouth at bedtime.    ? ?No current facility-administered medications for this visit.  ? ? ?Physical Exam ?BP 120/76 (BP Location: Right Arm, Patient Position: Sitting)   Pulse 71   Resp 20  Ht '5\' 9"'$  (1.753 m)   Wt 150 lb (68 kg)   SpO2 100% Comment: RA  BMI 22.52 kg/m?  ?71 year old man in no acute distress ?Alert and oriented x3 with no focal deficits ?Incision clean dry and intact ?No bulge with coughing ?Lungs clear with equal breath sounds bilaterally ? ?Diagnostic Tests: ?CHEST - 2 VIEW ?  ?COMPARISON:  05/15/2021 ?  ?FINDINGS: ?Mitral valve replacement unchanged in position. Heart size and ?vascularity normal. ?  ?Left lung is clear. Right perihilar linear density is similar to ?prior studies and may be due to scarring or atelectasis. Right ?middle lobe atelectasis has progressed in the interval. ?  ?IMPRESSION: ?Progressive right middle lobe atelectasis. Right perihilar linear ?density likely scarring. ?  ?Mitral valve replacement. ?  ?  ?Electronically Signed ?  By: Franchot Gallo M.D. ?  On: 06/05/2021 12:46 ?I personally reviewed the  chest x-ray images.  I am not convinced there is any difference between his x-ray today and his preop films.  ? ?Impression: ?Tyler [name] is a 71 year old man who had a chest wall hernia after a minimally invasive mitral valve repair about a year ago.  Over time that enlarged and he wanted to have it repaired.  I did a repair of that using mesh on 05/14/2021. ? ?He currently is doing well.  He has some discomfort but is not taking anything for it at this point.  He did not take any narcotics after going home. ? ?Advised him not to lift anything over 10 pounds for another 3 weeks.  After that his activities are unrestricted but he should build and then gradually.  He asked about physical therapy.  I do not really think he needs that.  He can just resume activities as tolerated. ? ?Plan: ?Return in 6 weeks with PA lateral chest x-ray to check on progress ?He knows to call in the meantime if he has any issues ? ?Melrose Nakayama, MD ?[facility] ?[phone] ? ? ? ? ?

## 2021-06-14 DIAGNOSIS — Z8546 Personal history of malignant neoplasm of prostate: Secondary | ICD-10-CM | POA: Diagnosis not present

## 2021-06-21 DIAGNOSIS — Z8546 Personal history of malignant neoplasm of prostate: Secondary | ICD-10-CM | POA: Diagnosis not present

## 2021-06-21 DIAGNOSIS — N5201 Erectile dysfunction due to arterial insufficiency: Secondary | ICD-10-CM | POA: Diagnosis not present

## 2021-06-23 IMAGING — CR DG ELBOW COMPLETE 3+V*L*
4 series · 4 of 4 positions shown · non-contrast
Comparison: None.

CLINICAL DATA: Fall, left elbow pain

EXAM:
LEFT ELBOW - COMPLETE 3+ VIEW

[elbow ap]
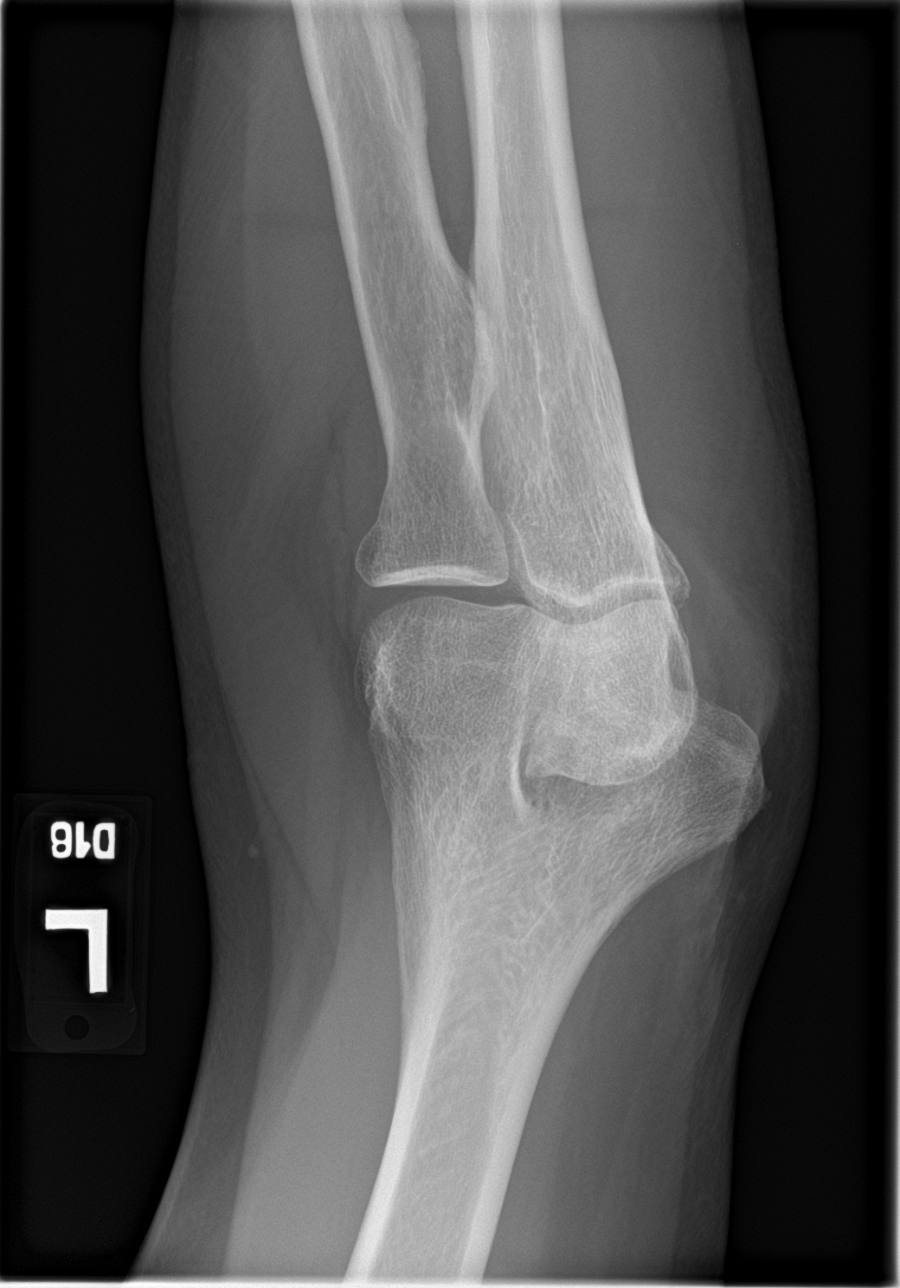

[elbow obl (1 of 2)]
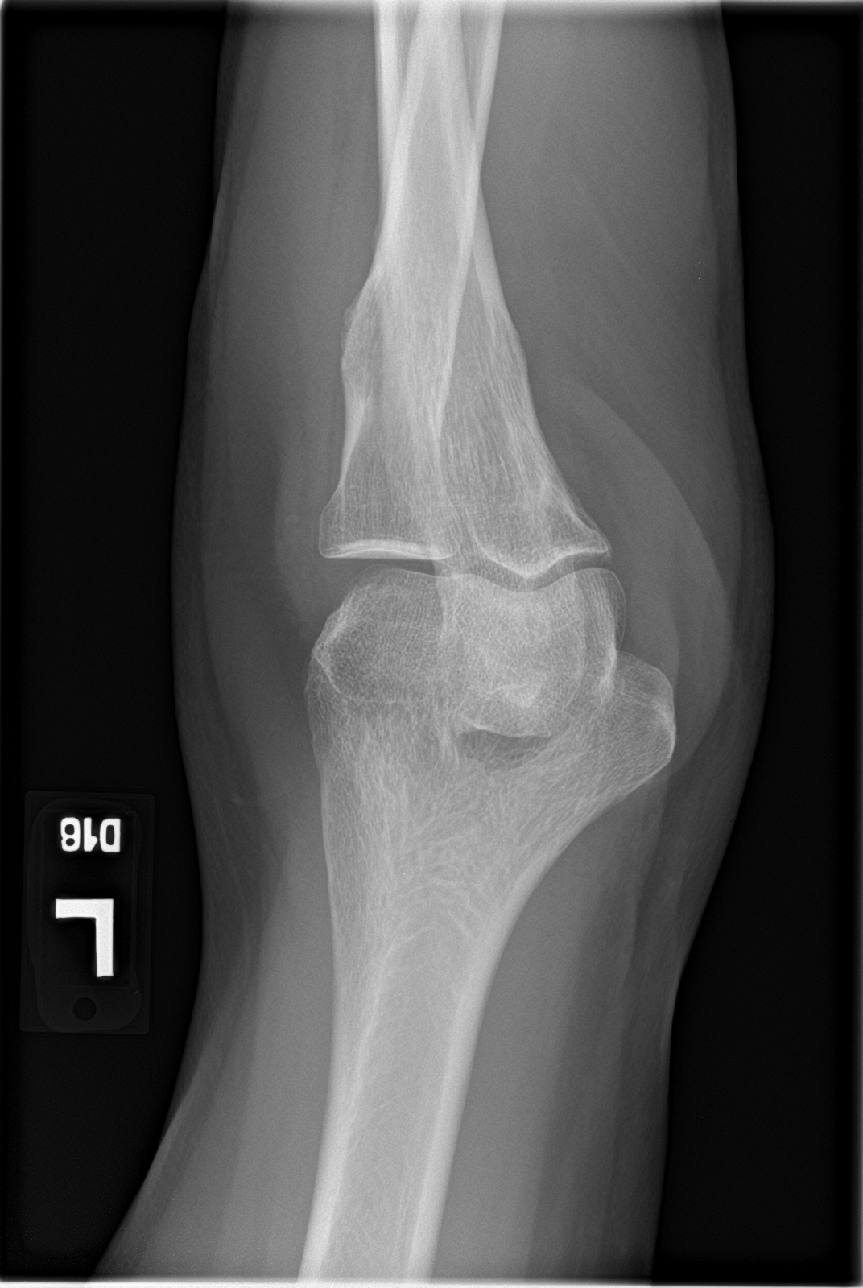

[elbow obl (2 of 2)]
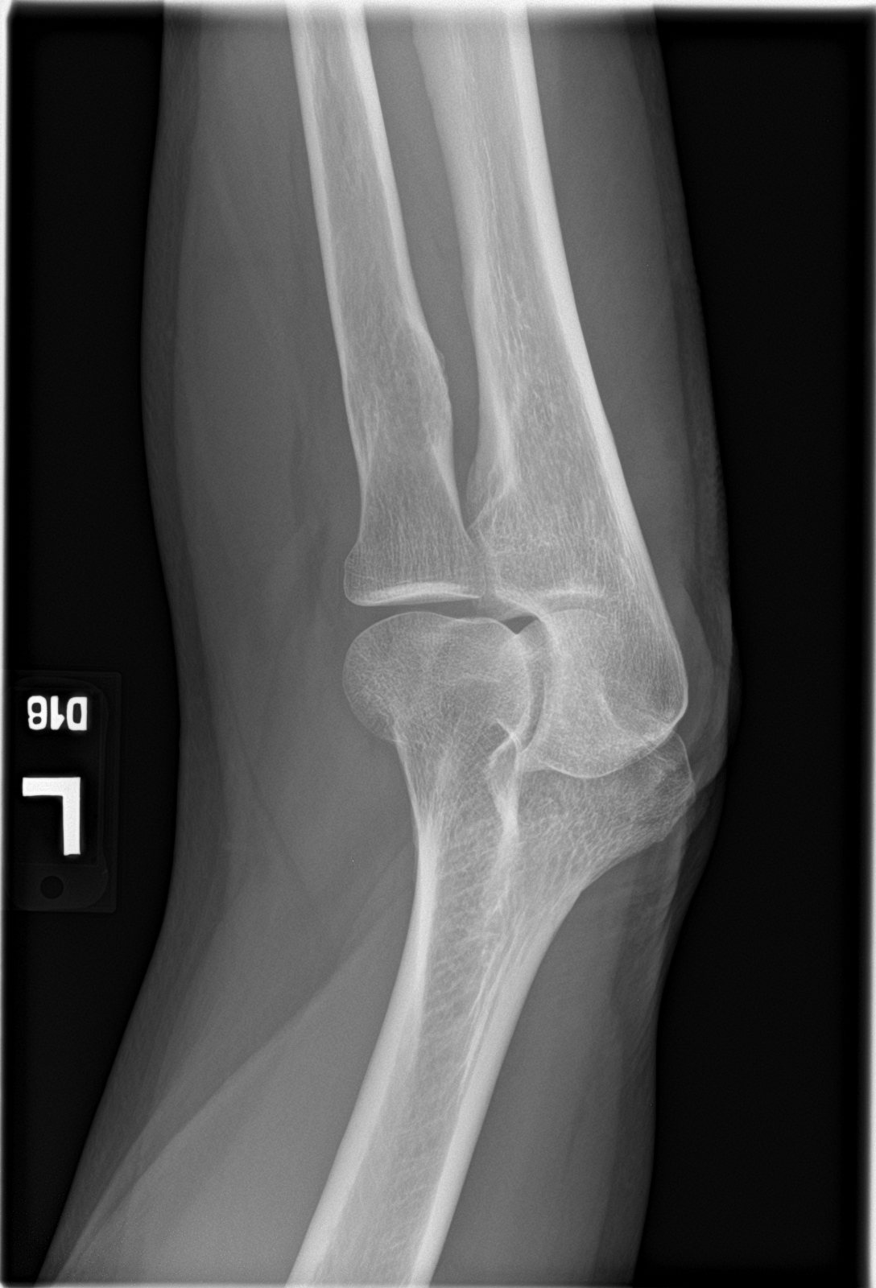

[elbow lat]
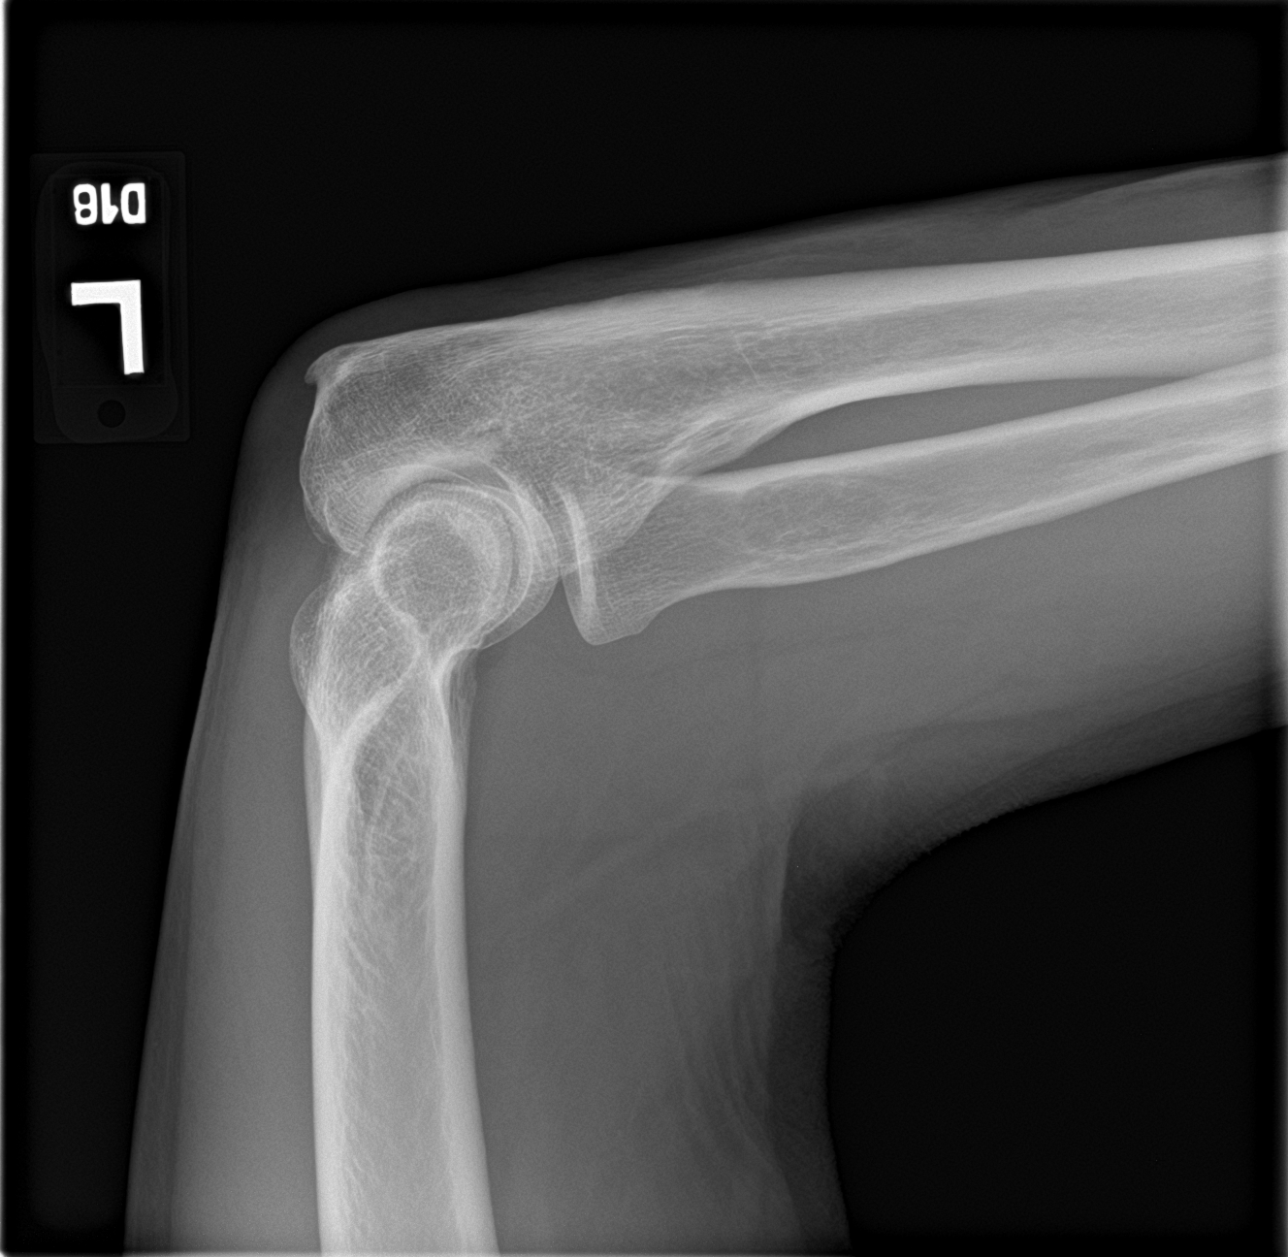

[4 of 4 positions shown; findings below may reference images not displayed]

FINDINGS: There is no evidence of fracture, dislocation, or joint effusion.
There is no evidence of arthropathy or other focal bone abnormality.
Soft tissues are unremarkable.
IMPRESSION: Negative.

## 2021-06-23 IMAGING — CT CT CERVICAL SPINE W/O CM
4 series · 14 of 33 positions shown, 16 images · non-contrast
Comparison: None.

CLINICAL DATA: Head injury tree limb fell on patient

EXAM:
CT HEAD WITHOUT CONTRAST
CT CERVICAL SPINE WITHOUT CONTRAST
TECHNIQUE: Multidetector CT imaging of the head and cervical spine was
performed following the standard protocol without intravenous
contrast. Multiplanar CT image reconstructions of the cervical spine
were also generated.

[Series 4: c spine soft · axial · 0.35mm/px · z∈[+989,+1021]mm · 2 of 116 slices shown]
[im 17/116  soft-tissue]
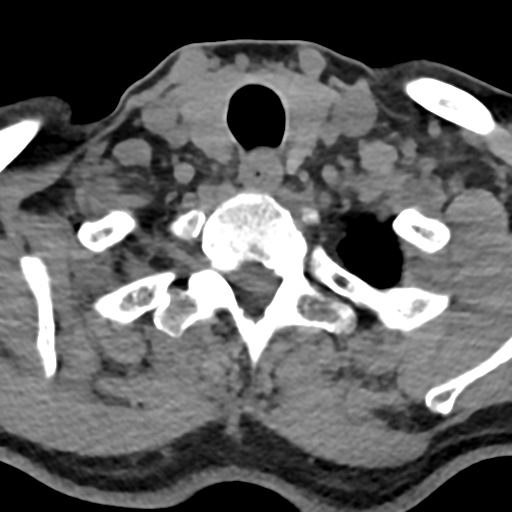
[im 33/116  soft-tissue]
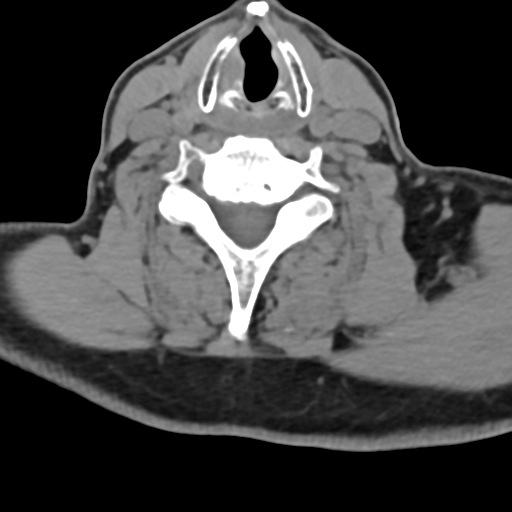

[Series 7: sag bone · sagittal · 0.39mm/px · 5 of 53 slices shown, 6 images]
[im 18/53  bone]
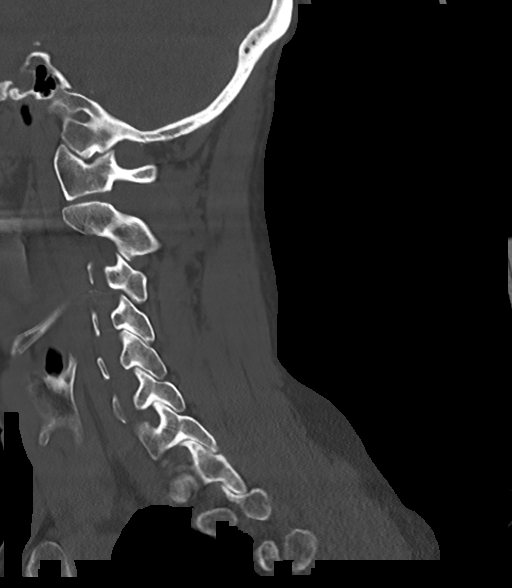
[im 22/53  bone]
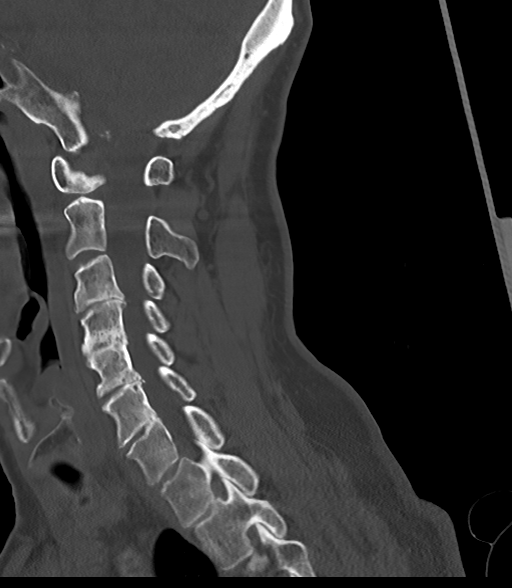
[im 27/53  soft-tissue]
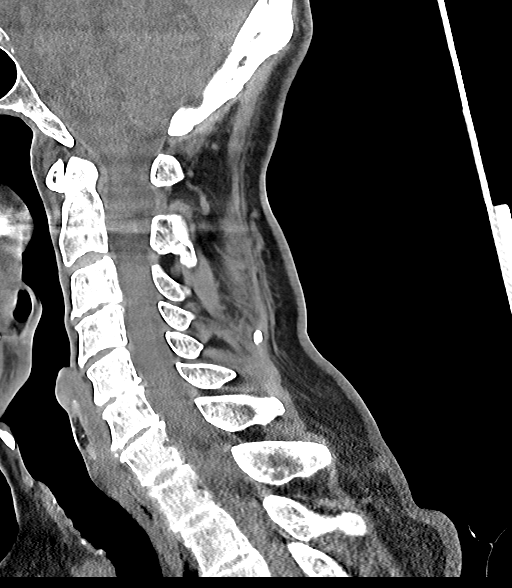
[im 27/53  bone]
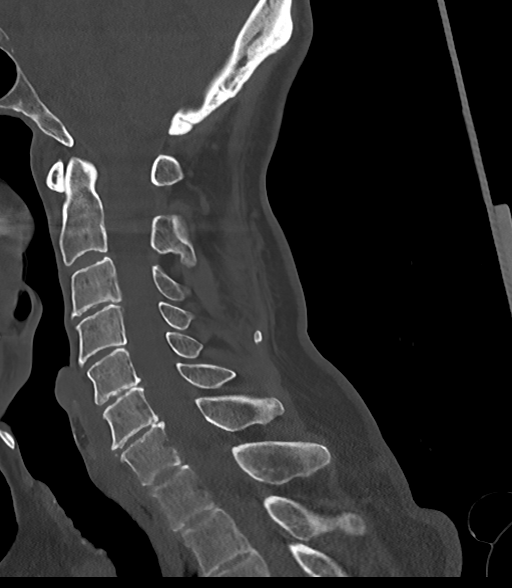
[im 31/53  bone]
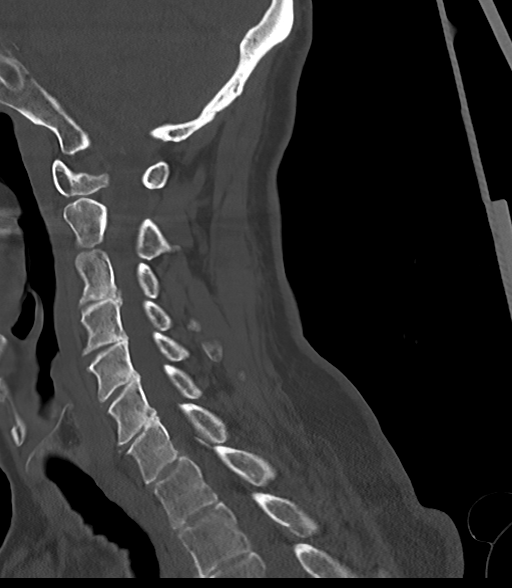
[im 35/53  bone]
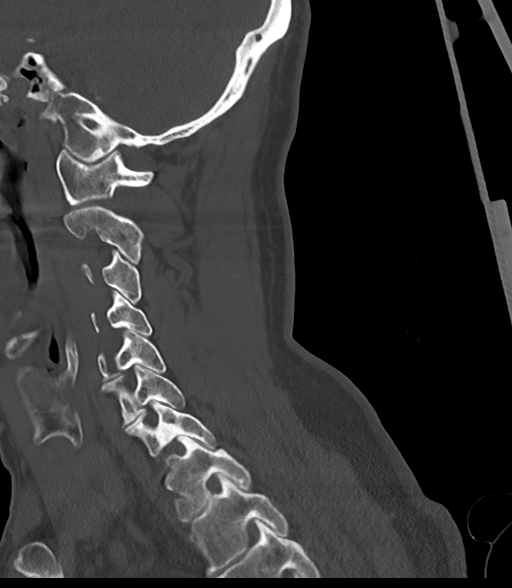

[Series 8: cor bone · coronal · 0.27mm/px · 3 of 62 slices shown]
[im 13/62  bone]
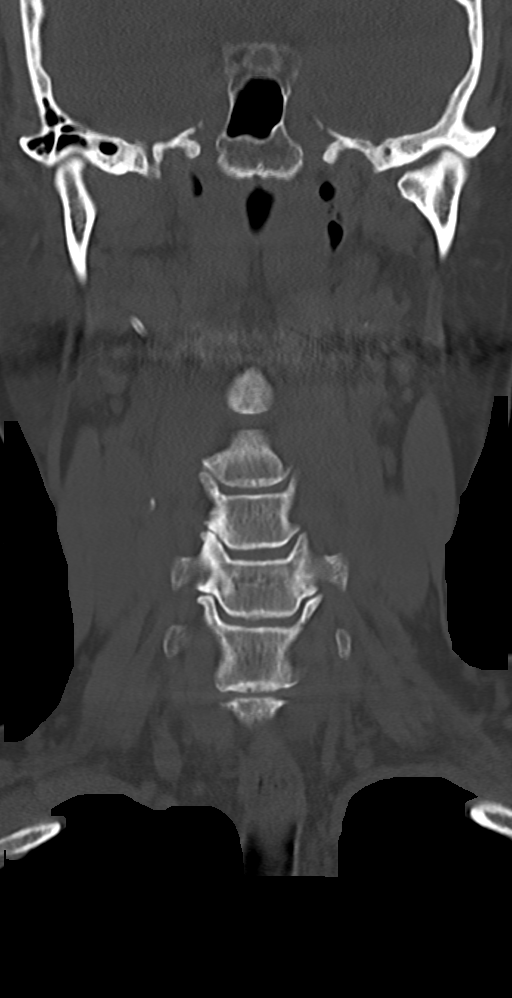
[im 25/62  bone]
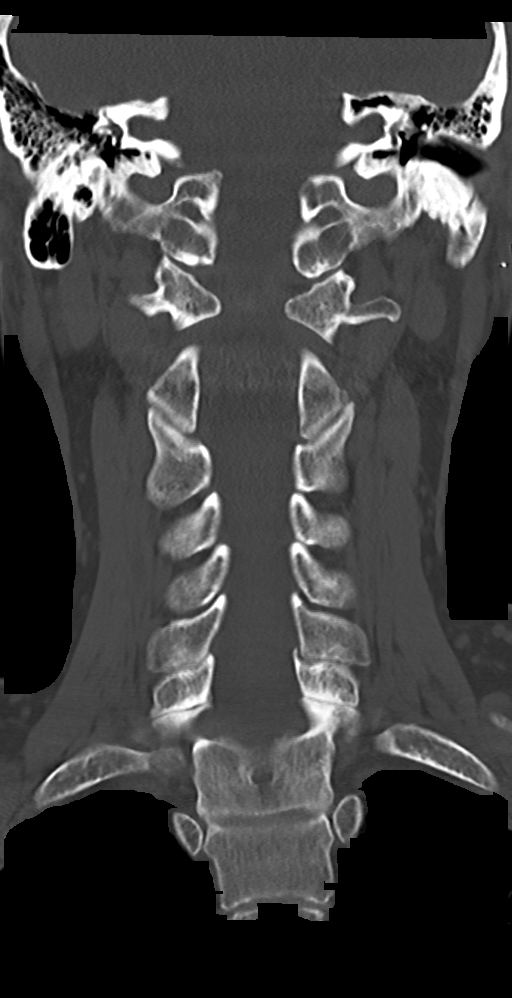
[im 37/62  bone]
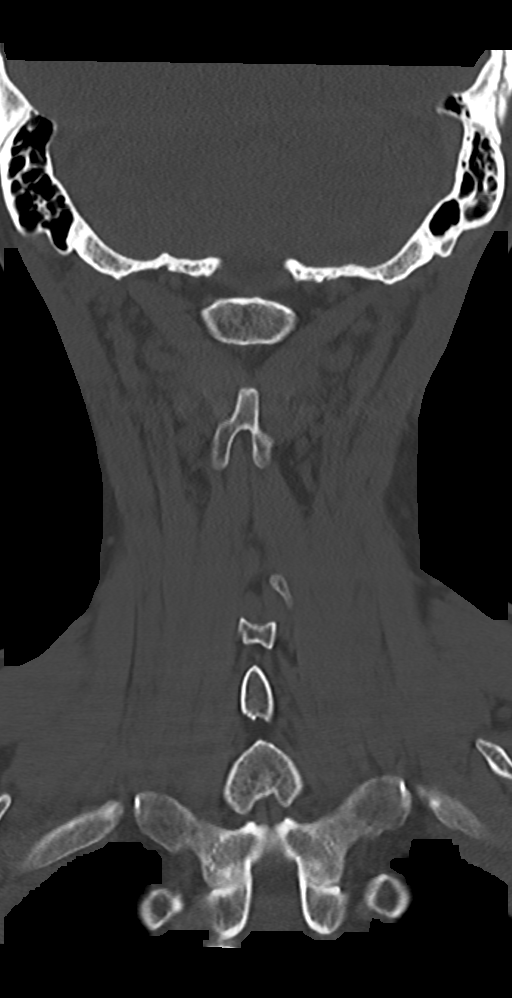

[Series 10: orthogonal axials · axial · 0.21mm/px · z∈[+968,+1086]mm · 4 of 97 slices shown, 5 images]
[im 20/97  soft-tissue]
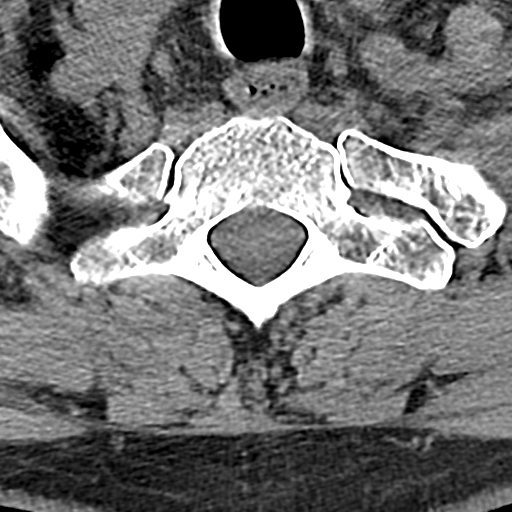
[im 20/97  bone]
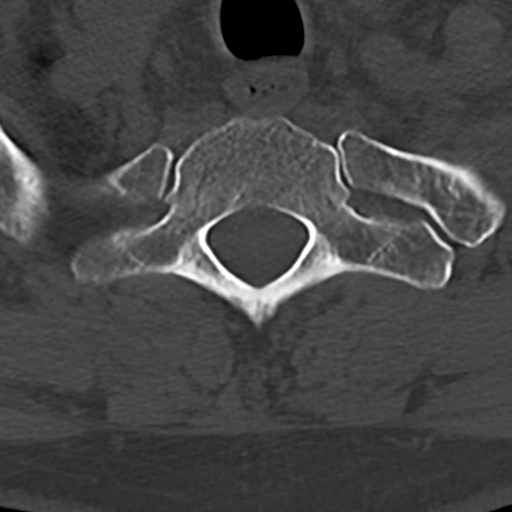
[im 39/97  bone]
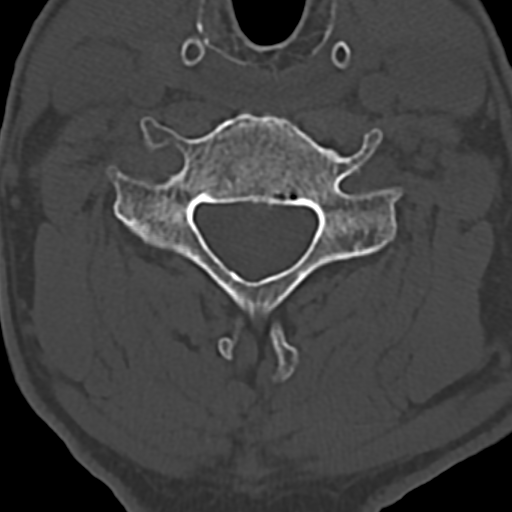
[im 58/97  bone]
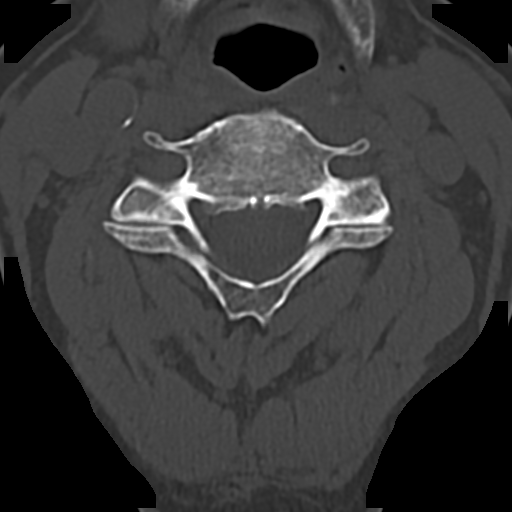
[im 77/97  bone]
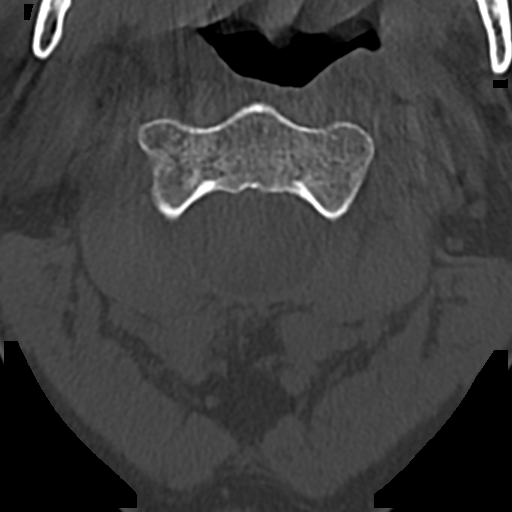

[14 of 33 positions shown; findings below may reference images not displayed]

FINDINGS: CT HEAD FINDINGS

Brain: No evidence of acute infarction, hemorrhage, hydrocephalus,
extra-axial collection or mass lesion/mass effect.

Vascular: No hyperdense vessel or unexpected calcification.

Skull: Normal. Negative for fracture or focal lesion.

Sinuses/Orbits: Mild mucosal thickening in the ethmoid sinuses

Other: None

CT CERVICAL SPINE FINDINGS

Alignment: Normal.

Skull base and vertebrae: No acute fracture. No primary bone lesion
or focal pathologic process.

Soft tissues and spinal canal: No prevertebral fluid or swelling. No
visible canal hematoma.

Disc levels: Moderate diffuse degenerative change C3 through C7 with
diffuse disc space narrowing and small osteophytes. Bilateral
foraminal narrowing at these levels.

Upper chest: Negative.

Other: None
IMPRESSION: 1. Negative non contrasted CT appearance of the brain.
2. Moderate diffuse degenerative changes of the cervical spine. No
acute osseous abnormality.

## 2021-06-28 ENCOUNTER — Other Ambulatory Visit: Payer: Self-pay | Admitting: Cardiovascular Disease

## 2021-07-04 DIAGNOSIS — Z9079 Acquired absence of other genital organ(s): Secondary | ICD-10-CM | POA: Diagnosis not present

## 2021-07-04 DIAGNOSIS — Z8546 Personal history of malignant neoplasm of prostate: Secondary | ICD-10-CM | POA: Diagnosis not present

## 2021-07-04 DIAGNOSIS — I10 Essential (primary) hypertension: Secondary | ICD-10-CM | POA: Diagnosis not present

## 2021-07-10 DIAGNOSIS — D649 Anemia, unspecified: Secondary | ICD-10-CM | POA: Diagnosis not present

## 2021-07-10 DIAGNOSIS — Z125 Encounter for screening for malignant neoplasm of prostate: Secondary | ICD-10-CM | POA: Diagnosis not present

## 2021-07-10 DIAGNOSIS — R7301 Impaired fasting glucose: Secondary | ICD-10-CM | POA: Diagnosis not present

## 2021-07-10 DIAGNOSIS — I10 Essential (primary) hypertension: Secondary | ICD-10-CM | POA: Diagnosis not present

## 2021-07-10 DIAGNOSIS — E785 Hyperlipidemia, unspecified: Secondary | ICD-10-CM | POA: Diagnosis not present

## 2021-07-16 ENCOUNTER — Other Ambulatory Visit: Payer: Self-pay | Admitting: Thoracic Surgery (Cardiothoracic Vascular Surgery)

## 2021-07-16 DIAGNOSIS — K432 Incisional hernia without obstruction or gangrene: Secondary | ICD-10-CM

## 2021-07-16 DIAGNOSIS — Z9889 Other specified postprocedural states: Secondary | ICD-10-CM

## 2021-07-17 ENCOUNTER — Encounter: Payer: Self-pay | Admitting: Thoracic Surgery (Cardiothoracic Vascular Surgery)

## 2021-07-17 ENCOUNTER — Ambulatory Visit
Admission: RE | Admit: 2021-07-17 | Discharge: 2021-07-17 | Disposition: A | Discharge status: Home / Self Care | Payer: PPO | Source: Ambulatory Visit | Attending: Thoracic Surgery (Cardiothoracic Vascular Surgery) | Admitting: Thoracic Surgery (Cardiothoracic Vascular Surgery)

## 2021-07-17 ENCOUNTER — Ambulatory Visit (INDEPENDENT_AMBULATORY_CARE_PROVIDER_SITE_OTHER): Payer: Self-pay | Admitting: Thoracic Surgery (Cardiothoracic Vascular Surgery)

## 2021-07-17 VITALS — BP 127/83 | HR 73 | Resp 20 | Ht 69.0 in | Wt 150.0 lb

## 2021-07-17 DIAGNOSIS — K432 Incisional hernia without obstruction or gangrene: Secondary | ICD-10-CM

## 2021-07-17 DIAGNOSIS — Z952 Presence of prosthetic heart valve: Secondary | ICD-10-CM | POA: Diagnosis not present

## 2021-07-17 DIAGNOSIS — J9811 Atelectasis: Secondary | ICD-10-CM | POA: Diagnosis not present

## 2021-07-17 DIAGNOSIS — Z4889 Encounter for other specified surgical aftercare: Secondary | ICD-10-CM

## 2021-07-17 NOTE — Progress Notes (Signed)
? ?   ?[place].[address] ?      [name] [address] ?            [phone]   ? ?HPI: Mr. Caradine returns for scheduled postoperative follow-up visit ? ?Tyler [name] is a 71 year old man who had a minimally invasive mitral repair in February 2022.  He developed a chest wall hernia that enlarged over time.  We repaired that with mesh on 05/14/2021. ? ?He is feeling well.  He is not having any pain at that area.  He does notice a slight bulge with cough. ? ?Past Medical History:  ?Diagnosis Date  ? Adenomatous polyp of colon 02/2004  ? Anemia   ? Anxiety   ? Arthritis   ? BPH (benign prostatic hyperplasia)   ? Colonic varices   ? Degenerative joint disease   ? Depression   ? Depression   ? Fatty liver   ? GERD (gastroesophageal reflux disease)   ? Heart murmur   ? Hemorrhoids   ? Hyperlipidemia   ? Hypertension   ? IBS (irritable bowel syndrome)   ? Prostate cancer [facility]) 2011  ? S/P minimally invasive mitral valve repair 04/11/2020  ? Complex valvuloplasty including artificial Gore-tex neochord placement x8 with suture plication of posterior commissure and 30 mm Sorin Memo 3D ring annuloplasty via right mini thoracotomy approach  ? Thrombocytopenia ([place])   ? Thyroid nodule   ? ? ?Current Outpatient Medications  ?Medication Sig Dispense Refill  ? amoxicillin (AMOXIL) 500 MG capsule TAKE 4 CAPSULES 30-60 MINUTES BEFORE DENTAL PROCEDURES 4 capsule 1  ? aspirin EC 81 MG tablet Take 1 tablet (81 mg total) by mouth daily. Swallow whole. 90 tablet 3  ? buPROPion (WELLBUTRIN XL) 150 MG 24 hr tablet Take 300 mg by mouth in the morning.    ? dicyclomine (BENTYL) 10 MG capsule Take 1 capsule (10 mg total) by mouth daily as needed for spasms (or abdominal pain/cramping).    ? escitalopram (LEXAPRO) 5 MG tablet Take 5 mg by mouth every evening.     ? fluticasone (FLONASE) 50 MCG/ACT nasal spray Place 1 spray into both nostrils daily as needed for allergies.    ? hydroxypropyl methylcellulose / hypromellose (ISOPTO TEARS  / GONIOVISC) 2.5 % ophthalmic solution Place 1 drop into both eyes 3 (three) times daily as needed (for dryness).     ? lisinopril (ZESTRIL) 2.5 MG tablet TAKE 1 TABLET BY MOUTH EVERY DAY 90 tablet 3  ? metoprolol succinate (TOPROL-XL) 25 MG 24 hr tablet Take 1 tablet (25 mg total) by mouth daily.    ? pantoprazole (PROTONIX) 40 MG tablet Take 1 tablet (40 mg total) by mouth daily before breakfast.    ? rosuvastatin (CRESTOR) 5 MG tablet Take 5 mg by mouth daily.    ? sildenafil (VIAGRA) 100 MG tablet Take 100 mg by mouth daily as needed for erectile dysfunction.    ? traZODone (DESYREL) 50 MG tablet Take 50 mg by mouth at bedtime.    ? ?No current facility-administered medications for this visit.  ? ? ?Physical Exam ?BP 127/83 (BP Location: Right Arm, Patient Position: Sitting)   Pulse 73   Resp 20   Ht '5\' 9"'$  (1.753 m)   Wt 150 lb (68 kg)   SpO2 95% Comment: ra  BMI 22.58 kg/m?  ?71 year old man in no acute distress ?Alert and oriented x3 with no focal deficits ?Lungs clear bilaterally ?Incision well-healed, mild bulge with coughing ? ?Diagnostic Tests: ?  I personally reviewed the chest x-ray.  No concerning findings. ? ?Impression: ?Tyler [name] is a 71 year old man who developed a chest wall hernia after a minimally invasive mitral valve repair.  That was repaired with mesh 2 months ago.  He is doing well currently with no significant pain.  There is a minimal bulge with coughing that may resolve over time as mesh gets more scarred in. ? ?I recommended that he call immediately if he experiences any increase in pain, redness or swelling in that area. ? ?Plan: ?I will be happy to see him again anytime if I can be of any further assistance with his care ? ?Melrose Nakayama, MD ?[facility] ?[phone] ? ? ? ?

## 2021-07-20 DIAGNOSIS — K219 Gastro-esophageal reflux disease without esophagitis: Secondary | ICD-10-CM | POA: Diagnosis not present

## 2021-07-20 DIAGNOSIS — Z8546 Personal history of malignant neoplasm of prostate: Secondary | ICD-10-CM | POA: Diagnosis not present

## 2021-07-20 DIAGNOSIS — I10 Essential (primary) hypertension: Secondary | ICD-10-CM | POA: Diagnosis not present

## 2021-07-20 DIAGNOSIS — Z9889 Other specified postprocedural states: Secondary | ICD-10-CM | POA: Diagnosis not present

## 2021-07-20 DIAGNOSIS — Z0000 Encounter for general adult medical examination without abnormal findings: Secondary | ICD-10-CM | POA: Diagnosis not present

## 2021-07-20 DIAGNOSIS — R7301 Impaired fasting glucose: Secondary | ICD-10-CM | POA: Diagnosis not present

## 2021-07-20 DIAGNOSIS — R251 Tremor, unspecified: Secondary | ICD-10-CM | POA: Diagnosis not present

## 2021-07-20 DIAGNOSIS — R82998 Other abnormal findings in urine: Secondary | ICD-10-CM | POA: Diagnosis not present

## 2021-07-20 DIAGNOSIS — D696 Thrombocytopenia, unspecified: Secondary | ICD-10-CM | POA: Diagnosis not present

## 2021-07-20 DIAGNOSIS — D126 Benign neoplasm of colon, unspecified: Secondary | ICD-10-CM | POA: Diagnosis not present

## 2021-07-20 DIAGNOSIS — F39 Unspecified mood [affective] disorder: Secondary | ICD-10-CM | POA: Diagnosis not present

## 2021-07-24 ENCOUNTER — Telehealth: Payer: Self-pay

## 2021-07-24 ENCOUNTER — Other Ambulatory Visit: Payer: Self-pay

## 2021-07-24 DIAGNOSIS — Z86010 Personal history of colonic polyps: Secondary | ICD-10-CM

## 2021-07-24 NOTE — Telephone Encounter (Signed)
Patient has been scheduled for PV on 07/27/21 at 11:00 am & colon in [place] with Dr. Fuller Plan on 08/07/21 at 10:30 am. Per patient he is not on any blood thinners or diabetic. Amb ref has been placed.

## 2021-07-24 NOTE — Telephone Encounter (Signed)
-----   Message from Ladene Artist, MD sent at 07/23/2021  5:47 PM EDT ----- He had a follow up visit with his surgeon on May 16th and it appears he is cleared to proceed with colonoscopy. Please contact the patient to schedule. Thanks

## 2021-07-27 ENCOUNTER — Ambulatory Visit (AMBULATORY_SURGERY_CENTER): Payer: Self-pay | Admitting: *Deleted

## 2021-07-27 VITALS — Ht 69.0 in | Wt 151.0 lb

## 2021-07-27 DIAGNOSIS — Z860100 Personal history of colon polyps, unspecified: Secondary | ICD-10-CM

## 2021-07-27 MED ORDER — PLENVU 140 G PO SOLR: 1.0000 | Freq: Once | ORAL | 0 refills | Status: AC

## 2021-07-27 MED ORDER — ONDANSETRON HCL 4 MG PO TABS: 4.0000 mg | ORAL_TABLET | ORAL | 0 refills | Status: DC

## 2021-07-27 NOTE — Progress Notes (Signed)
Patient is here in-person for PV. Patient denies any allergies to eggs or soy. Patient denies any problems with anesthesia/sedation. Patient is not on any oxygen at home. Patient is not taking any diet/weight loss medications or blood thinners. Patient is aware of our care-partner policy. Pt had nausea with preps in the past. He will use Plenvu with Zofran, plenvu medicare coupon given to pt. Pt aware of cost.  EMMI education assigned to the patient for the procedure, sent to [place].

## 2021-08-01 DIAGNOSIS — E785 Hyperlipidemia, unspecified: Secondary | ICD-10-CM | POA: Diagnosis not present

## 2021-08-01 DIAGNOSIS — I10 Essential (primary) hypertension: Secondary | ICD-10-CM | POA: Diagnosis not present

## 2021-08-06 ENCOUNTER — Encounter: Payer: Self-pay | Admitting: Internal Medicine

## 2021-08-07 ENCOUNTER — Encounter: Payer: Self-pay | Admitting: Gastroenterology

## 2021-08-07 ENCOUNTER — Ambulatory Visit (AMBULATORY_SURGERY_CENTER): Payer: PPO | Admitting: Gastroenterology

## 2021-08-07 VITALS — BP 105/65 | HR 70 | Temp 96.9°F | Resp 9 | Ht 69.0 in | Wt 151.0 lb

## 2021-08-07 DIAGNOSIS — Z86010 Personal history of colonic polyps: Secondary | ICD-10-CM | POA: Diagnosis not present

## 2021-08-07 DIAGNOSIS — Z09 Encounter for follow-up examination after completed treatment for conditions other than malignant neoplasm: Secondary | ICD-10-CM | POA: Diagnosis not present

## 2021-08-07 DIAGNOSIS — D123 Benign neoplasm of transverse colon: Secondary | ICD-10-CM | POA: Diagnosis not present

## 2021-08-07 DIAGNOSIS — D124 Benign neoplasm of descending colon: Secondary | ICD-10-CM | POA: Diagnosis not present

## 2021-08-07 DIAGNOSIS — K6389 Other specified diseases of intestine: Secondary | ICD-10-CM | POA: Diagnosis not present

## 2021-08-07 MED ORDER — SODIUM CHLORIDE 0.9 % IV SOLN: 500.0000 mL | Freq: Once | INTRAVENOUS | Status: DC

## 2021-08-07 NOTE — Progress Notes (Signed)
Pt's states no medical or surgical changes since previsit or office visit. 

## 2021-08-07 NOTE — Patient Instructions (Signed)
CLIP CARD Provided  Handouts Provided:  Polyps  YOU HAD AN ENDOSCOPIC PROCEDURE TODAY AT [facility]:   Refer to the procedure report that was given to you for any specific questions about what was found during the examination.  If the procedure report does not answer your questions, please call your gastroenterologist to clarify.  If you requested that your care partner not be given the details of your procedure findings, then the procedure report has been included in a sealed envelope for you to review at your convenience later.  YOU SHOULD EXPECT: Some feelings of bloating in the abdomen. Passage of more gas than usual.  Walking can help get rid of the air that was put into your GI tract during the procedure and reduce the bloating. If you had a lower endoscopy (such as a colonoscopy or flexible sigmoidoscopy) you may notice spotting of blood in your stool or on the toilet paper. If you underwent a bowel prep for your procedure, you may not have a normal bowel movement for a few days.  Please Note:  You might notice some irritation and congestion in your nose or some drainage.  This is from the oxygen used during your procedure.  There is no need for concern and it should clear up in a day or so.  SYMPTOMS TO REPORT IMMEDIATELY:  Following lower endoscopy (colonoscopy or flexible sigmoidoscopy):  Excessive amounts of blood in the stool  Significant tenderness or worsening of abdominal pains  Swelling of the abdomen that is new, acute  Fever of 100F or higher  For urgent or emergent issues, a gastroenterologist can be reached at any hour by calling [phone]. Do not use MyChart messaging for urgent concerns.    DIET:  We do recommend a small meal at first, but then you may proceed to your regular diet.  Drink plenty of fluids but you should avoid alcoholic beverages for 24 hours.  ACTIVITY:  You should plan to take it easy for the rest of today and you should NOT  DRIVE or use heavy machinery until tomorrow (because of the sedation medicines used during the test).    FOLLOW UP: Our staff will call the number listed on your records 24-72 hours following your procedure to check on you and address any questions or concerns that you may have regarding the information given to you following your procedure. If we do not reach you, we will leave a message.  We will attempt to reach you two times.  During this call, we will ask if you have developed any symptoms of COVID 19. If you develop any symptoms (ie: fever, flu-like symptoms, shortness of breath, cough etc.) before then, please call [phone].  If you test positive for Covid 19 in the 2 weeks post procedure, please call and report this information to [place].    If any biopsies were taken you will be contacted by phone or by letter within the next 1-3 weeks.  Please call us at [phone] if you have not heard about the biopsies in 3 weeks.    SIGNATURES/CONFIDENTIALITY: You and/or your care partner have signed paperwork which will be entered into your electronic medical record.  These signatures attest to the fact that that the information above on your After Visit Summary has been reviewed and is understood.  Full responsibility of the confidentiality of this discharge information lies with you and/or your care-partner.

## 2021-08-07 NOTE — Progress Notes (Signed)
History & Physical  Primary Care Physician:  Prince Solian, MD Primary Gastroenterologist: Lucio Edward, MD  CHIEF COMPLAINT:  Personal history of colon polyps   HPI: Tyler [name] is a 71 y.o. male with a personal history of adenomatous colon polyps including a tubulovillous adenoma in 2017 for surveillance colonoscopy.   Past Medical History:  Diagnosis Date   Adenomatous polyp of colon 02/2004   Anemia    Anxiety    Arthritis    BPH (benign prostatic hyperplasia)    Colonic varices    Degenerative joint disease    Depression    Depression    Fatty liver    GERD (gastroesophageal reflux disease)    Heart murmur    Hemorrhoids    Hyperlipidemia    Hypertension    IBS (irritable bowel syndrome)    Prostate cancer ([place]) 2011   S/P minimally invasive mitral valve repair 04/11/2020   Complex valvuloplasty including artificial Gore-tex neochord placement x8 with suture plication of posterior commissure and 30 mm Sorin Memo 3D ring annuloplasty via right mini thoracotomy approach   Thrombocytopenia ([place])    Thyroid nodule     Past Surgical History:  Procedure Laterality Date   BUBBLE STUDY  12/22/2019   Procedure: BUBBLE STUDY;  Surgeon: Geralynn Rile, MD;  Location: [facility];  Service: Cardiovascular;;   CARDIAC CATHETERIZATION     COLONOSCOPY     MASS EXCISION Right 05/14/2021   Procedure: REPAIR RIGHT CHEST WALL HERNIA WITH MESH;  Surgeon: Melrose Nakayama, MD;  Location: [facility];  Service: Thoracic;  Laterality: Right;   MITRAL VALVE REPAIR Right 04/11/2020   Procedure: MINIMALLY INVASIVE MITRAL VALVE REPAIR (MVR) USING MEMO 4D RING SIZE 30MM;  Surgeon: Rexene Alberts, MD;  Location: [facility];  Service: Open Heart Surgery;  Laterality: Right;   PROSTATECTOMY     RIGHT/LEFT HEART CATH AND CORONARY ANGIOGRAPHY N/A 12/22/2019   Procedure: RIGHT/LEFT HEART CATH AND CORONARY ANGIOGRAPHY;  Surgeon: [name], MD;  Location: [facility];   Service: Cardiovascular;  Laterality: N/A;   TEE WITHOUT CARDIOVERSION N/A 12/22/2019   Procedure: TRANSESOPHAGEAL ECHOCARDIOGRAM (TEE);  Surgeon: Geralynn Rile, MD;  Location: [facility];  Service: Cardiovascular;  Laterality: N/A;   TEE WITHOUT CARDIOVERSION N/A 04/11/2020   Procedure: TRANSESOPHAGEAL ECHOCARDIOGRAM (TEE);  Surgeon: Rexene Alberts, MD;  Location: [facility];  Service: Open Heart Surgery;  Laterality: N/A;   TONSILLECTOMY     TOOTH EXTRACTION  03/07/2020   TYMPANOPLASTY  01/2016    Prior to Admission medications   Medication Sig Start Date End Date Taking? Authorizing Provider  aspirin EC 81 MG tablet Take 1 tablet (81 mg total) by mouth daily. Swallow whole. 03/15/21  Yes O'Neal, Cassie Freer, MD  buPROPion (WELLBUTRIN XL) 300 MG 24 hr tablet Take 300 mg by mouth every morning. 07/05/21  Yes [provider]  escitalopram (LEXAPRO) 5 MG tablet Take 5 mg by mouth every evening.    Yes [provider]  fluticasone (FLONASE) 50 MCG/ACT nasal spray Place 1 spray into both nostrils daily as needed for allergies.   Yes [provider]  hydroxypropyl methylcellulose / hypromellose (ISOPTO TEARS / GONIOVISC) 2.5 % ophthalmic solution Place 1 drop into both eyes 3 (three) times daily as needed (for dryness).    Yes [provider]  lisinopril (ZESTRIL) 2.5 MG tablet TAKE 1 TABLET BY MOUTH EVERY DAY 06/28/21  Yes O'Neal, Cassie Freer, MD  metoprolol succinate (TOPROL-XL) 25 MG  24 hr tablet Take 1 tablet (25 mg total) by mouth daily. 05/15/21  Yes Gold, Wayne E, PA-C  ondansetron (ZOFRAN) 4 MG tablet Take 1 tablet (4 mg total) by mouth as directed. Take one Zofran pill 30-60 minutes before each colonoscopy prep dose 07/27/21  Yes Ladene Artist, MD  pantoprazole (PROTONIX) 40 MG tablet Take 1 tablet (40 mg total) by mouth daily before breakfast. 05/15/21  Yes Gold, Wayne E, PA-C  rosuvastatin (CRESTOR) 5 MG tablet Take 5 mg by mouth daily. 04/18/21   Yes [provider]  traZODone (DESYREL) 50 MG tablet Take 50 mg by mouth at bedtime.   Yes [provider]  amoxicillin (AMOXIL) 500 MG capsule TAKE 4 CAPSULES 30-60 MINUTES BEFORE DENTAL PROCEDURES 06/28/21   O'Neal, Cassie Freer, MD  dicyclomine (BENTYL) 10 MG capsule Take 1 capsule (10 mg total) by mouth daily as needed for spasms (or abdominal pain/cramping). 05/15/21   Gold, Wilder Glade, PA-C  sildenafil (VIAGRA) 100 MG tablet Take 100 mg by mouth daily as needed for erectile dysfunction.    [provider]    Current Outpatient Medications  Medication Sig Dispense Refill   aspirin EC 81 MG tablet Take 1 tablet (81 mg total) by mouth daily. Swallow whole. 90 tablet 3   buPROPion (WELLBUTRIN XL) 300 MG 24 hr tablet Take 300 mg by mouth every morning.     escitalopram (LEXAPRO) 5 MG tablet Take 5 mg by mouth every evening.      fluticasone (FLONASE) 50 MCG/ACT nasal spray Place 1 spray into both nostrils daily as needed for allergies.     hydroxypropyl methylcellulose / hypromellose (ISOPTO TEARS / GONIOVISC) 2.5 % ophthalmic solution Place 1 drop into both eyes 3 (three) times daily as needed (for dryness).      lisinopril (ZESTRIL) 2.5 MG tablet TAKE 1 TABLET BY MOUTH EVERY DAY 90 tablet 3   metoprolol succinate (TOPROL-XL) 25 MG 24 hr tablet Take 1 tablet (25 mg total) by mouth daily.     ondansetron (ZOFRAN) 4 MG tablet Take 1 tablet (4 mg total) by mouth as directed. Take one Zofran pill 30-60 minutes before each colonoscopy prep dose 2 tablet 0   pantoprazole (PROTONIX) 40 MG tablet Take 1 tablet (40 mg total) by mouth daily before breakfast.     rosuvastatin (CRESTOR) 5 MG tablet Take 5 mg by mouth daily.     traZODone (DESYREL) 50 MG tablet Take 50 mg by mouth at bedtime.     amoxicillin (AMOXIL) 500 MG capsule TAKE 4 CAPSULES 30-60 MINUTES BEFORE DENTAL PROCEDURES 4 capsule 1   dicyclomine (BENTYL) 10 MG capsule Take 1 capsule (10 mg total) by mouth daily as  needed for spasms (or abdominal pain/cramping).     sildenafil (VIAGRA) 100 MG tablet Take 100 mg by mouth daily as needed for erectile dysfunction.     Current Facility-Administered Medications  Medication Dose Route Frequency Provider Last Rate Last Admin   0.9 %  sodium chloride infusion  500 mL Intravenous Once Ladene Artist, MD        Allergies as of 08/07/2021   (No Known Allergies)    Family History  Problem Relation Age of Onset   Heart disease Mother    Heart disease Father    Colon polyps Father    Stroke Brother    Depression Other        multiple family members   Colon cancer Neg Hx    Esophageal cancer Neg  Hx    Stomach cancer Neg Hx    Celiac disease Neg Hx     Social History   Socioeconomic History   Marital status: Married    Spouse name: Not on file   Number of children: 3   Years of education: 61   Highest education level: Master's degree (e.g., MA, MS, MEng, MEd, MSW, MBA)  Occupational History   Occupation: Retired  Tobacco Use   Smoking status: Never   Smokeless tobacco: Never  Vaping Use   Vaping Use: Never used  Substance and Sexual Activity   Alcohol use: Yes    Alcohol/week: 10.0 standard drinks    Types: 10 Standard drinks or equivalent per week   Drug use: No   Sexual activity: Not on file  Other Topics Concern   Not on file  Social History Narrative   Not on file   Social Determinants of Health   Financial Resource Strain: Not on file  Food Insecurity: Not on file  Transportation Needs: Not on file  Physical Activity: Not on file  Stress: Not on file  Social Connections: Not on file  Intimate Partner Violence: Not on file    Review of Systems:  All systems reviewed an negative except where noted in HPI.  Gen: Denies any fever, chills, sweats, anorexia, fatigue, weakness, malaise, weight loss, and sleep disorder CV: Denies chest pain, angina, palpitations, syncope, orthopnea, PND, peripheral edema, and  claudication. Resp: Denies dyspnea at rest, dyspnea with exercise, cough, sputum, wheezing, coughing up blood, and pleurisy. GI: Denies vomiting blood, jaundice, and fecal incontinence.   Denies dysphagia or odynophagia. GU : Denies urinary burning, blood in urine, urinary frequency, urinary hesitancy, nocturnal urination, and urinary incontinence. MS: Denies joint pain, limitation of movement, and swelling, stiffness, low back pain, extremity pain. Denies muscle weakness, cramps, atrophy.  Derm: Denies rash, itching, dry skin, hives, moles, warts, or unhealing ulcers.  Psych: Denies depression, anxiety, memory loss, suicidal ideation, hallucinations, paranoia, and confusion. Heme: Denies bruising, bleeding, and enlarged lymph nodes. Neuro:  Denies any headaches, dizziness, paresthesias. Endo:  Denies any problems with DM, thyroid, adrenal function.   Physical Exam: General:  Alert, well-developed, in NAD Head:  Normocephalic and atraumatic. Eyes:  Sclera clear, no icterus.   Conjunctiva pink. Ears:  Normal auditory acuity. Mouth:  No deformity or lesions.  Neck:  Supple; no masses . Lungs:  Clear throughout to auscultation.   No wheezes, crackles, or rhonchi. No acute distress. Heart:  Regular rate and rhythm; no murmurs. Abdomen:  Soft, nondistended, nontender. No masses, hepatomegaly. No obvious masses.  Normal bowel .    Rectal:  Deferred   Msk:  Symmetrical without gross deformities.. Pulses:  Normal pulses noted. Extremities:  Without edema. Neurologic:  Alert and  oriented x4;  grossly normal neurologically. Skin:  Intact without significant lesions or rashes. Cervical Nodes:  No significant cervical adenopathy. Psych:  Alert and cooperative. Normal mood and affect.   Impression / Plan:   Personal history of adenomatous colon polyps including a tubulovillous adenoma in 2017 for surveillance colonoscopy.  Pricilla Riffle. Fuller Plan  08/07/2021, 10:30 AM See Shea Evans, [facility], to  contact our on call provider

## 2021-08-07 NOTE — Progress Notes (Signed)
Called to room to assist during endoscopic procedure.  Patient ID and intended procedure confirmed with present staff. Received instructions for my participation in the procedure from the performing physician.  

## 2021-08-07 NOTE — Op Note (Signed)
[facility] Patient Name: Tyler [name] Procedure Date: 08/07/2021 10:24 AM MRN: [id number] Endoscopist: Ladene Artist , MD Age: 71 Referring MD:  Date of Birth: February 09, 1951 Gender: Male Account #: [id number] Procedure:                Colonoscopy Indications:              Surveillance: Personal history of adenomatous                            polyps on last colonoscopy 3 years ago Medicines:                Monitored Anesthesia Care Procedure:                Pre-Anesthesia Assessment:                           - Prior to the procedure, a History and Physical                            was performed, and patient medications and                            allergies were reviewed. The patient's tolerance of                            previous anesthesia was also reviewed. The risks                            and benefits of the procedure and the sedation                            options and risks were discussed with the patient.                            All questions were answered, and informed consent                            was obtained. Prior Anticoagulants: The patient has                            taken no previous anticoagulant or antiplatelet                            agents. ASA Grade Assessment: III - A patient with                            severe systemic disease. After reviewing the risks                            and benefits, the patient was deemed in                            satisfactory condition to undergo the procedure.  After obtaining informed consent, the colonoscope                            was passed under direct vision. Throughout the                            procedure, the patient's blood pressure, pulse, and                            oxygen saturations were monitored continuously. The                            CF [id number] #[id number] was introduced through the anus                            and advanced to the the  cecum, identified by                            appendiceal orifice and ileocecal valve. The                            ileocecal valve, appendiceal orifice, and rectum                            were photographed. The quality of the bowel                            preparation was good. The colonoscopy was performed                            without difficulty. The patient tolerated the                            procedure well. Scope In: 10:38:31 AM Scope Out: 11:01:21 AM Scope Withdrawal Time: 0 hours 17 minutes 43 seconds  Total Procedure Duration: 0 hours 22 minutes 50 seconds  Findings:                 The perianal and digital rectal examinations were                            normal.                           Two sessile polyps were found in the descending                            colon and transverse colon. The polyps were 5 to 8                            mm in size. These polyps were removed with a cold                            snare. Resection and retrieval were complete.  Onoing oozing at both site noted. For hemostasis,                            three hemostatic clips were successfully placed (MR                            conditional). 2 clips on the descending colon site                            and 1 clip on the transverse colon site. There was                            no bleeding at the end of the procedure.                           Large, non-bleeding pancolonic varices were found.                           Multiple areas of moderately congested mucosa was                            found in the entire colon.                           Internal hemorrhoids were found during                            retroflexion. The hemorrhoids were small and Grade                            I (internal hemorrhoids that do not prolapse).                           The exam was otherwise without abnormality on                            direct and  retroflexion views. Complications:            No immediate complications. Estimated blood loss:                            None. Estimated Blood Loss:     Estimated blood loss: none. Impression:               - Two 5 to 8 mm polyps in the descending colon and                            in the transverse colon, removed with a cold snare.                            Resected and retrieved. Clips (MR conditional) were                            placed for oozing at the polypectomy sites.                           -  Pancolonic varices.                           - Congested mucosa in the entire examined colon.                           - Internal hemorrhoids.                           - The examination was otherwise normal on direct                            and retroflexion views. Recommendation:           - Repeat colonoscopy date to be determined after                            pending pathology results are reviewed for                            surveillance based on pathology results.                           - Patient has a contact number available for                            emergencies. The signs and symptoms of potential                            delayed complications were discussed with the                            patient. Return to normal activities tomorrow.                            Written discharge instructions were provided to the                            patient.                           - Resume previous diet.                           - Continue present medications.                           - Await pathology results.                           - No aspirin, ibuprofen, naproxen, or other                            non-steroidal anti-inflammatory drugs for 3 weeks                            after polyp removal. Ladene Artist, MD 08/07/2021  11:12:52 AM This report has been signed electronically.

## 2021-08-07 NOTE — Progress Notes (Signed)
VSS, transported to PACU °

## 2021-08-08 ENCOUNTER — Telehealth: Payer: Self-pay | Admitting: *Deleted

## 2021-08-08 DIAGNOSIS — Z8546 Personal history of malignant neoplasm of prostate: Secondary | ICD-10-CM | POA: Diagnosis not present

## 2021-08-08 DIAGNOSIS — K219 Gastro-esophageal reflux disease without esophagitis: Secondary | ICD-10-CM | POA: Diagnosis not present

## 2021-08-08 DIAGNOSIS — Z9079 Acquired absence of other genital organ(s): Secondary | ICD-10-CM | POA: Diagnosis not present

## 2021-08-08 NOTE — Telephone Encounter (Signed)
  Follow up Call-     08/07/2021   10:01 AM  Call back number  Post procedure Call Back phone  # [phone]  Permission to leave phone message Yes     Patient questions:  Do you have a fever, pain , or abdominal swelling? No. Pain Score  0 *  Have you tolerated food without any problems? Yes.    Have you been able to return to your normal activities? Yes.    Do you have any questions about your discharge instructions: Diet   No. Medications  No. Follow up visit  No.  Do you have questions or concerns about your Care? No.  Actions: * If pain score is 4 or above: No action needed, pain <4.

## 2021-08-14 ENCOUNTER — Encounter: Payer: Self-pay | Admitting: Gastroenterology

## 2022-01-11 ENCOUNTER — Other Ambulatory Visit: Payer: Self-pay | Admitting: Cardiovascular Disease

## 2022-01-21 DIAGNOSIS — D126 Benign neoplasm of colon, unspecified: Secondary | ICD-10-CM | POA: Diagnosis not present

## 2022-01-21 DIAGNOSIS — K219 Gastro-esophageal reflux disease without esophagitis: Secondary | ICD-10-CM | POA: Diagnosis not present

## 2022-01-21 DIAGNOSIS — F39 Unspecified mood [affective] disorder: Secondary | ICD-10-CM | POA: Diagnosis not present

## 2022-01-21 DIAGNOSIS — E785 Hyperlipidemia, unspecified: Secondary | ICD-10-CM | POA: Diagnosis not present

## 2022-01-21 DIAGNOSIS — Z8546 Personal history of malignant neoplasm of prostate: Secondary | ICD-10-CM | POA: Diagnosis not present

## 2022-01-21 DIAGNOSIS — D696 Thrombocytopenia, unspecified: Secondary | ICD-10-CM | POA: Diagnosis not present

## 2022-01-21 DIAGNOSIS — Z23 Encounter for immunization: Secondary | ICD-10-CM | POA: Diagnosis not present

## 2022-01-21 DIAGNOSIS — Z9889 Other specified postprocedural states: Secondary | ICD-10-CM | POA: Diagnosis not present

## 2022-01-21 DIAGNOSIS — R251 Tremor, unspecified: Secondary | ICD-10-CM | POA: Diagnosis not present

## 2022-01-21 DIAGNOSIS — I10 Essential (primary) hypertension: Secondary | ICD-10-CM | POA: Diagnosis not present

## 2022-01-21 DIAGNOSIS — R7301 Impaired fasting glucose: Secondary | ICD-10-CM | POA: Diagnosis not present

## 2022-01-21 DIAGNOSIS — M25511 Pain in right shoulder: Secondary | ICD-10-CM | POA: Diagnosis not present

## 2022-01-21 DIAGNOSIS — R42 Dizziness and giddiness: Secondary | ICD-10-CM | POA: Diagnosis not present

## 2022-02-01 DIAGNOSIS — M25511 Pain in right shoulder: Secondary | ICD-10-CM | POA: Diagnosis not present

## 2022-03-20 ENCOUNTER — Other Ambulatory Visit: Payer: Self-pay | Admitting: Cardiovascular Disease

## 2022-06-04 ENCOUNTER — Other Ambulatory Visit: Payer: Self-pay | Admitting: Cardiovascular Disease

## 2022-06-19 ENCOUNTER — Other Ambulatory Visit: Payer: Self-pay

## 2022-06-19 MED ORDER — ROSUVASTATIN CALCIUM 5 MG PO TABS: 5.0000 mg | ORAL_TABLET | Freq: Every day | ORAL | 2 refills | Status: DC

## 2022-06-26 ENCOUNTER — Other Ambulatory Visit: Payer: Self-pay | Admitting: Cardiovascular Disease

## 2022-07-28 ENCOUNTER — Other Ambulatory Visit: Payer: Self-pay | Admitting: Cardiovascular Disease

## 2022-08-07 ENCOUNTER — Other Ambulatory Visit: Payer: Self-pay | Admitting: Cardiovascular Disease

## 2022-08-19 DIAGNOSIS — Z1212 Encounter for screening for malignant neoplasm of rectum: Secondary | ICD-10-CM | POA: Diagnosis not present

## 2022-08-19 DIAGNOSIS — D649 Anemia, unspecified: Secondary | ICD-10-CM | POA: Diagnosis not present

## 2022-08-22 ENCOUNTER — Other Ambulatory Visit: Payer: Self-pay | Admitting: Cardiovascular Disease

## 2022-08-29 DIAGNOSIS — E785 Hyperlipidemia, unspecified: Secondary | ICD-10-CM | POA: Diagnosis not present

## 2022-08-29 DIAGNOSIS — R7301 Impaired fasting glucose: Secondary | ICD-10-CM | POA: Diagnosis not present

## 2022-08-29 DIAGNOSIS — R946 Abnormal results of thyroid function studies: Secondary | ICD-10-CM | POA: Diagnosis not present

## 2022-08-29 DIAGNOSIS — Z125 Encounter for screening for malignant neoplasm of prostate: Secondary | ICD-10-CM | POA: Diagnosis not present

## 2022-08-29 DIAGNOSIS — D649 Anemia, unspecified: Secondary | ICD-10-CM | POA: Diagnosis not present

## 2022-08-29 DIAGNOSIS — K219 Gastro-esophageal reflux disease without esophagitis: Secondary | ICD-10-CM | POA: Diagnosis not present

## 2022-08-29 DIAGNOSIS — I10 Essential (primary) hypertension: Secondary | ICD-10-CM | POA: Diagnosis not present

## 2022-09-01 DIAGNOSIS — D649 Anemia, unspecified: Secondary | ICD-10-CM | POA: Diagnosis not present

## 2022-09-01 DIAGNOSIS — Z1212 Encounter for screening for malignant neoplasm of rectum: Secondary | ICD-10-CM | POA: Diagnosis not present

## 2022-09-02 DIAGNOSIS — S46219A Strain of muscle, fascia and tendon of other parts of biceps, unspecified arm, initial encounter: Secondary | ICD-10-CM | POA: Diagnosis not present

## 2022-09-02 DIAGNOSIS — Z0000 Encounter for general adult medical examination without abnormal findings: Secondary | ICD-10-CM | POA: Diagnosis not present

## 2022-09-02 DIAGNOSIS — R0789 Other chest pain: Secondary | ICD-10-CM | POA: Diagnosis not present

## 2022-09-02 DIAGNOSIS — D696 Thrombocytopenia, unspecified: Secondary | ICD-10-CM | POA: Diagnosis not present

## 2022-09-02 DIAGNOSIS — R251 Tremor, unspecified: Secondary | ICD-10-CM | POA: Diagnosis not present

## 2022-09-02 DIAGNOSIS — Z8546 Personal history of malignant neoplasm of prostate: Secondary | ICD-10-CM | POA: Diagnosis not present

## 2022-09-02 DIAGNOSIS — Z9889 Other specified postprocedural states: Secondary | ICD-10-CM | POA: Diagnosis not present

## 2022-09-02 DIAGNOSIS — F39 Unspecified mood [affective] disorder: Secondary | ICD-10-CM | POA: Diagnosis not present

## 2022-09-02 DIAGNOSIS — Z1339 Encounter for screening examination for other mental health and behavioral disorders: Secondary | ICD-10-CM | POA: Diagnosis not present

## 2022-09-02 DIAGNOSIS — Z1331 Encounter for screening for depression: Secondary | ICD-10-CM | POA: Diagnosis not present

## 2022-09-02 DIAGNOSIS — M25511 Pain in right shoulder: Secondary | ICD-10-CM | POA: Diagnosis not present

## 2022-09-02 DIAGNOSIS — E785 Hyperlipidemia, unspecified: Secondary | ICD-10-CM | POA: Diagnosis not present

## 2022-09-02 DIAGNOSIS — Z23 Encounter for immunization: Secondary | ICD-10-CM | POA: Diagnosis not present

## 2022-09-02 DIAGNOSIS — I10 Essential (primary) hypertension: Secondary | ICD-10-CM | POA: Diagnosis not present

## 2022-09-02 DIAGNOSIS — R7301 Impaired fasting glucose: Secondary | ICD-10-CM | POA: Diagnosis not present

## 2022-09-02 DIAGNOSIS — R82998 Other abnormal findings in urine: Secondary | ICD-10-CM | POA: Diagnosis not present

## 2022-09-06 NOTE — Progress Notes (Unsigned)
Cardiology Office Note:   Date:  09/06/2022  NAME:  Tyler [name]    MRN: [id number] DOB:  1950-06-12   PCP:  Chilton Greathouse, MD  Cardiologist:  Reatha Harps, MD  Electrophysiologist:  None   Referring MD: Chilton Greathouse, MD   No chief complaint on file.   History of Present Illness:   Tyler [name] is a 72 y.o. male with a hx of  severe MR s/p repair, subclinical LV dysfunction, HTN who presents for follow-up.   ***  Problem List 1. HTN 2. Colonic varices  3. Severe MR -A3 prolapse  -Complex valvuloplasty/30 mm annuloplasty 04/11/2020 -no CAD 4. Subclinical LV dysfunction  -LVEF 40-45% after MV repair  5. Chest wall hernia s/p repair   Past Medical History: Past Medical History:  Diagnosis Date   Adenomatous polyp of colon 02/2004   Anemia    Anxiety    Arthritis    BPH (benign prostatic hyperplasia)    Colonic varices    Degenerative joint disease    Depression    Depression    Fatty liver    GERD (gastroesophageal reflux disease)    Heart murmur    Hemorrhoids    Hyperlipidemia    Hypertension    IBS (irritable bowel syndrome)    Prostate cancer (HCC) 2011   S/P minimally invasive mitral valve repair 04/11/2020   Complex valvuloplasty including artificial Gore-tex neochord placement x8 with suture plication of posterior commissure and 30 mm Sorin Memo 3D ring annuloplasty via right mini thoracotomy approach   Thrombocytopenia (HCC)    Thyroid nodule     Past Surgical History: Past Surgical History:  Procedure Laterality Date   BUBBLE STUDY  12/22/2019   Procedure: BUBBLE STUDY;  Surgeon: Sande Rives, MD;  Location: [facility] ENDOSCOPY;  Service: Cardiovascular;;   CARDIAC CATHETERIZATION     COLONOSCOPY     MASS EXCISION Right 05/14/2021   Procedure: REPAIR RIGHT CHEST WALL HERNIA WITH MESH;  Surgeon: Loreli Slot, MD;  Location: [facility] OR;  Service: Thoracic;  Laterality: Right;   MITRAL VALVE REPAIR Right 04/11/2020   Procedure:  MINIMALLY INVASIVE MITRAL VALVE REPAIR (MVR) USING MEMO 4D RING SIZE ;  Surgeon: Purcell Nails, MD;  Location: [facility] OR;  Service: Open Heart Surgery;  Laterality: Right;   PROSTATECTOMY     RIGHT/LEFT HEART CATH AND CORONARY ANGIOGRAPHY N/A 12/22/2019   Procedure: RIGHT/LEFT HEART CATH AND CORONARY ANGIOGRAPHY;  Surgeon: Tonny Bollman, MD;  Location: [facility] INVASIVE CV LAB;  Service: Cardiovascular;  Laterality: N/A;   TEE WITHOUT CARDIOVERSION N/A 12/22/2019   Procedure: TRANSESOPHAGEAL ECHOCARDIOGRAM (TEE);  Surgeon: Sande Rives, MD;  Location: [facility] ENDOSCOPY;  Service: Cardiovascular;  Laterality: N/A;   TEE WITHOUT CARDIOVERSION N/A 04/11/2020   Procedure: TRANSESOPHAGEAL ECHOCARDIOGRAM (TEE);  Surgeon: Purcell Nails, MD;  Location: [facility] OR;  Service: Open Heart Surgery;  Laterality: N/A;   TONSILLECTOMY     TOOTH EXTRACTION  03/07/2020   TYMPANOPLASTY  01/2016    Current Medications: No outpatient medications have been marked as taking for the 09/12/22 encounter (Appointment) with Sande Rives, MD.     Allergies:    Patient has no known allergies.   Social History: Social History   Socioeconomic History   Marital status: Married    Spouse name: Not on file   Number of children: 3   Years of education: 18   Highest education level: Master's degree (e.g., MA, MS, MEng, MEd, MSW, MBA)  Occupational  History   Occupation: Retired  Tobacco Use   Smoking status: Never   Smokeless tobacco: Never  Vaping Use   Vaping Use: Never used  Substance and Sexual Activity   Alcohol use: Yes    Alcohol/week: 10.0 standard drinks of alcohol    Types: 10 Standard drinks or equivalent per week   Drug use: No   Sexual activity: Not on file  Other Topics Concern   Not on file  Social History Narrative   Not on file   Social Determinants of Health   Financial Resource Strain: Not on file  Food Insecurity: Not on file  Transportation Needs: Not on file  Physical  Activity: Not on file  Stress: Not on file  Social Connections: Not on file     Family History: The patient's family history includes Colon polyps in his father; Depression in an other family member; Heart disease in his father and mother; Stroke in his brother. There is no history of Colon cancer, Esophageal cancer, Stomach cancer, or Celiac disease.  ROS:   All other ROS reviewed and negative. Pertinent positives noted in the HPI.     EKGs/Labs/Other Studies Reviewed:   The following studies were personally reviewed by me today:  EKG:  EKG is *** ordered today.        TTE 05/25/2020  1. Left ventricular ejection fraction, by estimation, is 40 to 45%. The  left ventricle has mildly decreased function. The left ventricle has no  regional wall motion abnormalities. Left ventricular diastolic function  could not be evaluated.   2. Right ventricular systolic function is normal. The right ventricular  size is normal. There is normal pulmonary artery systolic pressure. The  estimated right ventricular systolic pressure is 20.8 mmHg.   3. Left atrial size was mild to moderately dilated.   4. The mitral valve has been repaired/replaced. No evidence of mitral  valve regurgitation. No evidence of mitral stenosis. The mean mitral valve  gradient is 4.0 mmHg with average heart rate of 80 bpm. There is a  prosthetic annuloplasty ring present in  the mitral position. Procedure Date: 04/11/2020.   5. Tricuspid valve regurgitation is mild to moderate.   6. The aortic valve is normal in structure. Aortic valve regurgitation is  not visualized. No aortic stenosis is present.   7. The inferior vena cava is normal in size with greater than 50%  respiratory variability, suggesting right atrial pressure of 3 mmHg.   Recent Labs: No results found for requested labs within last 365 days.   Recent Lipid Panel No results found for: "CHOL", "TRIG", "HDL", "CHOLHDL", "VLDL", "LDLCALC",  "LDLDIRECT"  Physical Exam:   VS:  There were no vitals taken for this visit.   Wt Readings from Last 3 Encounters:  08/07/21 151 lb (68.5 kg)  07/27/21 151 lb (68.5 kg)  07/17/21 150 lb (68 kg)    General: Well nourished, well developed, in no acute distress Head: Atraumatic, normal size  Eyes: PEERLA, EOMI  Neck: Supple, no JVD Endocrine: No thryomegaly Cardiac: Normal S1, S2; RRR; no murmurs, rubs, or gallops Lungs: Clear to auscultation bilaterally, no wheezing, rhonchi or rales  Abd: Soft, nontender, no hepatomegaly  Ext: No edema, pulses 2+ Musculoskeletal: No deformities, BUE and BLE strength normal and equal Skin: Warm and dry, no rashes   Neuro: Alert and oriented to person, place, time, and situation, CNII-XII grossly intact, no focal deficits  Psych: Normal mood and affect   ASSESSMENT:   Yerick  B Hogue is a 72 y.o. male who presents for the following: No diagnosis found.  PLAN:   There are no diagnoses linked to this encounter.  {Are you ordering a CV Procedure (e.g. stress test, cath, DCCV, TEE, etc)?   Press F2        :[id number]}  Disposition: No follow-ups on file.  Medication Adjustments/Labs and Tests Ordered: Current medicines are reviewed at length with the patient today.  Concerns regarding medicines are outlined above.  No orders of the defined types were placed in this encounter.  No orders of the defined types were placed in this encounter.  There are no Patient Instructions on file for this visit.   Time Spent with Patient: I have spent a total of *** minutes with patient reviewing hospital notes, telemetry, EKGs, labs and examining the patient as well as establishing an assessment and plan that was discussed with the patient.  > 50% of time was spent in direct patient care.  Signed, Lenna Gilford. Flora Lipps, MD, [facility]  [facility]  [address] [place], [place] [address] [phone]  09/06/2022 4:06 PM

## 2022-09-12 ENCOUNTER — Encounter: Payer: Self-pay | Admitting: Cardiovascular Disease

## 2022-09-12 ENCOUNTER — Ambulatory Visit: Payer: PPO | Attending: Cardiovascular Disease | Admitting: Cardiovascular Disease

## 2022-09-12 VITALS — BP 112/64 | HR 64 | Ht 69.0 in | Wt 154.0 lb

## 2022-09-12 DIAGNOSIS — I340 Nonrheumatic mitral (valve) insufficiency: Secondary | ICD-10-CM | POA: Diagnosis not present

## 2022-09-12 DIAGNOSIS — I150 Renovascular hypertension: Secondary | ICD-10-CM

## 2022-09-12 DIAGNOSIS — Z9889 Other specified postprocedural states: Secondary | ICD-10-CM

## 2022-09-12 NOTE — Patient Instructions (Addendum)
Medication Instructions:  Your physician recommends that you continue on your current medications as directed. Please refer to the Current Medication list given to you today.  *If you need a refill on your cardiac medications before your next appointment, please call your pharmacy*     Testing/Procedures: Your physician has requested that you have an echocardiogram. Echocardiography is a painless test that uses sound waves to create images of your heart. It provides your doctor with information about the size and shape of your heart and how well your heart's chambers and valves are working. This procedure takes approximately one hour. There are no restrictions for this procedure. Please do NOT wear cologne, perfume, aftershave, or lotions (deodorant is allowed). Please arrive 15 minutes prior to your appointment time.   Follow-Up: At [facility], you and your health needs are our priority.  As part of our continuing mission to provide you with exceptional heart care, we have created designated Provider Care Teams.  These Care Teams include your primary Cardiologist (physician) and Advanced Practice Providers (APPs -  Physician Assistants and Nurse Practitioners) who all work together to provide you with the care you need, when you need it.  We recommend signing up for the patient portal called "MyChart".  Sign up information is provided on this After Visit Summary.  MyChart is used to connect with patients for Virtual Visits (Telemedicine).  Patients are able to view lab/test results, encounter notes, upcoming appointments, etc.  Non-urgent messages can be sent to your provider as well.   To learn more about what you can do with MyChart, go to [email or web address].    Your next appointment:   12 month(s)  Provider:   Reatha Harps, MD

## 2022-09-20 ENCOUNTER — Other Ambulatory Visit: Payer: Self-pay | Admitting: Cardiovascular Disease

## 2022-10-03 ENCOUNTER — Encounter: Payer: Self-pay | Admitting: Neurology

## 2022-10-04 ENCOUNTER — Ambulatory Visit (HOSPITAL_COMMUNITY): Payer: PPO | Attending: Cardiology

## 2022-10-04 DIAGNOSIS — Z9889 Other specified postprocedural states: Secondary | ICD-10-CM | POA: Diagnosis not present

## 2022-10-04 DIAGNOSIS — I340 Nonrheumatic mitral (valve) insufficiency: Secondary | ICD-10-CM | POA: Diagnosis not present

## 2022-10-04 LAB — ECHOCARDIOGRAM COMPLETE
Area-P 1/2: 2.11 cm2
MV VTI: 1.73 cm2
S' Lateral: 3.9 cm

## 2022-10-24 DIAGNOSIS — Z66 Do not resuscitate: Secondary | ICD-10-CM | POA: Diagnosis not present

## 2022-10-24 DIAGNOSIS — I5022 Chronic systolic (congestive) heart failure: Secondary | ICD-10-CM | POA: Diagnosis not present

## 2022-10-24 DIAGNOSIS — F330 Major depressive disorder, recurrent, mild: Secondary | ICD-10-CM | POA: Diagnosis not present

## 2022-10-24 DIAGNOSIS — I700 Atherosclerosis of aorta: Secondary | ICD-10-CM | POA: Diagnosis not present

## 2022-10-24 DIAGNOSIS — Z8546 Personal history of malignant neoplasm of prostate: Secondary | ICD-10-CM | POA: Diagnosis not present

## 2022-10-25 NOTE — Progress Notes (Unsigned)
Assessment/Plan:   1.  Parkinsonism.  I suspect that this does represent idiopathic Parkinson's disease.    -We discussed the diagnosis as well as pathophysiology of the disease.  We discussed treatment options as well as prognostic indicators.  Patient education was provided.  -Greater than 50% of the 60 minute visit was spent in counseling answering questions and talking about what to expect now as well as in the future.  We talked about medication options as well as potential future surgical options.  We talked about safety in the home.  -We decided to add carbidopa/levodopa 25/100.  1/2 tab tid x 1 wk, then 1/2 in am & noon & 1 at night for a week, then 1/2 in am &1 at noon &night for a week, then 1 po tid.  Risks, benefits, side effects and alternative therapies were discussed.  The opportunity to ask questions was given and they were answered to the best of my ability.  The patient expressed understanding and willingness to follow the outlined treatment protocols.  -Offered skin biopsy for alpha-synuclein.  Declined.  -Discussed limiting alcohol intake to no more than 2 days/week.  -We discussed community resources in the area including patient support groups and community exercise programs for PD and pt education was provided to the patient.  2.  History of hypertension  -He is on propranolol and lisinopril.  Discussed with him that we will need to watch these medications closely.  Parkinsons tends to cause dysautonomia.  His propranolol has been helping his tremor some, but he now has some bradycardia and lower blood pressure.  My suspicion is he probably does not need so much lisinopril and propranolol.  Discussed concept of permissive hypertension in Parkinson's.  He will let me know if his primary care physician know if he gets lightheaded/dizzy on levodopa.  His blood pressure medicines may need tapered.      Subjective:   Tyler [name] was seen today in the movement disorders  clinic for neurologic consultation at the request of Avva, Ravisankar, MD.  The consultation is for the evaluation of resting and intention tremor and to r/o Parkinsons Disease.  This patient is accompanied in the office by his spouse who supplements the history. Outside records that were made available to me were reviewed. Records indicate pt has had tremor for some time and his metoprolol was changed to propranolol with benefit but tremor has still been progressive.  He is referred for further evaluation.  Tremor: Yes.     How long has it been going on? 3-4 years +  At rest or with activation?  Activation per pt  When is it noted the most?  Holding a coffee cup  Fam hx of tremor?  No.  Located where?  R hand and R leg mostly and a little in the left leg  Affected by caffeine:  doesn't drink any  Affected by alcohol:  No. (drinks 1-2 drinks of beer/whiskey per day)  Affected by stress:  Yes.    Affected by fatigue:  Yes.    Spills soup if on spoon:  maybe but he would generally steady it with other hand  Spills glass of liquid if full:  No.  Affects ADL's (tying shoes, brushing teeth, etc):  No.  Other Specific Symptoms:  Voice: little softer Sleep:   Vivid Dreams:  No.  Acting out dreams:  No. Wet Pillows: No. Postural symptoms:  Yes.    Falls?  No. Bradykinesia symptoms: no bradykinesia noted  Loss of smell:  No. Loss of taste:  No. Urinary Incontinence:  No. Difficulty Swallowing:  No. Handwriting, micrographia: Yes.   Depression:  Yes.   And anxious Memory changes:  Yes.  , some short term trouble Hallucinations:  No.  visual distortions: No. - just reflections off of side of glasses N/V:  No. Lightheaded:  No.  Syncope: No. Diplopia:  No.    Pt had CT brain in 01/2020 that was unremarkable.   ALLERGIES:  No Known Allergies  CURRENT MEDICATIONS:  Current Outpatient Medications  Medication Instructions   amoxicillin (AMOXIL) 500 MG capsule TAKE 4 CAPSULES 30-60  MINUTES BEFORE DENTAL PROCEDURES   aspirin EC 81 mg, Oral, Daily, Swallow whole.   buPROPion (WELLBUTRIN XL) 300 mg, Oral, Every morning   dicyclomine (BENTYL) 10 mg, Oral, Daily PRN   escitalopram (LEXAPRO) 5 mg, Oral, Every evening   fluticasone (FLONASE) 50 MCG/ACT nasal spray 1 spray, Each Nare, Daily PRN   hydroxypropyl methylcellulose / hypromellose (ISOPTO TEARS / GONIOVISC) 2.5 % ophthalmic solution 1 drop, Both Eyes, 3 times daily PRN   lisinopril (ZESTRIL) 2.5 mg, Oral, Daily   ondansetron (ZOFRAN) 4 mg, Oral, As directed, Take one Zofran pill 30-60 minutes before each colonoscopy prep dose   pantoprazole (PROTONIX) 40 mg, Oral, Daily before breakfast   propranolol (INDERAL) 60 mg, Oral, 3 times daily   rosuvastatin (CRESTOR) 5 mg, Oral, Daily   sildenafil (VIAGRA) 100 mg, Oral, Daily PRN   traZODone (DESYREL) 50 mg, Oral, Daily at bedtime    Objective:   PHYSICAL EXAMINATION:    VITALS:   Vitals:   10/29/22 0927  BP: 123/78  Pulse: 60  SpO2: 98%  Weight: 154 lb 6.4 oz (70 kg)  Height: 5\' 9"  (1.753 m)    GEN:  The patient appears stated age and is in NAD. HEENT:  Normocephalic, atraumatic.  The mucous membranes are moist. The superficial temporal arteries are without ropiness or tenderness. CV: Bradycardic.  Regular. Lungs:  CTAB Neck/HEME:  There are no carotid bruits bilaterally.  Neurological examination:  Orientation: The patient is alert and oriented x3.  Cranial nerves: There is good facial symmetry. There is facial hypomimia.   Extraocular muscles are intact. The visual fields are full to confrontational testing. The speech is fluent and clear. He is very hypophonic.  Soft palate rises symmetrically and there is no tongue deviation. Hearing is intact to conversational tone. Sensation: Sensation is intact to light touch throughout (facial, trunk, extremities). Vibration is intact at the bilateral big toe. There is no extinction with double simultaneous  stimulation.  Motor: Strength is 5/5 in the bilateral upper and lower extremities.   Shoulder shrug is equal and symmetric.  There is no pronator drift. Deep tendon reflexes: Deep tendon reflexes are 2/4 at the bilateral biceps, triceps, brachioradialis, patella and achilles. Plantar responses are downgoing bilaterally.  Movement examination: Tone: There is nl tone in the bilateral upper extremities.  The tone in the lower extremities is nl.  Abnormal movements: there is RUE and bilateral LE rest tremor with distraction Coordination:  There is no decremation with RAM's, with any form of RAMS, including alternating supination and pronation of the forearm, hand opening and closing, finger taps, heel taps and toe taps.  Gait and Station: The patient has no difficulty arising out of a deep-seated chair without the use of the hands. The patient's stride length is good with decreased arm swing bilaterally.  The patient has a neg pull test.  I have reviewed and interpreted the following labs independently   Chemistry      Component Value Date/Time   NA 138 05/15/2021 0153   NA 142 04/28/2020 0924   K 4.1 05/15/2021 0153   CL 106 05/15/2021 0153   CO2 26 05/15/2021 0153   BUN 10 05/15/2021 0153   BUN 15 04/28/2020 0924   CREATININE 0.89 05/15/2021 0153      Component Value Date/Time   CALCIUM 8.6 (L) 05/15/2021 0153   ALKPHOS 53 05/10/2021 0955   AST 24 05/10/2021 0955   ALT 16 05/10/2021 0955   BILITOT 0.7 05/10/2021 0955   BILITOT 0.6 12/10/2019 1115      No results found for: "TSH" Lab Results  Component Value Date   WBC 11.7 (H) 05/15/2021   HGB 13.4 05/15/2021   HCT 39.9 05/15/2021   MCV 94.5 05/15/2021   PLT PLATELET CLUMPS NOTED ON SMEAR, UNABLE TO ESTIMATE 05/15/2021      Total time spent on today's visit was 60 minutes, including both face-to-face time and nonface-to-face time.  Time included that spent on review of records (prior notes available to me/labs/imaging if  pertinent), discussing treatment and goals, answering patient's questions and coordinating care.  Cc:  Chilton Greathouse, MD

## 2022-10-29 ENCOUNTER — Ambulatory Visit: Payer: PPO | Admitting: Neurology

## 2022-10-29 ENCOUNTER — Encounter: Payer: Self-pay | Admitting: Neurology

## 2022-10-29 VITALS — BP 123/78 | HR 60 | Ht 69.0 in | Wt 154.4 lb

## 2022-10-29 DIAGNOSIS — G20A1 Parkinson's disease without dyskinesia, without mention of fluctuations: Secondary | ICD-10-CM

## 2022-10-29 MED ORDER — CARBIDOPA-LEVODOPA 25-100 MG PO TABS: 1.0000 | ORAL_TABLET | Freq: Three times a day (TID) | ORAL | 1 refills | Status: DC

## 2022-10-29 NOTE — Patient Instructions (Signed)
 Start Carbidopa Levodopa as follows: Take 1/2 tablet three times daily, at least 30 minutes before meals (approximately 7am/11am/4pm), for one week Then take 1/2 tablet in the morning, 1/2 tablet in the afternoon, 1 tablet in the evening, at least 30 minutes before meals, for one week Then take 1/2 tablet in the morning, 1 tablet in the afternoon, 1 tablet in the evening, at least 30 minutes before meals, for one week Then take 1 tablet three times daily at 7am/11am/4pm, at least 30 minutes before meals   As a reminder, carbidopa/levodopa can be taken at the same time as a carbohydrate, but we like to have you take your pill either 30 minutes before a protein source or 1 hour after as protein can interfere with carbidopa/levodopa absorption.   SAVE THE DATE!  We are planning a Parkinsons Disease educational symposium at [facility] in [place] on October 11.  More details to come!  If you would like to be added to our email list to get further information, email [email or web address].  We hope to see you there!

## 2022-11-14 DIAGNOSIS — H9212 Otorrhea, left ear: Secondary | ICD-10-CM | POA: Diagnosis not present

## 2022-12-02 DIAGNOSIS — H9212 Otorrhea, left ear: Secondary | ICD-10-CM | POA: Diagnosis not present

## 2022-12-02 DIAGNOSIS — H9193 Unspecified hearing loss, bilateral: Secondary | ICD-10-CM | POA: Diagnosis not present

## 2022-12-24 DIAGNOSIS — H9193 Unspecified hearing loss, bilateral: Secondary | ICD-10-CM | POA: Diagnosis not present

## 2023-03-10 DIAGNOSIS — M79641 Pain in right hand: Secondary | ICD-10-CM | POA: Diagnosis not present

## 2023-03-10 DIAGNOSIS — R0789 Other chest pain: Secondary | ICD-10-CM | POA: Diagnosis not present

## 2023-03-10 DIAGNOSIS — E785 Hyperlipidemia, unspecified: Secondary | ICD-10-CM | POA: Diagnosis not present

## 2023-03-10 DIAGNOSIS — M25511 Pain in right shoulder: Secondary | ICD-10-CM | POA: Diagnosis not present

## 2023-03-10 DIAGNOSIS — F39 Unspecified mood [affective] disorder: Secondary | ICD-10-CM | POA: Diagnosis not present

## 2023-03-10 DIAGNOSIS — G20A1 Parkinson's disease without dyskinesia, without mention of fluctuations: Secondary | ICD-10-CM | POA: Diagnosis not present

## 2023-03-10 DIAGNOSIS — D696 Thrombocytopenia, unspecified: Secondary | ICD-10-CM | POA: Diagnosis not present

## 2023-03-10 DIAGNOSIS — I10 Essential (primary) hypertension: Secondary | ICD-10-CM | POA: Diagnosis not present

## 2023-03-10 DIAGNOSIS — R7301 Impaired fasting glucose: Secondary | ICD-10-CM | POA: Diagnosis not present

## 2023-03-10 DIAGNOSIS — Z9889 Other specified postprocedural states: Secondary | ICD-10-CM | POA: Diagnosis not present

## 2023-03-10 DIAGNOSIS — Z23 Encounter for immunization: Secondary | ICD-10-CM | POA: Diagnosis not present

## 2023-03-10 DIAGNOSIS — K219 Gastro-esophageal reflux disease without esophagitis: Secondary | ICD-10-CM | POA: Diagnosis not present

## 2023-03-12 ENCOUNTER — Other Ambulatory Visit: Payer: Self-pay | Admitting: Cardiovascular Disease

## 2023-04-18 NOTE — Progress Notes (Deleted)
 Assessment/Plan:   1.  Parkinsons Disease  -***Continue carbidopa/levodopa 25/100, 1 tablet 3 times per day  2.  History of hypertension             -He is on propranolol and lisinopril.  Discussed with him that we will need to watch these medications closely.  Parkinsons tends to cause dysautonomia.  His propranolol has been helping his tremor some, but he now has some bradycardia and lower blood pressure.  My suspicion is he probably does not need so much lisinopril and propranolol.  Discussed concept of permissive hypertension in Parkinson's.  He will let me know if his primary care physician know if he gets lightheaded/dizzy on levodopa.  His blood pressure medicines may need tapered. Subjective:   Tyler [name] was seen today in follow up for Parkinsons disease, diagnosed last visit.  My previous records were reviewed prior to todays visit as well as outside records available to me.  Levodopa was initiated last visit.  Patient is tolerating it well.  Pt denies falls.  Pt denies lightheadedness, near syncope.  No hallucinations.  Mood has been good.  We did discuss last visit about limiting alcohol intake to no more than 2 days/week and he reports that ***  Current prescribed movement disorder medications: ***Carbidopa/levodopa 25/100, 1 tablet 3 times per day   PREVIOUS MEDICATIONS: {Parkinson's RX:18200}  ALLERGIES:  No Known Allergies  CURRENT MEDICATIONS:  No outpatient medications have been marked as taking for the 04/22/23 encounter (Appointment) with Adrienne Delay, Octaviano Batty, DO.     Objective:   PHYSICAL EXAMINATION:    VITALS:  There were no vitals filed for this visit.  GEN:  The patient appears stated age and is in NAD. HEENT:  Normocephalic, atraumatic.  The mucous membranes are moist. The superficial temporal arteries are without ropiness or tenderness. CV:  RRR Lungs:  CTAB Neck/HEME:  There are no carotid bruits bilaterally.  Neurological examination:  Orientation:  The patient is alert and oriented x3. Cranial nerves: There is good facial symmetry with*** facial hypomimia. The speech is fluent and clear. Soft palate rises symmetrically and there is no tongue deviation. Hearing is intact to conversational tone. Sensation: Sensation is intact to light touch throughout Motor: Strength is at least antigravity x4.  Movement examination: Tone: There is nl tone in the bilateral upper extremities.  The tone in the lower extremities is nl.  Abnormal movements: there is RUE and bilateral LE rest tremor with distraction Coordination:  There is no decremation with RAM's, with any form of RAMS, including alternating supination and pronation of the forearm, hand opening and closing, finger taps, heel taps and toe taps.  Gait and Station: The patient has no difficulty arising out of a deep-seated chair without the use of the hands. The patient's stride length is good with decreased arm swing bilaterally.  The patient has a neg pull test.      I have reviewed and interpreted the following labs independently    Chemistry      Component Value Date/Time   NA 138 05/15/2021 0153   NA 142 04/28/2020 0924   K 4.1 05/15/2021 0153   CL 106 05/15/2021 0153   CO2 26 05/15/2021 0153   BUN 10 05/15/2021 0153   BUN 15 04/28/2020 0924   CREATININE 0.89 05/15/2021 0153      Component Value Date/Time   CALCIUM 8.6 (L) 05/15/2021 0153   ALKPHOS 53 05/10/2021 0955   AST 24 05/10/2021 0955  ALT 16 05/10/2021 0955   BILITOT 0.7 05/10/2021 0955   BILITOT 0.6 12/10/2019 1115       Lab Results  Component Value Date   WBC 11.7 (H) 05/15/2021   HGB 13.4 05/15/2021   HCT 39.9 05/15/2021   MCV 94.5 05/15/2021   PLT PLATELET CLUMPS NOTED ON SMEAR, UNABLE TO ESTIMATE 05/15/2021    No results found for: "TSH"   Total time spent on today's visit was ***30 minutes, including both face-to-face time and nonface-to-face time.  Time included that spent on review of records (prior  notes available to me/labs/imaging if pertinent), discussing treatment and goals, answering patient's questions and coordinating care.  Cc:  Chilton Greathouse, MD

## 2023-04-22 ENCOUNTER — Ambulatory Visit: Payer: PPO | Admitting: Neurology

## 2023-04-28 ENCOUNTER — Other Ambulatory Visit: Payer: Self-pay | Admitting: Neurology

## 2023-05-02 NOTE — Telephone Encounter (Signed)
 Patient is coming Monday do you want me to hold off on his meds until then

## 2023-05-05 ENCOUNTER — Ambulatory Visit: Payer: PPO | Admitting: Neurology

## 2023-05-05 VITALS — BP 130/62 | HR 80 | Ht 69.0 in | Wt 154.0 lb

## 2023-05-05 DIAGNOSIS — Z789 Other specified health status: Secondary | ICD-10-CM

## 2023-05-05 DIAGNOSIS — G20A1 Parkinson's disease without dyskinesia, without mention of fluctuations: Secondary | ICD-10-CM | POA: Diagnosis not present

## 2023-05-05 DIAGNOSIS — I10 Essential (primary) hypertension: Secondary | ICD-10-CM

## 2023-05-05 DIAGNOSIS — F1090 Alcohol use, unspecified, uncomplicated: Secondary | ICD-10-CM

## 2023-05-05 MED ORDER — CARBIDOPA-LEVODOPA 25-100 MG PO TABS: 1.0000 | ORAL_TABLET | Freq: Three times a day (TID) | ORAL | 1 refills | Status: DC

## 2023-05-05 NOTE — Progress Notes (Signed)
 Assessment/Plan:   1.  Parkinsons Disease  -Continue carbidopa/levodopa 25/100, 1 tablet 3 times per day  -We discussed that it used to be thought that levodopa would increase risk of melanoma but now it is believed that Parkinsons itself likely increases risk of melanoma. he is to get regular skin checks.   2.  History of hypertension             -He is on propranolol and lisinopril.  Discussed with him that we will need to watch these medications closely.  Parkinsons tends to cause dysautonomia.  His propranolol has been helping his tremor some, but he has been bradycardic in past (not today). Discussed concept of permissive hypertension in Parkinson's.  He will let me know if his primary care physician know if he gets lightheaded/dizzy on levodopa.  His blood pressure medicines may need tapered in future.  3.  Alcohol use  -discussed to decrease to no more than 2 days/week.  Currently 2 drinks/day Subjective:   Tyler [name] was seen today in follow up for Parkinsons disease, diagnosed last visit.  My previous records were reviewed prior to todays visit as well as outside records available to me.  Pt with wife who supplements hx.  Levodopa was initiated last visit.  Patient is tolerating it well.  He is moving better with decreased tremor with the initiation of medication.  Pt denies falls.  Pt denies lightheadedness, near syncope.  No hallucinations.  Mood has been good. He was exercising well but got a cold/cough and has not fallen off of the exercise wagon.   We did discuss last visit about limiting alcohol intake to no more than 2 days/week and he reports that he is drinking about 2 drinks/day.    Current prescribed movement disorder medications: Carbidopa/levodopa 25/100, 1 tablet 3 times per day   ALLERGIES:  No Known Allergies  CURRENT MEDICATIONS:  Current Meds  Medication Sig   amoxicillin (AMOXIL) 500 MG capsule TAKE 4 CAPSULES 30-60 MINUTES BEFORE DENTAL PROCEDURES    aspirin EC 81 MG tablet Take 1 tablet (81 mg total) by mouth daily. Swallow whole.   buPROPion (WELLBUTRIN XL) 300 MG 24 hr tablet Take 300 mg by mouth every morning.   dicyclomine (BENTYL) 10 MG capsule Take 1 capsule (10 mg total) by mouth daily as needed for spasms (or abdominal pain/cramping). (Patient taking differently: Take 10 mg by mouth 4 (four) times daily -  before meals and at bedtime.)   escitalopram (LEXAPRO) 5 MG tablet Take 5 mg by mouth every evening.    fluticasone (FLONASE) 50 MCG/ACT nasal spray Place 1 spray into both nostrils daily as needed for allergies.   hydroxypropyl methylcellulose / hypromellose (ISOPTO TEARS / GONIOVISC) 2.5 % ophthalmic solution Place 1 drop into both eyes 3 (three) times daily as needed (for dryness).    lisinopril (ZESTRIL) 2.5 MG tablet Take 1 tablet (2.5 mg total) by mouth daily.   pantoprazole (PROTONIX) 40 MG tablet Take 1 tablet (40 mg total) by mouth daily before breakfast.   propranolol (INDERAL) 60 MG tablet Take 60 mg by mouth daily.   rosuvastatin (CRESTOR) 5 MG tablet TAKE 1 TABLET (5 MG TOTAL) BY MOUTH DAILY.   sildenafil (VIAGRA) 100 MG tablet Take 100 mg by mouth daily as needed for erectile dysfunction.   traZODone (DESYREL) 50 MG tablet Take 50 mg by mouth at bedtime.   [DISCONTINUED] carbidopa-levodopa (SINEMET IR) 25-100 MG tablet Take 1 tablet by mouth 3 (three)  times daily. 7am/11am/4pm     Objective:   PHYSICAL EXAMINATION:    VITALS:   Vitals:   05/05/23 0829  BP: 130/62  Weight: 154 lb (69.9 kg)  Height: 5\' 9"  (1.753 m)    GEN:  The patient appears stated age and is in NAD. HEENT:  Normocephalic, atraumatic.  The mucous membranes are moist. The superficial temporal arteries are without ropiness or tenderness. CV:  RRR Lungs:  CTAB Neck/HEME:  There are no carotid bruits bilaterally.  Neurological examination:  Orientation: The patient is alert and oriented x3. Cranial nerves: There is good facial symmetry  with minimal facial hypomimia. The speech is fluent and clear. Soft palate rises symmetrically and there is no tongue deviation. Hearing is intact to conversational tone. Sensation: Sensation is intact to light touch throughout Motor: Strength is at least antigravity x4.  Movement examination: Tone: There is min increased tone in the RUE Abnormal movements: there is no significant tremor today Coordination:  There is no decremation with RAM's, with any form of RAMS, including alternating supination and pronation of the forearm, hand opening and closing, finger taps, heel taps and toe taps.  Gait and Station: The patient has no difficulty arising out of a deep-seated chair without the use of the hands. The patient's stride length is good with good arm swing today.  The patient has a neg pull test.      I have reviewed and interpreted the following labs independently    Chemistry      Component Value Date/Time   NA 138 05/15/2021 0153   NA 142 04/28/2020 0924   K 4.1 05/15/2021 0153   CL 106 05/15/2021 0153   CO2 26 05/15/2021 0153   BUN 10 05/15/2021 0153   BUN 15 04/28/2020 0924   CREATININE 0.89 05/15/2021 0153      Component Value Date/Time   CALCIUM 8.6 (L) 05/15/2021 0153   ALKPHOS 53 05/10/2021 0955   AST 24 05/10/2021 0955   ALT 16 05/10/2021 0955   BILITOT 0.7 05/10/2021 0955   BILITOT 0.6 12/10/2019 1115       Lab Results  Component Value Date   WBC 11.7 (H) 05/15/2021   HGB 13.4 05/15/2021   HCT 39.9 05/15/2021   MCV 94.5 05/15/2021   PLT PLATELET CLUMPS NOTED ON SMEAR, UNABLE TO ESTIMATE 05/15/2021    No results found for: "TSH"   Total time spent on today's visit was 30 minutes, including both face-to-face time and nonface-to-face time.  Time included that spent on review of records (prior notes available to me/labs/imaging if pertinent), discussing treatment and goals, answering patient's questions and coordinating care.  Cc:  Chilton Greathouse, MD

## 2023-05-05 NOTE — Patient Instructions (Signed)
 Increase exercise Decrease alcohol to no more than 2 days per week  As we discussed, it used to be thought that levodopa would increase risk of melanoma but now it is believed that Parkinsons itself likely increases risk of melanoma. I recommend yearly skin checks with a board certified dermatologist.    [facility] Locations: [facility] [address] [place], [place] [address] [phone]  [facility] Address: [address] [place], [place], [place] [address] Phone: [phone]  [facility] [address] [place], [place], [place] Phone: [phone]  [facility] [address], [place], [place] [address] Phone: [phone]  [place]: [facility] [address], [place], [place] [address] Phone: [phone]  [facility] [address] [place], [place], [place] [address] Phone: [phone]  [place]: [facility] [address] [place], [place] [address] Phone: [phone]

## 2023-06-04 DIAGNOSIS — D225 Melanocytic nevi of trunk: Secondary | ICD-10-CM | POA: Diagnosis not present

## 2023-06-04 DIAGNOSIS — Z1283 Encounter for screening for malignant neoplasm of skin: Secondary | ICD-10-CM | POA: Diagnosis not present

## 2023-07-24 DIAGNOSIS — S51812A Laceration without foreign body of left forearm, initial encounter: Secondary | ICD-10-CM | POA: Diagnosis not present

## 2023-07-24 DIAGNOSIS — W19XXXA Unspecified fall, initial encounter: Secondary | ICD-10-CM | POA: Diagnosis not present

## 2023-07-24 DIAGNOSIS — S41112A Laceration without foreign body of left upper arm, initial encounter: Secondary | ICD-10-CM | POA: Diagnosis not present

## 2023-08-05 ENCOUNTER — Ambulatory Visit: Payer: PPO | Admitting: Neurology

## 2023-08-14 ENCOUNTER — Other Ambulatory Visit: Payer: Self-pay | Admitting: Cardiovascular Disease

## 2023-08-22 DIAGNOSIS — H04123 Dry eye syndrome of bilateral lacrimal glands: Secondary | ICD-10-CM | POA: Diagnosis not present

## 2023-08-22 DIAGNOSIS — H2513 Age-related nuclear cataract, bilateral: Secondary | ICD-10-CM | POA: Diagnosis not present

## 2023-08-22 DIAGNOSIS — H43813 Vitreous degeneration, bilateral: Secondary | ICD-10-CM | POA: Diagnosis not present

## 2023-09-08 DIAGNOSIS — E785 Hyperlipidemia, unspecified: Secondary | ICD-10-CM | POA: Diagnosis not present

## 2023-09-08 DIAGNOSIS — I10 Essential (primary) hypertension: Secondary | ICD-10-CM | POA: Diagnosis not present

## 2023-09-08 DIAGNOSIS — Z8546 Personal history of malignant neoplasm of prostate: Secondary | ICD-10-CM | POA: Diagnosis not present

## 2023-09-08 DIAGNOSIS — R7301 Impaired fasting glucose: Secondary | ICD-10-CM | POA: Diagnosis not present

## 2023-09-08 DIAGNOSIS — R946 Abnormal results of thyroid function studies: Secondary | ICD-10-CM | POA: Diagnosis not present

## 2023-09-08 DIAGNOSIS — K219 Gastro-esophageal reflux disease without esophagitis: Secondary | ICD-10-CM | POA: Diagnosis not present

## 2023-09-08 DIAGNOSIS — D649 Anemia, unspecified: Secondary | ICD-10-CM | POA: Diagnosis not present

## 2023-09-15 DIAGNOSIS — Z1331 Encounter for screening for depression: Secondary | ICD-10-CM | POA: Diagnosis not present

## 2023-09-15 DIAGNOSIS — D696 Thrombocytopenia, unspecified: Secondary | ICD-10-CM | POA: Diagnosis not present

## 2023-09-15 DIAGNOSIS — G20A1 Parkinson's disease without dyskinesia, without mention of fluctuations: Secondary | ICD-10-CM | POA: Diagnosis not present

## 2023-09-15 DIAGNOSIS — F39 Unspecified mood [affective] disorder: Secondary | ICD-10-CM | POA: Diagnosis not present

## 2023-09-15 DIAGNOSIS — I10 Essential (primary) hypertension: Secondary | ICD-10-CM | POA: Diagnosis not present

## 2023-09-15 DIAGNOSIS — E785 Hyperlipidemia, unspecified: Secondary | ICD-10-CM | POA: Diagnosis not present

## 2023-09-15 DIAGNOSIS — K219 Gastro-esophageal reflux disease without esophagitis: Secondary | ICD-10-CM | POA: Diagnosis not present

## 2023-09-15 DIAGNOSIS — Z9889 Other specified postprocedural states: Secondary | ICD-10-CM | POA: Diagnosis not present

## 2023-09-15 DIAGNOSIS — Z8546 Personal history of malignant neoplasm of prostate: Secondary | ICD-10-CM | POA: Diagnosis not present

## 2023-09-15 DIAGNOSIS — D126 Benign neoplasm of colon, unspecified: Secondary | ICD-10-CM | POA: Diagnosis not present

## 2023-09-15 DIAGNOSIS — Z0000 Encounter for general adult medical examination without abnormal findings: Secondary | ICD-10-CM | POA: Diagnosis not present

## 2023-09-15 DIAGNOSIS — R82998 Other abnormal findings in urine: Secondary | ICD-10-CM | POA: Diagnosis not present

## 2023-09-15 DIAGNOSIS — Z1339 Encounter for screening examination for other mental health and behavioral disorders: Secondary | ICD-10-CM | POA: Diagnosis not present

## 2023-10-23 ENCOUNTER — Other Ambulatory Visit: Payer: Self-pay | Admitting: Neurology

## 2023-10-23 ENCOUNTER — Other Ambulatory Visit: Payer: Self-pay | Admitting: Cardiovascular Disease

## 2023-10-31 NOTE — Progress Notes (Signed)
 Assessment/Plan:   1.  Parkinsons Disease  -Continue carbidopa /levodopa  25/100, 1 tablet 3 times per day  -We discussed that it used to be thought that levodopa  would increase risk of melanoma but now it is believed that Parkinsons itself likely increases risk of melanoma. he is to get regular skin checks.  -refer PT/ST.  He is a bit hypophonic now.  2.  History of hypertension             -He is on propranolol and lisinopril .  Discussed with him that we will need to watch these medications closely.  Parkinsons tends to cause dysautonomia.  His propranolol has been helping his tremor some, but he has been bradycardic in past (not today). Discussed concept of permissive hypertension in Parkinson's.   His blood pressure medicines may need tapered in future.  3.  Alcohol  use  -he has decreased to 2 days/week and told him I am proud of him for that!  4.  Xerostomia  -discussed that this is not associated with Parkinsons Disease. However, he is on bentyl  which certainly can cause dry mouth Subjective:   KYVON HU was seen today in follow up for Parkinsons disease, diagnosed last visit.  My previous records were reviewed prior to todays visit as well as outside records available to me.  Pt with wife who supplements hx.  On levodopa .  It used to be more helpful than it was.  Wife notes that he doesn't seem to have enough saliva to break down food.  One fall and that was off of the treadmill.  He is exercising 3 days/week.  No near syncope.    Current prescribed movement disorder medications: Carbidopa /levodopa  25/100, 1 tablet 3 times per day   ALLERGIES:  No Known Allergies  CURRENT MEDICATIONS:  Current Meds  Medication Sig   amoxicillin  (AMOXIL ) 500 MG capsule TAKE 4 CAPSULES 30-60 MINUTES BEFORE DENTAL PROCEDURES   aspirin  EC 81 MG tablet Take 1 tablet (81 mg total) by mouth daily. Swallow whole.   buPROPion  (WELLBUTRIN  XL) 300 MG 24 hr tablet Take 300 mg by mouth every morning.    carbidopa -levodopa  (SINEMET  IR) 25-100 MG tablet TAKE 1 TABLET BY MOUTH 3 (THREE) TIMES DAILY. 7AM/11AM/4PM   dicyclomine  (BENTYL ) 10 MG capsule Take 1 capsule (10 mg total) by mouth daily as needed for spasms (or abdominal pain/cramping). (Patient taking differently: Take 10 mg by mouth 4 (four) times daily -  before meals and at bedtime.)   escitalopram  (LEXAPRO ) 5 MG tablet Take 5 mg by mouth every evening.    fluticasone  (FLONASE ) 50 MCG/ACT nasal spray Place 1 spray into both nostrils daily as needed for allergies.   hydroxypropyl methylcellulose / hypromellose (ISOPTO TEARS / GONIOVISC) 2.5 % ophthalmic solution Place 1 drop into both eyes 3 (three) times daily as needed (for dryness).    lisinopril  (ZESTRIL ) 2.5 MG tablet TAKE 1 TABLET BY MOUTH EVERY DAY   pantoprazole  (PROTONIX ) 40 MG tablet Take 1 tablet (40 mg total) by mouth daily before breakfast.   propranolol (INDERAL) 60 MG tablet Take 60 mg by mouth daily.   rosuvastatin  (CRESTOR ) 5 MG tablet TAKE 1 TABLET (5 MG TOTAL) BY MOUTH DAILY.   sildenafil (VIAGRA) 100 MG tablet Take 100 mg by mouth daily as needed for erectile dysfunction.   traZODone  (DESYREL ) 50 MG tablet Take 50 mg by mouth at bedtime.     Objective:   PHYSICAL EXAMINATION:    VITALS:   Vitals:   11/06/23  0907  BP: 116/88  Pulse: (!) 59  SpO2: 99%  Weight: 151 lb 12.8 oz (68.9 kg)     GEN:  The patient appears stated age and is in NAD. HEENT:  Normocephalic, atraumatic.  The mucous membranes are moist. The superficial temporal arteries are without ropiness or tenderness. CV:  brady.  regular Lungs:  CTAB Neck/HEME:  There are no carotid bruits bilaterally.  Neurological examination:  Orientation: The patient is alert and oriented x3. Cranial nerves: There is good facial symmetry with minimal facial hypomimia. The speech is fluent and clear. He is very hypophonic.  Soft palate rises symmetrically and there is no tongue deviation. Hearing is intact to  conversational tone. Sensation: Sensation is intact to light touch throughout Motor: Strength is at least antigravity x4.  Movement examination: Tone: There is nl tone in the UE/LE Abnormal movements: there is no significant tremor today Coordination:  There is no decremation with RAM's, with any form of RAMS, including alternating supination and pronation of the forearm, hand opening and closing, finger taps, heel taps and toe taps.  Gait and Station: The patient has no difficulty arising out of a deep-seated chair without the use of the hands. The patient's stride length is good and just slightly short stepped.     I have reviewed and interpreted the following labs independently    Chemistry      Component Value Date/Time   NA 138 05/15/2021 0153   NA 142 04/28/2020 0924   K 4.1 05/15/2021 0153   CL 106 05/15/2021 0153   CO2 26 05/15/2021 0153   BUN 10 05/15/2021 0153   BUN 15 04/28/2020 0924   CREATININE 0.89 05/15/2021 0153      Component Value Date/Time   CALCIUM  8.6 (L) 05/15/2021 0153   ALKPHOS 53 05/10/2021 0955   AST 24 05/10/2021 0955   ALT 16 05/10/2021 0955   BILITOT 0.7 05/10/2021 0955   BILITOT 0.6 12/10/2019 1115       Lab Results  Component Value Date   WBC 11.7 (H) 05/15/2021   HGB 13.4 05/15/2021   HCT 39.9 05/15/2021   MCV 94.5 05/15/2021   PLT PLATELET CLUMPS NOTED ON SMEAR, UNABLE TO ESTIMATE 05/15/2021    No results found for: TSH  Total time spent on today's visit was 30 minutes, including both face-to-face time and nonface-to-face time.  Time included that spent on review of records (prior notes available to me/labs/imaging if pertinent), discussing treatment and goals, answering patient's questions and coordinating care.    Cc:  Avva, Ravisankar, MD

## 2023-11-06 ENCOUNTER — Ambulatory Visit (INDEPENDENT_AMBULATORY_CARE_PROVIDER_SITE_OTHER): Admitting: Neurology

## 2023-11-06 ENCOUNTER — Encounter: Payer: Self-pay | Admitting: Neurology

## 2023-11-06 VITALS — BP 116/88 | HR 59 | Wt 151.8 lb

## 2023-11-06 DIAGNOSIS — K117 Disturbances of salivary secretion: Secondary | ICD-10-CM

## 2023-11-06 DIAGNOSIS — G20A1 Parkinson's disease without dyskinesia, without mention of fluctuations: Secondary | ICD-10-CM | POA: Diagnosis not present

## 2023-11-06 DIAGNOSIS — R498 Other voice and resonance disorders: Secondary | ICD-10-CM | POA: Diagnosis not present

## 2023-11-15 ENCOUNTER — Other Ambulatory Visit: Payer: Self-pay | Admitting: Cardiovascular Disease

## 2023-11-16 ENCOUNTER — Other Ambulatory Visit: Payer: Self-pay | Admitting: Cardiovascular Disease

## 2023-11-27 ENCOUNTER — Other Ambulatory Visit: Payer: Self-pay | Admitting: Cardiovascular Disease

## 2023-11-29 ENCOUNTER — Other Ambulatory Visit: Payer: Self-pay | Admitting: Cardiovascular Disease

## 2023-12-09 ENCOUNTER — Telehealth: Payer: Self-pay

## 2023-12-09 ENCOUNTER — Other Ambulatory Visit: Payer: Self-pay

## 2023-12-09 ENCOUNTER — Ambulatory Visit: Attending: Neurology

## 2023-12-09 ENCOUNTER — Ambulatory Visit

## 2023-12-09 DIAGNOSIS — R1312 Dysphagia, oropharyngeal phase: Secondary | ICD-10-CM | POA: Diagnosis not present

## 2023-12-09 DIAGNOSIS — R498 Other voice and resonance disorders: Secondary | ICD-10-CM

## 2023-12-09 DIAGNOSIS — R471 Dysarthria and anarthria: Secondary | ICD-10-CM | POA: Insufficient documentation

## 2023-12-09 DIAGNOSIS — R2681 Unsteadiness on feet: Secondary | ICD-10-CM | POA: Insufficient documentation

## 2023-12-09 DIAGNOSIS — R29898 Other symptoms and signs involving the musculoskeletal system: Secondary | ICD-10-CM | POA: Insufficient documentation

## 2023-12-09 DIAGNOSIS — M6281 Muscle weakness (generalized): Secondary | ICD-10-CM | POA: Insufficient documentation

## 2023-12-09 DIAGNOSIS — R278 Other lack of coordination: Secondary | ICD-10-CM | POA: Diagnosis not present

## 2023-12-09 DIAGNOSIS — R2689 Other abnormalities of gait and mobility: Secondary | ICD-10-CM | POA: Insufficient documentation

## 2023-12-09 DIAGNOSIS — K117 Disturbances of salivary secretion: Secondary | ICD-10-CM

## 2023-12-09 DIAGNOSIS — G20A1 Parkinson's disease without dyskinesia, without mention of fluctuations: Secondary | ICD-10-CM | POA: Diagnosis not present

## 2023-12-09 NOTE — Telephone Encounter (Signed)
-----   Message from Darice CHRISTELLA Shivers sent at 12/09/2023 11:12 AM EDT ----- Pls order OT and modified barium swallow at [facility] as requested below, thanks ----- Message ----- From: Jacelyn Lupita NOVAK, CCC-SLP Sent: 12/09/2023  10:44 AM EDT To: Asberry GORMAN Schneider, DO; Mitzie JAYSON Furnace, CMA  Hi Dr. Schneider and Mitzie- I'm here with Mr. Handler and I think, from what he's telling me, he'd benefit from an OT eval here, and a modified barium swallow evaluation at [facility]. Would it be possible to order those?  Please contact me with any questions!  Lupita Jacelyn, SLP   [facility] at [facility] [address] [place], [place] [address]  P: [phone] F: [phone]

## 2023-12-09 NOTE — Therapy (Signed)
 OUTPATIENT PHYSICAL THERAPY NEURO EVALUATION   Patient Name: Tyler [name] MRN: [id number] DOB:08-19-1950, 73 y.o., male Today's Date: 12/09/2023   PCP: Janey Santos, MD REFERRING PROVIDER: Evonnie Asberry RAMAN, DO  END OF SESSION:  PT End of Session - 12/09/23 1104     Visit Number 1    Number of Visits 14    Date for Recertification  02/10/24    Authorization Type [employer]    PT Start Time 1100    PT Stop Time 1145    PT Time Calculation (min) 45 min          Past Medical History:  Diagnosis Date   Adenomatous polyp of colon 02/2004   Anemia    Anxiety    Arthritis    BPH (benign prostatic hyperplasia)    Colonic varices    Degenerative joint disease    Depression    Depression    Fatty liver    GERD (gastroesophageal reflux disease)    Heart murmur    Hemorrhoids    Hyperlipidemia    Hypertension    IBS (irritable bowel syndrome)    Prostate cancer (HCC) 2011   S/P minimally invasive mitral valve repair 04/11/2020   Complex valvuloplasty including artificial Gore-tex neochord placement x8 with suture plication of posterior commissure and 30 mm Sorin Memo 3D ring annuloplasty via right mini thoracotomy approach   Thrombocytopenia    Thyroid  nodule    Past Surgical History:  Procedure Laterality Date   BUBBLE STUDY  12/22/2019   Procedure: BUBBLE STUDY;  Surgeon: Barbaraann Darryle Ned, MD;  Location: [facility] ENDOSCOPY;  Service: Cardiovascular;;   CARDIAC CATHETERIZATION     COLONOSCOPY     MASS EXCISION Right 05/14/2021   Procedure: REPAIR RIGHT CHEST WALL HERNIA WITH MESH;  Surgeon: Kerrin Elspeth BROCKS, MD;  Location: [facility] OR;  Service: Thoracic;  Laterality: Right;   MITRAL VALVE REPAIR Right 04/11/2020   Procedure: MINIMALLY INVASIVE MITRAL VALVE REPAIR (MVR) USING MEMO 4D RING SIZE ;  Surgeon: Dusty Sudie DEL, MD;  Location: [facility] OR;  Service: Open Heart Surgery;  Laterality: Right;   PROSTATECTOMY     RIGHT/LEFT HEART CATH AND CORONARY  ANGIOGRAPHY N/A 12/22/2019   Procedure: RIGHT/LEFT HEART CATH AND CORONARY ANGIOGRAPHY;  Surgeon: Wonda Sharper, MD;  Location: [facility] INVASIVE CV LAB;  Service: Cardiovascular;  Laterality: N/A;   TEE WITHOUT CARDIOVERSION N/A 12/22/2019   Procedure: TRANSESOPHAGEAL ECHOCARDIOGRAM (TEE);  Surgeon: Barbaraann Darryle Ned, MD;  Location: [facility] ENDOSCOPY;  Service: Cardiovascular;  Laterality: N/A;   TEE WITHOUT CARDIOVERSION N/A 04/11/2020   Procedure: TRANSESOPHAGEAL ECHOCARDIOGRAM (TEE);  Surgeon: Dusty Sudie DEL, MD;  Location: [facility] OR;  Service: Open Heart Surgery;  Laterality: N/A;   TONSILLECTOMY     TOOTH EXTRACTION  03/07/2020   TYMPANOPLASTY  01/2016   Patient Active Problem List   Diagnosis Date Noted   Incisional hernia 05/14/2021   S/P minimally invasive mitral valve repair 04/11/2020   Mitral regurgitation 12/20/2019   VARICES OF OTHER SITES 12/30/2007   History of colonic polyps 12/25/2007    ONSET DATE: August 2024  REFERRING DIAG:  G20.A1 (ICD-10-CM) - Parkinson's disease without dyskinesia or fluctuating manifestations (HCC)    THERAPY DIAG:  Unsteadiness on feet  Other abnormalities of gait and mobility  Rationale for Evaluation and Treatment: Rehabilitation  SUBJECTIVE:  SUBJECTIVE STATEMENT: Dx w/ PD in August 2024 and has been experiencing tremors in right hand/leg and not feeling as strong. Currently  reports active most days of the week using treadmill and light weights at home. Denies any freezing of gait but notes some sensation of backwards LOB and occasional shuffling of RLE. Recently experienced backwards fall on treadmill when cooling down. Occasional lightheadedness with position change.  Pt accompanied by: self  PERTINENT HISTORY: hx of mitral valve replacement   PAIN:   Are you having pain? No  PRECAUTIONS: None  RED FLAGS: None   WEIGHT BEARING RESTRICTIONS:   FALLS: Has patient fallen in last 6 months? one fall a couple weeks ago on his treadmill  LIVING ENVIRONMENT: Lives with: lives with their spouse Lives in: House/apartment Stairs: stairs inside/outside, does have chair lift available if needed Has following equipment at home: None  PLOF: Independent  PATIENT GOALS:   OBJECTIVE:  Note: Objective measures were completed at Evaluation unless otherwise noted.  DIAGNOSTIC FINDINGS:   COGNITION: Overall cognitive status: Within functional limits for tasks assessed   SENSATION: WFL  COORDINATION: intact  EDEMA:  none  MUSCLE TONE: slight, 1+ bilateral biceps  MUSCLE LENGTH: WNL  DTRs:  NT  POSTURE: No Significant postural limitations  LOWER EXTREMITY ROM:     WNL  LOWER EXTREMITY MMT:    5/5  BED MOBILITY:  indep  TRANSFERS: indep    CURB:  Findings: indep  STAIRS: Not tested--reports independent but relies on HR for security GAIT: Findings: Comments: no deviations, decreased speed  FUNCTIONAL TESTS:  5 times sit to stand: 11 sec 10 meter walk test: 12.13 sec = 2.7 ft/sec Mini-BESTest: 22/28 : 5.3 sec                                                                                                                                TREATMENT DATE: 12/09/23    PATIENT EDUCATION: Education details: discussed relevant exercise intervention strategies/methods for tx of PD, initiated discussion of group classes in community, rationale of interventions Person educated: Patient and Spouse Education method: Explanation Education comprehension: needs further education  HOME EXERCISE PROGRAM: TBD  GOALS: Goals reviewed with patient? Yes  SHORT TERM GOALS: Target date: 01/06/2024    Patient will be independent in HEP to improve functional outcomes Baseline: Goal status: INITIAL  2.  Demo  reduced risk for falls per time 4.8 sec 3-Meter Backwards Walk Test Baseline: 5.3 sec Goal status: INITIAL  3.  Demo ability to sustain speed of 3.25 ft/sec during ambulation to improve community mobility Baseline: 2.7 ft/sec Goal status: INITIAL   LONG TERM GOALS: Target date: 02/10/2024    Pt to verbalize understanding or participation in community-based classes for people with Parkinson's to improve activity carryover Baseline:  Goal status: INITIAL  2.  Demo improved balance and reduced fall risk per score 25/28 Mini-BESTest Baseline: 22/28 Goal status: INITIAL  3.  Patient to  teach-back metrics for defining and executing high intensity training Baseline:  Goal status: INITIAL   ASSESSMENT:  CLINICAL IMPRESSION: Patient is a 73 y.o. male who was seen today for physical therapy evaluation and treatment for Parkinson's Disease.  Reports increasing difficulty with mobility such as walking on uneven ground and decreased confidence with descending stairs as well as one instance of falling when using treadmill.  Demonstrates increased risk for falls per time > 4.8 sec with and scores at threshold for Mini-BESTest with notable difficulty with push-release portion of test experiencing complete LOB with right lateral direction and requiring multiple steps to recover with anterior/posterior LOB. Ambulating with decreased speed level surfaces. Pt would benefit from PT services to address deficits and limitations as well as further education and resources for mgmt of progressive, neurological disease   OBJECTIVE IMPAIRMENTS: decreased activity tolerance, decreased balance, decreased endurance, decreased knowledge of condition, difficulty walking, and impaired tone.   ACTIVITY LIMITATIONS: carrying, lifting, stairs, and locomotion level  PARTICIPATION LIMITATIONS: community activity, yard work, and exercise routine  PERSONAL FACTORS: Age, Time since onset of  injury/illness/exacerbation, and 1 comorbidity: PMH are also affecting patient's functional outcome.   REHAB POTENTIAL: Excellent  CLINICAL DECISION MAKING: Evolving/moderate complexity  EVALUATION COMPLEXITY: Moderate  PLAN:  PT FREQUENCY: 2x/wk x 4 wks, then 1x/wk x 4 wks  PT DURATION: 8 weeks  PLANNED INTERVENTIONS: 97750- Physical Performance Testing, 97110-Therapeutic exercises, 97530- Therapeutic activity, [id number]- Neuromuscular re-education, 97535- Self Care, [id number]- Manual therapy, [id number]- Gait training, [phone]- Canalith repositioning, [id number]- Aquatic Therapy, and Patient/Family education  PLAN FOR NEXT SESSION: initiate HEP for balance and high intensity interval training and relevant metrics for self-monitoring   2:34 PM, 12/09/23 M. Kelly Keelon Zurn, PT, DPT Physical Therapist- [place] Office Number: [phone]

## 2023-12-09 NOTE — Telephone Encounter (Signed)
 Ordered SLP and OT

## 2023-12-09 NOTE — Therapy (Signed)
 OUTPATIENT SPEECH LANGUAGE PATHOLOGY PARKINSON'S EVALUATION   Patient Name: Tyler [name] MRN: [id number] DOB:02-Jun-1950, 73 y.o., male Today's Date: 12/09/2023  PCP: Janey Santos, MD REFERRING PROVIDER: Evonnie Stabs, DO  END OF SESSION:  End of Session - 12/09/23 1610     Visit Number 1    Number of Visits 17    Date for Recertification  02/06/24    SLP Start Time 1020    SLP Stop Time  1101    SLP Time Calculation (min) 41 min    Activity Tolerance Patient tolerated treatment well          Past Medical History:  Diagnosis Date   Adenomatous polyp of colon 02/2004   Anemia    Anxiety    Arthritis    BPH (benign prostatic hyperplasia)    Colonic varices    Degenerative joint disease    Depression    Depression    Fatty liver    GERD (gastroesophageal reflux disease)    Heart murmur    Hemorrhoids    Hyperlipidemia    Hypertension    IBS (irritable bowel syndrome)    Prostate cancer (HCC) 2011   S/P minimally invasive mitral valve repair 04/11/2020   Complex valvuloplasty including artificial Gore-tex neochord placement x8 with suture plication of posterior commissure and 30 mm Sorin Memo 3D ring annuloplasty via right mini thoracotomy approach   Thrombocytopenia    Thyroid  nodule    Past Surgical History:  Procedure Laterality Date   BUBBLE STUDY  12/22/2019   Procedure: BUBBLE STUDY;  Surgeon: Barbaraann Darryle Ned, MD;  Location: [facility] ENDOSCOPY;  Service: Cardiovascular;;   CARDIAC CATHETERIZATION     COLONOSCOPY     MASS EXCISION Right 05/14/2021   Procedure: REPAIR RIGHT CHEST WALL HERNIA WITH MESH;  Surgeon: Kerrin Elspeth BROCKS, MD;  Location: [facility] OR;  Service: Thoracic;  Laterality: Right;   MITRAL VALVE REPAIR Right 04/11/2020   Procedure: MINIMALLY INVASIVE MITRAL VALVE REPAIR (MVR) USING MEMO 4D RING SIZE ;  Surgeon: Dusty Sudie DEL, MD;  Location: [facility] OR;  Service: Open Heart Surgery;  Laterality: Right;   PROSTATECTOMY     RIGHT/LEFT HEART  CATH AND CORONARY ANGIOGRAPHY N/A 12/22/2019   Procedure: RIGHT/LEFT HEART CATH AND CORONARY ANGIOGRAPHY;  Surgeon: Wonda Sharper, MD;  Location: [facility] INVASIVE CV LAB;  Service: Cardiovascular;  Laterality: N/A;   TEE WITHOUT CARDIOVERSION N/A 12/22/2019   Procedure: TRANSESOPHAGEAL ECHOCARDIOGRAM (TEE);  Surgeon: Barbaraann Darryle Ned, MD;  Location: [facility] ENDOSCOPY;  Service: Cardiovascular;  Laterality: N/A;   TEE WITHOUT CARDIOVERSION N/A 04/11/2020   Procedure: TRANSESOPHAGEAL ECHOCARDIOGRAM (TEE);  Surgeon: Dusty Sudie DEL, MD;  Location: [facility] OR;  Service: Open Heart Surgery;  Laterality: N/A;   TONSILLECTOMY     TOOTH EXTRACTION  03/07/2020   TYMPANOPLASTY  01/2016   Patient Active Problem List   Diagnosis Date Noted   Incisional hernia 05/14/2021   S/P minimally invasive mitral valve repair 04/11/2020   Mitral regurgitation 12/20/2019   VARICES OF OTHER SITES 12/30/2007   History of colonic polyps 12/25/2007    ONSET DATE: diagnosed one year ago; script 11/06/23  REFERRING DIAG: Hypophonia  THERAPY DIAG:  Dysarthria and anarthria  Dysphagia, oropharyngeal phase  Rationale for Evaluation and Treatment: Rehabilitation  SUBJECTIVE:   SUBJECTIVE STATEMENT: He coughs pretty much every meal.  Pt accompanied by: significant other; wife-[name]  PERTINENT HISTORY: See above. Pt dx'd with PD August 2024 with sx prior to that.  PAIN:  Are you having pain?  No  FALLS: Has patient fallen in last 6 months?  See PT evaluation for details  LIVING ENVIRONMENT: Lives with: lives with their spouse Lives in: House/apartment  PLOF:  Level of assistance: Independent with ADLs, Independent with IADLs Employment: Retired  PATIENT GOALS: Improve speech and swallowing  OBJECTIVE:  Note: Objective measures were completed at Evaluation unless otherwise noted.   COGNITION: Overall cognitive status: Within functional limits for tasks assessed  MOTOR SPEECH: Overall motor speech:  impaired Level of impairment: Word, Phrase, Sentence, and Conversation Respiration: thoracic breathing Phonation: low vocal intensity Resonance: WFL Articulation: Appears intact Intelligibility: Intelligible Motor planning: Appears intact Effective technique: increased vocal intensity  ORAL MOTOR EXAMINATION: Overall status: Impaired: Labial: Bilateral (Coordination) Lingual: Bilateral (Coordination) Facial: Bilateral (ROM) Comments: Slight facial hypomimia. Better with visual cues  OBJECTIVE VOICE ASSESSMENT: Sustained ah maximum phonation time: 12.12 seconds Sustained ah loudness average: 67 dB Sustained ah loudness average (with intent/effort): dB Conversational loudness average: 68 dB Conversational loudness range: 53-76 dB Voice quality: breathy and low vocal intensity Stimulability trials: Given SLP modeling and rare min cues, loudness average increased to 71dB (range of 56 to 77dB) at short conversationlevel.   Completed audio recording of patients baseline voice without cueing from SLP: No  Pt does report difficulty with swallowing which does warrant further evaluation. Suggestion was made to Dr. Evonnie for order for modified barium swallow exam.   PATIENT REPORTED OUTCOME MEASURES (PROM): Communication Participation Item Bank: provided in first 2 sessions                                                                                                                            TREATMENT DATE:   12/09/23: n/a  PATIENT EDUCATION: Education details: Results of eval, basics of MBS Person educated: Patient and Spouse Education method: Explanation Education comprehension: verbalized understanding and needs further education  HOME EXERCISE PROGRAM: Eventually dysarthria program for louder speech, possible dysphagia program   GOALS: Goals reviewed with patient? Yes, generally  SHORT TERM GOALS: Target date: 01/09/24  Pt will undergo MBS if clinically  indicated Baseline: Goal status: INITIAL  2.  Pt will demo 18/20 sentences with WNL volume Baseline:  Goal status: INITIAL  3.  Pt will demo WNL volume in 5 minutes conversation Baseline:  Goal status: INITIAL  4.  Pt will complete dysphagia HEP (if indicated following MBS) with rare min A Baseline:  Goal status: INITIAL   LONG TERM GOALS: Target date: 02/06/24  Pt will have improved PROM Baseline:  Goal status: INITIAL  2.  Pt will demo WNL volume in 10 minutes conversation Baseline:  Goal status: INITIAL  3.  Pt will complete dysphagia HEP (if indicated following MBS) with mod I in 3 sessions Baseline:  Goal status: INITIAL  4.  Pt will demo knowledge about where to find reputable info about speech/language info with Parkinson's on the internet Baseline:  Goal status: INITIAL   ASSESSMENT:  CLINICAL IMPRESSION: Patient is  a 73 y.o. M who was seen today for assessment of speech and language in light of PD. He demonstrated incr'd volume and intent with short conversation today proving to be a good candidate for skilled ST. Wife states pt's volume is such that she has to ask him to repeat more often than she had to 5-6 years ago. Pt's wife reported pt has coughing with nearly every meal. Pt;s referring MD was asked for MBS orders and OT orders due to pt c/o handwriting and buttoning buttons.    OBJECTIVE IMPAIRMENTS: Objective impairments include dysarthria and dysphagia. These impairments are limiting patient from household responsibilities, ADLs/IADLs, effectively communicating at home and in community, and safety when swallowing.Factors affecting potential to achieve goals and functional outcome are none noted today. Patient will benefit from skilled SLP services to address above impairments and improve overall function.  REHAB POTENTIAL: Good  PLAN:  SLP FREQUENCY: 2x/week  SLP DURATION: 8 weeks  PLANNED INTERVENTIONS: Aspiration precaution training, Pharyngeal  strengthening exercises, Diet toleration management , Cueing hierachy, Internal/external aids, Oral motor exercises, Functional tasks, SLP instruction and feedback, Compensatory strategies, Patient/family education, [phone] Treatment of speech (30 or 45 min) , and [id number] Treatment of swallowing function    Reginaldo Hazard, CCC-SLP 12/09/2023, 4:11 PM

## 2023-12-10 ENCOUNTER — Telehealth: Payer: Self-pay

## 2023-12-10 ENCOUNTER — Other Ambulatory Visit: Payer: Self-pay | Admitting: Cardiovascular Disease

## 2023-12-10 NOTE — Telephone Encounter (Signed)
 Modified Barium Swallow needs no PA. Ref# [id number]

## 2023-12-11 ENCOUNTER — Telehealth (HOSPITAL_COMMUNITY): Payer: Self-pay | Admitting: *Deleted

## 2023-12-11 ENCOUNTER — Other Ambulatory Visit (HOSPITAL_COMMUNITY): Payer: Self-pay | Admitting: *Deleted

## 2023-12-11 DIAGNOSIS — R1310 Dysphagia, unspecified: Secondary | ICD-10-CM

## 2023-12-11 NOTE — Telephone Encounter (Signed)
 Attempted to contact patient to schedule OP MBS. Left VM @ [phone] requesting a call back. RKEEL

## 2023-12-14 ENCOUNTER — Other Ambulatory Visit: Payer: Self-pay | Admitting: Cardiovascular Disease

## 2023-12-17 ENCOUNTER — Encounter: Payer: Self-pay | Admitting: Physical Therapy

## 2023-12-17 ENCOUNTER — Ambulatory Visit: Admitting: Physical Therapy

## 2023-12-17 DIAGNOSIS — R2681 Unsteadiness on feet: Secondary | ICD-10-CM

## 2023-12-17 DIAGNOSIS — R2689 Other abnormalities of gait and mobility: Secondary | ICD-10-CM

## 2023-12-17 NOTE — Telephone Encounter (Signed)
 SABRA

## 2023-12-17 NOTE — Therapy (Signed)
 OUTPATIENT PHYSICAL THERAPY NEURO TREATMENT NOTE   Patient Name: Tyler [name] MRN: [id number] DOB:1950/05/17, 73 y.o., male Today's Date: 12/17/2023   PCP: Janey Santos, MD REFERRING PROVIDER: Evonnie Asberry RAMAN, DO  END OF SESSION:  PT End of Session - 12/17/23 1322     Visit Number 2    Number of Visits 14    Date for Recertification  02/10/24    Authorization Type [employer]    PT Start Time 1321    PT Stop Time 1401    PT Time Calculation (min) 40 min    Activity Tolerance Patient tolerated treatment well    Behavior During Therapy [facility] for tasks assessed/performed           Past Medical History:  Diagnosis Date   Adenomatous polyp of colon 02/2004   Anemia    Anxiety    Arthritis    BPH (benign prostatic hyperplasia)    Colonic varices    Degenerative joint disease    Depression    Depression    Fatty liver    GERD (gastroesophageal reflux disease)    Heart murmur    Hemorrhoids    Hyperlipidemia    Hypertension    IBS (irritable bowel syndrome)    Prostate cancer (HCC) 2011   S/P minimally invasive mitral valve repair 04/11/2020   Complex valvuloplasty including artificial Gore-tex neochord placement x8 with suture plication of posterior commissure and 30 mm Sorin Memo 3D ring annuloplasty via right mini thoracotomy approach   Thrombocytopenia    Thyroid  nodule    Past Surgical History:  Procedure Laterality Date   BUBBLE STUDY  12/22/2019   Procedure: BUBBLE STUDY;  Surgeon: Barbaraann Darryle Ned, MD;  Location: [facility] ENDOSCOPY;  Service: Cardiovascular;;   CARDIAC CATHETERIZATION     COLONOSCOPY     MASS EXCISION Right 05/14/2021   Procedure: REPAIR RIGHT CHEST WALL HERNIA WITH MESH;  Surgeon: Kerrin Elspeth BROCKS, MD;  Location: [facility] OR;  Service: Thoracic;  Laterality: Right;   MITRAL VALVE REPAIR Right 04/11/2020   Procedure: MINIMALLY INVASIVE MITRAL VALVE REPAIR (MVR) USING MEMO 4D RING SIZE ;  Surgeon: Dusty Sudie DEL, MD;   Location: [facility] OR;  Service: Open Heart Surgery;  Laterality: Right;   PROSTATECTOMY     RIGHT/LEFT HEART CATH AND CORONARY ANGIOGRAPHY N/A 12/22/2019   Procedure: RIGHT/LEFT HEART CATH AND CORONARY ANGIOGRAPHY;  Surgeon: Wonda Sharper, MD;  Location: [facility] INVASIVE CV LAB;  Service: Cardiovascular;  Laterality: N/A;   TEE WITHOUT CARDIOVERSION N/A 12/22/2019   Procedure: TRANSESOPHAGEAL ECHOCARDIOGRAM (TEE);  Surgeon: Barbaraann Darryle Ned, MD;  Location: [facility] ENDOSCOPY;  Service: Cardiovascular;  Laterality: N/A;   TEE WITHOUT CARDIOVERSION N/A 04/11/2020   Procedure: TRANSESOPHAGEAL ECHOCARDIOGRAM (TEE);  Surgeon: Dusty Sudie DEL, MD;  Location: [facility] OR;  Service: Open Heart Surgery;  Laterality: N/A;   TONSILLECTOMY     TOOTH EXTRACTION  03/07/2020   TYMPANOPLASTY  01/2016   Patient Active Problem List   Diagnosis Date Noted   Incisional hernia 05/14/2021   S/P minimally invasive mitral valve repair 04/11/2020   Mitral regurgitation 12/20/2019   VARICES OF OTHER SITES 12/30/2007   History of colonic polyps 12/25/2007    ONSET DATE: August 2024  REFERRING DIAG:  G20.A1 (ICD-10-CM) - Parkinson's disease without dyskinesia or fluctuating manifestations (HCC)    THERAPY DIAG:  Unsteadiness on feet  Other abnormalities of gait and mobility  Rationale for Evaluation and Treatment: Rehabilitation  SUBJECTIVE:  SUBJECTIVE STATEMENT: I feel wobbly most of the time.  Sometimes hold onto walls and furniture.  Pt accompanied by: self  PERTINENT HISTORY: hx of mitral valve replacement, tendon problem in R shoulder (hx of this, tendon detached from shoulder), hx of L RTC and R RTC issues  PAIN:  Are you having pain? No  PRECAUTIONS: None  RED FLAGS: None   WEIGHT BEARING RESTRICTIONS:   FALLS: Has  patient fallen in last 6 months? one fall a couple weeks ago on his treadmill  LIVING ENVIRONMENT: Lives with: lives with their spouse Lives in: House/apartment Stairs: stairs inside/outside, does have chair lift available if needed Has following equipment at home: None  PLOF: Independent  Exercise routine at home:  treadmill at home x 30 minutes, light weights, and some balance exercises 3-4 days per week.  PATIENT GOALS: balance  OBJECTIVE:    TODAY'S TREATMENT: 12/17/2023 Activity Comments  Standing PWR! Moves: -PWR! Up x 10 -PWR! Rock x 10 -PWR! Twist x 10 -PWR! Step x 10 Visual and verbal cues  Forward/back step and weightshift   Back step and weightshift Cues for hinge at hips technique  Forward/back walking 20 ft x 4 reps supervision  Gait activities 50 ft x 10 reps Cues for increased stride and arm swing; rates effort 3/10     Access Code: [email or web address] URL: [email or web address] Date: 12/17/2023 Prepared by: [facility] - Outpatient  Rehab - [facility]  Exercises - Alternating Step Backward with Support  - 1 x daily - 7 x weekly - 1-2 sets - 10 reps  PATIENT EDUCATION: Education details: HEP additions; discussion about intensity/amplitude of movement patterns, even walking in home with long strides, attention to posture, arm swing Person educated: Patient Education method: Explanation, Demonstration, and Handouts Education comprehension: verbalized understanding, returned demonstration, and needs further education  ---------------------------------- Note: Objective measures were completed at Evaluation unless otherwise noted.  DIAGNOSTIC FINDINGS:   COGNITION: Overall cognitive status: Within functional limits for tasks assessed   SENSATION: WFL  COORDINATION: intact  EDEMA:  none  MUSCLE TONE: slight, 1+ bilateral biceps  MUSCLE LENGTH: WNL  DTRs:  NT  POSTURE: No Significant postural limitations  LOWER EXTREMITY ROM:      WNL  LOWER EXTREMITY MMT:    5/5  BED MOBILITY:  indep  TRANSFERS: indep    CURB:  Findings: indep  STAIRS: Not tested--reports independent but relies on HR for security GAIT: Findings: Comments: no deviations, decreased speed  FUNCTIONAL TESTS:  5 times sit to stand: 11 sec 10 meter walk test: 12.13 sec = 2.7 ft/sec Mini-BESTest: 22/28 : 5.3 sec                                                                                                                                TREATMENT DATE: 12/09/23    PATIENT EDUCATION: Education details: discussed relevant exercise intervention strategies/methods for tx of PD, initiated discussion of group classes in  community, rationale of interventions Person educated: Patient and Spouse Education method: Explanation Education comprehension: needs further education  HOME EXERCISE PROGRAM: TBD  GOALS: Goals reviewed with patient? Yes  SHORT TERM GOALS: Target date: 01/06/2024    Patient will be independent in HEP to improve functional outcomes Baseline: Goal status: INITIAL  2.  Demo reduced risk for falls per time 4.8 sec 3-Meter Backwards Walk Test Baseline: 5.3 sec Goal status: INITIAL  3.  Demo ability to sustain speed of 3.25 ft/sec during ambulation to improve community mobility Baseline: 2.7 ft/sec Goal status: INITIAL   LONG TERM GOALS: Target date: 02/10/2024    Pt to verbalize understanding or participation in community-based classes for people with Parkinson's to improve activity carryover Baseline:  Goal status: INITIAL  2.  Demo improved balance and reduced fall risk per score 25/28 Mini-BESTest Baseline: 22/28 Goal status: INITIAL  3.  Patient to teach-back metrics for defining and executing high intensity training Baseline:  Goal status: INITIAL   ASSESSMENT:  CLINICAL IMPRESSION: Pt presents today with no new complaints from eval. Skilled PT session focused on standing balance and  postural re-education with PWR! Moves standing, work on backwards balance recovery, and increased intensity of gait activities.  Pt responds well to visual, verbal cues for optimal performance of PWR! Moves, but does note slight soreness at shoulders (due to history of RTC issues and tendon detachment R shoulder). With work on posterior balance recovery, he does need cues to hinge at hips for improved balance in posterior direction.  He will continue to benefit from skilled PT towards goals for improved functional mobility and decreased fall risk.   OBJECTIVE IMPAIRMENTS: decreased activity tolerance, decreased balance, decreased endurance, decreased knowledge of condition, difficulty walking, and impaired tone.   ACTIVITY LIMITATIONS: carrying, lifting, stairs, and locomotion level  PARTICIPATION LIMITATIONS: community activity, yard work, and exercise routine  PERSONAL FACTORS: Age, Time since onset of injury/illness/exacerbation, and 1 comorbidity: PMH are also affecting patient's functional outcome.   REHAB POTENTIAL: Excellent  CLINICAL DECISION MAKING: Evolving/moderate complexity  EVALUATION COMPLEXITY: Moderate  PLAN:  PT FREQUENCY: 2x/wk x 4 wks, then 1x/wk x 4 wks  PT DURATION: 8 weeks  PLANNED INTERVENTIONS: 97750- Physical Performance Testing, 97110-Therapeutic exercises, 97530- Therapeutic activity, [id number]- Neuromuscular re-education, 97535- Self Care, [id number]- Manual therapy, [id number]- Gait training, [phone]- Canalith repositioning, [id number]- Aquatic Therapy, and Patient/Family education  PLAN FOR NEXT SESSION: Review initial HEP for balance; try again PWR! Moves with more focus on lower body and less intense focus on upper body (due to pt's hx of shoulder issues).  Work on step strategy varied directions, high intensity interval training, gait activities and relevant metrics for self-monitoring   Greig Anon, PT 12/17/23 5:05 PM Phone: [phone] Fax: [phone]  [facility]  Health Outpatient Rehab at [facility] [address] [place], [place] [address] Phone # [phone] Fax # [phone]

## 2023-12-22 ENCOUNTER — Ambulatory Visit (HOSPITAL_COMMUNITY)
Admission: RE | Admit: 2023-12-22 | Discharge: 2023-12-22 | Disposition: A | Discharge status: Home / Self Care | Source: Ambulatory Visit | Attending: Internal Medicine | Admitting: Internal Medicine

## 2023-12-22 ENCOUNTER — Ambulatory Visit

## 2023-12-22 DIAGNOSIS — R1312 Dysphagia, oropharyngeal phase: Secondary | ICD-10-CM | POA: Insufficient documentation

## 2023-12-22 DIAGNOSIS — R2681 Unsteadiness on feet: Secondary | ICD-10-CM | POA: Diagnosis not present

## 2023-12-22 DIAGNOSIS — R498 Other voice and resonance disorders: Secondary | ICD-10-CM

## 2023-12-22 DIAGNOSIS — R1310 Dysphagia, unspecified: Secondary | ICD-10-CM | POA: Diagnosis not present

## 2023-12-22 DIAGNOSIS — K117 Disturbances of salivary secretion: Secondary | ICD-10-CM

## 2023-12-22 DIAGNOSIS — G20A1 Parkinson's disease without dyskinesia, without mention of fluctuations: Secondary | ICD-10-CM

## 2023-12-22 DIAGNOSIS — R471 Dysarthria and anarthria: Secondary | ICD-10-CM

## 2023-12-22 NOTE — Patient Instructions (Signed)
    Twice a day:  10 loud ah (to objectively re-quantify the loudness of your voice)  ====================== Read out loud for 30 seconds, 10 times, focus on more effort and intent- picking up your voice and giving it to the person you are speaking to

## 2023-12-22 NOTE — Progress Notes (Signed)
 Modified Barium Swallow Study  Patient Details  Name: Tyler [name] MRN: [id number] Date of Birth: 1950/11/15  Today's Date: 12/22/2023  Modified Barium Swallow completed.  Full report located under Chart Review in the Imaging Section.  History of Present Illness Pt is a 73 year old male arriving for an OP MBS. Pt being treated by OP SLP due to dx of Parkinsons; wife reported frequent coughing with meals. Pt denies any respiratory concerns. Describes problem as discomfort swallowing.   Clinical Impression Pt demonstrates a mild oropharyngeal dysphagia consisting of good airway protection, but poor oral efficiency and mild pharyngeal residue. Pt recommended to continue regular diet and thin liquids with f/u with OP SLP for appropriate strategies.   Pt has inefficient oral phase with unnecessary lingual movement to gather bolus and initiate swallow as well a piecemeal transit and loss of bolus to floor of mouth and buccal cavity. When cued to do so pt can increase efficency and improve bolus cohesion to decrease oral residue and reduce number of swallows gather it and swallow it all at once. Pt also has mild pharyngeal residue with thin liquids, likely due to oral deficits and slightly decreased ROM and duration of swallow. Again with cues for an effortful swallow pt increases these measures and reduces residue. Pt did not aspirate during the study, but expect some penetration or aspiration of oral residue and pharyngeal residue pooling after bites and sips. Also noted bumpy posterior pharyngeal wall and esophageal column against cervical spine, suggesting loss of tissue mass with age/sarcopenia. Pt to f/u with OP SLP to implement strategies. Would not suggest any texture changes.  DIGEST Swallow Severity Rating*  Safety: 0  Efficiency:1  Overall Pharyngeal Swallow Severity: 1 1: mild; 2: moderate; 3: severe; 4: profound  *The Dynamic Imaging Grade of Swallowing Toxicity is standardized for  the head and neck cancer population, however, demonstrates promising clinical applications across populations to standardize the clinical rating of pharyngeal swallow safety and severity.  Factors that may increase risk of adverse event in presence of aspiration Noe & Lianne 2021):    Swallow Evaluation Recommendations Recommendations: PO diet PO Diet Recommendation: Regular;Thin liquids (Level 0) Liquid Administration via: Cup;Straw Medication Administration: Whole meds with liquid Supervision: Patient able to self-feed Swallowing strategies  : effortful swallow;Avoid mixed consistencies Postural changes: Position pt fully upright for meals;Stay upright 30-60 min after meals Oral care recommendations: Oral care BID (2x/day)    Consuelo Fort, MA CCC-SLP  Acute Rehabilitation Services Secure Chat Preferred Office [phone]   Kelcee Bjorn, Consuelo Fitch 12/22/2023,1:54 PM

## 2023-12-22 NOTE — Therapy (Signed)
 OUTPATIENT SPEECH LANGUAGE PATHOLOGY PARKINSON'S TREATMENT   Patient Name: Tyler [name] MRN: [id number] DOB:08/15/1950, 73 y.o., male Today's Date: 12/22/2023  PCP: Janey Santos, MD REFERRING PROVIDER: Evonnie Stabs, DO  END OF SESSION:  End of Session - 12/22/23 0838     Visit Number 2    Number of Visits 17    Date for Recertification  02/06/24    SLP Start Time 0803    SLP Stop Time  0845    SLP Time Calculation (min) 42 min    Activity Tolerance Patient tolerated treatment well           Past Medical History:  Diagnosis Date   Adenomatous polyp of colon 02/2004   Anemia    Anxiety    Arthritis    BPH (benign prostatic hyperplasia)    Colonic varices    Degenerative joint disease    Depression    Depression    Fatty liver    GERD (gastroesophageal reflux disease)    Heart murmur    Hemorrhoids    Hyperlipidemia    Hypertension    IBS (irritable bowel syndrome)    Prostate cancer (HCC) 2011   S/P minimally invasive mitral valve repair 04/11/2020   Complex valvuloplasty including artificial Gore-tex neochord placement x8 with suture plication of posterior commissure and 30 mm Sorin Memo 3D ring annuloplasty via right mini thoracotomy approach   Thrombocytopenia    Thyroid  nodule    Past Surgical History:  Procedure Laterality Date   BUBBLE STUDY  12/22/2019   Procedure: BUBBLE STUDY;  Surgeon: Barbaraann Darryle Ned, MD;  Location: [facility] ENDOSCOPY;  Service: Cardiovascular;;   CARDIAC CATHETERIZATION     COLONOSCOPY     MASS EXCISION Right 05/14/2021   Procedure: REPAIR RIGHT CHEST WALL HERNIA WITH MESH;  Surgeon: Kerrin Elspeth BROCKS, MD;  Location: [facility] OR;  Service: Thoracic;  Laterality: Right;   MITRAL VALVE REPAIR Right 04/11/2020   Procedure: MINIMALLY INVASIVE MITRAL VALVE REPAIR (MVR) USING MEMO 4D RING SIZE ;  Surgeon: Dusty Sudie DEL, MD;  Location: [facility] OR;  Service: Open Heart Surgery;  Laterality: Right;   PROSTATECTOMY     RIGHT/LEFT HEART  CATH AND CORONARY ANGIOGRAPHY N/A 12/22/2019   Procedure: RIGHT/LEFT HEART CATH AND CORONARY ANGIOGRAPHY;  Surgeon: Wonda Sharper, MD;  Location: [facility] INVASIVE CV LAB;  Service: Cardiovascular;  Laterality: N/A;   TEE WITHOUT CARDIOVERSION N/A 12/22/2019   Procedure: TRANSESOPHAGEAL ECHOCARDIOGRAM (TEE);  Surgeon: Barbaraann Darryle Ned, MD;  Location: [facility] ENDOSCOPY;  Service: Cardiovascular;  Laterality: N/A;   TEE WITHOUT CARDIOVERSION N/A 04/11/2020   Procedure: TRANSESOPHAGEAL ECHOCARDIOGRAM (TEE);  Surgeon: Dusty Sudie DEL, MD;  Location: [facility] OR;  Service: Open Heart Surgery;  Laterality: N/A;   TONSILLECTOMY     TOOTH EXTRACTION  03/07/2020   TYMPANOPLASTY  01/2016   Patient Active Problem List   Diagnosis Date Noted   Incisional hernia 05/14/2021   S/P minimally invasive mitral valve repair 04/11/2020   Mitral regurgitation 12/20/2019   VARICES OF OTHER SITES 12/30/2007   History of colonic polyps 12/25/2007    ONSET DATE: diagnosed one year ago; script 11/06/23  REFERRING DIAG: Hypophonia  THERAPY DIAG:  Dysarthria and anarthria  Dysphagia, oropharyngeal phase  Rationale for Evaluation and Treatment: Rehabilitation  SUBJECTIVE:   SUBJECTIVE STATEMENT: It's (MBS) later today.  Pt accompanied by: self  PERTINENT HISTORY: See above. Pt dx'd with PD August 2024 with sx prior to that.  PAIN:  Are you having pain? No  FALLS:  Has patient fallen in last 6 months?  See PT evaluation for details   PATIENT GOALS: Improve speech and swallowing  OBJECTIVE:  Note: Objective measures were completed at Evaluation unless otherwise noted.   PATIENT REPORTED OUTCOME MEASURES (PROM): Communication Participation Item Bank: provided in first 2 sessions                                                                                                                            TREATMENT DATE:   12/22/23: NEEDS CPIB NEXT SESSION. Explained the nature of fluoroscopy with pt, and that  this machine will record his swallowing with food textures and liquids. Explained about dB being a logarithmic scale. SLP shaped pt's loud /a/ to average 82dB, started at 73dB. Pt read sentences and req'd SLP usual mod-max cues to use intent and purpose with effortful voice. Digital recording provided beneficial auditory feedback of pt's productions. Homework to read out loud 30 seconds x10, and perform 10 loud /a/, BID.  12/09/23: n/a  PATIENT EDUCATION: Education details: See treatment date Person educated: Patient Education method: Explanation Education comprehension: verbalized understanding and needs further education  HOME EXERCISE PROGRAM: Eventually dysarthria program for louder speech, possible dysphagia program   GOALS: Goals reviewed with patient? Yes, generally  SHORT TERM GOALS: Target date: 01/09/24  Pt will undergo MBS if clinically indicated Baseline: Goal status: met  2.  Pt will demo 18/20 sentences with WNL volume Baseline:  Goal status: INITIAL  3.  Pt will demo WNL volume in 5 minutes conversation Baseline:  Goal status: INITIAL  4.  Pt will complete dysphagia HEP (if indicated following MBS) with rare min A Baseline:  Goal status: INITIAL   LONG TERM GOALS: Target date: 02/06/24  Pt will have improved PROM Baseline:  Goal status: INITIAL  2.  Pt will demo WNL volume in 10 minutes conversation Baseline:  Goal status: INITIAL  3.  Pt will complete dysphagia HEP (if indicated following MBS) with mod I in 3 sessions Baseline:  Goal status: INITIAL  4.  Pt will demo knowledge about where to find reputable info about speech/language info with Parkinson's on the internet Baseline:  Goal status: INITIAL   ASSESSMENT:  CLINICAL IMPRESSION: Patient is a 73 y.o. M who was seen today for treatment of speech and language in light of PD. Pt has MBS later today. See treatment date above for today's date for further details on today's session. On date  of eval he demonstrated incr'd volume and intent with short conversation today proving to be a good candidate for skilled ST. Wife states pt's volume is such that she has to ask him to repeat more often than she had to 5-6 years ago.    OBJECTIVE IMPAIRMENTS: Objective impairments include dysarthria and dysphagia. These impairments are limiting patient from household responsibilities, ADLs/IADLs, effectively communicating at home and in community, and safety when swallowing.Factors affecting potential to achieve goals and functional outcome are none noted today.  Patient will benefit from skilled SLP services to address above impairments and improve overall function.  REHAB POTENTIAL: Good  PLAN:  SLP FREQUENCY: 2x/week  SLP DURATION: 8 weeks  PLANNED INTERVENTIONS: Aspiration precaution training, Pharyngeal strengthening exercises, Diet toleration management , Cueing hierachy, Internal/external aids, Oral motor exercises, Functional tasks, SLP instruction and feedback, Compensatory strategies, Patient/family education, [phone] Treatment of speech (30 or 45 min) , and 07473 Treatment of swallowing function    Malavika Lira, CCC-SLP 12/22/2023, 8:38 AM

## 2023-12-24 ENCOUNTER — Ambulatory Visit

## 2023-12-24 ENCOUNTER — Encounter: Payer: Self-pay | Admitting: Physical Therapy

## 2023-12-24 ENCOUNTER — Ambulatory Visit: Admitting: Physical Therapy

## 2023-12-24 DIAGNOSIS — R2681 Unsteadiness on feet: Secondary | ICD-10-CM

## 2023-12-24 DIAGNOSIS — R2689 Other abnormalities of gait and mobility: Secondary | ICD-10-CM

## 2023-12-24 DIAGNOSIS — G20A1 Parkinson's disease without dyskinesia, without mention of fluctuations: Secondary | ICD-10-CM

## 2023-12-24 DIAGNOSIS — M6281 Muscle weakness (generalized): Secondary | ICD-10-CM

## 2023-12-24 DIAGNOSIS — R29898 Other symptoms and signs involving the musculoskeletal system: Secondary | ICD-10-CM

## 2023-12-24 DIAGNOSIS — R278 Other lack of coordination: Secondary | ICD-10-CM

## 2023-12-24 NOTE — Therapy (Signed)
 OUTPATIENT PHYSICAL THERAPY NEURO TREATMENT NOTE   Patient Name: Tyler [name] MRN: [id number] DOB:Mar 01, 1951, 73 y.o., male Today's Date: 12/24/2023   PCP: Janey Santos, MD REFERRING PROVIDER: Evonnie Asberry RAMAN, DO  END OF SESSION:  PT End of Session - 12/24/23 1452     Visit Number 3    Number of Visits 14    Date for Recertification  02/10/24    Authorization Type [employer]    PT Start Time 1451    PT Stop Time 1530    PT Time Calculation (min) 39 min    Equipment Utilized During Treatment Gait belt    Activity Tolerance Patient tolerated treatment well    Behavior During Therapy WFL for tasks assessed/performed            Past Medical History:  Diagnosis Date   Adenomatous polyp of colon 02/2004   Anemia    Anxiety    Arthritis    BPH (benign prostatic hyperplasia)    Colonic varices    Degenerative joint disease    Depression    Depression    Fatty liver    GERD (gastroesophageal reflux disease)    Heart murmur    Hemorrhoids    Hyperlipidemia    Hypertension    IBS (irritable bowel syndrome)    Prostate cancer (HCC) 2011   S/P minimally invasive mitral valve repair 04/11/2020   Complex valvuloplasty including artificial Gore-tex neochord placement x8 with suture plication of posterior commissure and 30 mm Sorin Memo 3D ring annuloplasty via right mini thoracotomy approach   Thrombocytopenia    Thyroid  nodule    Past Surgical History:  Procedure Laterality Date   BUBBLE STUDY  12/22/2019   Procedure: BUBBLE STUDY;  Surgeon: Barbaraann Darryle Ned, MD;  Location: [facility] ENDOSCOPY;  Service: Cardiovascular;;   CARDIAC CATHETERIZATION     COLONOSCOPY     MASS EXCISION Right 05/14/2021   Procedure: REPAIR RIGHT CHEST WALL HERNIA WITH MESH;  Surgeon: Kerrin Elspeth BROCKS, MD;  Location: [facility] OR;  Service: Thoracic;  Laterality: Right;   MITRAL VALVE REPAIR Right 04/11/2020   Procedure: MINIMALLY INVASIVE MITRAL VALVE REPAIR (MVR) USING MEMO 4D RING  SIZE ;  Surgeon: Dusty Sudie DEL, MD;  Location: [facility] OR;  Service: Open Heart Surgery;  Laterality: Right;   PROSTATECTOMY     RIGHT/LEFT HEART CATH AND CORONARY ANGIOGRAPHY N/A 12/22/2019   Procedure: RIGHT/LEFT HEART CATH AND CORONARY ANGIOGRAPHY;  Surgeon: Wonda Sharper, MD;  Location: [facility] INVASIVE CV LAB;  Service: Cardiovascular;  Laterality: N/A;   TEE WITHOUT CARDIOVERSION N/A 12/22/2019   Procedure: TRANSESOPHAGEAL ECHOCARDIOGRAM (TEE);  Surgeon: Barbaraann Darryle Ned, MD;  Location: [facility] ENDOSCOPY;  Service: Cardiovascular;  Laterality: N/A;   TEE WITHOUT CARDIOVERSION N/A 04/11/2020   Procedure: TRANSESOPHAGEAL ECHOCARDIOGRAM (TEE);  Surgeon: Dusty Sudie DEL, MD;  Location: [facility] OR;  Service: Open Heart Surgery;  Laterality: N/A;   TONSILLECTOMY     TOOTH EXTRACTION  03/07/2020   TYMPANOPLASTY  01/2016   Patient Active Problem List   Diagnosis Date Noted   Incisional hernia 05/14/2021   S/P minimally invasive mitral valve repair 04/11/2020   Mitral regurgitation 12/20/2019   VARICES OF OTHER SITES 12/30/2007   History of colonic polyps 12/25/2007    ONSET DATE: August 2024  REFERRING DIAG:  G20.A1 (ICD-10-CM) - Parkinson's disease without dyskinesia or fluctuating manifestations (HCC)    THERAPY DIAG:  Unsteadiness on feet  Other abnormalities of gait and mobility  Rationale for Evaluation and Treatment: Rehabilitation  SUBJECTIVE:                                                                                                                                                                                             SUBJECTIVE STATEMENT: Nothing new too much.  Still some wobbliness. Pt accompanied by: self  PERTINENT HISTORY: hx of mitral valve replacement, tendon problem in R shoulder (hx of this, tendon detached from shoulder), hx of L RTC and R RTC issues  PAIN:  Are you having pain? No  PRECAUTIONS: None  RED FLAGS: None   WEIGHT BEARING RESTRICTIONS:    FALLS: Has patient fallen in last 6 months? one fall a couple weeks ago on his treadmill  LIVING ENVIRONMENT: Lives with: lives with their spouse Lives in: House/apartment Stairs: stairs inside/outside, does have chair lift available if needed Has following equipment at home: None  PLOF: Independent  Exercise routine at home:  treadmill at home x 30 minutes, light weights, and some balance exercises 3-4 days per week.  PATIENT GOALS: balance  OBJECTIVE:    TODAY'S TREATMENT: 12/24/2023 Activity Comments  NuStep, Level 3, 4 extremities x 6 minutes Varied speed intervals for effort/intensity (self-selected pace 75-80); >95-100 at increased intensity; rates as 7-8/10  Standing PWR! Moves: -PWR! Up x 10 -PWR! Rock 2 x 10, progressing to SLS -PWR! Twist x 10, reach across body -PWR! Step x 10 Visual and verbal cues; more focus on lower body and balance  Reviewed back step and weightshift Cues to hinge at hips for improved balance  Forward step and weightshift, x 10 reps each side   Standing on Airex: Feet apart/feet together with EO and EC 30 sec, then EO head turns/nods Mild sway   Short distance gait with cues to look ahead at targets  For improved posture and less veering with gait     Access Code: [email or web address] URL: [email or web address] Date: 12/17/2023, 12/24/2023 Prepared by: [facility] - Outpatient  Rehab - [facility]  *PWR MOVES STANDING, 10-20 reps 1x/day Exercises - Alternating Step Backward with Support  - 1 x daily - 7 x weekly - 1-2 sets - 10 reps  PATIENT EDUCATION: Education details: HEP additions for PWR! Moves standing Person educated: Patient Education method: Explanation, Demonstration, and Handouts Education comprehension: verbalized understanding, returned demonstration, and needs further education  ---------------------------------- Note: Objective measures were completed at Evaluation unless otherwise noted.  DIAGNOSTIC FINDINGS:    COGNITION: Overall cognitive status: Within functional limits for tasks assessed   SENSATION: WFL  COORDINATION: intact  EDEMA:  none  MUSCLE TONE: slight, 1+ bilateral biceps  MUSCLE LENGTH: WNL  DTRs:  NT  POSTURE: No Significant postural limitations  LOWER EXTREMITY ROM:     WNL  LOWER EXTREMITY MMT:    5/5  BED MOBILITY:  indep  TRANSFERS: indep    CURB:  Findings: indep  STAIRS: Not tested--reports independent but relies on HR for security GAIT: Findings: Comments: no deviations, decreased speed  FUNCTIONAL TESTS:  5 times sit to stand: 11 sec 10 meter walk test: 12.13 sec = 2.7 ft/sec Mini-BESTest: 22/28 : 5.3 sec                                                                                                                                TREATMENT DATE: 12/09/23    PATIENT EDUCATION: Education details: discussed relevant exercise intervention strategies/methods for tx of PD, initiated discussion of group classes in community, rationale of interventions Person educated: Patient and Spouse Education method: Explanation Education comprehension: needs further education  HOME EXERCISE PROGRAM: TBD  GOALS: Goals reviewed with patient? Yes  SHORT TERM GOALS: Target date: 01/06/2024    Patient will be independent in HEP to improve functional outcomes Baseline: Goal status: IN PROGRESS  2.  Demo reduced risk for falls per time 4.8 sec 3-Meter Backwards Walk Test Baseline: 5.3 sec Goal status: IN PROGRESS  3.  Demo ability to sustain speed of 3.25 ft/sec during ambulation to improve community mobility Baseline: 2.7 ft/sec Goal status: IN PROGRESS   LONG TERM GOALS: Target date: 02/10/2024    Pt to verbalize understanding or participation in community-based classes for people with Parkinson's to improve activity carryover Baseline:  Goal status: IN PROGRESS  2.  Demo improved balance and reduced fall risk per score 25/28  Mini-BESTest Baseline: 22/28 Goal status: IN PROGRESS  3.  Patient to teach-back metrics for defining and executing high intensity training Baseline:  Goal status: IN PROGRESS   ASSESSMENT:  CLINICAL IMPRESSION: Pt presents today and reports some wobbliness with gait at times (like in his hallway in the morning). Skilled PT session focused on aerobic warm up focusing on short intervals of increased intensity, PWR! Moves to address posture, weightshifting, trunk rotation, step strategy, and balance on compliant surface.  With eyes closed on compliant surface, he feels the same sway/unsteadiness that he calls wobbliness, so will likely need to further address balance in multi-sensory conditions.  Updated HEP to add PWR! Moves, as he does well with them today with focus on lower body/balance portion and less strenuous upper body movements to avoid shoulder pain.  Pt will continue to benefit from skilled PT towards goals for improved functional mobility and decreased fall risk.   OBJECTIVE IMPAIRMENTS: decreased activity tolerance, decreased balance, decreased endurance, decreased knowledge of condition, difficulty walking, and impaired tone.   ACTIVITY LIMITATIONS: carrying, lifting, stairs, and locomotion level  PARTICIPATION LIMITATIONS: community activity, yard work, and exercise routine  PERSONAL FACTORS: Age, Time since onset of injury/illness/exacerbation, and 1 comorbidity: PMH are also affecting patient's functional outcome.  REHAB POTENTIAL: Excellent  CLINICAL DECISION MAKING: Evolving/moderate complexity  EVALUATION COMPLEXITY: Moderate  PLAN:  PT FREQUENCY: 2x/wk x 4 wks, then 1x/wk x 4 wks  PT DURATION: 8 weeks  PLANNED INTERVENTIONS: 97750- Physical Performance Testing, 97110-Therapeutic exercises, 97530- Therapeutic activity, [id number]- Neuromuscular re-education, 97535- Self Care, 02859- Manual therapy, [id number]- Gait training, [phone]- Canalith repositioning, [id number]- Aquatic  Therapy, and Patient/Family education  PLAN FOR NEXT SESSION: Review PWR! Moves with more focus on lower body and less intense focus on upper body (due to pt's hx of shoulder issues).  Work on step strategy varied directions, compliant surface balance, high intensity interval training, gait activities and relevant metrics for self-monitoring   Greig Anon, PT 12/24/23 5:28 PM Phone: [phone] Fax: [phone]  [facility] at [facility] [address], [address] [place], [place] [address] Phone # [phone] Fax # [phone]

## 2023-12-24 NOTE — Therapy (Signed)
 OUTPATIENT OCCUPATIONAL THERAPY PARKINSON'S EVALUATION  Patient Name: Tyler [name] MRN: [id number] DOB:[birth year], 73 y.o., male Today's Date: 12/24/2023  PCP: Janey Santos REFERRING PROVIDER: Evonnie Asberry RAMAN, DO  END OF SESSION:   OT End of Session - 12/24/23 1520     Visit Number 1    Number of Visits 17   including eval   Date for Recertification  02/19/24    Authorization Type [employer]    OT Start Time 1400    OT Stop Time 1445    OT Time Calculation (min) 45 min    Equipment Utilized During Treatment testing materials    Activity Tolerance Patient tolerated treatment well    Behavior During Therapy [facility] for tasks assessed/performed          Past Medical History:  Diagnosis Date   Adenomatous polyp of colon 02/2004   Anemia    Anxiety    Arthritis    BPH (benign prostatic hyperplasia)    Colonic varices    Degenerative joint disease    Depression    Depression    Fatty liver    GERD (gastroesophageal reflux disease)    Heart murmur    Hemorrhoids    Hyperlipidemia    Hypertension    IBS (irritable bowel syndrome)    Prostate cancer (HCC) 2011   S/P minimally invasive mitral valve repair 04/11/2020   Complex valvuloplasty including artificial Gore-tex neochord placement x8 with suture plication of posterior commissure and 30 mm Sorin Memo 3D ring annuloplasty via right mini thoracotomy approach   Thrombocytopenia    Thyroid  nodule    Past Surgical History:  Procedure Laterality Date   BUBBLE STUDY  12/22/2019   Procedure: BUBBLE STUDY;  Surgeon: Barbaraann Darryle Ned, MD;  Location: [facility] ENDOSCOPY;  Service: Cardiovascular;;   CARDIAC CATHETERIZATION     COLONOSCOPY     MASS EXCISION Right 05/14/2021   Procedure: REPAIR RIGHT CHEST WALL HERNIA WITH MESH;  Surgeon: Kerrin Elspeth BROCKS, MD;  Location: [facility] OR;  Service: Thoracic;  Laterality: Right;   MITRAL VALVE REPAIR Right 04/11/2020   Procedure: MINIMALLY INVASIVE MITRAL VALVE REPAIR (MVR)  USING MEMO 4D RING SIZE ;  Surgeon: Dusty Sudie DEL, MD;  Location: [facility] OR;  Service: Open Heart Surgery;  Laterality: Right;   PROSTATECTOMY     RIGHT/LEFT HEART CATH AND CORONARY ANGIOGRAPHY N/A 12/22/2019   Procedure: RIGHT/LEFT HEART CATH AND CORONARY ANGIOGRAPHY;  Surgeon: Wonda Sharper, MD;  Location: [facility] INVASIVE CV LAB;  Service: Cardiovascular;  Laterality: N/A;   TEE WITHOUT CARDIOVERSION N/A 12/22/2019   Procedure: TRANSESOPHAGEAL ECHOCARDIOGRAM (TEE);  Surgeon: Barbaraann Darryle Ned, MD;  Location: [facility] ENDOSCOPY;  Service: Cardiovascular;  Laterality: N/A;   TEE WITHOUT CARDIOVERSION N/A 04/11/2020   Procedure: TRANSESOPHAGEAL ECHOCARDIOGRAM (TEE);  Surgeon: Dusty Sudie DEL, MD;  Location: [facility] OR;  Service: Open Heart Surgery;  Laterality: N/A;   TONSILLECTOMY     TOOTH EXTRACTION  03/07/2020   TYMPANOPLASTY  01/2016   Patient Active Problem List   Diagnosis Date Noted   Incisional hernia 05/14/2021   S/P minimally invasive mitral valve repair 04/11/2020   Mitral regurgitation 12/20/2019   VARICES OF OTHER SITES 12/30/2007   History of colonic polyps 12/25/2007    ONSET DATE: 12/09/2023 referral date; diagnosed with PD in 10/2022  REFERRING DIAG:  Diagnosis  G20.A1 (ICD-10-CM) - Parkinson's disease without dyskinesia or fluctuating manifestations (HCC)    THERAPY DIAG:  Parkinson's disease without dyskinesia or fluctuating manifestations (HCC)  Other lack  of coordination  Muscle weakness (generalized)  Other symptoms and signs involving the musculoskeletal system  Rationale for Evaluation and Treatment: Rehabilitation  SUBJECTIVE:   SUBJECTIVE STATEMENT: Doing alright today, hope you are. Pt accompanied by: self  PERTINENT HISTORY: hx of mitral valve replacement, prostate cancer  PRECAUTIONS: Other: mitral valve repair with ring, fall, cancer hx, HTN  WEIGHT BEARING RESTRICTIONS: No  PAIN:  Are you having pain? No  FALLS: Has patient fallen in last  6 months? Yes. Number of falls 1, treadmill  LIVING ENVIRONMENT: Lives with: lives with their spouse Lives in: House/apartment Stairs: stairs inside/outside, does have chair lift available if needed Has following equipment at home: Wheelchair (manual), shower chair, and Grab bars  PLOF: Independent  PATIENT GOALS: I want to maintain function as long as possible. Several years ago my strength kind of went away.   OBJECTIVE:  Note: Objective measures were completed at Evaluation unless otherwise noted.  HAND DOMINANCE: Right  ADLs: Overall ADLs: Reports independence but some tasks that require FM coordination takes longer Transfers/ambulation related to ADLs: I/Mod I Eating: Independent Grooming: Independent UB Dressing: Difficulty buttoning LB Dressing: Difficulty buttoning pants Toileting: Independent Bathing:  Mod Independent Tub Shower transfers: Mod Independent Equipment: Shower seat with back, Grab bars, and Walk in shower  IADLs: Shopping: Wife usually completes even before diagnosis Light housekeeping: Completing laundry and washes dishes  Meal Prep: wife completes cooking even before PD Community mobility: Driving Medication management: Completes Landscape architect: Independent Handwriting: 100% legible and Mild micrographia  MOBILITY STATUS: Independent and Hx of falls  POSTURE COMMENTS:  No Significant postural limitations  ACTIVITY TOLERANCE: Activity tolerance: Pt reports no changes  FUNCTIONAL OUTCOME MEASURES: Fastening/unfastening 3 buttons: 34.54 seconds Physical performance test: PPT#2 (simulated eating) 17.50 seconds & PPT#4 (donning/doffing jacket): 23.04 seconds  COORDINATION: 9 Hole Peg test: Right: 40.30 sec; Left: 38.43 sec Box and Blocks:  Right 32 blocks, Left 38 blocks (Did at first bring R hand around standing piece but corrected with verbal cue)  UE ROM:  WFL  UE MMT:   4/5 grossly  SENSATION:  WFL; however reports occasional  numbness in thenar eminence of R thumb, reports doctor said it's most likely d/t overuse. Was a Engineer, civil (consulting) for work.  MUSCLE TONE: RUE: Within functional limits and LUE: Within functional limits  COGNITION: Overall cognitive status: Within functional limits for tasks assessed  OBSERVATIONS: decreased strength, decreased FM coordination especially in R dominant hand, difficulty with ADLs especially buttoning/zipping up jacket, pt reports micrographia when writing longer sentences as well                                                                                                                    TREATMENT DATE: 12/24/23  Pt educated in purpose of OT, goals, and POC with pt verbalized understanding and agreement. Pt provided handout with information for PWR!Moves class to maintain strength and joint mobility/flexibility, pt agreed to look into it stating Anything helps.  PATIENT EDUCATION: Education details: Purpose  of OT, goals, POC, community resources (PWR!Moves class) Person educated: Patient Education method: Handouts Education comprehension: verbalized understanding  HOME EXERCISE PROGRAM:   GOALS: Goals reviewed with patient? Yes  SHORT TERM GOALS: Target date: 01/21/24  Pt will verbalize understanding of adaptive strategies to increase ease with ADLS/IADLS   Baseline: New to OP OT Goal status: INITIAL  2.  Pt will demonstrate improved ease with fastening buttons as evidenced by decreasing 3 button/unbutton time by at least 5 seconds  Baseline: 34.54 seconds Goal status: INITIAL  3.  Pt will demonstrate improved FM coordination and functional use of RUE by increasing Box and Blocks score by at least 3 blocks Baseline: Right 32 blocks, Left 38 blocks Goal status: INITIAL  4.  Pt will be independent with HEPs for UE strength and FM coordination with handouts for reference prn  Baseline: New to OP OT  Goal status: INITIAL  5. Pt will write short paragraph  with 75% legibility and mild to no micrographia Baseline:  Wrote short sentence ([place]) with 100% legible and Mild micrographia. Pt reports micrographia worsens the longer he writes. Goal status: INITIAL  6. OT will assess pt PSFS and create goal as appropriate  Baseline: Not yet established  Goal status: INITIAL  LONG TERM GOALS: Target date: 02/18/24  Pt will write short paragraph with 100% legibility and mild to no micrographia Baseline:  Wrote short sentence ([place]) with 100% legible and Mild micrographia. Pt reports micrographia worsens the longer he writes. Goal status: INITIAL  2.  Pt will demonstrate improved fine motor coordination for ADLs as evidenced by decreasing 9 hole peg test score for R hand by at least 3 secs  Baseline: Right: 40.30 sec; Left: 38.43 sec Goal status: INITIAL  3.  Pt will demonstrate increased ease with dressing as evidenced by decreasing PPT#4 (don/ doff jacket) to 20 secs or less  Baseline: PPT#4 (donning/doffing jacket): 23.04 seconds Goal status: INITIAL   4.  Pt will verbalize understanding of ways to prevent future PD related complications and PD community resources. Baseline: Initiated with PWR!Moves classes Goal status: INITIAL   5.  Pt will demonstrate improved FM coordination and functional use of RUE by increasing Box and Blocks score by at least 6 blocks Baseline: Right 32 blocks, Left 38 blocks Goal status: INITIAL   ASSESSMENT:  CLINICAL IMPRESSION: Patient is a 73 y.o. male who was seen today for occupational therapy evaluation for PD management. Hx includes mitral valve replacement, prostate cancer, HTN. Patient presents to clinic seeking skilled therapy services to better manage occupational barriers secondary to Parkinson's and improve independence and safety with ADLs and IADLs.     PERFORMANCE DEFICITS: in functional skills including ADLs, IADLs, coordination, dexterity, strength,  Fine motor control, balance, cardiopulmonary status limiting function, and UE functional use, cognitive skills including safety awareness and sequencing, and psychosocial skills including coping strategies and environmental adaptation.   IMPAIRMENTS: are limiting patient from ADLs, IADLs, and leisure.   COMORBIDITIES:  has co-morbidities such as prostate cancer and mitral valve replacement hx that affects occupational performance. Patient will benefit from skilled OT to address above impairments and improve overall function.  MODIFICATION OR ASSISTANCE TO COMPLETE EVALUATION: Min-Moderate modification of tasks or assist with assess necessary to complete an evaluation.  OT OCCUPATIONAL PROFILE AND HISTORY: Detailed assessment: Review of records and additional review of physical, cognitive, psychosocial history related to current functional performance.  CLINICAL DECISION MAKING: Moderate -  several treatment options, min-mod task modification necessary  REHAB POTENTIAL: Good  EVALUATION COMPLEXITY: Moderate    PLAN:  OT FREQUENCY: 2x/week  OT DURATION: 8 weeks  PLANNED INTERVENTIONS: 97168 OT Re-evaluation, 97535 self care/ADL training, 02889 therapeutic exercise, 97530 therapeutic activity, 97112 neuromuscular re-education, 97140 manual therapy, passive range of motion, functional mobility training, energy conservation, coping strategies training, patient/family education, and DME and/or AE instructions  RECOMMENDED OTHER SERVICES: none noted   CONSULTED AND AGREED WITH PLAN OF CARE: Patient  PLAN FOR NEXT SESSION: PSFS Coordination HEP BUE HEP (isometrics or theraband) AE education   [place], OT 12/24/2023, 3:21 PM

## 2023-12-29 ENCOUNTER — Ambulatory Visit

## 2023-12-29 DIAGNOSIS — R2689 Other abnormalities of gait and mobility: Secondary | ICD-10-CM

## 2023-12-29 DIAGNOSIS — R2681 Unsteadiness on feet: Secondary | ICD-10-CM

## 2023-12-29 DIAGNOSIS — M6281 Muscle weakness (generalized): Secondary | ICD-10-CM

## 2023-12-29 DIAGNOSIS — G20A1 Parkinson's disease without dyskinesia, without mention of fluctuations: Secondary | ICD-10-CM

## 2023-12-29 DIAGNOSIS — R29898 Other symptoms and signs involving the musculoskeletal system: Secondary | ICD-10-CM

## 2023-12-29 NOTE — Therapy (Signed)
 OUTPATIENT PHYSICAL THERAPY NEURO TREATMENT NOTE   Patient Name: Tyler [name] MRN: [id number] DOB:[birth year], 73 y.o., male Today's Date: 12/29/2023   PCP: Janey Santos, MD REFERRING PROVIDER: Evonnie Asberry RAMAN, DO  END OF SESSION:  PT End of Session - 12/29/23 0930     Visit Number 4    Number of Visits 14    Date for Recertification  02/10/24    Authorization Type [employer]    PT Start Time 0930    PT Stop Time 1015    PT Time Calculation (min) 45 min    Equipment Utilized During Treatment Gait belt    Activity Tolerance Patient tolerated treatment well    Behavior During Therapy WFL for tasks assessed/performed            Past Medical History:  Diagnosis Date   Adenomatous polyp of colon 02/2004   Anemia    Anxiety    Arthritis    BPH (benign prostatic hyperplasia)    Colonic varices    Degenerative joint disease    Depression    Depression    Fatty liver    GERD (gastroesophageal reflux disease)    Heart murmur    Hemorrhoids    Hyperlipidemia    Hypertension    IBS (irritable bowel syndrome)    Prostate cancer (HCC) 2011   S/P minimally invasive mitral valve repair 04/11/2020   Complex valvuloplasty including artificial Gore-tex neochord placement x8 with suture plication of posterior commissure and 30 mm Sorin Memo 3D ring annuloplasty via right mini thoracotomy approach   Thrombocytopenia    Thyroid  nodule    Past Surgical History:  Procedure Laterality Date   BUBBLE STUDY  12/22/2019   Procedure: BUBBLE STUDY;  Surgeon: Barbaraann Darryle Ned, MD;  Location: [facility] ENDOSCOPY;  Service: Cardiovascular;;   CARDIAC CATHETERIZATION     COLONOSCOPY     MASS EXCISION Right 05/14/2021   Procedure: REPAIR RIGHT CHEST WALL HERNIA WITH MESH;  Surgeon: Kerrin Elspeth BROCKS, MD;  Location: [facility] OR;  Service: Thoracic;  Laterality: Right;   MITRAL VALVE REPAIR Right 04/11/2020   Procedure: MINIMALLY INVASIVE MITRAL VALVE REPAIR (MVR) USING MEMO 4D RING  SIZE ;  Surgeon: Dusty Sudie DEL, MD;  Location: [facility] OR;  Service: Open Heart Surgery;  Laterality: Right;   PROSTATECTOMY     RIGHT/LEFT HEART CATH AND CORONARY ANGIOGRAPHY N/A 12/22/2019   Procedure: RIGHT/LEFT HEART CATH AND CORONARY ANGIOGRAPHY;  Surgeon: Wonda Sharper, MD;  Location: [facility] INVASIVE CV LAB;  Service: Cardiovascular;  Laterality: N/A;   TEE WITHOUT CARDIOVERSION N/A 12/22/2019   Procedure: TRANSESOPHAGEAL ECHOCARDIOGRAM (TEE);  Surgeon: Barbaraann Darryle Ned, MD;  Location: [facility] ENDOSCOPY;  Service: Cardiovascular;  Laterality: N/A;   TEE WITHOUT CARDIOVERSION N/A 04/11/2020   Procedure: TRANSESOPHAGEAL ECHOCARDIOGRAM (TEE);  Surgeon: Dusty Sudie DEL, MD;  Location: [facility] OR;  Service: Open Heart Surgery;  Laterality: N/A;   TONSILLECTOMY     TOOTH EXTRACTION  03/07/2020   TYMPANOPLASTY  01/2016   Patient Active Problem List   Diagnosis Date Noted   Incisional hernia 05/14/2021   S/P minimally invasive mitral valve repair 04/11/2020   Mitral regurgitation 12/20/2019   VARICES OF OTHER SITES 12/30/2007   History of colonic polyps 12/25/2007    ONSET DATE: August 2024  REFERRING DIAG:  G20.A1 (ICD-10-CM) - Parkinson's disease without dyskinesia or fluctuating manifestations (HCC)    THERAPY DIAG:  Parkinson's disease without dyskinesia or fluctuating manifestations (HCC)  Muscle weakness (generalized)  Other symptoms and signs  involving the musculoskeletal system  Unsteadiness on feet  Other abnormalities of gait and mobility  Rationale for Evaluation and Treatment: Rehabilitation  SUBJECTIVE:                                                                                                                                                                                             SUBJECTIVE STATEMENT: Still using treadmill most days of the week.  Pt accompanied by: self  PERTINENT HISTORY: hx of mitral valve replacement, tendon problem in R shoulder (hx of  this, tendon detached from shoulder), hx of L RTC and R RTC issues  PAIN:  Are you having pain? No  PRECAUTIONS: None  RED FLAGS: None   WEIGHT BEARING RESTRICTIONS:   FALLS: Has patient fallen in last 6 months? one fall a couple weeks ago on his treadmill  LIVING ENVIRONMENT: Lives with: lives with their spouse Lives in: House/apartment Stairs: stairs inside/outside, does have chair lift available if needed Has following equipment at home: None  PLOF: Independent  Exercise routine at home:  treadmill at home x 30 minutes, light weights, and some balance exercises 3-4 days per week.  PATIENT GOALS: balance  OBJECTIVE:   TODAY'S TREATMENT: 12/29/23 Activity Comments  Standing PWR 1x10  4-square -canes, then canes w/ obstacles -6 hurdles, then w/ obstacles  High step suitcase carry 6 hurdles  Lateral step over 6 hurdles  Rocker board -lateral x 60 sec, then w/ head nods         TODAY'S TREATMENT: 12/24/2023 Activity Comments  NuStep, Level 3, 4 extremities x 6 minutes Varied speed intervals for effort/intensity (self-selected pace 75-80); >95-100 at increased intensity; rates as 7-8/10  Standing PWR! Moves: -PWR! Up x 10 -PWR! Rock 2 x 10, progressing to SLS -PWR! Twist x 10, reach across body -PWR! Step x 10 Visual and verbal cues; more focus on lower body and balance  Reviewed back step and weightshift Cues to hinge at hips for improved balance  Forward step and weightshift, x 10 reps each side   Standing on Airex: Feet apart/feet together with EO and EC 30 sec, then EO head turns/nods Mild sway   Short distance gait with cues to look ahead at targets  For improved posture and less veering with gait     Access Code: [email or web address] URL: [email or web address] Date: 12/17/2023, 12/24/2023 Prepared by: [facility] - Outpatient  Re[facility] - [facility]  *PWR MOVES STANDING, 10-20 reps 1x/day Exercises - Alternating Step Backward with Support  - 1 x  daily - 7 x weekly - 1-2 sets - 10 reps  PATIENT EDUCATION: Education details: HEP  additions for PWR! Moves standing Person educated: Patient Education method: Explanation, Demonstration, and Handouts Education comprehension: verbalized understanding, returned demonstration, and needs further education  ---------------------------------- Note: Objective measures were completed at Evaluation unless otherwise noted.  DIAGNOSTIC FINDINGS:   COGNITION: Overall cognitive status: Within functional limits for tasks assessed   SENSATION: WFL  COORDINATION: intact  EDEMA:  none  MUSCLE TONE: slight, 1+ bilateral biceps  MUSCLE LENGTH: WNL  DTRs:  NT  POSTURE: No Significant postural limitations  LOWER EXTREMITY ROM:     WNL  LOWER EXTREMITY MMT:    5/5  BED MOBILITY:  indep  TRANSFERS: indep    CURB:  Findings: indep  STAIRS: Not tested--reports independent but relies on HR for security GAIT: Findings: Comments: no deviations, decreased speed  FUNCTIONAL TESTS:  5 times sit to stand: 11 sec 10 meter walk test: 12.13 sec = 2.7 ft/sec Mini-BESTest: 22/28 : 5.3 sec                                                                                                                                TREATMENT DATE: 12/09/23    PATIENT EDUCATION: Education details: discussed relevant exercise intervention strategies/methods for tx of PD, initiated discussion of group classes in community, rationale of interventions Person educated: Patient and Spouse Education method: Explanation Education comprehension: needs further education  HOME EXERCISE PROGRAM: TBD  GOALS: Goals reviewed with patient? Yes  SHORT TERM GOALS: Target date: 01/06/2024    Patient will be independent in HEP to improve functional outcomes Baseline: Goal status: IN PROGRESS  2.  Demo reduced risk for falls per time 4.8 sec 3-Meter Backwards Walk Test Baseline: 5.3 sec Goal  status: IN PROGRESS  3.  Demo ability to sustain speed of 3.25 ft/sec during ambulation to improve community mobility Baseline: 2.7 ft/sec Goal status: IN PROGRESS   LONG TERM GOALS: Target date: 02/10/2024    Pt to verbalize understanding or participation in community-based classes for people with Parkinson's to improve activity carryover Baseline:  Goal status: IN PROGRESS  2.  Demo improved balance and reduced fall risk per score 25/28 Mini-BESTest Baseline: 22/28 Goal status: IN PROGRESS  3.  Patient to teach-back metrics for defining and executing high intensity training Baseline:  Goal status: IN PROGRESS   ASSESSMENT:  CLINICAL IMPRESSION: Focus on dynamic balance activities with emphasis for initiating movement, change of direction, single limb support, and withstanding postural perturbations to improve weight shift and facilitation of righting reactions.  Greatest unsteadiness noted when first initiating movement and placed into single limb support.  Discussed strategies for step initiation for improved postural control and cognitive override for freezing of gait strategies.  Continued sessions to advance POC details to improve mobility and reduce risk for falls   OBJECTIVE IMPAIRMENTS: decreased activity tolerance, decreased balance, decreased endurance, decreased knowledge of condition, difficulty walking, and impaired tone.   ACTIVITY LIMITATIONS: carrying, lifting, stairs, and locomotion level  PARTICIPATION  LIMITATIONS: community activity, yard work, and exercise routine  PERSONAL FACTORS: Age, Time since onset of injury/illness/exacerbation, and 1 comorbidity: PMH are also affecting patient's functional outcome.   REHAB POTENTIAL: Excellent  CLINICAL DECISION MAKING: Evolving/moderate complexity  EVALUATION COMPLEXITY: Moderate  PLAN:  PT FREQUENCY: 2x/wk x 4 wks, then 1x/wk x 4 wks  PT DURATION: 8 weeks  PLANNED INTERVENTIONS: 97750- Physical  Performance Testing, 97110-Therapeutic exercises, 97530- Therapeutic activity, [id number]- Neuromuscular re-education, 97535- Self Care, [id number]- Manual therapy, [id number]- Gait training, [phone]- Canalith repositioning, [id number]- Aquatic Therapy, and Patient/Family education  PLAN FOR NEXT SESSION: Review PWR! Moves with more focus on lower body and less intense focus on upper body (due to pt's hx of shoulder issues).  Work on step strategy varied directions, compliant surface balance, high intensity interval training, gait activities and relevant metrics for self-monitoring  10:28 AM, 12/29/23 M. Kelly Temprence Rhines, PT, DPT Physical Therapist- [place] Office Number: [phone]

## 2023-12-31 ENCOUNTER — Ambulatory Visit

## 2023-12-31 DIAGNOSIS — M6281 Muscle weakness (generalized): Secondary | ICD-10-CM

## 2023-12-31 DIAGNOSIS — R278 Other lack of coordination: Secondary | ICD-10-CM

## 2023-12-31 DIAGNOSIS — R2689 Other abnormalities of gait and mobility: Secondary | ICD-10-CM

## 2023-12-31 DIAGNOSIS — G20A1 Parkinson's disease without dyskinesia, without mention of fluctuations: Secondary | ICD-10-CM

## 2023-12-31 DIAGNOSIS — R2681 Unsteadiness on feet: Secondary | ICD-10-CM | POA: Diagnosis not present

## 2023-12-31 DIAGNOSIS — R29898 Other symptoms and signs involving the musculoskeletal system: Secondary | ICD-10-CM

## 2023-12-31 NOTE — Patient Instructions (Addendum)
   Coordination Activities  Perform the following activities for 5-10 minutes 1 times per day with both hand(s).  Rotate ball in fingertips (clockwise and counter-clockwise). Deal cards with your thumb (Hold deck in hand and push card off top with thumb). Rotate card in hand (clockwise and counter-clockwise). Pick up coins and stack. Twirl pen between fingers. Screw together nuts and bolts, then unfasten.

## 2023-12-31 NOTE — Therapy (Signed)
 OUTPATIENT OCCUPATIONAL THERAPY PARKINSON'S TREATMENT  Patient Name: Tyler [name] MRN: [id number] DOB:1951-02-15, 73 y.o., male Today's Date: 12/31/2023  PCP: Janey Santos REFERRING PROVIDER: Evonnie Asberry RAMAN, DO  END OF SESSION:   OT End of Session - 12/31/23 0848     Visit Number 2    Number of Visits 17    Date for Recertification  02/19/24    Authorization Type [employer]    OT Start Time 0847    OT Stop Time 0930    OT Time Calculation (min) 43 min    Equipment Utilized During Treatment green theraband, FM coordination items    Activity Tolerance Patient tolerated treatment well    Behavior During Therapy [facility] for tasks assessed/performed          Past Medical History:  Diagnosis Date   Adenomatous polyp of colon 02/2004   Anemia    Anxiety    Arthritis    BPH (benign prostatic hyperplasia)    Colonic varices    Degenerative joint disease    Depression    Depression    Fatty liver    GERD (gastroesophageal reflux disease)    Heart murmur    Hemorrhoids    Hyperlipidemia    Hypertension    IBS (irritable bowel syndrome)    Prostate cancer (HCC) 2011   S/P minimally invasive mitral valve repair 04/11/2020   Complex valvuloplasty including artificial Gore-tex neochord placement x8 with suture plication of posterior commissure and 30 mm Sorin Memo 3D ring annuloplasty via right mini thoracotomy approach   Thrombocytopenia    Thyroid  nodule    Past Surgical History:  Procedure Laterality Date   BUBBLE STUDY  12/22/2019   Procedure: BUBBLE STUDY;  Surgeon: Barbaraann Darryle Ned, MD;  Location: [facility] ENDOSCOPY;  Service: Cardiovascular;;   CARDIAC CATHETERIZATION     COLONOSCOPY     MASS EXCISION Right 05/14/2021   Procedure: REPAIR RIGHT CHEST WALL HERNIA WITH MESH;  Surgeon: Kerrin Elspeth BROCKS, MD;  Location: [facility] OR;  Service: Thoracic;  Laterality: Right;   MITRAL VALVE REPAIR Right 04/11/2020   Procedure: MINIMALLY INVASIVE MITRAL VALVE REPAIR  (MVR) USING MEMO 4D RING SIZE ;  Surgeon: Dusty Sudie DEL, MD;  Location: [facility] OR;  Service: Open Heart Surgery;  Laterality: Right;   PROSTATECTOMY     RIGHT/LEFT HEART CATH AND CORONARY ANGIOGRAPHY N/A 12/22/2019   Procedure: RIGHT/LEFT HEART CATH AND CORONARY ANGIOGRAPHY;  Surgeon: Wonda Sharper, MD;  Location: [facility] INVASIVE CV LAB;  Service: Cardiovascular;  Laterality: N/A;   TEE WITHOUT CARDIOVERSION N/A 12/22/2019   Procedure: TRANSESOPHAGEAL ECHOCARDIOGRAM (TEE);  Surgeon: Barbaraann Darryle Ned, MD;  Location: [facility] ENDOSCOPY;  Service: Cardiovascular;  Laterality: N/A;   TEE WITHOUT CARDIOVERSION N/A 04/11/2020   Procedure: TRANSESOPHAGEAL ECHOCARDIOGRAM (TEE);  Surgeon: Dusty Sudie DEL, MD;  Location: [facility] OR;  Service: Open Heart Surgery;  Laterality: N/A;   TONSILLECTOMY     TOOTH EXTRACTION  03/07/2020   TYMPANOPLASTY  01/2016   Patient Active Problem List   Diagnosis Date Noted   Incisional hernia 05/14/2021   S/P minimally invasive mitral valve repair 04/11/2020   Mitral regurgitation 12/20/2019   VARICES OF OTHER SITES 12/30/2007   History of colonic polyps 12/25/2007    ONSET DATE: 12/09/2023 referral date; diagnosed with PD in 10/2022  REFERRING DIAG:  Diagnosis  G20.A1 (ICD-10-CM) - Parkinson's disease without dyskinesia or fluctuating manifestations (HCC)    THERAPY DIAG:  Parkinson's disease without dyskinesia or fluctuating manifestations (HCC)  Muscle weakness (  generalized)  Other lack of coordination  Other symptoms and signs involving the musculoskeletal system  Rationale for Evaluation and Treatment: Rehabilitation  SUBJECTIVE:   SUBJECTIVE STATEMENT: Pt reports no pain today and doing well.  Pt accompanied by: self  PERTINENT HISTORY: hx of mitral valve replacement, prostate cancer  PRECAUTIONS: Other: mitral valve repair with ring, fall, cancer hx, HTN  WEIGHT BEARING RESTRICTIONS: No  PAIN:  Are you having pain? No  FALLS: Has patient  fallen in last 6 months? Yes. Number of falls 1, treadmill  LIVING ENVIRONMENT: Lives with: lives with their spouse Lives in: House/apartment Stairs: stairs inside/outside, does have chair lift available if needed Has following equipment at home: Wheelchair (manual), shower chair, and Grab bars  PLOF: Independent  PATIENT GOALS: I want to maintain function as long as possible. Several years ago my strength kind of went away.   OBJECTIVE:  Note: Objective measures were completed at Evaluation unless otherwise noted.  HAND DOMINANCE: Right  ADLs: Overall ADLs: Reports independence but some tasks that require FM coordination takes longer Transfers/ambulation related to ADLs: I/Mod I Eating: Independent Grooming: Independent UB Dressing: Difficulty buttoning LB Dressing: Difficulty buttoning pants Toileting: Independent Bathing:  Mod Independent Tub Shower transfers: Mod Independent Equipment: Shower seat with back, Grab bars, and Walk in shower  IADLs: Shopping: Wife usually completes even before diagnosis Light housekeeping: Completing laundry and washes dishes  Meal Prep: wife completes cooking even before PD Community mobility: Driving Medication management: Completes Landscape architect: Independent Handwriting: 100% legible and Mild micrographia  MOBILITY STATUS: Independent and Hx of falls  POSTURE COMMENTS:  No Significant postural limitations  ACTIVITY TOLERANCE: Activity tolerance: Pt reports no changes  FUNCTIONAL OUTCOME MEASURES: Fastening/unfastening 3 buttons: 34.54 seconds Physical performance test: PPT#2 (simulated eating) 17.50 seconds & PPT#4 (donning/doffing jacket): 23.04 seconds    COORDINATION: 9 Hole Peg test: Right: 40.30 sec; Left: 38.43 sec Box and Blocks:  Right 32 blocks, Left 38 blocks (Did at first bring R hand around standing piece but corrected with verbal cue)  UE ROM:  WFL  UE MMT:   4/5 grossly  SENSATION:  WFL; however  reports occasional numbness in thenar eminence of R thumb, reports doctor said it's most likely d/t overuse. Was a engineer, civil (consulting) for work.  MUSCLE TONE: RUE: Within functional limits and LUE: Within functional limits  COGNITION: Overall cognitive status: Within functional limits for tasks assessed  OBSERVATIONS: decreased strength, decreased FM coordination especially in R dominant hand, difficulty with ADLs especially buttoning/zipping up jacket, pt reports micrographia when writing longer sentences as well                                                                                                                    TREATMENT DATE: 12/31/23  Administered PSFS, see Functional Outcomes for info. Pt educated in FM HEP to maintain coordination and dexterity in B hands for as long as possible. See pt instructions for detailed info.  Instructed pt in HEP with medium resistance  green theraband to improve BUE strength for maintaining independence with functional transfers and ADLs as long as possible. Urged to stop if any exercises cause pain, pt denied any pain. See Pt instructions for handout.  - Seated Shoulder Flexion with Self-Anchored Resistance  - 1 x daily - 4 x weekly - 3 sets - 10 reps - Seated Shoulder Horizontal Abduction with Resistance  - 1 x daily - 4 x weekly - 3 sets - 10 reps - Seated Elbow Flexion with Self-Anchored Resistance  - 1 x daily - 4 x weekly - 3 sets - 10 reps - Seated Elbow Extension with Self-Anchored Resistance  - 1 x daily - 4 x weekly - 3 sets - 10 reps   PATIENT EDUCATION: Education details: Coordination, strength HEP and safety with completion of HEP Person educated: Patient Education method: Handouts Education comprehension: verbalized understanding  HOME EXERCISE PROGRAM: 12/31/23 - Seated Shoulder Flexion with Self-Anchored Resistance  - 1 x daily - 4 x weekly - 3 sets - 10 reps - Seated Shoulder Horizontal Abduction with Resistance  - 1 x daily  - 4 x weekly - 3 sets - 10 reps - Seated Elbow Flexion with Self-Anchored Resistance  - 1 x daily - 4 x weekly - 3 sets - 10 reps - Seated Elbow Extension with Self-Anchored Resistance  - 1 x daily - 4 x weekly - 3 sets - 10 reps  GOALS: Goals reviewed with patient? Yes  SHORT TERM GOALS: Target date: 01/21/24  Pt will verbalize understanding of adaptive strategies to increase ease with ADLS/IADLS   Baseline: New to OP OT Goal status: INITIAL  2.  Pt will demonstrate improved ease with fastening buttons as evidenced by decreasing 3 button/unbutton time by at least 5 seconds  Baseline: 34.54 seconds Goal status: INITIAL  3.  Pt will demonstrate improved FM coordination and functional use of RUE by increasing Box and Blocks score by at least 3 blocks Baseline: Right 32 blocks, Left 38 blocks Goal status: INITIAL  4.  Pt will be independent with HEPs for UE strength and FM coordination with handouts for reference prn  Baseline: New to OP OT  Goal status: INITIAL  5. Pt will write short paragraph with 75% legibility and mild to no micrographia Baseline:  Wrote short sentence ([place]) with 100% legible and Mild micrographia. Pt reports micrographia worsens the longer he writes. Goal status: INITIAL  6. OT will assess pt PSFS and create goal as appropriate  Baseline: Not yet established  Goal status: INITIAL  LONG TERM GOALS: Target date: 02/18/24  Pt will write short paragraph with 100% legibility and mild to no micrographia Baseline:  Wrote short sentence ([place]) with 100% legible and Mild micrographia. Pt reports micrographia worsens the longer he writes. Goal status: INITIAL  2.  Pt will demonstrate improved fine motor coordination for ADLs as evidenced by decreasing 9 hole peg test score for R hand by at least 3 secs  Baseline: Right: 40.30 sec; Left: 38.43 sec Goal status: INITIAL  3.  Pt will demonstrate increased ease with  dressing as evidenced by decreasing PPT#4 (don/ doff jacket) to 20 secs or less  Baseline: PPT#4 (donning/doffing jacket): 23.04 seconds Goal status: INITIAL   4.  Pt will verbalize understanding of ways to prevent future PD related complications and PD community resources. Baseline: Initiated with PWR!Moves classes Goal status: INITIAL   5.  Pt will demonstrate improved FM coordination  and functional use of RUE by increasing Box and Blocks score by at least 6 blocks Baseline: Right 32 blocks, Left 38 blocks Goal status: INITIAL   ASSESSMENT:  CLINICAL IMPRESSION: Patient is a 73 y.o. male who was seen today for occupational therapy tx for PD management. Hx includes mitral valve replacement, prostate cancer, HTN. Patient presents to clinic seeking skilled therapy services to better manage occupational barriers secondary to Parkinson's and improve independence and safety with ADLs and IADLs. Would benefit from continued skilled services to continue to maintain quality of life and independence for as long as possible.    PERFORMANCE DEFICITS: in functional skills including ADLs, IADLs, coordination, dexterity, strength, Fine motor control, balance, cardiopulmonary status limiting function, and UE functional use, cognitive skills including safety awareness and sequencing, and psychosocial skills including coping strategies and environmental adaptation.   IMPAIRMENTS: are limiting patient from ADLs, IADLs, and leisure.   COMORBIDITIES:  has co-morbidities such as prostate cancer and mitral valve replacement hx that affects occupational performance. Patient will benefit from skilled OT to address above impairments and improve overall function.  MODIFICATION OR ASSISTANCE TO COMPLETE EVALUATION: Min-Moderate modification of tasks or assist with assess necessary to complete an evaluation.  OT OCCUPATIONAL PROFILE AND HISTORY: Detailed assessment: Review of records and additional review of  physical, cognitive, psychosocial history related to current functional performance.  CLINICAL DECISION MAKING: Moderate - several treatment options, min-mod task modification necessary  REHAB POTENTIAL: Good  EVALUATION COMPLEXITY: Moderate    PLAN:  OT FREQUENCY: 2x/week  OT DURATION: 8 weeks  PLANNED INTERVENTIONS: 97168 OT Re-evaluation, 97535 self care/ADL training, 02889 therapeutic exercise, 97530 therapeutic activity, 97112 neuromuscular re-education, 97140 manual therapy, passive range of motion, functional mobility training, energy conservation, coping strategies training, patient/family education, and DME and/or AE instructions  RECOMMENDED OTHER SERVICES: none noted   CONSULTED AND AGREED WITH PLAN OF CARE: Patient  PLAN FOR NEXT SESSION: Add onto theraband HEP prn Educate PWR!Hands Educate in AE for buttoning and opening lids and caps    Rocky Dutch, OT 12/31/2023, 9:33 AM

## 2023-12-31 NOTE — Therapy (Signed)
 OUTPATIENT PHYSICAL THERAPY NEURO TREATMENT NOTE   Patient Name: Tyler [name] MRN: [id number] DOB:[birth year], 73 y.o., male Today's Date: 12/31/2023   PCP: Janey Santos, MD REFERRING PROVIDER: Evonnie Asberry RAMAN, DO  END OF SESSION:  PT End of Session - 12/31/23 0929     Visit Number 5    Number of Visits 14    Date for Recertification  02/10/24    Authorization Type [employer]    PT Start Time 0930    PT Stop Time 1015    PT Time Calculation (min) 45 min    Equipment Utilized During Treatment Gait belt    Activity Tolerance Patient tolerated treatment well    Behavior During Therapy WFL for tasks assessed/performed            Past Medical History:  Diagnosis Date   Adenomatous polyp of colon 02/2004   Anemia    Anxiety    Arthritis    BPH (benign prostatic hyperplasia)    Colonic varices    Degenerative joint disease    Depression    Depression    Fatty liver    GERD (gastroesophageal reflux disease)    Heart murmur    Hemorrhoids    Hyperlipidemia    Hypertension    IBS (irritable bowel syndrome)    Prostate cancer (HCC) 2011   S/P minimally invasive mitral valve repair 04/11/2020   Complex valvuloplasty including artificial Gore-tex neochord placement x8 with suture plication of posterior commissure and 30 mm Sorin Memo 3D ring annuloplasty via right mini thoracotomy approach   Thrombocytopenia    Thyroid  nodule    Past Surgical History:  Procedure Laterality Date   BUBBLE STUDY  12/22/2019   Procedure: BUBBLE STUDY;  Surgeon: Barbaraann Darryle Ned, MD;  Location: [facility] ENDOSCOPY;  Service: Cardiovascular;;   CARDIAC CATHETERIZATION     COLONOSCOPY     MASS EXCISION Right 05/14/2021   Procedure: REPAIR RIGHT CHEST WALL HERNIA WITH MESH;  Surgeon: Kerrin Elspeth BROCKS, MD;  Location: [facility] OR;  Service: Thoracic;  Laterality: Right;   MITRAL VALVE REPAIR Right 04/11/2020   Procedure: MINIMALLY INVASIVE MITRAL VALVE REPAIR (MVR) USING MEMO 4D RING  SIZE ;  Surgeon: Dusty Sudie DEL, MD;  Location: [facility] OR;  Service: Open Heart Surgery;  Laterality: Right;   PROSTATECTOMY     RIGHT/LEFT HEART CATH AND CORONARY ANGIOGRAPHY N/A 12/22/2019   Procedure: RIGHT/LEFT HEART CATH AND CORONARY ANGIOGRAPHY;  Surgeon: Wonda Sharper, MD;  Location: [facility] INVASIVE CV LAB;  Service: Cardiovascular;  Laterality: N/A;   TEE WITHOUT CARDIOVERSION N/A 12/22/2019   Procedure: TRANSESOPHAGEAL ECHOCARDIOGRAM (TEE);  Surgeon: Barbaraann Darryle Ned, MD;  Location: [facility] ENDOSCOPY;  Service: Cardiovascular;  Laterality: N/A;   TEE WITHOUT CARDIOVERSION N/A 04/11/2020   Procedure: TRANSESOPHAGEAL ECHOCARDIOGRAM (TEE);  Surgeon: Dusty Sudie DEL, MD;  Location: [facility] OR;  Service: Open Heart Surgery;  Laterality: N/A;   TONSILLECTOMY     TOOTH EXTRACTION  03/07/2020   TYMPANOPLASTY  01/2016   Patient Active Problem List   Diagnosis Date Noted   Incisional hernia 05/14/2021   S/P minimally invasive mitral valve repair 04/11/2020   Mitral regurgitation 12/20/2019   VARICES OF OTHER SITES 12/30/2007   History of colonic polyps 12/25/2007    ONSET DATE: August 2024  REFERRING DIAG:  G20.A1 (ICD-10-CM) - Parkinson's disease without dyskinesia or fluctuating manifestations (HCC)    THERAPY DIAG:  Parkinson's disease without dyskinesia or fluctuating manifestations (HCC)  Muscle weakness (generalized)  Unsteadiness on feet  Other abnormalities of gait and mobility  Rationale for Evaluation and Treatment: Rehabilitation  SUBJECTIVE:                                                                                                                                                                                             SUBJECTIVE STATEMENT: Doing well Pt accompanied by: self  PERTINENT HISTORY: hx of mitral valve replacement, tendon problem in R shoulder (hx of this, tendon detached from shoulder), hx of L RTC and R RTC issues  PAIN:  Are you having pain?  No  PRECAUTIONS: None  RED FLAGS: None   WEIGHT BEARING RESTRICTIONS:   FALLS: Has patient fallen in last 6 months? one fall a couple weeks ago on his treadmill  LIVING ENVIRONMENT: Lives with: lives with their spouse Lives in: House/apartment Stairs: stairs inside/outside, does have chair lift available if needed Has following equipment at home: None  PLOF: Independent  Exercise routine at home:  treadmill at home x 30 minutes, light weights, and some balance exercises 3-4 days per week.  PATIENT GOALS: balance  OBJECTIVE:   TODAY'S TREATMENT: 12/31/23 Activity Comments  Lower body strength 2x10 -front squat 5# -sumo squat 5# -reverse lunge Education regarding PRE and relevant increase to resistance/reps  Standing PWR 1x10 Addition of red t-band for upper body                 TODAY'S TREATMENT: 12/29/23 Activity Comments  Standing PWR 1x10  4-square -canes, then canes w/ obstacles -6 hurdles, then w/ obstacles  High step suitcase carry 6 hurdles  Lateral step over 6 hurdles  Rocker board -lateral x 60 sec, then w/ head nods         TODAY'S TREATMENT: 12/24/2023 Activity Comments  NuStep, Level 3, 4 extremities x 6 minutes Varied speed intervals for effort/intensity (self-selected pace 75-80); >95-100 at increased intensity; rates as 7-8/10  Standing PWR! Moves: -PWR! Up x 10 -PWR! Rock 2 x 10, progressing to SLS -PWR! Twist x 10, reach across body -PWR! Step x 10 Visual and verbal cues; more focus on lower body and balance  Reviewed back step and weightshift Cues to hinge at hips for improved balance  Forward step and weightshift, x 10 reps each side   Standing on Airex: Feet apart/feet together with EO and EC 30 sec, then EO head turns/nods Mild sway   Short distance gait with cues to look ahead at targets  For improved posture and less veering with gait     Access Code: [email or web address] URL: [email or web address] Date: 12/17/2023,  12/24/2023 Prepared by: [facility] - Outpatient  Rehab - [facility]  *PWR MOVES STANDING, 10-20 reps 1x/day Exercises - Alternating Step Backward with Support  - 1 x daily - 7 x weekly - 1-2 sets - 10 reps - Goblet Squat with Kettlebell  - 2-3 x weekly - 3 sets - 10 reps - Sumo Squat with Dumbbell  - 2-3 x weekly - 3 sets - 10 reps - Reverse Lunge  - 2-3 x weekly - 3 sets - 10 reps  PATIENT EDUCATION: Education details: HEP additions for PWR! Moves standing Person educated: Patient Education method: Explanation, Demonstration, and Handouts Education comprehension: verbalized understanding, returned demonstration, and needs further education  ---------------------------------- Note: Objective measures were completed at Evaluation unless otherwise noted.  DIAGNOSTIC FINDINGS:   COGNITION: Overall cognitive status: Within functional limits for tasks assessed   SENSATION: WFL  COORDINATION: intact  EDEMA:  none  MUSCLE TONE: slight, 1+ bilateral biceps  MUSCLE LENGTH: WNL  DTRs:  NT  POSTURE: No Significant postural limitations  LOWER EXTREMITY ROM:     WNL  LOWER EXTREMITY MMT:    5/5  BED MOBILITY:  indep  TRANSFERS: indep    CURB:  Findings: indep  STAIRS: Not tested--reports independent but relies on HR for security GAIT: Findings: Comments: no deviations, decreased speed  FUNCTIONAL TESTS:  5 times sit to stand: 11 sec 10 meter walk test: 12.13 sec = 2.7 ft/sec Mini-BESTest: 22/28 : 5.3 sec                                                                                                                                TREATMENT DATE: 12/09/23    PATIENT EDUCATION: Education details: discussed relevant exercise intervention strategies/methods for tx of PD, initiated discussion of group classes in community, rationale of interventions Person educated: Patient and Spouse Education method: Explanation Education comprehension: needs  further education  HOME EXERCISE PROGRAM: TBD  GOALS: Goals reviewed with patient? Yes  SHORT TERM GOALS: Target date: 01/06/2024    Patient will be independent in HEP to improve functional outcomes Baseline: Goal status: IN PROGRESS  2.  Demo reduced risk for falls per time 4.8 sec 3-Meter Backwards Walk Test Baseline: 5.3 sec Goal status: IN PROGRESS  3.  Demo ability to sustain speed of 3.25 ft/sec during ambulation to improve community mobility Baseline: 2.7 ft/sec Goal status: IN PROGRESS   LONG TERM GOALS: Target date: 02/10/2024    Pt to verbalize understanding or participation in community-based classes for people with Parkinson's to improve activity carryover Baseline:  Goal status: IN PROGRESS  2.  Demo improved balance and reduced fall risk per score 25/28 Mini-BESTest Baseline: 22/28 Goal status: IN PROGRESS  3.  Patient to teach-back metrics for defining and executing high intensity training Baseline:  Goal status: IN PROGRESS   ASSESSMENT:  CLINICAL IMPRESSION: Instructed in lower body strengthening w/ emphasis on compound movements performing bilateral and unilateral movements to improve strength and coordination.  Education in relevant metrics  for PRE and progression.  Instructed in standing large amplitude movements with addition of resistance for postural endurance/strength and for improved feedback for upper body movements. Continued sessions to progress POC details and education  OBJECTIVE IMPAIRMENTS: decreased activity tolerance, decreased balance, decreased endurance, decreased knowledge of condition, difficulty walking, and impaired tone.   ACTIVITY LIMITATIONS: carrying, lifting, stairs, and locomotion level  PARTICIPATION LIMITATIONS: community activity, yard work, and exercise routine  PERSONAL FACTORS: Age, Time since onset of injury/illness/exacerbation, and 1 comorbidity: PMH are also affecting patient's functional outcome.   REHAB  POTENTIAL: Excellent  CLINICAL DECISION MAKING: Evolving/moderate complexity  EVALUATION COMPLEXITY: Moderate  PLAN:  PT FREQUENCY: 2x/wk x 4 wks, then 1x/wk x 4 wks  PT DURATION: 8 weeks  PLANNED INTERVENTIONS: 97750- Physical Performance Testing, 97110-Therapeutic exercises, 97530- Therapeutic activity, [id number]- Neuromuscular re-education, 97535- Self Care, [id number]- Manual therapy, [id number]- Gait training, [phone]- Canalith repositioning, [id number]- Aquatic Therapy, and Patient/Family education  PLAN FOR NEXT SESSION: Review PWR! Moves with more focus on lower body and less intense focus on upper body (due to pt's hx of shoulder issues).  Work on step strategy varied directions, compliant surface balance, high intensity interval training, gait activities and relevant metrics for self-monitoring  9:29 AM, 12/31/23 M. Kelly Shelden Raborn, PT, DPT Physical Therapist- [place] Office Number: [phone]

## 2024-01-05 ENCOUNTER — Ambulatory Visit

## 2024-01-05 ENCOUNTER — Ambulatory Visit: Admitting: Occupational Therapy

## 2024-01-05 ENCOUNTER — Ambulatory Visit: Attending: Neurology

## 2024-01-05 DIAGNOSIS — R278 Other lack of coordination: Secondary | ICD-10-CM | POA: Diagnosis not present

## 2024-01-05 DIAGNOSIS — M6281 Muscle weakness (generalized): Secondary | ICD-10-CM | POA: Diagnosis not present

## 2024-01-05 DIAGNOSIS — R2689 Other abnormalities of gait and mobility: Secondary | ICD-10-CM | POA: Diagnosis not present

## 2024-01-05 DIAGNOSIS — R1312 Dysphagia, oropharyngeal phase: Secondary | ICD-10-CM | POA: Insufficient documentation

## 2024-01-05 DIAGNOSIS — G20A1 Parkinson's disease without dyskinesia, without mention of fluctuations: Secondary | ICD-10-CM | POA: Insufficient documentation

## 2024-01-05 DIAGNOSIS — R471 Dysarthria and anarthria: Secondary | ICD-10-CM

## 2024-01-05 DIAGNOSIS — R29898 Other symptoms and signs involving the musculoskeletal system: Secondary | ICD-10-CM | POA: Insufficient documentation

## 2024-01-05 DIAGNOSIS — R2681 Unsteadiness on feet: Secondary | ICD-10-CM | POA: Insufficient documentation

## 2024-01-05 NOTE — Patient Instructions (Addendum)
 Coordination Exercises  Perform the following exercises for 10-15 minutes 1 times per day. Perform with both hand(s). Perform using big movements.  Flipping Cards: Place deck of cards on the table. Flip cards over by opening your hand big to grasp and then turn your palm up big. Deal cards: Hold 1/2 or whole deck in your hand. Use thumb to push card off top of deck with one big push. Rotate ball with fingertips: Pick up with fingers/thumb and move as much as you can with each turn/movement (clockwise and counter-clockwise). Toss ball from one hand to the other: Toss big/high. Toss ball in the air and catch with the same hand: Toss big/high. Pick up coins and place in coin bank or container: Pick up with big, intentional movements. Do not drag coin to the edge. Pick up coins and stack one at a time: Pick up with big, intentional movements. Do not drag coin to the edge. (5-10 in a stack) Pick up 5-10 coins one at a time and hold in palm. Then, move coins from palm to fingertips one at time and place in coin bank/container. Fasten nuts/bolts or put on bottle caps: Turn as much/as big as you can with each turn.

## 2024-01-05 NOTE — Therapy (Signed)
 OUTPATIENT PHYSICAL THERAPY NEURO TREATMENT NOTE   Patient Name: NEVAN CREIGHTON MRN: [id number] DOB:1950/03/29, 73 y.o., male Today's Date: 01/05/2024   PCP: Janey Santos, MD REFERRING PROVIDER: Evonnie Asberry RAMAN, DO  END OF SESSION:  PT End of Session - 01/05/24 0933     Visit Number 6    Number of Visits 14    Date for Recertification  02/10/24    Authorization Type [employer]    PT Start Time 0930    PT Stop Time 1015    PT Time Calculation (min) 45 min    Equipment Utilized During Treatment Gait belt    Activity Tolerance Patient tolerated treatment well    Behavior During Therapy WFL for tasks assessed/performed            Past Medical History:  Diagnosis Date   Adenomatous polyp of colon 02/2004   Anemia    Anxiety    Arthritis    BPH (benign prostatic hyperplasia)    Colonic varices    Degenerative joint disease    Depression    Depression    Fatty liver    GERD (gastroesophageal reflux disease)    Heart murmur    Hemorrhoids    Hyperlipidemia    Hypertension    IBS (irritable bowel syndrome)    Prostate cancer (HCC) 2011   S/P minimally invasive mitral valve repair 04/11/2020   Complex valvuloplasty including artificial Gore-tex neochord placement x8 with suture plication of posterior commissure and 30 mm Sorin Memo 3D ring annuloplasty via right mini thoracotomy approach   Thrombocytopenia    Thyroid  nodule    Past Surgical History:  Procedure Laterality Date   BUBBLE STUDY  12/22/2019   Procedure: BUBBLE STUDY;  Surgeon: Barbaraann Darryle Ned, MD;  Location: [facility] ENDOSCOPY;  Service: Cardiovascular;;   CARDIAC CATHETERIZATION     COLONOSCOPY     MASS EXCISION Right 05/14/2021   Procedure: REPAIR RIGHT CHEST WALL HERNIA WITH MESH;  Surgeon: Kerrin Elspeth BROCKS, MD;  Location: [facility] OR;  Service: Thoracic;  Laterality: Right;   MITRAL VALVE REPAIR Right 04/11/2020   Procedure: MINIMALLY INVASIVE MITRAL VALVE REPAIR (MVR) USING MEMO 4D RING  SIZE ;  Surgeon: Dusty Sudie DEL, MD;  Location: [facility] OR;  Service: Open Heart Surgery;  Laterality: Right;   PROSTATECTOMY     RIGHT/LEFT HEART CATH AND CORONARY ANGIOGRAPHY N/A 12/22/2019   Procedure: RIGHT/LEFT HEART CATH AND CORONARY ANGIOGRAPHY;  Surgeon: Wonda Sharper, MD;  Location: [facility] INVASIVE CV LAB;  Service: Cardiovascular;  Laterality: N/A;   TEE WITHOUT CARDIOVERSION N/A 12/22/2019   Procedure: TRANSESOPHAGEAL ECHOCARDIOGRAM (TEE);  Surgeon: Barbaraann Darryle Ned, MD;  Location: [facility] ENDOSCOPY;  Service: Cardiovascular;  Laterality: N/A;   TEE WITHOUT CARDIOVERSION N/A 04/11/2020   Procedure: TRANSESOPHAGEAL ECHOCARDIOGRAM (TEE);  Surgeon: Dusty Sudie DEL, MD;  Location: [facility] OR;  Service: Open Heart Surgery;  Laterality: N/A;   TONSILLECTOMY     TOOTH EXTRACTION  03/07/2020   TYMPANOPLASTY  01/2016   Patient Active Problem List   Diagnosis Date Noted   Incisional hernia 05/14/2021   S/P minimally invasive mitral valve repair 04/11/2020   Mitral regurgitation 12/20/2019   VARICES OF OTHER SITES 12/30/2007   History of colonic polyps 12/25/2007    ONSET DATE: August 2024  REFERRING DIAG:  G20.A1 (ICD-10-CM) - Parkinson's disease without dyskinesia or fluctuating manifestations (HCC)    THERAPY DIAG:  Parkinson's disease without dyskinesia or fluctuating manifestations (HCC)  Muscle weakness (generalized)  Other symptoms and signs  involving the musculoskeletal system  Unsteadiness on feet  Other abnormalities of gait and mobility  Rationale for Evaluation and Treatment: Rehabilitation  SUBJECTIVE:                                                                                                                                                                                             SUBJECTIVE STATEMENT: Was a little bit sore following the leg exercise Pt accompanied by: self  PERTINENT HISTORY: hx of mitral valve replacement, tendon problem in R shoulder (hx  of this, tendon detached from shoulder), hx of L RTC and R RTC issues  PAIN:  Are you having pain? No  PRECAUTIONS: None  RED FLAGS: None   WEIGHT BEARING RESTRICTIONS:   FALLS: Has patient fallen in last 6 months? one fall a couple weeks ago on his treadmill  LIVING ENVIRONMENT: Lives with: lives with their spouse Lives in: House/apartment Stairs: stairs inside/outside, does have chair lift available if needed Has following equipment at home: None  PLOF: Independent  Exercise routine at home:  treadmill at home x 30 minutes, light weights, and some balance exercises 3-4 days per week.  PATIENT GOALS: balance  OBJECTIVE:   TODAY'S TREATMENT: 01/05/24 Activity Comments  LE PRE review Good form, no added weight   Suitcase march 4x25 ft 15#  Sumo squat 1x10, 25# Suitcase deadlift 1x5 25# Bent over row 1x10, 25#   Static multisensory balance            TODAY'S TREATMENT: 12/31/23 Activity Comments  Lower body strength 2x10 -front squat 5# -sumo squat 5# -reverse lunge Education regarding PRE and relevant increase to resistance/reps  Standing PWR 1x10 Addition of red t-band for upper body                      Access Code: [email or web address] URL: [email or web address] Date: 12/17/2023, 12/24/2023 Prepared by: [facility] - Outpatient  Rehab - [facility]  *PWR MOVES STANDING, 10-20 reps 1x/day Exercises - Alternating Step Backward with Support  - 1 x daily - 7 x weekly - 1-2 sets - 10 reps - Goblet Squat with Kettlebell  - 2-3 x weekly - 3 sets - 10 reps - Sumo Squat with Dumbbell  - 2-3 x weekly - 3 sets - 10 reps - Reverse Lunge  - 2-3 x weekly - 3 sets - 10 reps - Kettlebell Suitcase Carry  - 2-3 x weekly - Standing Bent Over Single Arm Shoulder Row  - 2-3 x weekly - 3 sets - 10 reps  PATIENT EDUCATION: Education details: HEP additions for PWR! Moves standing Person educated:  Patient Education method: Explanation, Demonstration, and  Handouts Education comprehension: verbalized understanding, returned demonstration, and needs further education  ---------------------------------- Note: Objective measures were completed at Evaluation unless otherwise noted.  DIAGNOSTIC FINDINGS:   COGNITION: Overall cognitive status: Within functional limits for tasks assessed   SENSATION: WFL  COORDINATION: intact  EDEMA:  none  MUSCLE TONE: slight, 1+ bilateral biceps  MUSCLE LENGTH: WNL  DTRs:  NT  POSTURE: No Significant postural limitations  LOWER EXTREMITY ROM:     WNL  LOWER EXTREMITY MMT:    5/5  BED MOBILITY:  indep  TRANSFERS: indep    CURB:  Findings: indep  STAIRS: Not tested--reports independent but relies on HR for security GAIT: Findings: Comments: no deviations, decreased speed  FUNCTIONAL TESTS:  5 times sit to stand: 11 sec 10 meter walk test: 12.13 sec = 2.7 ft/sec Mini-BESTest: 22/28 : 5.3 sec                                                                                                                                TREATMENT DATE: 12/09/23    PATIENT EDUCATION: Education details: discussed relevant exercise intervention strategies/methods for tx of PD, initiated discussion of group classes in community, rationale of interventions Person educated: Patient and Spouse Education method: Explanation Education comprehension: needs further education  HOME EXERCISE PROGRAM: TBD  GOALS: Goals reviewed with patient? Yes  SHORT TERM GOALS: Target date: 01/06/2024    Patient will be independent in HEP to improve functional outcomes Baseline: Goal status: IN PROGRESS  2.  Demo reduced risk for falls per time 4.8 sec 3-Meter Backwards Walk Test Baseline: 5.3 sec Goal status: IN PROGRESS  3.  Demo ability to sustain speed of 3.25 ft/sec during ambulation to improve community mobility Baseline: 2.7 ft/sec Goal status: IN PROGRESS   LONG TERM GOALS: Target date:  02/10/2024    Pt to verbalize understanding or participation in community-based classes for people with Parkinson's to improve activity carryover Baseline:  Goal status: IN PROGRESS  2.  Demo improved balance and reduced fall risk per score 25/28 Mini-BESTest Baseline: 22/28 Goal status: IN PROGRESS  3.  Patient to teach-back metrics for defining and executing high intensity training Baseline:  Goal status: IN PROGRESS   ASSESSMENT:  CLINICAL IMPRESSION: Review to HEP activities for LE strength with good return demo with brief verbal cue for correction to form on compound movements. Progressed balance/strength activities to improve core strength and balance followed by static balance activities for postural awareness.  Difficulty with single leg stance on left and difficulty with multisensory balance with tendency for anterior LOB. Continued sessions to progess HEP development and coordination/balance activities  OBJECTIVE IMPAIRMENTS: decreased activity tolerance, decreased balance, decreased endurance, decreased knowledge of condition, difficulty walking, and impaired tone.   ACTIVITY LIMITATIONS: carrying, lifting, stairs, and locomotion level  PARTICIPATION LIMITATIONS: community activity, yard work, and exercise routine  PERSONAL FACTORS: Age, Time since onset of injury/illness/exacerbation,  and 1 comorbidity: PMH are also affecting patient's functional outcome.   REHAB POTENTIAL: Excellent  CLINICAL DECISION MAKING: Evolving/moderate complexity  EVALUATION COMPLEXITY: Moderate  PLAN:  PT FREQUENCY: 2x/wk x 4 wks, then 1x/wk x 4 wks  PT DURATION: 8 weeks  PLANNED INTERVENTIONS: 97750- Physical Performance Testing, 97110-Therapeutic exercises, 97530- Therapeutic activity, [id number]- Neuromuscular re-education, 97535- Self Care, 02859- Manual therapy, [id number]- Gait training, [phone]- Canalith repositioning, [id number]- Aquatic Therapy, and Patient/Family education  PLAN FOR NEXT  SESSION: Check STG. Review PWR! Moves with more focus on lower body and less intense focus on upper body (due to pt's hx of shoulder issues).  Work on step strategy varied directions, compliant surface balance, high intensity interval training, gait activities and relevant metrics for self-monitoring  9:34 AM, 01/05/24 M. Kelly Jamaris Biernat, PT, DPT Physical Therapist- [place] Office Number: [phone]

## 2024-01-05 NOTE — Patient Instructions (Signed)
   SWALLOWING EXERCISES Perform these for 8 weeks, at least 5 days/week  Effortful Swallows - Press your tongue hard against the roof of your mouth, then squeeze hard with the muscles in your neck while you swallow your saliva or a sip of water - Repeat 30 times a day   [place] Swallow - swallow with your tongue sticking out - Stick tongue out and gently bite tongue with your teeth - Swallow, while holding your tongue with your teeth - Repeat 30 times a day   M[place]elsohn Maneuver -  swallow as tight as you  for 5 seconds - Start to swallow, and keep your Adam's apple up by squeezing tight with the muscles of the throat - Hold the squeeze for 5-7 seconds and then relax - Repeat 30 times a day

## 2024-01-05 NOTE — Therapy (Signed)
 OUTPATIENT OCCUPATIONAL THERAPY [facility]  Patient Name: Tyler [name] MRN: [id number] DOB:1950/04/21, 73 y.o., male Today's Date: 01/05/2024  PCP: Janey Santos REFERRING PROVIDER: Evonnie Asberry RAMAN, DO  END OF SESSION:   OT End of Session - 01/05/24 1017     Visit Number 3    Number of Visits 17    Date for Recertification  02/19/24    Authorization Type [employer]    OT Start Time 1016    OT Stop Time 1058    OT Time Calculation (min) 42 min    Equipment Utilized During Treatment green theraband, FM coordination items    Activity Tolerance Patient tolerated treatment well    Behavior During Therapy WFL for tasks assessed/performed           Past Medical History:  Diagnosis Date   Adenomatous polyp of colon 02/2004   Anemia    Anxiety    Arthritis    BPH (benign prostatic hyperplasia)    Colonic varices    Degenerative joint disease    Depression    Depression    Fatty liver    GERD (gastroesophageal reflux disease)    Heart murmur    Hemorrhoids    Hyperlipidemia    Hypertension    IBS (irritable bowel syndrome)    Prostate cancer (HCC) 2011   S/P minimally invasive mitral valve repair 04/11/2020   Complex valvuloplasty including artificial Gore-tex neochord placement x8 with suture plication of posterior commissure and 30 mm Sorin Memo 3D ring annuloplasty via right mini thoracotomy approach   Thrombocytopenia    Thyroid  nodule    Past Surgical History:  Procedure Laterality Date   BUBBLE STUDY  12/22/2019   Procedure: BUBBLE STUDY;  Surgeon: Barbaraann Darryle Ned, MD;  Location: [facility] ENDOSCOPY;  Service: Cardiovascular;;   CARDIAC CATHETERIZATION     COLONOSCOPY     MASS EXCISION Right 05/14/2021   Procedure: REPAIR RIGHT CHEST WALL HERNIA WITH MESH;  Surgeon: Kerrin Elspeth BROCKS, MD;  Location: [facility] OR;  Service: Thoracic;  Laterality: Right;   MITRAL VALVE REPAIR Right 04/11/2020   Procedure: MINIMALLY INVASIVE MITRAL VALVE REPAIR  (MVR) USING MEMO 4D RING SIZE ;  Surgeon: Dusty Sudie DEL, MD;  Location: [facility] OR;  Service: Open Heart Surgery;  Laterality: Right;   PROSTATECTOMY     RIGHT/LEFT HEART CATH AND CORONARY ANGIOGRAPHY N/A 12/22/2019   Procedure: RIGHT/LEFT HEART CATH AND CORONARY ANGIOGRAPHY;  Surgeon: Wonda Sharper, MD;  Location: [facility] INVASIVE CV LAB;  Service: Cardiovascular;  Laterality: N/A;   TEE WITHOUT CARDIOVERSION N/A 12/22/2019   Procedure: TRANSESOPHAGEAL ECHOCARDIOGRAM (TEE);  Surgeon: Barbaraann Darryle Ned, MD;  Location: [facility] ENDOSCOPY;  Service: Cardiovascular;  Laterality: N/A;   TEE WITHOUT CARDIOVERSION N/A 04/11/2020   Procedure: TRANSESOPHAGEAL ECHOCARDIOGRAM (TEE);  Surgeon: Dusty Sudie DEL, MD;  Location: [facility] OR;  Service: Open Heart Surgery;  Laterality: N/A;   TONSILLECTOMY     TOOTH EXTRACTION  03/07/2020   TYMPANOPLASTY  01/2016   Patient Active Problem List   Diagnosis Date Noted   Incisional hernia 05/14/2021   S/P minimally invasive mitral valve repair 04/11/2020   Mitral regurgitation 12/20/2019   VARICES OF OTHER SITES 12/30/2007   History of colonic polyps 12/25/2007    ONSET DATE: 12/09/2023 referral date; diagnosed with PD in 10/2022  REFERRING DIAG:  Diagnosis  G20.A1 (ICD-10-CM) - Parkinson's disease without dyskinesia or fluctuating manifestations (HCC)    THERAPY DIAG:  Other lack of coordination  Unsteadiness on feet  Rationale  for Evaluation and Treatment: Rehabilitation  SUBJECTIVE:   SUBJECTIVE STATEMENT: Pt reports no pain today and doing well.   Pt accompanied by: self  PERTINENT HISTORY: hx of mitral valve replacement, prostate cancer  PRECAUTIONS: Other: mitral valve repair with ring, fall, cancer hx, HTN  WEIGHT BEARING RESTRICTIONS: No  PAIN:  Are you having pain? No  FALLS: Has patient fallen in last 6 months? Yes. Number of falls 1, treadmill  LIVING ENVIRONMENT: Lives with: lives with their spouse Lives in:  House/apartment Stairs: stairs inside/outside, does have chair lift available if needed Has following equipment at home: Wheelchair (manual), shower chair, and Grab bars  PLOF: Independent  PATIENT GOALS: I want to maintain function as long as possible. Several years ago my strength kind of went away.   OBJECTIVE:  Note: Objective measures were completed at Evaluation unless otherwise noted.  HAND DOMINANCE: Right  ADLs: Overall ADLs: Reports independence but some tasks that require FM coordination takes longer Transfers/ambulation related to ADLs: I/Mod I Eating: Independent Grooming: Independent UB Dressing: Difficulty buttoning LB Dressing: Difficulty buttoning pants Toileting: Independent Bathing:  Mod Independent Tub Shower transfers: Mod Independent Equipment: Shower seat with back, Grab bars, and Walk in shower  IADLs: Shopping: Wife usually completes even before diagnosis Light housekeeping: Completing laundry and washes dishes  Meal Prep: wife completes cooking even before PD Community mobility: Driving Medication management: Completes Landscape architect: Independent Handwriting: 100% legible and Mild micrographia  MOBILITY STATUS: Independent and Hx of falls  POSTURE COMMENTS:  No Significant postural limitations  ACTIVITY TOLERANCE: Activity tolerance: Pt reports no changes  FUNCTIONAL OUTCOME MEASURES: Fastening/unfastening 3 buttons: 34.54 seconds Physical performance test: PPT#2 (simulated eating) 17.50 seconds & PPT#4 (donning/doffing jacket): 23.04 seconds    COORDINATION: 9 Hole Peg test: Right: 40.30 sec; Left: 38.43 sec Box and Blocks:  Right 32 blocks, Left 38 blocks (Did at first bring R hand around standing piece but corrected with verbal cue)  UE ROM:  WFL  UE MMT:   4/5 grossly  SENSATION:  WFL; however reports occasional numbness in thenar eminence of R thumb, reports doctor said it's most likely d/t overuse. Was a cabin crew for work.  MUSCLE TONE: RUE: Within functional limits and LUE: Within functional limits  COGNITION: Overall cognitive status: Within functional limits for tasks assessed  OBSERVATIONS: decreased strength, decreased FM coordination especially in R dominant hand, difficulty with ADLs especially buttoning/zipping up jacket, pt reports micrographia when writing longer sentences as well                                                                                                                    TREATMENT DATE:  01/05/24 Large amplitude (PWR Hands): Educated pt in [place]!Hands exercises with min cues for BIG movements. OT providing demonstration with verbal cues for amplitude.  Handout provided. Coordination: with each UE, including: rotating small ball w/ fingertips, tossing/catching ball w/ ipsilateral UE, tossing/catching ball R <> L UE, picking up coins and placing into container, picking  up coins and stacking/unstacking them, and picking up 5-10 coins 1 at a time and translating palm to fingertips to place in a stack    12/31/23  Administered PSFS, see Functional Outcomes for info. Pt educated in FM HEP to maintain coordination and dexterity in B hands for as long as possible. See pt instructions for detailed info.  Instructed pt in HEP with medium resistance green theraband to improve BUE strength for maintaining independence with functional transfers and ADLs as long as possible. Urged to stop if any exercises cause pain, pt denied any pain. See Pt instructions for handout.  - Seated Shoulder Flexion with Self-Anchored Resistance  - 1 x daily - 4 x weekly - 3 sets - 10 reps - Seated Shoulder Horizontal Abduction with Resistance  - 1 x daily - 4 x weekly - 3 sets - 10 reps - Seated Elbow Flexion with Self-Anchored Resistance  - 1 x daily - 4 x weekly - 3 sets - 10 reps - Seated Elbow Extension with Self-Anchored Resistance  - 1 x daily - 4 x weekly - 3 sets - 10 reps   PATIENT  EDUCATION: Education details: Coordination, strength HEP and safety with completion of HEP Person educated: Patient Education method: Handouts Education comprehension: verbalized understanding  HOME EXERCISE PROGRAM: 12/31/23 - Seated Shoulder Flexion with Self-Anchored Resistance  - 1 x daily - 4 x weekly - 3 sets - 10 reps - Seated Shoulder Horizontal Abduction with Resistance  - 1 x daily - 4 x weekly - 3 sets - 10 reps - Seated Elbow Flexion with Self-Anchored Resistance  - 1 x daily - 4 x weekly - 3 sets - 10 reps - Seated Elbow Extension with Self-Anchored Resistance  - 1 x daily - 4 x weekly - 3 sets - 10 reps  GOALS: Goals reviewed with patient? Yes  SHORT TERM GOALS: Target date: 01/21/24  Pt will verbalize understanding of adaptive strategies to increase ease with ADLS/IADLS   Baseline: New to OP OT Goal status: INITIAL  2.  Pt will demonstrate improved ease with fastening buttons as evidenced by decreasing 3 button/unbutton time by at least 5 seconds  Baseline: 34.54 seconds Goal status: INITIAL  3.  Pt will demonstrate improved FM coordination and functional use of RUE by increasing Box and Blocks score by at least 3 blocks Baseline: Right 32 blocks, Left 38 blocks Goal status: INITIAL  4.  Pt will be independent with HEPs for UE strength and FM coordination with handouts for reference prn  Baseline: New to OP OT  Goal status: INITIAL  5. Pt will write short paragraph with 75% legibility and mild to no micrographia Baseline:  Wrote short sentence ([place]) with 100% legible and Mild micrographia. Pt reports micrographia worsens the longer he writes. Goal status: INITIAL  6. OT will assess pt PSFS and create goal as appropriate  Baseline: Not yet established  Goal status: INITIAL  LONG TERM GOALS: Target date: 02/18/24  Pt will write short paragraph with 100% legibility and mild to no micrographia Baseline:  Wrote short sentence ([place]) with 100% legible and Mild micrographia. Pt reports micrographia worsens the longer he writes. Goal status: INITIAL  2.  Pt will demonstrate improved fine motor coordination for ADLs as evidenced by decreasing 9 hole peg test score for R hand by at least 3 secs  Baseline: Right: 40.30 sec; Left: 38.43 sec Goal status: INITIAL  3.  Pt will demonstrate increased ease with dressing as evidenced by decreasing PPT#4 (don/ doff jacket) to 20 secs or less  Baseline: PPT#4 (donning/doffing jacket): 23.04 seconds Goal status: INITIAL   4.  Pt will verbalize understanding of ways to prevent future PD related complications and PD community resources. Baseline: Initiated with PWR!Moves classes Goal status: INITIAL   5.  Pt will demonstrate improved FM coordination and functional use of RUE by increasing Box and Blocks score by at least 6 blocks Baseline: Right 32 blocks, Left 38 blocks Goal status: INITIAL   ASSESSMENT:  CLINICAL IMPRESSION: Patient is a 73 y.o. male who was seen today for occupational therapy tx for PD management. Pt demonstrating mild increased difficulty with coordination tasks with RUE compared to LUE.  Pt responding well to demonstration of large amplitude and coordination tasks.  Pt receptive to education on breaking exercises into chunks to increase carryover to home.  Patient will continue to benefit from skilled therapy services to better manage occupational barriers secondary to Parkinson's and improve independence and safety with ADLs and IADLs.   PERFORMANCE DEFICITS: in functional skills including ADLs, IADLs, coordination, dexterity, strength, Fine motor control, balance, cardiopulmonary status limiting function, and UE functional use, cognitive skills including safety awareness and sequencing, and psychosocial skills including coping strategies and environmental adaptation.   IMPAIRMENTS: are limiting patient from ADLs, IADLs, and leisure.      PLAN:  OT FREQUENCY: 2x/week  OT DURATION: 8 weeks  PLANNED INTERVENTIONS: 97168 OT Re-evaluation, 97535 self care/ADL training, 02889 therapeutic exercise, 97530 therapeutic activity, 97112 neuromuscular re-education, 97140 manual therapy, passive range of motion, functional mobility training, energy conservation, coping strategies training, patient/family education, and DME and/or AE instructions  RECOMMENDED OTHER SERVICES: none noted   CONSULTED AND AGREED WITH PLAN OF CARE: Patient  PLAN FOR NEXT SESSION: Add onto theraband HEP prn Review PWR!Hands Educate in AE for buttoning and opening lids and caps  Start with card portion of coordination handout (see pt instructions from 01/05/24)    KAYLENE DOMINO, OT 01/05/2024, 11:04 AM

## 2024-01-05 NOTE — Therapy (Signed)
 OUTPATIENT SPEECH LANGUAGE PATHOLOGY [facility]   Patient Name: Tyler [name] MRN: [id number] DOB:28-Jan-1951, 73 y.o., male Today's Date: 01/05/2024  PCP: Janey Santos, MD REFERRING PROVIDER: Evonnie Stabs, DO  END OF SESSION:  End of Session - 01/05/24 2241     Visit Number 3    Number of Visits 17    Date for Recertification  02/06/24    SLP Start Time 0853    SLP Stop Time  0932    SLP Time Calculation (min) 39 min    Activity Tolerance Patient tolerated treatment well            Past Medical History:  Diagnosis Date   Adenomatous polyp of colon 02/2004   Anemia    Anxiety    Arthritis    BPH (benign prostatic hyperplasia)    Colonic varices    Degenerative joint disease    Depression    Depression    Fatty liver    GERD (gastroesophageal reflux disease)    Heart murmur    Hemorrhoids    Hyperlipidemia    Hypertension    IBS (irritable bowel syndrome)    Prostate cancer (HCC) 2011   S/P minimally invasive mitral valve repair 04/11/2020   Complex valvuloplasty including artificial Gore-tex neochord placement x8 with suture plication of posterior commissure and 30 mm Sorin Memo 3D ring annuloplasty via right mini thoracotomy approach   Thrombocytopenia    Thyroid  nodule    Past Surgical History:  Procedure Laterality Date   BUBBLE STUDY  12/22/2019   Procedure: BUBBLE STUDY;  Surgeon: Barbaraann Darryle Ned, MD;  Location: [facility] ENDOSCOPY;  Service: Cardiovascular;;   CARDIAC CATHETERIZATION     COLONOSCOPY     MASS EXCISION Right 05/14/2021   Procedure: REPAIR RIGHT CHEST WALL HERNIA WITH MESH;  Surgeon: Kerrin Elspeth BROCKS, MD;  Location: [facility] OR;  Service: Thoracic;  Laterality: Right;   MITRAL VALVE REPAIR Right 04/11/2020   Procedure: MINIMALLY INVASIVE MITRAL VALVE REPAIR (MVR) USING MEMO 4D RING SIZE ;  Surgeon: Dusty Sudie DEL, MD;  Location: [facility] OR;  Service: Open Heart Surgery;  Laterality: Right;   PROSTATECTOMY     RIGHT/LEFT HEART  CATH AND CORONARY ANGIOGRAPHY N/A 12/22/2019   Procedure: RIGHT/LEFT HEART CATH AND CORONARY ANGIOGRAPHY;  Surgeon: Wonda Sharper, MD;  Location: [facility] INVASIVE CV LAB;  Service: Cardiovascular;  Laterality: N/A;   TEE WITHOUT CARDIOVERSION N/A 12/22/2019   Procedure: TRANSESOPHAGEAL ECHOCARDIOGRAM (TEE);  Surgeon: Barbaraann Darryle Ned, MD;  Location: [facility] ENDOSCOPY;  Service: Cardiovascular;  Laterality: N/A;   TEE WITHOUT CARDIOVERSION N/A 04/11/2020   Procedure: TRANSESOPHAGEAL ECHOCARDIOGRAM (TEE);  Surgeon: Dusty Sudie DEL, MD;  Location: [facility] OR;  Service: Open Heart Surgery;  Laterality: N/A;   TONSILLECTOMY     TOOTH EXTRACTION  03/07/2020   TYMPANOPLASTY  01/2016   Patient Active Problem List   Diagnosis Date Noted   Incisional hernia 05/14/2021   S/P minimally invasive mitral valve repair 04/11/2020   Mitral regurgitation 12/20/2019   VARICES OF OTHER SITES 12/30/2007   History of colonic polyps 12/25/2007    ONSET DATE: diagnosed one year ago; script 11/06/23  REFERRING DIAG: Hypophonia  THERAPY DIAG:  Dysarthria and anarthria  Dysphagia, oropharyngeal phase  Rationale for Evaluation and Treatment: Rehabilitation  SUBJECTIVE:   SUBJECTIVE STATEMENT: Not all of it went down.  Pt accompanied by: self  PERTINENT HISTORY: See above. Pt dx'd with PD August 2024 with sx prior to that.  PAIN:  Are you having pain?  Yes: NPRS scale: 3/10 Pain location: abdominal Pain description: cramp/ache Aggravating factors: IDK Relieving factors: IDK  FALLS: Has patient fallen in last 6 months?  See PT evaluation for details   PATIENT GOALS: Improve speech and swallowing  OBJECTIVE:  Note: Objective measures were completed at Evaluation unless otherwise noted.  MBS 12/22/23: Pt demonstrates a mild oropharyngeal dysphagia consisting of good airway protection, but poor oral efficiency and mild pharyngeal residue. Pt recommended to continue regular diet and thin liquids with f/u  with OP SLP for appropriate strategies.    Pt has inefficient oral phase with unnecessary lingual movement to gather bolus and initiate swallow as well a piecemeal transit and loss of bolus to floor of mouth and buccal cavity. When cued to do so pt can increase efficency and improve bolus cohesion to decrease oral residue and reduce number of swallows gather it and swallow it all at once. Pt also has mild pharyngeal residue with thin liquids, likely due to oral deficits and slightly decreased ROM and duration of swallow. Again with cues for an effortful swallow pt increases these measures and reduces residue. Pt did not aspirate during the study, but expect some penetration or aspiration of oral residue and pharyngeal residue pooling after bites and sips. Also noted bumpy posterior pharyngeal wall and esophageal column against cervical spine, suggesting loss of tissue mass with age/sarcopenia. Pt to f/u with OP SLP to implement strategies. Would not suggest any texture changes.   DIGEST Swallow Severity Rating*             Safety: 0             Efficiency:1             Overall Pharyngeal Swallow Severity: 1 1: mild; 2: moderate; 3: severe; 4: profound  *The Dynamic Imaging Grade of Swallowing Toxicity is standardized for the head and neck cancer population, however, demonstrates promising clinical applications across populations to standardize the clinical rating of pharyngeal swallow safety and severity.   Factors that may increase risk of adverse event in presence of aspiration Noe & Lianne 2021):     Swallow Evaluation Recommendations Recommendations: PO diet PO Diet Recommendation: Regular;Thin liquids (Level 0) Liquid Administration via: Cup;Straw Medication Administration: Whole meds with liquid Supervision: Patient able to self-feed Swallowing strategies  : effortful swallow;Avoid mixed consistencies Postural changes: Position pt fully upright for meals;Stay upright 30-60 min after  meals Oral care recommendations: Oral care BID (2x/day)  PATIENT REPORTED OUTCOME MEASURES (PROM): Communication Participation Item Bank: provided 01/05/24 with instructions to return next session.                                                                                                                            TREATMENT DATE:   01/05/24: CPIB provided to pt today. Discussed/reviewed MBS with pt today. Stressed gather and swallow concept for each swallow was much like swallowing with more intent/purpose like SLP and pt had discussed about  his speech in previous session. SLP assisted pt with practice with gather and swallow concept with swallows of water. Pt with more effort with swallow on the swallows he thought gather and swallow was more salient for him. SLP then developed HEP for pt and SLP assessed pt's procedure with these exercises and corrected PRN. Pt to complete at least 5 days/week with 30 reps/day.   12/22/23: NEEDS CPIB NEXT SESSION. Explained the nature of fluoroscopy with pt, and that this machine will record his swallowing with food textures and liquids. Explained about dB being a logarithmic scale. SLP shaped pt's loud /a/ to average 82dB, started at 73dB. Pt read sentences and req'd SLP usual mod-max cues to use intent and purpose with effortful voice. Digital recording provided beneficial auditory feedback of pt's productions. Homework to read out loud 30 seconds x10, and perform 10 loud /a/, BID.  12/09/23: n/a  PATIENT EDUCATION: Education details: See treatment date Person educated: Patient Education method: Explanation Education comprehension: verbalized understanding and needs further education  HOME EXERCISE PROGRAM: Eventually dysarthria program for louder speech, possible dysphagia program   GOALS: Goals reviewed with patient? Yes, generally  SHORT TERM GOALS: Target date: 01/09/24  Pt will undergo MBS if clinically indicated Baseline: Goal  status: met  2.  Pt will demo 18/20 sentences with WNL volume Baseline:  Goal status: INITIAL  3.  Pt will demo WNL volume in 5 minutes conversation Baseline:  Goal status: INITIAL  4.  Pt will complete dysphagia HEP (if indicated following MBS) with rare min A Baseline:  Goal status: INITIAL   LONG TERM GOALS: Target date: 02/06/24  Pt will have improved PROM Baseline:  Goal status: INITIAL  2.  Pt will demo WNL volume in 10 minutes conversation Baseline:  Goal status: INITIAL  3.  Pt will complete dysphagia HEP (if indicated following MBS) with mod I in 3 sessions Baseline:  Goal status: INITIAL  4.  Pt will demo knowledge about where to find reputable info about speech/language info with Parkinson's on the internet Baseline:  Goal status: INITIAL   ASSESSMENT:  CLINICAL IMPRESSION: Patient is a 73 y.o. M who was seen today for treatment of speech and language in light of PD. SLP and pt reviewed his MBS. See treatment date above for today's date for further details on today's session. On date of eval he demonstrated incr'd volume and intent with short conversation today proving to be a good candidate for skilled ST. Wife states pt's volume is such that she has to ask him to repeat more often than she had to 5-6 years ago.    OBJECTIVE IMPAIRMENTS: Objective impairments include dysarthria and dysphagia. These impairments are limiting patient from household responsibilities, ADLs/IADLs, effectively communicating at home and in community, and safety when swallowing.Factors affecting potential to achieve goals and functional outcome are none noted today. Patient will benefit from skilled SLP services to address above impairments and improve overall function.  REHAB POTENTIAL: Good  PLAN:  SLP FREQUENCY: 2x/week  SLP DURATION: 8 weeks  PLANNED INTERVENTIONS: Aspiration precaution training, Pharyngeal strengthening exercises, Diet toleration management , Cueing  hierachy, Internal/external aids, Oral motor exercises, Functional tasks, SLP instruction and feedback, Compensatory strategies, Patient/family education, [phone] Treatment of speech (30 or 45 min) , and 07473 Treatment of swallowing function    Manahil Vanzile, CCC-SLP 01/05/2024, 10:41 PM

## 2024-01-07 ENCOUNTER — Encounter: Payer: Self-pay | Admitting: Physical Therapy

## 2024-01-07 ENCOUNTER — Ambulatory Visit

## 2024-01-07 ENCOUNTER — Ambulatory Visit: Admitting: Physical Therapy

## 2024-01-07 DIAGNOSIS — M6281 Muscle weakness (generalized): Secondary | ICD-10-CM

## 2024-01-07 DIAGNOSIS — G20A1 Parkinson's disease without dyskinesia, without mention of fluctuations: Secondary | ICD-10-CM

## 2024-01-07 DIAGNOSIS — R471 Dysarthria and anarthria: Secondary | ICD-10-CM

## 2024-01-07 DIAGNOSIS — R2689 Other abnormalities of gait and mobility: Secondary | ICD-10-CM

## 2024-01-07 DIAGNOSIS — R29898 Other symptoms and signs involving the musculoskeletal system: Secondary | ICD-10-CM

## 2024-01-07 DIAGNOSIS — R2681 Unsteadiness on feet: Secondary | ICD-10-CM

## 2024-01-07 DIAGNOSIS — R1312 Dysphagia, oropharyngeal phase: Secondary | ICD-10-CM

## 2024-01-07 DIAGNOSIS — R278 Other lack of coordination: Secondary | ICD-10-CM

## 2024-01-07 NOTE — Patient Instructions (Signed)
Bag Exercises:  Small trash bag or produce bag works best.  For all exercises, sit with big posture (sit up tall with head up) and use big movements. Perform the following exercises 1 times per day.  Hold end of bag in one hand. Stretch fingers out big to draw the entire bag into your palm. Repeat 5 times with each hand.  Hold bag in one hand. Stretch both arms/hands out to the side as big as you can. Then, pass bag from one hand to the other IN FRONT of you. Stretch arms back out big after each pass. Repeat 10 times.  Hold bag in one hand. Stretch both arms/hands out to the side as big as you can. Then, pass bag from one hand to the other BEHIND you. Stretch arms back out big after each pass. Repeat 10 times.  Hold bag in one hand. Stretch both arms/hands out to the side in a diagonal as big as you can. Then, pass bag from one hand to the other IN FRONT of you in a diagonal pattern. Stretch arms back out big after each pass. Repeat 10 times.  Hold bag in both hands in front of you with hands/arms shoulder length apart. Move bag behind your head. Repeat 10 times.  Hold bag in both hands in front of you with hands/arms shoulder length apart. Lift leg and move bag completely under each foot and back. Repeat 10 times on each side.  Hold bag in right hand. Move right hand to reach behind shoulder. Then, reach behind back with left hand to pass bag from right hand to left hand. Switch sides. Repeat 10 times on each side.  Hold bag in one hand. Toss bag up and catch with the same/opposite hand. Repeat 10 times with each hand.

## 2024-01-07 NOTE — Therapy (Signed)
 OUTPATIENT PHYSICAL THERAPY NEURO TREATMENT NOTE   Patient Name: SHUBHAM THACKSTON MRN: [id number] DOB:1950/12/28, 73 y.o., male Today's Date: 01/07/2024   PCP: Janey Santos, MD REFERRING PROVIDER: Evonnie Asberry RAMAN, DO  END OF SESSION:  PT End of Session - 01/07/24 0936     Visit Number 7    Number of Visits 14    Date for Recertification  02/10/24    Authorization Type [employer]    PT Start Time 0934    PT Stop Time 1012    PT Time Calculation (min) 38 min    Equipment Utilized During Treatment --   close supervision   Activity Tolerance Patient tolerated treatment well    Behavior During Therapy [facility] for tasks assessed/performed             Past Medical History:  Diagnosis Date   Adenomatous polyp of colon 02/2004   Anemia    Anxiety    Arthritis    BPH (benign prostatic hyperplasia)    Colonic varices    Degenerative joint disease    Depression    Depression    Fatty liver    GERD (gastroesophageal reflux disease)    Heart murmur    Hemorrhoids    Hyperlipidemia    Hypertension    IBS (irritable bowel syndrome)    Prostate cancer (HCC) 2011   S/P minimally invasive mitral valve repair 04/11/2020   Complex valvuloplasty including artificial Gore-tex neochord placement x8 with suture plication of posterior commissure and 30 mm Sorin Memo 3D ring annuloplasty via right mini thoracotomy approach   Thrombocytopenia    Thyroid  nodule    Past Surgical History:  Procedure Laterality Date   BUBBLE STUDY  12/22/2019   Procedure: BUBBLE STUDY;  Surgeon: Barbaraann Darryle Ned, MD;  Location[facility] ENDOSCOPY;  Service: Cardiovascular;;   CARDIAC CATHETERIZATION     COLONOSCOPY     MASS EXCISION Right 05/14/2021   Procedure: REPAIR RIGHT CHEST WALL HERNIA WITH MESH;  Surgeon: Kerrin Elspeth BROCKS, MD;  Location: [facility] OR;  Service: Thoracic;  Laterality: Right;   MITRAL VALVE REPAIR Right 04/11/2020   Procedure: MINIMALLY INVASIVE MITRAL VALVE REPAIR (MVR) USING  MEMO 4D RING SIZE ;  Surgeon: Dusty Sudie DEL, MD;  Location: [facility] OR;  Service: Open Heart Surgery;  Laterality: Right;   PROSTATECTOMY     RIGHT/LEFT HEART CATH AND CORONARY ANGIOGRAPHY N/A 12/22/2019   Procedure: RIGHT/LEFT HEART CATH AND CORONARY ANGIOGRAPHY;  Surgeon: Wonda Sharper, MD;  Location: [facility] INVASIVE CV LAB;  Service: Cardiovascular;  Laterality: N/A;   TEE WITHOUT CARDIOVERSION N/A 12/22/2019   Procedure: TRANSESOPHAGEAL ECHOCARDIOGRAM (TEE);  Surgeon: Barbaraann Darryle Ned, MD;  Location: [facility] ENDOSCOPY;  Service: Cardiovascular;  Laterality: N/A;   TEE WITHOUT CARDIOVERSION N/A 04/11/2020   Procedure: TRANSESOPHAGEAL ECHOCARDIOGRAM (TEE);  Surgeon: Dusty Sudie DEL, MD;  Location: [facility] OR;  Service: Open Heart Surgery;  Laterality: N/A;   TONSILLECTOMY     TOOTH EXTRACTION  03/07/2020   TYMPANOPLASTY  01/2016   Patient Active Problem List   Diagnosis Date Noted   Incisional hernia 05/14/2021   S/P minimally invasive mitral valve repair 04/11/2020   Mitral regurgitation 12/20/2019   VARICES OF OTHER SITES 12/30/2007   History of colonic polyps 12/25/2007    ONSET DATE: August 2024  REFERRING DIAG:  G20.A1 (ICD-10-CM) - Parkinson's disease without dyskinesia or fluctuating manifestations (HCC)    THERAPY DIAG:  Unsteadiness on feet  Muscle weakness (generalized)  Other abnormalities of gait and mobility  Rationale for Evaluation and Treatment: Rehabilitation  SUBJECTIVE:                                                                                                                                                                                             SUBJECTIVE STATEMENT: In general, feel like I'm a little more mobile and moving more freely. Pt accompanied by: self  PERTINENT HISTORY: hx of mitral valve replacement, tendon problem in R shoulder (hx of this, tendon detached from shoulder), hx of L RTC and R RTC issues  PAIN:  Are you having pain?  No  PRECAUTIONS: None  RED FLAGS: None   WEIGHT BEARING RESTRICTIONS:   FALLS: Has patient fallen in last 6 months? one fall a couple weeks ago on his treadmill  LIVING ENVIRONMENT: Lives with: lives with their spouse Lives in: House/apartment Stairs: stairs inside/outside, does have chair lift available if needed Has following equipment at home: None  PLOF: Independent  Exercise routine at home:  treadmill at home x 30 minutes, light weights, and some balance exercises 3-4 days per week.  PATIENT GOALS: balance  OBJECTIVE:   Discussed weekly exercise routine: M/W PT sessions; weights on alternating days Tues-stretching Treadmill daily  TODAY'S TREATMENT: 01/07/2024 Activity Comments  Gait warm up x 2 min   Review HEP Good return demo  3 M walk backwards:  6.37 sec   10 M walk:  7.15 sec (4.58 ft/sec)   -Forwards/backwards walking along counter x 1 min -Forward/back tandem, then marching tandem 1 min each -Forward/back walking 15 ft in clinic area, x 1 min Slowed pace, good form  Sit to stand on Airex EO x 5, EC x 5  supervision             Access Code: [email or web address] URL: [email or web address] Date: 12/17/2023, 12/24/2023 Prepared by: [facility] - Outpatient  Rehab - [facility]  *PWR MOVES STANDING, 10-20 reps 1x/day Exercises - Alternating Step Backward with Support  - 1 x daily - 7 x weekly - 1-2 sets - 10 reps - Goblet Squat with Kettlebell  - 2-3 x weekly - 3 sets - 10 reps - Sumo Squat with Dumbbell  - 2-3 x weekly - 3 sets - 10 reps - Reverse Lunge  - 2-3 x weekly - 3 sets - 10 reps - Kettlebell Suitcase Carry  - 2-3 x weekly - Standing Bent Over Single Arm Shoulder Row  - 2-3 x weekly - 3 sets - 10 reps  PATIENT EDUCATION: Education details: Review of HEP, discussion of current exercise program and options for community fitness Person educated: Patient Education method: Explanation, Demonstration,  and Handouts Education  comprehension: verbalized understanding, returned demonstration, and needs further education  ---------------------------------- Note: Objective measures were completed at Evaluation unless otherwise noted.  DIAGNOSTIC FINDINGS:   COGNITION: Overall cognitive status: Within functional limits for tasks assessed   SENSATION: WFL  COORDINATION: intact  EDEMA:  none  MUSCLE TONE: slight, 1+ bilateral biceps  MUSCLE LENGTH: WNL  DTRs:  NT  POSTURE: No Significant postural limitations  LOWER EXTREMITY ROM:     WNL  LOWER EXTREMITY MMT:    5/5  BED MOBILITY:  indep  TRANSFERS: indep    CURB:  Findings: indep  STAIRS: Not tested--reports independent but relies on HR for security GAIT: Findings: Comments: no deviations, decreased speed  FUNCTIONAL TESTS:  5 times sit to stand: 11 sec 10 meter walk test: 12.13 sec = 2.7 ft/sec Mini-BESTest: 22/28 : 5.3 sec                                                                                                                                TREATMENT DATE: 12/09/23    PATIENT EDUCATION: Education details: discussed relevant exercise intervention strategies/methods for tx of PD, initiated discussion of group classes in community, rationale of interventions Person educated: Patient and Spouse Education method: Explanation Education comprehension: needs further education  HOME EXERCISE PROGRAM: TBD  GOALS: Goals reviewed with patient? Yes  SHORT TERM GOALS: Target date: 01/06/2024    Patient will be independent in HEP to improve functional outcomes Baseline: Goal status: MET, 01/06/2024  2.  Demo reduced risk for falls per time 4.8 sec 3-Meter Backwards Walk Test Baseline: 5.3 sec>6.37 sec 01/06/2024 Goal status: IN PROGRESS, 01/06/2024  3.  Demo ability to sustain speed of 3.25 ft/sec during ambulation to improve community mobility Baseline: 2.7 ft/sec>4.58 ft/sec Goal status: MET,  01/06/2024   LONG TERM GOALS: Target date: 02/10/2024    Pt to verbalize understanding or participation in community-based classes for people with Parkinson's to improve activity carryover Baseline:  Goal status: IN PROGRESS  2.  Demo improved balance and reduced fall risk per score 25/28 Mini-BESTest Baseline: 22/28 Goal status: IN PROGRESS  3.  Patient to teach-back metrics for defining and executing high intensity training Baseline:  Goal status: IN PROGRESS   ASSESSMENT:  CLINICAL IMPRESSION: Pt presents today reporting overall improved mobility during course of therapy. Skilled PT session focused on assessing STGS, with pt meeting 2 of 3 STGs.  He has improved gait velocity to > 4 ft/sec; 3 M walk backwards at 6.37 seconds does continue to indicate increased fall risk.  Focused on posterior direction activities today, with pt able to perform them, just at slowed pace.  He will continue to benefit from skilled PT towards goals for improved functional mobility and decreased fall risk.   OBJECTIVE IMPAIRMENTS: decreased activity tolerance, decreased balance, decreased endurance, decreased knowledge of condition, difficulty walking, and impaired tone.   ACTIVITY LIMITATIONS: carrying, lifting, stairs, and locomotion level  PARTICIPATION LIMITATIONS: community activity, yard work, and exercise routine  PERSONAL FACTORS: Age, Time since onset of injury/illness/exacerbation, and 1 comorbidity: PMH are also affecting patient's functional outcome.   REHAB POTENTIAL: Excellent  CLINICAL DECISION MAKING: Evolving/moderate complexity  EVALUATION COMPLEXITY: Moderate  PLAN:  PT FREQUENCY: 2x/wk x 4 wks, then 1x/wk x 4 wks  PT DURATION: 8 weeks  PLANNED INTERVENTIONS: 97750- Physical Performance Testing, 97110-Therapeutic exercises, 97530- Therapeutic activity, [id number]- Neuromuscular re-education, 97535- Self Care, [id number]- Manual therapy, [id number]- Gait training, [phone]- Canalith  repositioning, [phone]- Aquatic Therapy, and Patient/Family education  PLAN FOR NEXT SESSION: Work on step strategy varied directions, compliant surface balance, high intensity interval training, gait activities and relevant metrics for self-monitoring.  Continue to discuss plans for joining community fitness class(es) upon d/c from PT  Greig Anon, PT 01/07/24 10:17 AM Phone: [phone] Fax: [phone]  [facility] at [facility] [address] [place], [address] [place], [place] [address] Phone # [phone] Fax # [phone]

## 2024-01-07 NOTE — Therapy (Signed)
 OUTPATIENT SPEECH LANGUAGE PATHOLOGY PARKINSON'S TREATMENT   Patient Name: Tyler [name] MRN: [id number] DOB:September 13, 1950, 73 y.o., male Today's Date: 01/07/2024  PCP: Janey Santos, MD REFERRING PROVIDER: Evonnie Stabs, DO  END OF SESSION:  End of Session - 01/07/24 0950     Visit Number 4    Number of Visits 17    Date for Recertification  02/06/24    SLP Start Time 0848    SLP Stop Time  0930    SLP Time Calculation (min) 42 min    Activity Tolerance Patient tolerated treatment well             Past Medical History:  Diagnosis Date   Adenomatous polyp of colon 02/2004   Anemia    Anxiety    Arthritis    BPH (benign prostatic hyperplasia)    Colonic varices    Degenerative joint disease    Depression    Depression    Fatty liver    GERD (gastroesophageal reflux disease)    Heart murmur    Hemorrhoids    Hyperlipidemia    Hypertension    IBS (irritable bowel syndrome)    Prostate cancer (HCC) 2011   S/P minimally invasive mitral valve repair 04/11/2020   Complex valvuloplasty including artificial Gore-tex neochord placement x8 with suture plication of posterior commissure and 30 mm Sorin Memo 3D ring annuloplasty via right mini thoracotomy approach   Thrombocytopenia    Thyroid  nodule    Past Surgical History:  Procedure Laterality Date   BUBBLE STUDY  12/22/2019   Procedure: BUBBLE STUDY;  Surgeon: Barbaraann Darryle Ned, MD;  Location: [facility] ENDOSCOPY;  Service: Cardiovascular;;   CARDIAC CATHETERIZATION     COLONOSCOPY     MASS EXCISION Right 05/14/2021   Procedure: REPAIR RIGHT CHEST WALL HERNIA WITH MESH;  Surgeon: Kerrin Elspeth BROCKS, MD;  Location: [facility] OR;  Service: Thoracic;  Laterality: Right;   MITRAL VALVE REPAIR Right 04/11/2020   Procedure: MINIMALLY INVASIVE MITRAL VALVE REPAIR (MVR) USING MEMO 4D RING SIZE ;  Surgeon: Dusty Sudie DEL, MD;  Location: [facility] OR;  Service: Open Heart Surgery;  Laterality: Right;   PROSTATECTOMY     RIGHT/LEFT  HEART CATH AND CORONARY ANGIOGRAPHY N/A 12/22/2019   Procedure: RIGHT/LEFT HEART CATH AND CORONARY ANGIOGRAPHY;  Surgeon: Wonda Sharper, MD;  Location: [facility] INVASIVE CV LAB;  Service: Cardiovascular;  Laterality: N/A;   TEE WITHOUT CARDIOVERSION N/A 12/22/2019   Procedure: TRANSESOPHAGEAL ECHOCARDIOGRAM (TEE);  Surgeon: Barbaraann Darryle Ned, MD;  Location: [facility] ENDOSCOPY;  Service: Cardiovascular;  Laterality: N/A;   TEE WITHOUT CARDIOVERSION N/A 04/11/2020   Procedure: TRANSESOPHAGEAL ECHOCARDIOGRAM (TEE);  Surgeon: Dusty Sudie DEL, MD;  Location: [facility] OR;  Service: Open Heart Surgery;  Laterality: N/A;   TONSILLECTOMY     TOOTH EXTRACTION  03/07/2020   TYMPANOPLASTY  01/2016   Patient Active Problem List   Diagnosis Date Noted   Incisional hernia 05/14/2021   S/P minimally invasive mitral valve repair 04/11/2020   Mitral regurgitation 12/20/2019   VARICES OF OTHER SITES 12/30/2007   History of colonic polyps 12/25/2007    ONSET DATE: diagnosed one year ago; script 11/06/23  REFERRING DIAG: Hypophonia  THERAPY DIAG:  Dysarthria and anarthria  Dysphagia, oropharyngeal phase  Rationale for Evaluation and Treatment: Rehabilitation  SUBJECTIVE:   SUBJECTIVE STATEMENT: Not all of it went down.  Pt accompanied by: self  PERTINENT HISTORY: See above. Pt dx'd with PD August 2024 with sx prior to that.  PAIN:  Are you having  pain? Yes: NPRS scale: 3/10 Pain location: abdominal Pain description: cramp/ache Aggravating factors: IDK Relieving factors: IDK  FALLS: Has patient fallen in last 6 months?  See PT evaluation for details   PATIENT GOALS: Improve speech and swallowing  OBJECTIVE:  Note: Objective measures were completed at Evaluation unless otherwise noted.  MBS 12/22/23: Pt demonstrates a mild oropharyngeal dysphagia consisting of good airway protection, but poor oral efficiency and mild pharyngeal residue. Pt recommended to continue regular diet and thin liquids with  f/u with OP SLP for appropriate strategies.    Pt has inefficient oral phase with unnecessary lingual movement to gather bolus and initiate swallow as well a piecemeal transit and loss of bolus to floor of mouth and buccal cavity. When cued to do so pt can increase efficency and improve bolus cohesion to decrease oral residue and reduce number of swallows gather it and swallow it all at once. Pt also has mild pharyngeal residue with thin liquids, likely due to oral deficits and slightly decreased ROM and duration of swallow. Again with cues for an effortful swallow pt increases these measures and reduces residue. Pt did not aspirate during the study, but expect some penetration or aspiration of oral residue and pharyngeal residue pooling after bites and sips. Also noted bumpy posterior pharyngeal wall and esophageal column against cervical spine, suggesting loss of tissue mass with age/sarcopenia. Pt to f/u with OP SLP to implement strategies. Would not suggest any texture changes.   DIGEST Swallow Severity Rating*             Safety: 0             Efficiency:1             Overall Pharyngeal Swallow Severity: 1 1: mild; 2: moderate; 3: severe; 4: profound  *The Dynamic Imaging Grade of Swallowing Toxicity is standardized for the head and neck cancer population, however, demonstrates promising clinical applications across populations to standardize the clinical rating of pharyngeal swallow safety and severity.   Factors that may increase risk of adverse event in presence of aspiration Noe & Lianne 2021):     Swallow Evaluation Recommendations Recommendations: PO diet PO Diet Recommendation: Regular;Thin liquids (Level 0) Liquid Administration via: Cup;Straw Medication Administration: Whole meds with liquid Supervision: Patient able to self-feed Swallowing strategies  : effortful swallow;Avoid mixed consistencies Postural changes: Position pt fully upright for meals;Stay upright 30-60 min  after meals Oral care recommendations: Oral care BID (2x/day)  PATIENT REPORTED OUTCOME MEASURES (PROM): Communication Participation Item Bank: provided 01/05/24 with instructions to return next session.                                                                                                                            TREATMENT DATE:   01/07/24: CPIB was not returned today. Will bring next time.  SWALLOW: Pt states the HEP for swallowing is difficult. In light of this SLP reviewed pt's HEP with him today. Pt  stated his confidence in performing the HEP was better, from 8-9/10 (where 10=100% confident about procedure). SPEECH: SLP modeled loud /a/ for pt and provided rationale for loud /a/, and he generated loud /a/ average 86dB. Pt has not been successful with routine completion with these. SLP provided some examples of how to accomplish that. SLP reviewed the rationale for reading out loud. With reading sentences, pt's average was 74dB with initial min A initially. SLP progressed into paragraphs and pt loudness was 71dB with initial cues for intent, deliberate, purposeful speech. For homework, SLP told pt to read out loud for 30 seconds x10 after loud /a/.   01/05/24: CPIB provided to pt today. Discussed/reviewed MBS with pt today. Stressed gather and swallow concept for each swallow was much like swallowing with more intent/purpose like SLP and pt had discussed about his speech in previous session. SLP assisted pt with practice with gather and swallow concept with swallows of water. Pt with more effort with swallow on the swallows he thought gather and swallow was more salient for him. SLP then developed HEP for pt and SLP assessed pt's procedure with these exercises and corrected PRN. Pt to complete at least 5 days/week with 30 reps/day.   12/22/23: NEEDS CPIB NEXT SESSION. Explained the nature of fluoroscopy with pt, and that this machine will record his swallowing with food textures  and liquids. Explained about dB being a logarithmic scale. SLP shaped pt's loud /a/ to average 82dB, started at 73dB. Pt read sentences and req'd SLP usual mod-max cues to use intent and purpose with effortful voice. Digital recording provided beneficial auditory feedback of pt's productions. Homework to read out loud 30 seconds x10, and perform 10 loud /a/, BID.  12/09/23: n/a  PATIENT EDUCATION: Education details: See treatment date Person educated: Patient Education method: Explanation, Demonstration, Verbal cues, and Handouts Education comprehension: verbalized understanding, returned demonstration, verbal cues required, and needs further education  HOME EXERCISE PROGRAM: HEP for louder speech, HEP for dysphagia   GOALS: Goals reviewed with patient? Yes, generally  SHORT TERM GOALS: Target date:  01/26/24 (due to visit count)  Pt will undergo MBS if clinically indicated Baseline: Goal status: met  2.  Pt will demo 18/20 sentences with WNL volume Baseline:  Goal status: INITIAL  3.  Pt will demo WNL volume in 5 minutes conversation Baseline:  Goal status: INITIAL  4.  Pt will complete dysphagia HEP (if indicated following MBS) with rare min A Baseline:  Goal status: INITIAL   LONG TERM GOALS: Target date: 02/06/24  Pt will have improved PROM Baseline:  Goal status: INITIAL  2.  Pt will demo WNL volume in 10 minutes conversation Baseline:  Goal status: INITIAL  3.  Pt will complete dysphagia HEP (if indicated following MBS) with mod I in 3 sessions Baseline:  Goal status: INITIAL  4.  Pt will demo knowledge about where to find reputable info about speech/language info with Parkinson's on the internet Baseline:  Goal status: INITIAL   ASSESSMENT:  CLINICAL IMPRESSION: Patient is a 73 y.o. M who was seen today for treatment of speech and language in light of PD. SLP and pt reviewed his dysphagia HEP today,along with some groundwork with louder speech. See  treatment date above for today's date for further details on today's session. On date of eval he demonstrated incr'd volume and intent with short conversation today proving to be a good candidate for skilled ST. Wife states pt's volume is such that she has to ask  him to repeat more often than she had to 5-6 years ago.    OBJECTIVE IMPAIRMENTS: Objective impairments include dysarthria and dysphagia. These impairments are limiting patient from household responsibilities, ADLs/IADLs, effectively communicating at home and in community, and safety when swallowing.Factors affecting potential to achieve goals and functional outcome are none noted today. Patient will benefit from skilled SLP services to address above impairments and improve overall function.  REHAB POTENTIAL: Good  PLAN:  SLP FREQUENCY: 2x/week  SLP DURATION: 8 weeks  PLANNED INTERVENTIONS: Aspiration precaution training, Pharyngeal strengthening exercises, Diet toleration management , Cueing hierachy, Internal/external aids, Oral motor exercises, Functional tasks, SLP instruction and feedback, Compensatory strategies, Patient/family education, [phone] Treatment of speech (30 or 45 min) , and [id number] Treatment of swallowing function    Amayrani Bennick, CCC-SLP 01/07/2024, 9:54 AM

## 2024-01-07 NOTE — Therapy (Signed)
 OUTPATIENT OCCUPATIONAL THERAPY PARKINSON'S TREATMENT  Patient Name: Tyler [name] MRN: [id number] DOB:Dec 30, 1950, 73 y.o., male Today's Date: 01/07/2024  PCP: Janey Santos REFERRING PROVIDER: Evonnie Asberry RAMAN, DO  END OF SESSION:   OT End of Session - 01/07/24 0934     Visit Number 4    Number of Visits 17    Date for Recertification  02/19/24    Authorization Type [employer]-VL:[place]    OT Start Time 1017    OT Stop Time 1056    OT Time Calculation (min) 39 min    Activity Tolerance Patient tolerated treatment well    Behavior During Therapy [facility] for tasks assessed/performed           Past Medical History:  Diagnosis Date   Adenomatous polyp of colon 02/2004   Anemia    Anxiety    Arthritis    BPH (benign prostatic hyperplasia)    Colonic varices    Degenerative joint disease    Depression    Depression    Fatty liver    GERD (gastroesophageal reflux disease)    Heart murmur    Hemorrhoids    Hyperlipidemia    Hypertension    IBS (irritable bowel syndrome)    Prostate cancer (HCC) 2011   S/P minimally invasive mitral valve repair 04/11/2020   Complex valvuloplasty including artificial Gore-tex neochord placement x8 with suture plication of posterior commissure and 30 mm Sorin Memo 3D ring annuloplasty via right mini thoracotomy approach   Thrombocytopenia    Thyroid  nodule    Past Surgical History:  Procedure Laterality Date   BUBBLE STUDY  12/22/2019   Procedure: BUBBLE STUDY;  Surgeon: Barbaraann Darryle Ned, MD;  Location: [facility] ENDOSCOPY;  Service: Cardiovascular;;   CARDIAC CATHETERIZATION     COLONOSCOPY     MASS EXCISION Right 05/14/2021   Procedure: REPAIR RIGHT CHEST WALL HERNIA WITH MESH;  Surgeon: Kerrin Elspeth BROCKS, MD;  Location: [facility] OR;  Service: Thoracic;  Laterality: Right;   MITRAL VALVE REPAIR Right 04/11/2020   Procedure: MINIMALLY INVASIVE MITRAL VALVE REPAIR (MVR) USING MEMO 4D RING SIZE ;  Surgeon: Dusty Sudie DEL, MD;   Location: [facility] OR;  Service: Open Heart Surgery;  Laterality: Right;   PROSTATECTOMY     RIGHT/LEFT HEART CATH AND CORONARY ANGIOGRAPHY N/A 12/22/2019   Procedure: RIGHT/LEFT HEART CATH AND CORONARY ANGIOGRAPHY;  Surgeon: Wonda Sharper, MD;  Location: [facility] INVASIVE CV LAB;  Service: Cardiovascular;  Laterality: N/A;   TEE WITHOUT CARDIOVERSION N/A 12/22/2019   Procedure: TRANSESOPHAGEAL ECHOCARDIOGRAM (TEE);  Surgeon: Barbaraann Darryle Ned, MD;  Location: [facility] ENDOSCOPY;  Service: Cardiovascular;  Laterality: N/A;   TEE WITHOUT CARDIOVERSION N/A 04/11/2020   Procedure: TRANSESOPHAGEAL ECHOCARDIOGRAM (TEE);  Surgeon: Dusty Sudie DEL, MD;  Location: [facility] OR;  Service: Open Heart Surgery;  Laterality: N/A;   TONSILLECTOMY     TOOTH EXTRACTION  03/07/2020   TYMPANOPLASTY  01/2016   Patient Active Problem List   Diagnosis Date Noted   Incisional hernia 05/14/2021   S/P minimally invasive mitral valve repair 04/11/2020   Mitral regurgitation 12/20/2019   VARICES OF OTHER SITES 12/30/2007   History of colonic polyps 12/25/2007    ONSET DATE: 12/09/2023 referral date; diagnosed with PD in 10/2022  REFERRING DIAG:  Diagnosis  G20.A1 (ICD-10-CM) - Parkinson's disease without dyskinesia or fluctuating manifestations (HCC)    THERAPY DIAG:  Other lack of coordination  Muscle weakness (generalized)  Other symptoms and signs involving the musculoskeletal system  Parkinson's disease without dyskinesia  or fluctuating manifestations (HCC)  Rationale for Evaluation and Treatment: Rehabilitation  SUBJECTIVE:   SUBJECTIVE STATEMENT: Pt reports things going well.   Pt accompanied by: self  PERTINENT HISTORY: hx of mitral valve replacement, prostate cancer  PRECAUTIONS: Other: mitral valve repair with ring, fall, cancer hx, HTN  WEIGHT BEARING RESTRICTIONS: No  PAIN:  Are you having pain? No  FALLS: Has patient fallen in last 6 months? Yes. Number of falls 1, treadmill  LIVING  ENVIRONMENT: Lives with: lives with their spouse Lives in: House/apartment Stairs: stairs inside/outside, does have chair lift available if needed Has following equipment at home: Wheelchair (manual), shower chair, and Grab bars  PLOF: Independent  PATIENT GOALS: I want to maintain function as long as possible. Several years ago my strength kind of went away.   OBJECTIVE:  Note: Objective measures were completed at Evaluation unless otherwise noted.  HAND DOMINANCE: Right  ADLs: Overall ADLs: Reports independence but some tasks that require FM coordination takes longer Transfers/ambulation related to ADLs: I/Mod I Eating: Independent Grooming: Independent UB Dressing: Difficulty buttoning LB Dressing: Difficulty buttoning pants Toileting: Independent Bathing:  Mod Independent Tub Shower transfers: Mod Independent Equipment: Shower seat with back, Grab bars, and Walk in shower  IADLs: Shopping: Wife usually completes even before diagnosis Light housekeeping: Completing laundry and washes dishes  Meal Prep: wife completes cooking even before PD Community mobility: Driving Medication management: Completes Landscape architect: Independent Handwriting: 100% legible and Mild micrographia  MOBILITY STATUS: Independent and Hx of falls  POSTURE COMMENTS:  No Significant postural limitations  ACTIVITY TOLERANCE: Activity tolerance: Pt reports no changes  FUNCTIONAL OUTCOME MEASURES: Fastening/unfastening 3 buttons: 34.54 seconds Physical performance test: PPT#2 (simulated eating) 17.50 seconds & PPT#4 (donning/doffing jacket): 23.04 seconds    COORDINATION: 9 Hole Peg test: Right: 40.30 sec; Left: 38.43 sec Box and Blocks:  Right 32 blocks, Left 38 blocks (Did at first bring R hand around standing piece but corrected with verbal cue)  UE ROM:  WFL  UE MMT:   4/5 grossly  SENSATION:  WFL; however reports occasional numbness in thenar eminence of R thumb, reports  doctor said it's most likely d/t overuse. Was a engineer, civil (consulting) for work.  MUSCLE TONE: RUE: Within functional limits and LUE: Within functional limits  COGNITION: Overall cognitive status: Within functional limits for tasks assessed  OBSERVATIONS: decreased strength, decreased FM coordination especially in R dominant hand, difficulty with ADLs especially buttoning/zipping up jacket, pt reports micrographia when writing longer sentences as well                                                                                                                    TREATMENT DATE:  01/07/24:  Reviewed card portion of FM coordination exercises, allowed modification for pushing cards off of deck with thumb with half the deck in each hand.  Educated pt in use of button hook and bottle openers for improved ease in ADLs. Provided verbal instruction and visual demo, pt returned understanding. Updated PSFS  goal.  Finally, educated pt in bag exercises to promote BIG movements. See Pt instructions for detailed handouts.   01/05/24 Large amplitude (PWR Hands): Educated pt in [place]!Hands exercises with min cues for BIG movements. OT providing demonstration with verbal cues for amplitude.  Handout provided. Coordination: with each UE, including: rotating small ball w/ fingertips, tossing/catching ball w/ ipsilateral UE, tossing/catching ball R <> L UE, picking up coins and placing into container, picking up coins and stacking/unstacking them, and picking up 5-10 coins 1 at a time and translating palm to fingertips to place in a stack    12/31/23  Administered PSFS, see Functional Outcomes for info. Pt educated in FM HEP to maintain coordination and dexterity in B hands for as long as possible. See pt instructions for detailed info.  Instructed pt in HEP with medium resistance green theraband to improve BUE strength for maintaining independence with functional transfers and ADLs as long as possible. Urged to  stop if any exercises cause pain, pt denied any pain. See Pt instructions for handout.  - Seated Shoulder Flexion with Self-Anchored Resistance  - 1 x daily - 4 x weekly - 3 sets - 10 reps - Seated Shoulder Horizontal Abduction with Resistance  - 1 x daily - 4 x weekly - 3 sets - 10 reps - Seated Elbow Flexion with Self-Anchored Resistance  - 1 x daily - 4 x weekly - 3 sets - 10 reps - Seated Elbow Extension with Self-Anchored Resistance  - 1 x daily - 4 x weekly - 3 sets - 10 reps   PATIENT EDUCATION: Education details: button hooks, bag exercises  Person educated: Patient Education method: Programmer, Multimedia, Demonstration, and Handouts Education comprehension: verbalized understanding, returned demonstration, and verbal cues required  HOME EXERCISE PROGRAM: 12/31/23 - Seated Shoulder Flexion with Self-Anchored Resistance  - 1 x daily - 4 x weekly - 3 sets - 10 reps - Seated Shoulder Horizontal Abduction with Resistance  - 1 x daily - 4 x weekly - 3 sets - 10 reps - Seated Elbow Flexion with Self-Anchored Resistance  - 1 x daily - 4 x weekly - 3 sets - 10 reps - Seated Elbow Extension with Self-Anchored Resistance  - 1 x daily - 4 x weekly - 3 sets - 10 reps  GOALS: Goals reviewed with patient? Yes  SHORT TERM GOALS: Target date: 01/21/24  Pt will verbalize understanding of adaptive strategies to increase ease with ADLS/IADLS   Baseline: New to OP OT Goal status: INITIAL  2.  Pt will demonstrate improved ease with fastening buttons as evidenced by decreasing 3 button/unbutton time by at least 5 seconds  Baseline: 34.54 seconds Goal status: INITIAL  3.  Pt will demonstrate improved FM coordination and functional use of RUE by increasing Box and Blocks score by at least 3 blocks Baseline: Right 32 blocks, Left 38 blocks Goal status: INITIAL  4.  Pt will be independent with HEPs for UE strength and FM coordination with handouts for reference prn  Baseline: New to OP OT  Goal status:  INITIAL  5. Pt will write short paragraph with 75% legibility and mild to no micrographia Baseline:  Wrote short sentence ([place]) with 100% legible and Mild micrographia. Pt reports micrographia worsens the longer he writes. Goal status: INITIAL  6. Pt will demonstrate an improved average PSFS score of at least 2 points, or individual score improvement of 3 points   Baseline:   Goal status: NEW  LONG  TERM GOALS: Target date: 02/18/24  Pt will write short paragraph with 100% legibility and mild to no micrographia Baseline:  Wrote short sentence ([place]) with 100% legible and Mild micrographia. Pt reports micrographia worsens the longer he writes. Goal status: INITIAL  2.  Pt will demonstrate improved fine motor coordination for ADLs as evidenced by decreasing 9 hole peg test score for R hand by at least 3 secs  Baseline: Right: 40.30 sec; Left: 38.43 sec Goal status: INITIAL  3.  Pt will demonstrate increased ease with dressing as evidenced by decreasing PPT#4 (don/ doff jacket) to 20 secs or less  Baseline: PPT#4 (donning/doffing jacket): 23.04 seconds Goal status: INITIAL   4.  Pt will verbalize understanding of ways to prevent future PD related complications and PD community resources. Baseline: Initiated with PWR!Moves classes Goal status: INITIAL   5.  Pt will demonstrate improved FM coordination and functional use of RUE by increasing Box and Blocks score by at least 6 blocks Baseline: Right 32 blocks, Left 38 blocks Goal status: INITIAL   ASSESSMENT:  CLINICAL IMPRESSION: Patient is a 73 y.o. male who was seen today for occupational therapy tx for PD management. Pt demonstrating mild increased difficulty with coordination tasks with RUE compared to LUE.  Pt responding well to bag exercises and recommendations for AE. Patient will continue to benefit from skilled therapy services to better manage occupational barriers secondary to  Parkinson's and improve independence and safety with ADLs and IADLs.   PERFORMANCE DEFICITS: in functional skills including ADLs, IADLs, coordination, dexterity, strength, Fine motor control, balance, cardiopulmonary status limiting function, and UE functional use, cognitive skills including safety awareness and sequencing, and psychosocial skills including coping strategies and environmental adaptation.   IMPAIRMENTS: are limiting patient from ADLs, IADLs, and leisure.     PLAN:  OT FREQUENCY: 2x/week  OT DURATION: 8 weeks  PLANNED INTERVENTIONS: 97168 OT Re-evaluation, 97535 self care/ADL training, 02889 therapeutic exercise, 97530 therapeutic activity, 97112 neuromuscular re-education, 97140 manual therapy, passive range of motion, functional mobility training, energy conservation, coping strategies training, patient/family education, and DME and/or AE instructions  RECOMMENDED OTHER SERVICES: none noted   CONSULTED AND AGREED WITH PLAN OF CARE: Patient  PLAN FOR NEXT SESSION: Assess handwriting with regular pen and weighted pen  Continue BIG movements Further AE recommendations prn    Rocky Dutch, OT 01/07/2024, 12:25 PM

## 2024-01-12 ENCOUNTER — Ambulatory Visit

## 2024-01-12 ENCOUNTER — Encounter: Payer: Self-pay | Admitting: Podiatry

## 2024-01-12 ENCOUNTER — Ambulatory Visit: Admitting: Podiatry

## 2024-01-12 ENCOUNTER — Ambulatory Visit: Admitting: Occupational Therapy

## 2024-01-12 ENCOUNTER — Ambulatory Visit: Admitting: Physical Therapy

## 2024-01-12 ENCOUNTER — Encounter: Payer: Self-pay | Admitting: Physical Therapy

## 2024-01-12 VITALS — Ht 69.0 in | Wt 151.8 lb

## 2024-01-12 DIAGNOSIS — R278 Other lack of coordination: Secondary | ICD-10-CM

## 2024-01-12 DIAGNOSIS — R29898 Other symptoms and signs involving the musculoskeletal system: Secondary | ICD-10-CM

## 2024-01-12 DIAGNOSIS — R1312 Dysphagia, oropharyngeal phase: Secondary | ICD-10-CM

## 2024-01-12 DIAGNOSIS — L600 Ingrowing nail: Secondary | ICD-10-CM

## 2024-01-12 DIAGNOSIS — M6281 Muscle weakness (generalized): Secondary | ICD-10-CM

## 2024-01-12 DIAGNOSIS — R2689 Other abnormalities of gait and mobility: Secondary | ICD-10-CM

## 2024-01-12 DIAGNOSIS — R471 Dysarthria and anarthria: Secondary | ICD-10-CM

## 2024-01-12 DIAGNOSIS — R2681 Unsteadiness on feet: Secondary | ICD-10-CM

## 2024-01-12 MED ORDER — CEPHALEXIN 500 MG PO CAPS: 500.0000 mg | ORAL_CAPSULE | Freq: Three times a day (TID) | ORAL | 0 refills | Status: AC

## 2024-01-12 NOTE — Therapy (Unsigned)
 OUTPATIENT SPEECH LANGUAGE PATHOLOGY [facility]   Patient Name: Tyler [name] MRN: [id number] DOB:03-01-1951, 73 y.o., male Today's Date: 01/13/2024  PCP: Janey Santos, MD REFERRING PROVIDER: Evonnie Stabs, DO  END OF SESSION:  End of Session - 01/12/24 1134     Visit Number 5    Number of Visits 17    Date for Recertification  02/06/24    SLP Start Time 1104    SLP Stop Time  1145    SLP Time Calculation (min) 41 min    Activity Tolerance Patient tolerated treatment well              Past Medical History:  Diagnosis Date   Adenomatous polyp of colon 02/2004   Anemia    Anxiety    Arthritis    BPH (benign prostatic hyperplasia)    Colonic varices    Degenerative joint disease    Depression    Depression    Fatty liver    GERD (gastroesophageal reflux disease)    Heart murmur    Hemorrhoids    Hyperlipidemia    Hypertension    IBS (irritable bowel syndrome)    Prostate cancer (HCC) 2011   S/P minimally invasive mitral valve repair 04/11/2020   Complex valvuloplasty including artificial Gore-tex neochord placement x8 with suture plication of posterior commissure and 30 mm Sorin Memo 3D ring annuloplasty via right mini thoracotomy approach   Thrombocytopenia    Thyroid  nodule    Past Surgical History:  Procedure Laterality Date   BUBBLE STUDY  12/22/2019   Procedure: BUBBLE STUDY;  Surgeon: Barbaraann Darryle Ned, MD;  Location: [facility] ENDOSCOPY;  Service: Cardiovascular;;   CARDIAC CATHETERIZATION     COLONOSCOPY     MASS EXCISION Right 05/14/2021   Procedure: REPAIR RIGHT CHEST WALL HERNIA WITH MESH;  Surgeon: Kerrin Elspeth BROCKS, MD;  Location: [facility] OR;  Service: Thoracic;  Laterality: Right;   MITRAL VALVE REPAIR Right 04/11/2020   Procedure: MINIMALLY INVASIVE MITRAL VALVE REPAIR (MVR) USING MEMO 4D RING SIZE ;  Surgeon: Dusty Sudie DEL, MD;  Location: [facility] OR;  Service: Open Heart Surgery;  Laterality: Right;   PROSTATECTOMY     RIGHT/LEFT  HEART CATH AND CORONARY ANGIOGRAPHY N/A 12/22/2019   Procedure: RIGHT/LEFT HEART CATH AND CORONARY ANGIOGRAPHY;  Surgeon: Wonda Sharper, MD;  Location: [facility] INVASIVE CV LAB;  Service: Cardiovascular;  Laterality: N/A;   TEE WITHOUT CARDIOVERSION N/A 12/22/2019   Procedure: TRANSESOPHAGEAL ECHOCARDIOGRAM (TEE);  Surgeon: Barbaraann Darryle Ned, MD;  Location: [facility] ENDOSCOPY;  Service: Cardiovascular;  Laterality: N/A;   TEE WITHOUT CARDIOVERSION N/A 04/11/2020   Procedure: TRANSESOPHAGEAL ECHOCARDIOGRAM (TEE);  Surgeon: Dusty Sudie DEL, MD;  Location: [facility] OR;  Service: Open Heart Surgery;  Laterality: N/A;   TONSILLECTOMY     TOOTH EXTRACTION  03/07/2020   TYMPANOPLASTY  01/2016   Patient Active Problem List   Diagnosis Date Noted   Incisional hernia 05/14/2021   S/P minimally invasive mitral valve repair 04/11/2020   Mitral regurgitation 12/20/2019   VARICES OF OTHER SITES 12/30/2007   History of colonic polyps 12/25/2007    ONSET DATE: diagnosed one year ago; script 11/06/23  REFERRING DIAG: Hypophonia  THERAPY DIAG:  Dysarthria and anarthria  Dysphagia, oropharyngeal phase  Rationale for Evaluation and Treatment: Rehabilitation  SUBJECTIVE:   SUBJECTIVE STATEMENT: I think it's been helpful at home when I have done the exercises  - it's boosted me up for the day or part of the day at least.  Pt accompanied  by: self  PERTINENT HISTORY: Pt dx'd with PD August 2024 with sx prior to that.  PAIN:  Are you having pain? No  FALLS: Has patient fallen in last 6 months?  See PT evaluation for details   PATIENT GOALS: Improve speech and swallowing  OBJECTIVE:  Note: Objective measures were completed at Evaluation unless otherwise noted.  MBS 12/22/23: Pt demonstrates a mild oropharyngeal dysphagia consisting of good airway protection, but poor oral efficiency and mild pharyngeal residue. Pt recommended to continue regular diet and thin liquids with f/u with OP SLP for appropriate  strategies.    Pt has inefficient oral phase with unnecessary lingual movement to gather bolus and initiate swallow as well a piecemeal transit and loss of bolus to floor of mouth and buccal cavity. When cued to do so pt can increase efficency and improve bolus cohesion to decrease oral residue and reduce number of swallows gather it and swallow it all at once. Pt also has mild pharyngeal residue with thin liquids, likely due to oral deficits and slightly decreased ROM and duration of swallow. Again with cues for an effortful swallow pt increases these measures and reduces residue. Pt did not aspirate during the study, but expect some penetration or aspiration of oral residue and pharyngeal residue pooling after bites and sips. Also noted bumpy posterior pharyngeal wall and esophageal column against cervical spine, suggesting loss of tissue mass with age/sarcopenia. Pt to f/u with OP SLP to implement strategies. Would not suggest any texture changes.   DIGEST Swallow Severity Rating*             Safety: 0             Efficiency:1             Overall Pharyngeal Swallow Severity: 1 1: mild; 2: moderate; 3: severe; 4: profound  *The Dynamic Imaging Grade of Swallowing Toxicity is standardized for the head and neck cancer population, however, demonstrates promising clinical applications across populations to standardize the clinical rating of pharyngeal swallow safety and severity.   Factors that may increase risk of adverse event in presence of aspiration Noe & Lianne 2021):     Swallow Evaluation Recommendations Recommendations: PO diet PO Diet Recommendation: Regular;Thin liquids (Level 0) Liquid Administration via: Cup;Straw Medication Administration: Whole meds with liquid Supervision: Patient able to self-feed Swallowing strategies  : effortful swallow;Avoid mixed consistencies Postural changes: Position pt fully upright for meals;Stay upright 30-60 min after meals Oral care  recommendations: Oral care BID (2x/day)  PATIENT REPORTED OUTCOME MEASURES (PROM): Communication Participation Item Bank: returned 01/12/24 and pt scored 16/30 with lower scores indicating more barriers to communication due to pt's softer speech. All answers were 1-2 (with options from 0-3).                                                                                                                            TREATMENT DATE:   01/12/24: Marty told SLP that he  feels like he is not as organized with his days as he was previously and wonders if PD has lessened his skills with executive function. SLP suggested he sit down with his medical appointments and to-do list and spend ~5 minutes thinking through his next day. He thanked SLP for this suggestion. With loud /a/ pt's average was 87dB, and SLP assisted pt in developing everyday sentences. He was told to complete 4-5 more sentences until next session.  Average today with these was 77dB and SLP had pt incr loudness and he did, to average 79dB.   01/07/24: CPIB was not returned today. Will bring next time.  SWALLOW: Pt states the HEP for swallowing is difficult. In light of this SLP reviewed pt's HEP with him today. Pt stated his confidence in performing the HEP was better, from 8-9/10 (where 10=100% confident about procedure). SPEECH: SLP modeled loud /a/ for pt and provided rationale for loud /a/, and he generated loud /a/ average 86dB. Pt has not been successful with routine completion with these. SLP provided some examples of how to accomplish that. SLP reviewed the rationale for reading out loud. With reading sentences, pt's average was 74dB with initial min A initially. SLP progressed into paragraphs and pt loudness was 71dB with initial cues for intent, deliberate, purposeful speech. For homework, SLP told pt to read out loud for 30 seconds x10 after loud /a/.   01/05/24: CPIB provided to pt today. Discussed/reviewed MBS with pt today. Stressed  gather and swallow concept for each swallow was much like swallowing with more intent/purpose like SLP and pt had discussed about his speech in previous session. SLP assisted pt with practice with gather and swallow concept with swallows of water. Pt with more effort with swallow on the swallows he thought gather and swallow was more salient for him. SLP then developed HEP for pt and SLP assessed pt's procedure with these exercises and corrected PRN. Pt to complete at least 5 days/week with 30 reps/day.   12/22/23: NEEDS CPIB NEXT SESSION. Explained the nature of fluoroscopy with pt, and that this machine will record his swallowing with food textures and liquids. Explained about dB being a logarithmic scale. SLP shaped pt's loud /a/ to average 82dB, started at 73dB. Pt read sentences and req'd SLP usual mod-max cues to use intent and purpose with effortful voice. Digital recording provided beneficial auditory feedback of pt's productions. Homework to read out loud 30 seconds x10, and perform 10 loud /a/, BID.  12/09/23: n/a  PATIENT EDUCATION: Education details: See treatment date Person educated: Patient Education method: Explanation, Demonstration, Verbal cues, and Handouts Education comprehension: verbalized understanding, returned demonstration, verbal cues required, and needs further education  HOME EXERCISE PROGRAM: HEP for louder speech, HEP for dysphagia   GOALS: Goals reviewed with patient? Yes, generally  SHORT TERM GOALS: Target date:  01/26/24 (due to visit count)  Pt will undergo MBS if clinically indicated Baseline: Goal status: met  2.  Pt will demo 18/20 sentences with WNL volume Baseline:  Goal status: INITIAL  3.  Pt will demo WNL volume in 5 minutes conversation Baseline:  Goal status: INITIAL  4.  Pt will complete dysphagia HEP (if indicated following MBS) with rare min A Baseline:  Goal status: INITIAL   LONG TERM GOALS: Target date: 02/06/24  Pt  will have improved PROM Baseline:  Goal status: INITIAL  2.  Pt will demo WNL volume in 10 minutes conversation Baseline:  Goal status: INITIAL  3.  Pt will  complete dysphagia HEP (if indicated following MBS) with mod I in 3 sessions Baseline:  Goal status: INITIAL  4.  Pt will demo knowledge about where to find reputable info about speech/language info with Parkinson's on the internet Baseline:  Goal status: INITIAL   ASSESSMENT:  CLINICAL IMPRESSION: Patient is a 73 y.o. M who was seen today for treatment of speech and language in light of PD. SLP and pt reviewed his dysphagia HEP today,along with some groundwork with louder speech. See treatment date above for today's date for further details on today's session. On date of eval he demonstrated incr'd volume and intent with short conversation today proving to be a good candidate for skilled ST. Wife states pt's volume is such that she has to ask him to repeat more often than she had to 5-6 years ago.    OBJECTIVE IMPAIRMENTS: Objective impairments include dysarthria and dysphagia. These impairments are limiting patient from household responsibilities, ADLs/IADLs, effectively communicating at home and in community, and safety when swallowing.Factors affecting potential to achieve goals and functional outcome are none noted today. Patient will benefit from skilled SLP services to address above impairments and improve overall function.  REHAB POTENTIAL: Good  PLAN:  SLP FREQUENCY: 2x/week  SLP DURATION: 8 weeks  PLANNED INTERVENTIONS: Aspiration precaution training, Pharyngeal strengthening exercises, Diet toleration management , Cueing hierachy, Internal/external aids, Oral motor exercises, Functional tasks, SLP instruction and feedback, Compensatory strategies, Patient/family education, [phone] Treatment of speech (30 or 45 min) , and [id number] Treatment of swallowing function    Landyn Lorincz, CCC-SLP 01/13/2024, 8:41 AM

## 2024-01-12 NOTE — Therapy (Signed)
 OUTPATIENT OCCUPATIONAL THERAPY PARKINSON'S TREATMENT  Patient Name: Tyler [name] MRN: [id number] DOB:05-18-1950, 73 y.o., male Today's Date: 01/12/2024  PCP: Janey Santos REFERRING PROVIDER: Evonnie Asberry RAMAN, DO  END OF SESSION:   OT End of Session - 01/12/24 1306     Visit Number 5    Number of Visits 17    Date for Recertification  02/19/24    Authorization Type [employer]-VL:[place]    OT Start Time 1154    OT Stop Time 1236    OT Time Calculation (min) 42 min    Activity Tolerance Patient tolerated treatment well    Behavior During Therapy [facility] for tasks assessed/performed            Past Medical History:  Diagnosis Date   Adenomatous polyp of colon 02/2004   Anemia    Anxiety    Arthritis    BPH (benign prostatic hyperplasia)    Colonic varices    Degenerative joint disease    Depression    Depression    Fatty liver    GERD (gastroesophageal reflux disease)    Heart murmur    Hemorrhoids    Hyperlipidemia    Hypertension    IBS (irritable bowel syndrome)    Prostate cancer (HCC) 2011   S/P minimally invasive mitral valve repair 04/11/2020   Complex valvuloplasty including artificial Gore-tex neochord placement x8 with suture plication of posterior commissure and 30 mm Sorin Memo 3D ring annuloplasty via right mini thoracotomy approach   Thrombocytopenia    Thyroid  nodule    Past Surgical History:  Procedure Laterality Date   BUBBLE STUDY  12/22/2019   Procedure: BUBBLE STUDY;  Surgeon: Barbaraann Darryle Ned, MD;  Location: [facility] ENDOSCOPY;  Service: Cardiovascular;;   CARDIAC CATHETERIZATION     COLONOSCOPY     MASS EXCISION Right 05/14/2021   Procedure: REPAIR RIGHT CHEST WALL HERNIA WITH MESH;  Surgeon: Kerrin Elspeth BROCKS, MD;  Location: [facility] OR;  Service: Thoracic;  Laterality: Right;   MITRAL VALVE REPAIR Right 04/11/2020   Procedure: MINIMALLY INVASIVE MITRAL VALVE REPAIR (MVR) USING MEMO 4D RING SIZE ;  Surgeon: Dusty Sudie DEL, MD;   Location: [facility] OR;  Service: Open Heart Surgery;  Laterality: Right;   PROSTATECTOMY     RIGHT/LEFT HEART CATH AND CORONARY ANGIOGRAPHY N/A 12/22/2019   Procedure: RIGHT/LEFT HEART CATH AND CORONARY ANGIOGRAPHY;  Surgeon: Wonda Sharper, MD;  Location: [facility] INVASIVE CV LAB;  Service: Cardiovascular;  Laterality: N/A;   TEE WITHOUT CARDIOVERSION N/A 12/22/2019   Procedure: TRANSESOPHAGEAL ECHOCARDIOGRAM (TEE);  Surgeon: Barbaraann Darryle Ned, MD;  Location: [facility] ENDOSCOPY;  Service: Cardiovascular;  Laterality: N/A;   TEE WITHOUT CARDIOVERSION N/A 04/11/2020   Procedure: TRANSESOPHAGEAL ECHOCARDIOGRAM (TEE);  Surgeon: Dusty Sudie DEL, MD;  Location: [facility] OR;  Service: Open Heart Surgery;  Laterality: N/A;   TONSILLECTOMY     TOOTH EXTRACTION  03/07/2020   TYMPANOPLASTY  01/2016   Patient Active Problem List   Diagnosis Date Noted   Incisional hernia 05/14/2021   S/P minimally invasive mitral valve repair 04/11/2020   Mitral regurgitation 12/20/2019   VARICES OF OTHER SITES 12/30/2007   History of colonic polyps 12/25/2007    ONSET DATE: 12/09/2023 referral date; diagnosed with PD in 10/2022  REFERRING DIAG:  Diagnosis  G20.A1 (ICD-10-CM) - Parkinson's disease without dyskinesia or fluctuating manifestations (HCC)    THERAPY DIAG:  Other symptoms and signs involving the musculoskeletal system  Other lack of coordination  Rationale for Evaluation and Treatment: Rehabilitation  SUBJECTIVE:   SUBJECTIVE STATEMENT: Pt reports that his family took some pictures this weekend.  Pt accompanied by: self  PERTINENT HISTORY: hx of mitral valve replacement, prostate cancer  PRECAUTIONS: Other: mitral valve repair with ring, fall, cancer hx, HTN  WEIGHT BEARING RESTRICTIONS: No  PAIN:  Are you having pain? No  FALLS: Has patient fallen in last 6 months? Yes. Number of falls 1, treadmill  LIVING ENVIRONMENT: Lives with: lives with their spouse Lives in: House/apartment Stairs: stairs  inside/outside, does have chair lift available if needed Has following equipment at home: Wheelchair (manual), shower chair, and Grab bars  PLOF: Independent  PATIENT GOALS: I want to maintain function as long as possible. Several years ago my strength kind of went away.   OBJECTIVE:  Note: Objective measures were completed at Evaluation unless otherwise noted.  HAND DOMINANCE: Right  ADLs: Overall ADLs: Reports independence but some tasks that require FM coordination takes longer Transfers/ambulation related to ADLs: I/Mod I Eating: Independent Grooming: Independent UB Dressing: Difficulty buttoning LB Dressing: Difficulty buttoning pants Toileting: Independent Bathing:  Mod Independent Tub Shower transfers: Mod Independent Equipment: Shower seat with back, Grab bars, and Walk in shower  IADLs: Shopping: Wife usually completes even before diagnosis Light housekeeping: Completing laundry and washes dishes  Meal Prep: wife completes cooking even before PD Community mobility: Driving Medication management: Completes Landscape architect: Independent Handwriting: 100% legible and Mild micrographia  MOBILITY STATUS: Independent and Hx of falls  POSTURE COMMENTS:  No Significant postural limitations  ACTIVITY TOLERANCE: Activity tolerance: Pt reports no changes  FUNCTIONAL OUTCOME MEASURES: Fastening/unfastening 3 buttons: 34.54 seconds Physical performance test: PPT#2 (simulated eating) 17.50 seconds & PPT#4 (donning/doffing jacket): 23.04 seconds    COORDINATION: 9 Hole Peg test: Right: 40.30 sec; Left: 38.43 sec Box and Blocks:  Right 32 blocks, Left 38 blocks (Did at first bring R hand around standing piece but corrected with verbal cue)  UE ROM:  WFL  UE MMT:   4/5 grossly  SENSATION:  WFL; however reports occasional numbness in thenar eminence of R thumb, reports doctor said it's most likely d/t overuse. Was a engineer, civil (consulting) for work.  MUSCLE TONE: RUE:  Within functional limits and LUE: Within functional limits  COGNITION: Overall cognitive status: Within functional limits for tasks assessed  OBSERVATIONS: decreased strength, decreased FM coordination especially in R dominant hand, difficulty with ADLs especially buttoning/zipping up jacket, pt reports micrographia when writing longer sentences as well                                                                                                                    TREATMENT DATE:  01/12/24 Coordination: engaged in picking up small pegs one at a time with RUE to complete peg board pattern.  Pt demonstrating good, intentional movements, when picking up and rotating pegs to place into pegboard.  OT challenging pt to remove pegs one at a time and hold in palm before transferring back to finger tips to place into container.  Pt demonstrating mild difficulty with translation but good effort.   Handwriting: Provided with handout of recommendations from [facility] to aid in legibility with handwriting.  Engaged in handwriting with various styles of pens with focus on legibility and sizing. OT educating on use of print font, lined paper, and various pen styles for improved handwriting. Reiterated use of large amplitude hand flicks prior to or during handwriting. 9 hole peg test: right: 37.41 sec, left: 38.38 sec.  Pt demonstrating 3 second improvement with RUE.    01/07/24:  Reviewed card portion of FM coordination exercises, allowed modification for pushing cards off of deck with thumb with half the deck in each hand.  Educated pt in use of button hook and bottle openers for improved ease in ADLs. Provided verbal instruction and visual demo, pt returned understanding. Updated PSFS goal.  Finally, educated pt in bag exercises to promote BIG movements. See Pt instructions for detailed handouts.   01/05/24 Large amplitude (PWR Hands): Educated pt in [place] exercises with min cues for BIG  movements. OT providing demonstration with verbal cues for amplitude.  Handout provided. Coordination: with each UE, including: rotating small ball w/ fingertips, tossing/catching ball w/ ipsilateral UE, tossing/catching ball R <> L UE, picking up coins and placing into container, picking up coins and stacking/unstacking them, and picking up 5-10 coins 1 at a time and translating palm to fingertips to place in a stack   PATIENT EDUCATION: Education details: handwriting, coordination, large amplitude hands Person educated: Patient Education method: Programmer, Multimedia, Demonstration, and Handouts Education comprehension: verbalized understanding, returned demonstration, and verbal cues required  HOME EXERCISE PROGRAM: 12/31/23 - Seated Shoulder Flexion with Self-Anchored Resistance  - 1 x daily - 4 x weekly - 3 sets - 10 reps - Seated Shoulder Horizontal Abduction with Resistance  - 1 x daily - 4 x weekly - 3 sets - 10 reps - Seated Elbow Flexion with Self-Anchored Resistance  - 1 x daily - 4 x weekly - 3 sets - 10 reps - Seated Elbow Extension with Self-Anchored Resistance  - 1 x daily - 4 x weekly - 3 sets - 10 reps  GOALS: Goals reviewed with patient? Yes  SHORT TERM GOALS: Target date: 01/21/24  Pt will verbalize understanding of adaptive strategies to increase ease with ADLS/IADLS   Baseline: New to OP OT Goal status: in progress  2.  Pt will demonstrate improved ease with fastening buttons as evidenced by decreasing 3 button/unbutton time by at least 5 seconds  Baseline: 34.54 seconds Goal status: in progress  3.  Pt will demonstrate improved FM coordination and functional use of RUE by increasing Box and Blocks score by at least 3 blocks Baseline: Right 32 blocks, Left 38 blocks Goal status: in progress  4.  Pt will be independent with HEPs for UE strength and FM coordination with handouts for reference prn  Baseline: New to OP OT  Goal status: in progress  5. Pt will write short  paragraph with 75% legibility and mild to no micrographia Baseline:  Wrote short sentence ([place]) with 100% legible and Mild micrographia. Pt reports micrographia worsens the longer he writes. Goal status: in progress  6. Pt will demonstrate an improved average PSFS score of at least 2 points, or individual score improvement of 3 points   Baseline:   Goal status: in progress  LONG TERM GOALS: Target date: 02/18/24  Pt will write short paragraph with 100% legibility and mild to no micrographia  Baseline:  Wrote short sentence ([place]) with 100% legible and Mild micrographia. Pt reports micrographia worsens the longer he writes. Goal status: in progress  2.  Pt will demonstrate improved fine motor coordination for ADLs as evidenced by decreasing 9 hole peg test score for R hand by at least 3 secs  Baseline: Right: 40.30 sec; Left: 38.43 sec Goal status: in progress  3.  Pt will demonstrate increased ease with dressing as evidenced by decreasing PPT#4 (don/ doff jacket) to 20 secs or less  Baseline: PPT#4 (donning/doffing jacket): 23.04 seconds Goal status: in progress   4.  Pt will verbalize understanding of ways to prevent future PD related complications and PD community resources. Baseline: Initiated with PWR!Moves classes Goal status: in progress   5.  Pt will demonstrate improved FM coordination and functional use of RUE by increasing Box and Blocks score by at least 6 blocks Baseline: Right 32 blocks, Left 38 blocks Goal status: in progress   ASSESSMENT:  CLINICAL IMPRESSION: Patient is a 73 y.o. male who was seen today for occupational therapy tx for PD management. Pt demonstrating mild improvements in coordination tasks this session, min difficulty with translation of pegs from palm to finger tips but overall improvements in quality with peg board activity and timing during 9 hole peg test.  Pt demonstrating good technique and  understanding with handwriting recommendations.  Patient will continue to benefit from skilled therapy services to better manage occupational barriers secondary to Parkinson's and improve independence and safety with ADLs and IADLs.   PERFORMANCE DEFICITS: in functional skills including ADLs, IADLs, coordination, dexterity, strength, Fine motor control, balance, cardiopulmonary status limiting function, and UE functional use, cognitive skills including safety awareness and sequencing, and psychosocial skills including coping strategies and environmental adaptation.   IMPAIRMENTS: are limiting patient from ADLs, IADLs, and leisure.     PLAN:  OT FREQUENCY: 2x/week  OT DURATION: 8 weeks  PLANNED INTERVENTIONS: 97168 OT Re-evaluation, 97535 self care/ADL training, 02889 therapeutic exercise, 97530 therapeutic activity, 97112 neuromuscular re-education, 97140 manual therapy, passive range of motion, functional mobility training, energy conservation, coping strategies training, patient/family education, and DME and/or AE instructions  RECOMMENDED OTHER SERVICES: none noted   CONSULTED AND AGREED WITH PLAN OF CARE: Patient  PLAN FOR NEXT SESSION:   Continue BIG movements Further AE recommendations prn    Darriel Utter, OT 01/12/2024, 1:07 PM

## 2024-01-12 NOTE — Patient Instructions (Addendum)
   Have you taken a shower yet?  Do we have laundry that needs doing?  Shall I check the mail?  What does our schedule look like today?  Do you have your phone?

## 2024-01-12 NOTE — Therapy (Signed)
 OUTPATIENT PHYSICAL THERAPY NEURO TREATMENT NOTE   Patient Name: Tyler [name] MRN: [id number] DOB:02-Jul-1950, 73 y.o., male Today's Date: 01/12/2024   PCP: Janey Santos, MD REFERRING PROVIDER: Evonnie Asberry RAMAN, DO  END OF SESSION:  PT End of Session - 01/12/24 1018     Visit Number 8    Number of Visits 14    Date for Recertification  02/10/24    Authorization Type [employer]    PT Start Time 1022    PT Stop Time 1100    PT Time Calculation (min) 38 min    Equipment Utilized During Treatment --   close supervision   Activity Tolerance Patient tolerated treatment well    Behavior During Therapy WFL for tasks assessed/performed              Past Medical History:  Diagnosis Date   Adenomatous polyp of colon 02/2004   Anemia    Anxiety    Arthritis    BPH (benign prostatic hyperplasia)    Colonic varices    Degenerative joint disease    Depression    Depression    Fatty liver    GERD (gastroesophageal reflux disease)    Heart murmur    Hemorrhoids    Hyperlipidemia    Hypertension    IBS (irritable bowel syndrome)    Prostate cancer (HCC) 2011   S/P minimally invasive mitral valve repair 04/11/2020   Complex valvuloplasty including artificial Gore-tex neochord placement x8 with suture plication of posterior commissure and 30 mm Sorin Memo 3D ring annuloplasty via right mini thoracotomy approach   Thrombocytopenia    Thyroid  nodule    Past Surgical History:  Procedure Laterality Date   BUBBLE STUDY  12/22/2019   Procedure: BUBBLE STUDY;  Surgeon: Barbaraann Darryle Ned, MD;  Location: [facility] ENDOSCOPY;  Service: Cardiovascular;;   CARDIAC CATHETERIZATION     COLONOSCOPY     MASS EXCISION Right 05/14/2021   Procedure: REPAIR RIGHT CHEST WALL HERNIA WITH MESH;  Surgeon: Kerrin Elspeth BROCKS, MD;  Location: [facility] OR;  Service: Thoracic;  Laterality: Right;   MITRAL VALVE REPAIR Right 04/11/2020   Procedure: MINIMALLY INVASIVE MITRAL VALVE REPAIR (MVR)  USING MEMO 4D RING SIZE ;  Surgeon: Dusty Sudie DEL, MD;  Location: [facility] OR;  Service: Open Heart Surgery;  Laterality: Right;   PROSTATECTOMY     RIGHT/LEFT HEART CATH AND CORONARY ANGIOGRAPHY N/A 12/22/2019   Procedure: RIGHT/LEFT HEART CATH AND CORONARY ANGIOGRAPHY;  Surgeon: Wonda Sharper, MD;  Location: [facility] INVASIVE CV LAB;  Service: Cardiovascular;  Laterality: N/A;   TEE WITHOUT CARDIOVERSION N/A 12/22/2019   Procedure: TRANSESOPHAGEAL ECHOCARDIOGRAM (TEE);  Surgeon: Barbaraann Darryle Ned, MD;  Location: [facility] ENDOSCOPY;  Service: Cardiovascular;  Laterality: N/A;   TEE WITHOUT CARDIOVERSION N/A 04/11/2020   Procedure: TRANSESOPHAGEAL ECHOCARDIOGRAM (TEE);  Surgeon: Dusty Sudie DEL, MD;  Location: [facility] OR;  Service: Open Heart Surgery;  Laterality: N/A;   TONSILLECTOMY     TOOTH EXTRACTION  03/07/2020   TYMPANOPLASTY  01/2016   Patient Active Problem List   Diagnosis Date Noted   Incisional hernia 05/14/2021   S/P minimally invasive mitral valve repair 04/11/2020   Mitral regurgitation 12/20/2019   VARICES OF OTHER SITES 12/30/2007   History of colonic polyps 12/25/2007    ONSET DATE: August 2024  REFERRING DIAG:  G20.A1 (ICD-10-CM) - Parkinson's disease without dyskinesia or fluctuating manifestations (HCC)    THERAPY DIAG:  Muscle weakness (generalized)  Unsteadiness on feet  Other abnormalities of gait and  mobility  Rationale for Evaluation and Treatment: Rehabilitation  SUBJECTIVE:                                                                                                                                                                                             SUBJECTIVE STATEMENT: Going to see a doctor later today about the ingrown toenail. Pt accompanied by: self  PERTINENT HISTORY: hx of mitral valve replacement, tendon problem in R shoulder (hx of this, tendon detached from shoulder), hx of L RTC and R RTC issues  PAIN:  Are you having pain?  No  PRECAUTIONS: None  RED FLAGS: None   WEIGHT BEARING RESTRICTIONS:   FALLS: Has patient fallen in last 6 months? one fall a couple weeks ago on his treadmill  LIVING ENVIRONMENT: Lives with: lives with their spouse Lives in: House/apartment Stairs: stairs inside/outside, does have chair lift available if needed Has following equipment at home: None  PLOF: Independent  Exercise routine at home:  treadmill at home x 30 minutes, light weights, and some balance exercises 3-4 days per week.  PATIENT GOALS: balance  OBJECTIVE:    TODAY'S TREATMENT: 01/12/2024 Activity Comments  Dynamic balance/gait warm up: -forward walking x 2 minutes with focus on step length/arm swing -Forward/back walking x 1:30 -Sidestepping with added arm motions -Marching with opposite arm lifts x 1:30   On Airex: Forward step and weightshift x 10 Side step and weightshift x 10 Back step and weightshift x 10 Forward/back step and weightshift x 10 Feet apart/feet together EO and EC head turns/nods x 5 reps each   On incline/decline:  marching in place Forward/back stepping Feet apart/feet together EO/EC                    Access Code: [email or web address] URL: [email or web address] Date: 12/17/2023, 12/24/2023 Prepared by: [facility] - [facility]  *PWR MOVES STANDING, 10-20 reps 1x/day Exercises - Alternating Step Backward with Support  - 1 x daily - 7 x weekly - 1-2 sets - 10 reps - Goblet Squat with Kettlebell  - 2-3 x weekly - 3 sets - 10 reps - Sumo Squat with Dumbbell  - 2-3 x weekly - 3 sets - 10 reps - Reverse Lunge  - 2-3 x weekly - 3 sets - 10 reps - Kettlebell Suitcase Carry  - 2-3 x weekly - Standing Bent Over Single Arm Shoulder Row  - 2-3 x weekly - 3 sets - 10 reps  PATIENT EDUCATION: Education details: 11/10:  Continue current HEP, discussion of current exercise program and options for community fitness (pt may be  looking into cycling at  [facility], [employer], or [place]) Person educated: Patient Education method: Explanation, Demonstration, and Handouts Education comprehension: verbalized understanding, returned demonstration, and needs further education  ---------------------------------- Note: Objective measures were completed at Evaluation unless otherwise noted.  DIAGNOSTIC FINDINGS:   COGNITION: Overall cognitive status: Within functional limits for tasks assessed   SENSATION: WFL  COORDINATION: intact  EDEMA:  none  MUSCLE TONE: slight, 1+ bilateral biceps  MUSCLE LENGTH: WNL  DTRs:  NT  POSTURE: No Significant postural limitations  LOWER EXTREMITY ROM:     WNL  LOWER EXTREMITY MMT:    5/5  BED MOBILITY:  indep  TRANSFERS: indep    CURB:  Findings: indep  STAIRS: Not tested--reports independent but relies on HR for security GAIT: Findings: Comments: no deviations, decreased speed  FUNCTIONAL TESTS:  5 times sit to stand: 11 sec 10 meter walk test: 12.13 sec = 2.7 ft/sec Mini-BESTest: 22/28 : 5.3 sec                                                                                                                                TREATMENT DATE: 12/09/23    PATIENT EDUCATION: Education details: discussed relevant exercise intervention strategies/methods for tx of PD, initiated discussion of group classes in community, rationale of interventions Person educated: Patient and Spouse Education method: Explanation Education comprehension: needs further education  HOME EXERCISE PROGRAM: TBD  GOALS: Goals reviewed with patient? Yes  SHORT TERM GOALS: Target date: 01/06/2024    Patient will be independent in HEP to improve functional outcomes Baseline: Goal status: MET, 01/06/2024  2.  Demo reduced risk for falls per time 4.8 sec 3-Meter Backwards Walk Test Baseline: 5.3 sec>6.37 sec 01/06/2024 Goal status: IN PROGRESS, 01/06/2024  3.  Demo ability to sustain speed of  3.25 ft/sec during ambulation to improve community mobility Baseline: 2.7 ft/sec>4.58 ft/sec Goal status: MET, 01/06/2024   LONG TERM GOALS: Target date: 02/10/2024    Pt to verbalize understanding or participation in community-based classes for people with Parkinson's to improve activity carryover Baseline:  Goal status: IN PROGRESS  2.  Demo improved balance and reduced fall risk per score 25/28 Mini-BESTest Baseline: 22/28 Goal status: IN PROGRESS  3.  Patient to teach-back metrics for defining and executing high intensity training Baseline:  Goal status: IN PROGRESS   ASSESSMENT:  CLINICAL IMPRESSION: Pt presents today with no new complaints. Skilled PT session focused on dynamic balance and compliant surface work.  On compliant surface, he has mild increase in sway with EC activities, but overall does very well.  He is challenged on incline/decline surfaces, but again with min sway.  He will continue to benefit from work on step strategy for balance and for compliant surface and backwards gait.  He will continue to benefit from skilled PT towards goals for improved functional mobility and decreased fall risk.   OBJECTIVE IMPAIRMENTS: decreased activity tolerance, decreased balance, decreased endurance,  decreased knowledge of condition, difficulty walking, and impaired tone.   ACTIVITY LIMITATIONS: carrying, lifting, stairs, and locomotion level  PARTICIPATION LIMITATIONS: community activity, yard work, and exercise routine  PERSONAL FACTORS: Age, Time since onset of injury/illness/exacerbation, and 1 comorbidity: PMH are also affecting patient's functional outcome.   REHAB POTENTIAL: Excellent  CLINICAL DECISION MAKING: Evolving/moderate complexity  EVALUATION COMPLEXITY: Moderate  PLAN:  PT FREQUENCY: 2x/wk x 4 wks, then 1x/wk x 4 wks  PT DURATION: 8 weeks  PLANNED INTERVENTIONS: 97750- Physical Performance Testing, 97110-Therapeutic exercises, 97530- Therapeutic  activity, [id number]- Neuromuscular re-education, 97535- Self Care, 02859- Manual therapy, [id number]- Gait training, [phone]- Canalith repositioning, [phone]- Aquatic Therapy, and Patient/Family education  PLAN FOR NEXT SESSION: Work on step strategy varied directions, compliant surface balance, high intensity interval training, gait activities and relevant metrics for self-monitoring.  Continue to discuss plans for joining community fitness class(es) upon d/c from PT  Greig Anon, PT 01/12/24 11:02 AM Phone: [phone] Fax: [phone]  [facility] at [facility] [address] [place], [address] [place], [place] [address] Phone # [phone] Fax # [phone]

## 2024-01-12 NOTE — Patient Instructions (Signed)

## 2024-01-14 ENCOUNTER — Ambulatory Visit

## 2024-01-14 ENCOUNTER — Other Ambulatory Visit: Payer: Self-pay | Admitting: Cardiovascular Disease

## 2024-01-14 DIAGNOSIS — R471 Dysarthria and anarthria: Secondary | ICD-10-CM

## 2024-01-14 DIAGNOSIS — R2681 Unsteadiness on feet: Secondary | ICD-10-CM

## 2024-01-14 DIAGNOSIS — G20A1 Parkinson's disease without dyskinesia, without mention of fluctuations: Secondary | ICD-10-CM

## 2024-01-14 DIAGNOSIS — M6281 Muscle weakness (generalized): Secondary | ICD-10-CM

## 2024-01-14 DIAGNOSIS — R1312 Dysphagia, oropharyngeal phase: Secondary | ICD-10-CM

## 2024-01-14 DIAGNOSIS — R278 Other lack of coordination: Secondary | ICD-10-CM

## 2024-01-14 DIAGNOSIS — R2689 Other abnormalities of gait and mobility: Secondary | ICD-10-CM

## 2024-01-14 NOTE — Therapy (Signed)
 OUTPATIENT OCCUPATIONAL THERAPY PARKINSON'S TREATMENT  Patient Name: Tyler [name] MRN: [id number] DOB:May 07, 1950, 73 y.o., male Today's Date: 01/14/2024  PCP: Janey Santos REFERRING PROVIDER: Evonnie Asberry RAMAN, DO  END OF SESSION:   OT End of Session - 01/14/24 1200     Visit Number 6    Number of Visits 17    Date for Recertification  02/19/24    Authorization Type [employer]-VL:[place]    OT Start Time 1058    OT Stop Time 1138    OT Time Calculation (min) 40 min    Activity Tolerance Patient tolerated treatment well    Behavior During Therapy [facility] for tasks assessed/performed             Past Medical History:  Diagnosis Date   Adenomatous polyp of colon 02/2004   Anemia    Anxiety    Arthritis    BPH (benign prostatic hyperplasia)    Colonic varices    Degenerative joint disease    Depression    Depression    Fatty liver    GERD (gastroesophageal reflux disease)    Heart murmur    Hemorrhoids    Hyperlipidemia    Hypertension    IBS (irritable bowel syndrome)    Prostate cancer (HCC) 2011   S/P minimally invasive mitral valve repair 04/11/2020   Complex valvuloplasty including artificial Gore-tex neochord placement x8 with suture plication of posterior commissure and 30 mm Sorin Memo 3D ring annuloplasty via right mini thoracotomy approach   Thrombocytopenia    Thyroid  nodule    Past Surgical History:  Procedure Laterality Date   BUBBLE STUDY  12/22/2019   Procedure: BUBBLE STUDY;  Surgeon: Barbaraann Darryle Ned, MD;  Location: [facility] ENDOSCOPY;  Service: Cardiovascular;;   CARDIAC CATHETERIZATION     COLONOSCOPY     MASS EXCISION Right 05/14/2021   Procedure: REPAIR RIGHT CHEST WALL HERNIA WITH MESH;  Surgeon: Kerrin Elspeth BROCKS, MD;  Location: [facility] OR;  Service: Thoracic;  Laterality: Right;   MITRAL VALVE REPAIR Right 04/11/2020   Procedure: MINIMALLY INVASIVE MITRAL VALVE REPAIR (MVR) USING MEMO 4D RING SIZE ;  Surgeon: Dusty Sudie DEL, MD;   Location: [facility] OR;  Service: Open Heart Surgery;  Laterality: Right;   PROSTATECTOMY     RIGHT/LEFT HEART CATH AND CORONARY ANGIOGRAPHY N/A 12/22/2019   Procedure: RIGHT/LEFT HEART CATH AND CORONARY ANGIOGRAPHY;  Surgeon: Wonda Sharper, MD;  Location: [facility] INVASIVE CV LAB;  Service: Cardiovascular;  Laterality: N/A;   TEE WITHOUT CARDIOVERSION N/A 12/22/2019   Procedure: TRANSESOPHAGEAL ECHOCARDIOGRAM (TEE);  Surgeon: Barbaraann Darryle Ned, MD;  Location: [facility] ENDOSCOPY;  Service: Cardiovascular;  Laterality: N/A;   TEE WITHOUT CARDIOVERSION N/A 04/11/2020   Procedure: TRANSESOPHAGEAL ECHOCARDIOGRAM (TEE);  Surgeon: Dusty Sudie DEL, MD;  Location: [facility] OR;  Service: Open Heart Surgery;  Laterality: N/A;   TONSILLECTOMY     TOOTH EXTRACTION  03/07/2020   TYMPANOPLASTY  01/2016   Patient Active Problem List   Diagnosis Date Noted   Incisional hernia 05/14/2021   S/P minimally invasive mitral valve repair 04/11/2020   Mitral regurgitation 12/20/2019   VARICES OF OTHER SITES 12/30/2007   History of colonic polyps 12/25/2007    ONSET DATE: 12/09/2023 referral date; diagnosed with PD in 10/2022  REFERRING DIAG:  Diagnosis  G20.A1 (ICD-10-CM) - Parkinson's disease without dyskinesia or fluctuating manifestations (HCC)    THERAPY DIAG:  Other lack of coordination  Muscle weakness (generalized)  Parkinson's disease without dyskinesia or fluctuating manifestations (HCC)  Rationale for  Evaluation and Treatment: Rehabilitation  SUBJECTIVE:   SUBJECTIVE STATEMENT: Pt reports that his family took some pictures this weekend.  Pt accompanied by: self  PERTINENT HISTORY: hx of mitral valve replacement, prostate cancer  PRECAUTIONS: Other: mitral valve repair with ring, fall, cancer hx, HTN  WEIGHT BEARING RESTRICTIONS: No  PAIN:  Are you having pain? Yes: NPRS scale: 2/10 Pain location: L big toe Pain description: Had surgery on it for ingrown toenail Aggravating factors: Excessive  movement Relieving factors: rest, medication, ice  FALLS: Has patient fallen in last 6 months? Yes. Number of falls 1, treadmill  LIVING ENVIRONMENT: Lives with: lives with their spouse Lives in: House/apartment Stairs: stairs inside/outside, does have chair lift available if needed Has following equipment at home: Wheelchair (manual), shower chair, and Grab bars  PLOF: Independent  PATIENT GOALS: I want to maintain function as long as possible. Several years ago my strength kind of went away.   OBJECTIVE:  Note: Objective measures were completed at Evaluation unless otherwise noted.  HAND DOMINANCE: Right  ADLs: Overall ADLs: Reports independence but some tasks that require FM coordination takes longer Transfers/ambulation related to ADLs: I/Mod I Eating: Independent Grooming: Independent UB Dressing: Difficulty buttoning LB Dressing: Difficulty buttoning pants Toileting: Independent Bathing:  Mod Independent Tub Shower transfers: Mod Independent Equipment: Shower seat with back, Grab bars, and Walk in shower  IADLs: Shopping: Wife usually completes even before diagnosis Light housekeeping: Completing laundry and washes dishes  Meal Prep: wife completes cooking even before PD Community mobility: Driving Medication management: Completes Landscape architect: Independent Handwriting: 100% legible and Mild micrographia  MOBILITY STATUS: Independent and Hx of falls  POSTURE COMMENTS:  No Significant postural limitations  ACTIVITY TOLERANCE: Activity tolerance: Pt reports no changes  FUNCTIONAL OUTCOME MEASURES: Fastening/unfastening 3 buttons: 34.54 seconds Physical performance test: PPT#2 (simulated eating) 17.50 seconds & PPT#4 (donning/doffing jacket): 23.04 seconds    COORDINATION: 9 Hole Peg test: Right: 40.30 sec; Left: 38.43 sec Box and Blocks:  Right 32 blocks, Left 38 blocks (Did at first bring R hand around standing piece but corrected with verbal cue)   UE ROM:  WFL  UE MMT:   4/5 grossly  SENSATION:  WFL; however reports occasional numbness in thenar eminence of R thumb, reports doctor said it's most likely d/t overuse. Was a engineer, civil (consulting) for work.  MUSCLE TONE: RUE: Within functional limits and LUE: Within functional limits  COGNITION: Overall cognitive status: Within functional limits for tasks assessed  OBSERVATIONS: decreased strength, decreased FM coordination especially in R dominant hand, difficulty with ADLs especially buttoning/zipping up jacket, pt reports micrographia when writing longer sentences as well                                                                                                                    TREATMENT DATE:  01/14/24 Re-assessed Box and Blocks and PPTS (doing/undoing buttons and putting on/taking off jacket). See goals for updated measurements.  Reviewed with pt how to utilize button hooks for doing/undoing  buttons. D/t improvements noted in PPT advised pt to use button hook for fastening buttons only and use hands to undo buttons at this time. Did show pt how to undo buttons with button hook as well.  Assessed handwriting, pt wrote a short paragraph. 100% legibility, noted mild decrease in spacing and mild micrographia. Recommended gel pens rather than roller ball pens for improved control and to continue completing hand flicks before and during handwriting prn.  Finally practiced use of bottle opener and jar opener. Pt demonstrates good understanding and reports he has ordered some of his own that are on the way.  01/12/24 Coordination: engaged in picking up small pegs one at a time with RUE to complete peg board pattern.  Pt demonstrating good, intentional movements, when picking up and rotating pegs to place into pegboard.  OT challenging pt to remove pegs one at a time and hold in palm before transferring back to finger tips to place into container.  Pt demonstrating mild difficulty with  translation but good effort.   Handwriting: Provided with handout of recommendations from [facility] to aid in legibility with handwriting.  Engaged in handwriting with various styles of pens with focus on legibility and sizing. OT educating on use of print font, lined paper, and various pen styles for improved handwriting. Reiterated use of large amplitude hand flicks prior to or during handwriting. 9 hole peg test: right: 37.41 sec, left: 38.38 sec.  Pt demonstrating 3 second improvement with RUE.    01/07/24:  Reviewed card portion of FM coordination exercises, allowed modification for pushing cards off of deck with thumb with half the deck in each hand.  Educated pt in use of button hook and bottle openers for improved ease in ADLs. Provided verbal instruction and visual demo, pt returned understanding. Updated PSFS goal.  Finally, educated pt in bag exercises to promote BIG movements. See Pt instructions for detailed handouts.     PATIENT EDUCATION: Education details: handwriting, coordination, large amplitude hands Person educated: Patient Education method: Explanation, Demonstration, and Handouts Education comprehension: verbalized understanding, returned demonstration, and verbal cues required  HOME EXERCISE PROGRAM: 12/31/23 - Seated Shoulder Flexion with Self-Anchored Resistance  - 1 x daily - 4 x weekly - 3 sets - 10 reps - Seated Shoulder Horizontal Abduction with Resistance  - 1 x daily - 4 x weekly - 3 sets - 10 reps - Seated Elbow Flexion with Self-Anchored Resistance  - 1 x daily - 4 x weekly - 3 sets - 10 reps - Seated Elbow Extension with Self-Anchored Resistance  - 1 x daily - 4 x weekly - 3 sets - 10 reps  GOALS: Goals reviewed with patient? Yes  SHORT TERM GOALS: Target date: 01/21/24  Pt will verbalize understanding of adaptive strategies to increase ease with ADLS/IADLS   Baseline: New to OP OT Goal status: in progress  2.  Pt will demonstrate  improved ease with fastening buttons as evidenced by decreasing 3 button/unbutton time by at least 5 seconds  Baseline: 34.54 seconds 01/14/24: 25.66 seconds Goal status: GOAL MET  3.  Pt will demonstrate improved FM coordination and functional use of RUE by increasing Box and Blocks score by at least 3 blocks Baseline: Right 32 blocks, Left 38 blocks 01/14/24: 31 blocks R hand Goal status: in progress  4.  Pt will be independent with HEPs for UE strength and FM coordination with handouts for reference prn  Baseline: New to OP OT  Goal status: in progress  5. Pt will write short paragraph with 75% legibility and mild to no micrographia Baseline:  Wrote short sentence ([place]) with 100% legible and Mild micrographia. Pt reports micrographia worsens the longer he writes. Goal status: in progress  6. Pt will demonstrate an improved average PSFS score of at least 2 points, or individual score improvement of 3 points   Baseline:   Goal status: in progress   LONG TERM GOALS: Target date: 02/18/24  Pt will write short paragraph with 100% legibility and mild to no micrographia Baseline:  Wrote short sentence ([place]) with 100% legible and Mild micrographia. Pt reports micrographia worsens the longer he writes. Goal status: in progress  2.  Pt will demonstrate improved fine motor coordination for ADLs as evidenced by decreasing 9 hole peg test score for R hand by at least 3 secs  Baseline: Right: 40.30 sec; Left: 38.43 sec Goal status: in progress  3.  Pt will demonstrate increased ease with dressing as evidenced by decreasing PPT#4 (don/ doff jacket) to 20 secs or less  Baseline: PPT#4 (donning/doffing jacket): 23.04 seconds 01/14/24: 12.18 seconds  Goal status: GOAL MET   4.  Pt will verbalize understanding of ways to prevent future PD related complications and PD community resources. Baseline: Initiated with [employer] classes Goal status: in  progress   5.  Pt will demonstrate improved FM coordination and functional use of RUE by increasing Box and Blocks score by at least 6 blocks Baseline: Right 32 blocks, Left 38 blocks 01/14/24: 31 blocks  Goal status: in progress   ASSESSMENT:  CLINICAL IMPRESSION: Patient is a 73 y.o. male who was seen today for occupational therapy tx for PD management. Pt has made improvements in PPT tests. These goals have been marked as met. Box and Blocks have decreased by 1 block. Patient will continue to benefit from skilled therapy services to better manage occupational barriers secondary to Parkinson's and improve independence and safety with ADLs and IADLs.   PERFORMANCE DEFICITS: in functional skills including ADLs, IADLs, coordination, dexterity, strength, Fine motor control, balance, cardiopulmonary status limiting function, and UE functional use, cognitive skills including safety awareness and sequencing, and psychosocial skills including coping strategies and environmental adaptation.   IMPAIRMENTS: are limiting patient from ADLs, IADLs, and leisure.     PLAN:  OT FREQUENCY: 2x/week  OT DURATION: 8 weeks  PLANNED INTERVENTIONS: 97168 OT Re-evaluation, 97535 self care/ADL training, 02889 therapeutic exercise, 97530 therapeutic activity, 97112 neuromuscular re-education, 97140 manual therapy, passive range of motion, functional mobility training, energy conservation, coping strategies training, patient/family education, and DME and/or AE instructions  RECOMMENDED OTHER SERVICES: none noted   CONSULTED AND AGREED WITH PLAN OF CARE: Patient  PLAN FOR NEXT SESSION:  Coordination activities Continue BIG movements Further AE recommendations prn    Rocky Dutch, OT 01/14/2024, 12:02 PM

## 2024-01-14 NOTE — Therapy (Signed)
 OUTPATIENT SPEECH LANGUAGE PATHOLOGY [facility]   Patient Name: Tyler [name] MRN: [id number] DOB:1950-09-05, 73 y.o., male Today's Date: 01/14/2024  PCP: Janey Santos, MD REFERRING PROVIDER: Evonnie Stabs, DO  END OF SESSION:  End of Session - 01/14/24 0935     Visit Number 6    Number of Visits 17    Date for Recertification  02/06/24    SLP Start Time 0933    SLP Stop Time  1015    SLP Time Calculation (min) 42 min    Activity Tolerance Patient tolerated treatment well              Past Medical History:  Diagnosis Date   Adenomatous polyp of colon 02/2004   Anemia    Anxiety    Arthritis    BPH (benign prostatic hyperplasia)    Colonic varices    Degenerative joint disease    Depression    Depression    Fatty liver    GERD (gastroesophageal reflux disease)    Heart murmur    Hemorrhoids    Hyperlipidemia    Hypertension    IBS (irritable bowel syndrome)    Prostate cancer (HCC) 2011   S/P minimally invasive mitral valve repair 04/11/2020   Complex valvuloplasty including artificial Gore-tex neochord placement x8 with suture plication of posterior commissure and 30 mm Sorin Memo 3D ring annuloplasty via right mini thoracotomy approach   Thrombocytopenia    Thyroid  nodule    Past Surgical History:  Procedure Laterality Date   BUBBLE STUDY  12/22/2019   Procedure: BUBBLE STUDY;  Surgeon: Barbaraann Darryle Ned, MD;  Location: [facility] ENDOSCOPY;  Service: Cardiovascular;;   CARDIAC CATHETERIZATION     COLONOSCOPY     MASS EXCISION Right 05/14/2021   Procedure: REPAIR RIGHT CHEST WALL HERNIA WITH MESH;  Surgeon: Kerrin Elspeth BROCKS, MD;  Location: [facility] OR;  Service: Thoracic;  Laterality: Right;   MITRAL VALVE REPAIR Right 04/11/2020   Procedure: MINIMALLY INVASIVE MITRAL VALVE REPAIR (MVR) USING MEMO 4D RING SIZE ;  Surgeon: Dusty Sudie DEL, MD;  Location: [facility] OR;  Service: Open Heart Surgery;  Laterality: Right;   PROSTATECTOMY     RIGHT/LEFT  HEART CATH AND CORONARY ANGIOGRAPHY N/A 12/22/2019   Procedure: RIGHT/LEFT HEART CATH AND CORONARY ANGIOGRAPHY;  Surgeon: Wonda Sharper, MD;  Location: [facility] INVASIVE CV LAB;  Service: Cardiovascular;  Laterality: N/A;   TEE WITHOUT CARDIOVERSION N/A 12/22/2019   Procedure: TRANSESOPHAGEAL ECHOCARDIOGRAM (TEE);  Surgeon: Barbaraann Darryle Ned, MD;  Location: [facility] ENDOSCOPY;  Service: Cardiovascular;  Laterality: N/A;   TEE WITHOUT CARDIOVERSION N/A 04/11/2020   Procedure: TRANSESOPHAGEAL ECHOCARDIOGRAM (TEE);  Surgeon: Dusty Sudie DEL, MD;  Location: [facility] OR;  Service: Open Heart Surgery;  Laterality: N/A;   TONSILLECTOMY     TOOTH EXTRACTION  03/07/2020   TYMPANOPLASTY  01/2016   Patient Active Problem List   Diagnosis Date Noted   Incisional hernia 05/14/2021   S/P minimally invasive mitral valve repair 04/11/2020   Mitral regurgitation 12/20/2019   VARICES OF OTHER SITES 12/30/2007   History of colonic polyps 12/25/2007    ONSET DATE: diagnosed one year ago; script 11/06/23  REFERRING DIAG: Hypophonia  THERAPY DIAG:  Dysarthria and anarthria  Dysphagia, oropharyngeal phase  Rationale for Evaluation and Treatment: Rehabilitation  SUBJECTIVE:   SUBJECTIVE STATEMENT: I had surgery on my big toe for ingrown nail on Monday.  Pt accompanied by: self  PERTINENT HISTORY: Pt dx'd with PD August 2024 with sx prior to that.  PAIN:  Are you having pain? Yes: NPRS scale: 2/10 Pain location: left toe (first) Pain description: sore Aggravating factors: walking Relieving factors: resting  FALLS: Has patient fallen in last 6 months?  See PT evaluation for details   PATIENT GOALS: Improve speech and swallowing  OBJECTIVE:  Note: Objective measures were completed at Evaluation unless otherwise noted.  MBS 12/22/23: Pt demonstrates a mild oropharyngeal dysphagia consisting of good airway protection, but poor oral efficiency and mild pharyngeal residue. Pt recommended to continue  regular diet and thin liquids with f/u with OP SLP for appropriate strategies.    Pt has inefficient oral phase with unnecessary lingual movement to gather bolus and initiate swallow as well a piecemeal transit and loss of bolus to floor of mouth and buccal cavity. When cued to do so pt can increase efficency and improve bolus cohesion to decrease oral residue and reduce number of swallows gather it and swallow it all at once. Pt also has mild pharyngeal residue with thin liquids, likely due to oral deficits and slightly decreased ROM and duration of swallow. Again with cues for an effortful swallow pt increases these measures and reduces residue. Pt did not aspirate during the study, but expect some penetration or aspiration of oral residue and pharyngeal residue pooling after bites and sips. Also noted bumpy posterior pharyngeal wall and esophageal column against cervical spine, suggesting loss of tissue mass with age/sarcopenia. Pt to f/u with OP SLP to implement strategies. Would not suggest any texture changes.   DIGEST Swallow Severity Rating*             Safety: 0             Efficiency:1             Overall Pharyngeal Swallow Severity: 1 1: mild; 2: moderate; 3: severe; 4: profound  *The Dynamic Imaging Grade of Swallowing Toxicity is standardized for the head and neck cancer population, however, demonstrates promising clinical applications across populations to standardize the clinical rating of pharyngeal swallow safety and severity.   Factors that may increase risk of adverse event in presence of aspiration Noe & Lianne 2021):     Swallow Evaluation Recommendations Recommendations: PO diet PO Diet Recommendation: Regular;Thin liquids (Level 0) Liquid Administration via: Cup;Straw Medication Administration: Whole meds with liquid Supervision: Patient able to self-feed Swallowing strategies  : effortful swallow;Avoid mixed consistencies Postural changes: Position pt fully upright  for meals;Stay upright 30-60 min after meals Oral care recommendations: Oral care BID (2x/day)  PATIENT REPORTED OUTCOME MEASURES (PROM): Communication Participation Item Bank: returned 01/12/24 and pt scored 16/30 with lower scores indicating more barriers to communication due to pt's softer speech. All answers were 1-2 (with options from 0-3).                                                                                                                            TREATMENT DATE:   01/14/24: Pt needs swallow HEP done  next session.  SPEECH: SLP asked Clancey to cont to look at his schedule and let SLP know how this strategy is going next visit about keeping better organized. Pt's average with loud /a/ was 87dB, and pt wrote 4 more everyday sentences; Average today with these was 79dB. In two conversation segments, one of 8 minutes, one of 9 minutes, and one of 12 minutes pt maintained WNL volume. He noted that being in the ST room, with the sound level meter made it easier to concentrate on loudness and stated he needed to do better at home with increasing his loudness.   11/10/25BETHA Marty told SLP that he feels like he is not as organized with his days as he was previously and wonders if PD has lessened his skills with executive function. SLP suggested he sit down with his medical appointments and to-do list and spend ~5 minutes thinking through his next day. He thanked SLP for this suggestion. With loud /a/ pt's average was 87dB, and SLP assisted pt in developing everyday sentences. He was told to complete 4-5 more sentences until next session.  Average today with these was 77dB and SLP had pt incr loudness and he did, to average 79dB.   01/07/24: CPIB was not returned today. Will bring next time.  SWALLOW: Pt states the HEP for swallowing is difficult. In light of this SLP reviewed pt's HEP with him today. Pt stated his confidence in performing the HEP was better, from 8-9/10 (where 10=100% confident  about procedure). SPEECH: SLP modeled loud /a/ for pt and provided rationale for loud /a/, and he generated loud /a/ average 86dB. Pt has not been successful with routine completion with these. SLP provided some examples of how to accomplish that. SLP reviewed the rationale for reading out loud. With reading sentences, pt's average was 74dB with initial min A initially. SLP progressed into paragraphs and pt loudness was 71dB with initial cues for intent, deliberate, purposeful speech. For homework, SLP told pt to read out loud for 30 seconds x10 after loud /a/.   01/05/24: CPIB provided to pt today. Discussed/reviewed MBS with pt today. Stressed gather and swallow concept for each swallow was much like swallowing with more intent/purpose like SLP and pt had discussed about his speech in previous session. SLP assisted pt with practice with gather and swallow concept with swallows of water. Pt with more effort with swallow on the swallows he thought gather and swallow was more salient for him. SLP then developed HEP for pt and SLP assessed pt's procedure with these exercises and corrected PRN. Pt to complete at least 5 days/week with 30 reps/day.   12/22/23: NEEDS CPIB NEXT SESSION. Explained the nature of fluoroscopy with pt, and that this machine will record his swallowing with food textures and liquids. Explained about dB being a logarithmic scale. SLP shaped pt's loud /a/ to average 82dB, started at 73dB. Pt read sentences and req'd SLP usual mod-max cues to use intent and purpose with effortful voice. Digital recording provided beneficial auditory feedback of pt's productions. Homework to read out loud 30 seconds x10, and perform 10 loud /a/, BID.  12/09/23: n/a  PATIENT EDUCATION: Education details: See treatment date Person educated: Patient Education method: Explanation, Demonstration, Verbal cues, and Handouts Education comprehension: verbalized understanding, returned demonstration, verbal  cues required, and needs further education  HOME EXERCISE PROGRAM: HEP for louder speech, HEP for dysphagia   GOALS: Goals reviewed with patient? Yes, generally  SHORT TERM GOALS: Target date:  01/26/24 (  due to visit count)  Pt will undergo MBS if clinically indicated Baseline: Goal status: met  2.  Pt will demo 18/20 sentences with WNL volume Baseline:  Goal status: met  3.  Pt will demo WNL volume in 5 minutes conversation Baseline:  Goal status: met  4.  Pt will complete dysphagia HEP (if indicated following MBS) with rare min A Baseline:  Goal status: INITIAL   LONG TERM GOALS: Target date: 02/06/24  Pt will have improved PROM Baseline:  Goal status: INITIAL  2.  Pt will demo WNL volume in 10 minutes conversation, in 3 sessions Baseline:  Goal status: modified  3.  Pt will complete dysphagia HEP (if indicated following MBS) with mod I in 3 sessions Baseline:  Goal status: INITIAL  4.  Pt will demo knowledge about where to find reputable info about speech/language info with Parkinson's on the internet Baseline:  Goal status: INITIAL   ASSESSMENT:  CLINICAL IMPRESSION: SLP modified LTG #2 today. Patient is a 73 y.o. M who was seen today for treatment of swallowing and/or speech and language in light of PD. See treatment date above for today's date for further details on today's session. Pt is making good gains on improving loudness.. On date of eval he demonstrated incr'd volume and intent with short conversation today proving to be a good candidate for skilled ST. Wife states pt's volume is such that she has to ask him to repeat more often than she had to 5-6 years ago.    OBJECTIVE IMPAIRMENTS: Objective impairments include dysarthria and dysphagia. These impairments are limiting patient from household responsibilities, ADLs/IADLs, effectively communicating at home and in community, and safety when swallowing.Factors affecting potential to achieve goals and  functional outcome are none noted today. Patient will benefit from skilled SLP services to address above impairments and improve overall function.  REHAB POTENTIAL: Good  PLAN:  SLP FREQUENCY: 2x/week  SLP DURATION: 8 weeks  PLANNED INTERVENTIONS: Aspiration precaution training, Pharyngeal strengthening exercises, Diet toleration management , Cueing hierachy, Internal/external aids, Oral motor exercises, Functional tasks, SLP instruction and feedback, Compensatory strategies, Patient/family education, [phone] Treatment of speech (30 or 45 min) , and [id number] Treatment of swallowing function    Jaideep Pollack, CCC-SLP 01/14/2024, 9:35 AM

## 2024-01-14 NOTE — Therapy (Signed)
 OUTPATIENT PHYSICAL THERAPY NEURO TREATMENT NOTE   Patient Name: Tyler [name] MRN: [id number] DOB:[birth year], 73 y.o., male Today's Date: 01/14/2024   PCP: Janey Santos, MD REFERRING PROVIDER: Evonnie Asberry RAMAN, DO  END OF SESSION:  PT End of Session - 01/14/24 1005     Visit Number 9    Number of Visits 14    Date for Recertification  02/10/24    Authorization Type [employer]    PT Start Time 1015    PT Stop Time 1100    PT Time Calculation (min) 45 min    Equipment Utilized During Treatment --   close supervision   Activity Tolerance Patient tolerated treatment well    Behavior During Therapy [facility] for tasks assessed/performed              Past Medical History:  Diagnosis Date   Adenomatous polyp of colon 02/2004   Anemia    Anxiety    Arthritis    BPH (benign prostatic hyperplasia)    Colonic varices    Degenerative joint disease    Depression    Depression    Fatty liver    GERD (gastroesophageal reflux disease)    Heart murmur    Hemorrhoids    Hyperlipidemia    Hypertension    IBS (irritable bowel syndrome)    Prostate cancer (HCC) 2011   S/P minimally invasive mitral valve repair 04/11/2020   Complex valvuloplasty including artificial Gore-tex neochord placement x8 with suture plication of posterior commissure and 30 mm Sorin Memo 3D ring annuloplasty via right mini thoracotomy approach   Thrombocytopenia    Thyroid  nodule    Past Surgical History:  Procedure Laterality Date   BUBBLE STUDY  12/22/2019   Procedure: BUBBLE STUDY;  Surgeon: Barbaraann Darryle Ned, MD;  Location: [facility] ENDOSCOPY;  Service: Cardiovascular;;   CARDIAC CATHETERIZATION     COLONOSCOPY     MASS EXCISION Right 05/14/2021   Procedure: REPAIR RIGHT CHEST WALL HERNIA WITH MESH;  Surgeon: Kerrin Elspeth BROCKS, MD;  Location: [facility] OR;  Service: Thoracic;  Laterality: Right;   MITRAL VALVE REPAIR Right 04/11/2020   Procedure: MINIMALLY INVASIVE MITRAL VALVE REPAIR (MVR)  USING MEMO 4D RING SIZE ;  Surgeon: Dusty Sudie DEL, MD;  Location: [facility] OR;  Service: Open Heart Surgery;  Laterality: Right;   PROSTATECTOMY     RIGHT/LEFT HEART CATH AND CORONARY ANGIOGRAPHY N/A 12/22/2019   Procedure: RIGHT/LEFT HEART CATH AND CORONARY ANGIOGRAPHY;  Surgeon: Wonda Sharper, MD;  Location: [facility] INVASIVE CV LAB;  Service: Cardiovascular;  Laterality: N/A;   TEE WITHOUT CARDIOVERSION N/A 12/22/2019   Procedure: TRANSESOPHAGEAL ECHOCARDIOGRAM (TEE);  Surgeon: Barbaraann Darryle Ned, MD;  Location: [facility] ENDOSCOPY;  Service: Cardiovascular;  Laterality: N/A;   TEE WITHOUT CARDIOVERSION N/A 04/11/2020   Procedure: TRANSESOPHAGEAL ECHOCARDIOGRAM (TEE);  Surgeon: Dusty Sudie DEL, MD;  Location: [facility] OR;  Service: Open Heart Surgery;  Laterality: N/A;   TONSILLECTOMY     TOOTH EXTRACTION  03/07/2020   TYMPANOPLASTY  01/2016   Patient Active Problem List   Diagnosis Date Noted   Incisional hernia 05/14/2021   S/P minimally invasive mitral valve repair 04/11/2020   Mitral regurgitation 12/20/2019   VARICES OF OTHER SITES 12/30/2007   History of colonic polyps 12/25/2007    ONSET DATE: August 2024  REFERRING DIAG:  G20.A1 (ICD-10-CM) - Parkinson's disease without dyskinesia or fluctuating manifestations (HCC)    THERAPY DIAG:  Muscle weakness (generalized)  Unsteadiness on feet  Other abnormalities of gait and  mobility  Parkinson's disease without dyskinesia or fluctuating manifestations (HCC)  Rationale for Evaluation and Treatment: Rehabilitation  SUBJECTIVE:                                                                                                                                                                                             SUBJECTIVE STATEMENT: Toe is sore from surgery on Monday, sore to walk, not so bad in standing Pt accompanied by: self  PERTINENT HISTORY: hx of mitral valve replacement, tendon problem in R shoulder (hx of this, tendon  detached from shoulder), hx of L RTC and R RTC issues  PAIN:  Are you having pain? No  PRECAUTIONS: None  RED FLAGS: None   WEIGHT BEARING RESTRICTIONS:   FALLS: Has patient fallen in last 6 months? one fall a couple weeks ago on his treadmill  LIVING ENVIRONMENT: Lives with: lives with their spouse Lives in: House/apartment Stairs: stairs inside/outside, does have chair lift available if needed Has following equipment at home: None  PLOF: Independent  Exercise routine at home:  treadmill at home x 30 minutes, light weights, and some balance exercises 3-4 days per week.  PATIENT GOALS: balance  OBJECTIVE:   TODAY'S TREATMENT: 01/14/24 Activity Comments  LE PRE 10# ankle weights -LAQ 5x10, -hip abd 2x10,  -donkey kick 2x10 -stair taps w/ alt overhead press 2x10, 8 red med ball  Sidestepping x 2 min 10#  Balance/coordination Standing on foam: - throw/catch playground ball, red med ball -halos w/ red ball x 60 sec around the head/body/around knees -alt toe taps x 60 sec 12 -rockerboard 1x60 sec-still and opposite head movements -4 square step, 6-12 hurdles               TODAY'S TREATMENT: 01/12/2024 Activity Comments  Dynamic balance/gait warm up: -forward walking x 2 minutes with focus on step length/arm swing -Forward/back walking x 1:30 -Sidestepping with added arm motions -Marching with opposite arm lifts x 1:30   On Airex: Forward step and weightshift x 10 Side step and weightshift x 10 Back step and weightshift x 10 Forward/back step and weightshift x 10 Feet apart/feet together EO and EC head turns/nods x 5 reps each   On incline/decline:  marching in place Forward/back stepping Feet apart/feet together EO/EC                    Access Code: [email or web address] URL: [email or web address] Date: 12/17/2023, 12/24/2023 Prepared by: [facility] - [facility]  *PWR MOVES STANDING, 10-20 reps 1x/day Exercises -  Alternating Step Backward with Support  - 1 x daily -  7 x weekly - 1-2 sets - 10 reps - Goblet Squat with Kettlebell  - 2-3 x weekly - 3 sets - 10 reps - Sumo Squat with Dumbbell  - 2-3 x weekly - 3 sets - 10 reps - Reverse Lunge  - 2-3 x weekly - 3 sets - 10 reps - Kettlebell Suitcase Carry  - 2-3 x weekly - Standing Bent Over Single Arm Shoulder Row  - 2-3 x weekly - 3 sets - 10 reps  PATIENT EDUCATION: Education details: 11/10:  Continue current HEP, discussion of current exercise program and options for community fitness (pt may be looking into cycling at [facility], [employer], or [employer]) Person educated: Patient Education method: Explanation, Demonstration, and Handouts Education comprehension: verbalized understanding, returned demonstration, and needs further education  ---------------------------------- Note: Objective measures were completed at Evaluation unless otherwise noted.  DIAGNOSTIC FINDINGS:   COGNITION: Overall cognitive status: Within functional limits for tasks assessed   SENSATION: WFL  COORDINATION: intact  EDEMA:  none  MUSCLE TONE: slight, 1+ bilateral biceps  MUSCLE LENGTH: WNL  DTRs:  NT  POSTURE: No Significant postural limitations  LOWER EXTREMITY ROM:     WNL  LOWER EXTREMITY MMT:    5/5  BED MOBILITY:  indep  TRANSFERS: indep    CURB:  Findings: indep  STAIRS: Not tested--reports independent but relies on HR for security GAIT: Findings: Comments: no deviations, decreased speed  FUNCTIONAL TESTS:  5 times sit to stand: 11 sec 10 meter walk test: 12.13 sec = 2.7 ft/sec Mini-BESTest: 22/28 : 5.3 sec                                                                                                                                TREATMENT DATE: 12/09/23    PATIENT EDUCATION: Education details: discussed relevant exercise intervention strategies/methods for tx of PD, initiated discussion of group classes in  community, rationale of interventions Person educated: Patient and Spouse Education method: Explanation Education comprehension: needs further education  HOME EXERCISE PROGRAM: TBD  GOALS: Goals reviewed with patient? Yes  SHORT TERM GOALS: Target date: 01/06/2024    Patient will be independent in HEP to improve functional outcomes Baseline: Goal status: MET, 01/06/2024  2.  Demo reduced risk for falls per time 4.8 sec 3-Meter Backwards Walk Test Baseline: 5.3 sec>6.37 sec 01/06/2024 Goal status: IN PROGRESS, 01/06/2024  3.  Demo ability to sustain speed of 3.25 ft/sec during ambulation to improve community mobility Baseline: 2.7 ft/sec>4.58 ft/sec Goal status: MET, 01/06/2024   LONG TERM GOALS: Target date: 02/10/2024    Pt to verbalize understanding or participation in community-based classes for people with Parkinson's to improve activity carryover Baseline:  Goal status: IN PROGRESS  2.  Demo improved balance and reduced fall risk per score 25/28 Mini-BESTest Baseline: 22/28 Goal status: IN PROGRESS  3.  Patient to teach-back metrics for defining and executing high intensity training Baseline:  Goal status: IN  PROGRESS   ASSESSMENT:  CLINICAL IMPRESSION: Activities modified today due to toe pain from recent surgery.  Focus on isolated strength exercises w/ open chain bias to improve activity tolerance and control. Static and dynamic multisensory balance activities to facilitate weight shift, change of direction, single leg stance, postural perturbations.  Good activity tolerance throughout and no overt LOB during balance challenges.   OBJECTIVE IMPAIRMENTS: decreased activity tolerance, decreased balance, decreased endurance, decreased knowledge of condition, difficulty walking, and impaired tone.   ACTIVITY LIMITATIONS: carrying, lifting, stairs, and locomotion level  PARTICIPATION LIMITATIONS: community activity, yard work, and exercise routine  PERSONAL  FACTORS: Age, Time since onset of injury/illness/exacerbation, and 1 comorbidity: PMH are also affecting patient's functional outcome.   REHAB POTENTIAL: Excellent  CLINICAL DECISION MAKING: Evolving/moderate complexity  EVALUATION COMPLEXITY: Moderate  PLAN:  PT FREQUENCY: 2x/wk x 4 wks, then 1x/wk x 4 wks  PT DURATION: 8 weeks  PLANNED INTERVENTIONS: 97750- Physical Performance Testing, 97110-Therapeutic exercises, 97530- Therapeutic activity, [id number]- Neuromuscular re-education, 97535- Self Care, [id number]- Manual therapy, [id number]- Gait training, [phone]- Canalith repositioning, [phone]- Aquatic Therapy, and Patient/Family education  PLAN FOR NEXT SESSION: Work on step strategy varied directions, compliant surface balance, high intensity interval training, gait activities and relevant metrics for self-monitoring.  Continue to discuss plans for joining community fitness class(es) upon d/c from PT  11:00 AM, 01/14/24 M. Kelly Demitria Hay, PT, DPT Physical Therapist- [place] Office Number: [phone]

## 2024-01-14 NOTE — Progress Notes (Signed)
 Subjective:   Patient ID: Tyler [name], male   DOB: 73 y.o.   MRN: [id number]   HPI Chief Complaint  Patient presents with   Nail Problem    Rm 13 Patient is here for ingrown toe nail of the left hallux ( lateral side). Symptoms have been present for 1 month.    73 year old male presents the office today with concerns of pain to his left big toenail, ingrown toenail.  Majority discomfort is on the lateral side there is ingrowing of both corners.  There is currently no drainage or pus.  The area is tender with pressure.  No recent treatment otherwise.  No other concerns.   Review of Systems  All other systems reviewed and are negative.  Past Medical History:  Diagnosis Date   Adenomatous polyp of colon 02/2004   Anemia    Anxiety    Arthritis    BPH (benign prostatic hyperplasia)    Colonic varices    Degenerative joint disease    Depression    Depression    Fatty liver    GERD (gastroesophageal reflux disease)    Heart murmur    Hemorrhoids    Hyperlipidemia    Hypertension    IBS (irritable bowel syndrome)    Prostate cancer (HCC) 2011   S/P minimally invasive mitral valve repair 04/11/2020   Complex valvuloplasty including artificial Gore-tex neochord placement x8 with suture plication of posterior commissure and 30 mm Sorin Memo 3D ring annuloplasty via right mini thoracotomy approach   Thrombocytopenia    Thyroid  nodule     Past Surgical History:  Procedure Laterality Date   BUBBLE STUDY  12/22/2019   Procedure: BUBBLE STUDY;  Surgeon: Barbaraann Darryle Ned, MD;  Location: [facility] ENDOSCOPY;  Service: Cardiovascular;;   CARDIAC CATHETERIZATION     COLONOSCOPY     MASS EXCISION Right 05/14/2021   Procedure: REPAIR RIGHT CHEST WALL HERNIA WITH MESH;  Surgeon: Kerrin Elspeth BROCKS, MD;  Location: [facility] OR;  Service: Thoracic;  Laterality: Right;   MITRAL VALVE REPAIR Right 04/11/2020   Procedure: MINIMALLY INVASIVE MITRAL VALVE REPAIR (MVR) USING MEMO 4D RING SIZE ;   Surgeon: Dusty Sudie DEL, MD;  Location: [facility] OR;  Service: Open Heart Surgery;  Laterality: Right;   PROSTATECTOMY     RIGHT/LEFT HEART CATH AND CORONARY ANGIOGRAPHY N/A 12/22/2019   Procedure: RIGHT/LEFT HEART CATH AND CORONARY ANGIOGRAPHY;  Surgeon: Wonda Sharper, MD;  Location: [facility] INVASIVE CV LAB;  Service: Cardiovascular;  Laterality: N/A;   TEE WITHOUT CARDIOVERSION N/A 12/22/2019   Procedure: TRANSESOPHAGEAL ECHOCARDIOGRAM (TEE);  Surgeon: Barbaraann Darryle Ned, MD;  Location: [facility] ENDOSCOPY;  Service: Cardiovascular;  Laterality: N/A;   TEE WITHOUT CARDIOVERSION N/A 04/11/2020   Procedure: TRANSESOPHAGEAL ECHOCARDIOGRAM (TEE);  Surgeon: Dusty Sudie DEL, MD;  Location: [facility] OR;  Service: Open Heart Surgery;  Laterality: N/A;   TONSILLECTOMY     TOOTH EXTRACTION  03/07/2020   TYMPANOPLASTY  01/2016     Current Outpatient Medications:    amoxicillin  (AMOXIL ) 500 MG capsule, TAKE 4 CAPSULES 30-60 MINUTES BEFORE DENTAL PROCEDURES, Disp: 4 capsule, Rfl: 1   aspirin  EC 81 MG tablet, Take 1 tablet (81 mg total) by mouth daily. Swallow whole., Disp: 90 tablet, Rfl: 3   buPROPion  (WELLBUTRIN  XL) 300 MG 24 hr tablet, Take 300 mg by mouth every morning., Disp: , Rfl:    carbidopa -levodopa  (SINEMET  IR) 25-100 MG tablet, TAKE 1 TABLET BY MOUTH 3 (THREE) TIMES DAILY. 7AM/11AM/4PM, Disp: 270 tablet, Rfl: 0  cephALEXin (KEFLEX) 500 MG capsule, Take 1 capsule (500 mg total) by mouth 3 (three) times daily., Disp: 21 capsule, Rfl: 0   dicyclomine  (BENTYL ) 10 MG capsule, Take 1 capsule (10 mg total) by mouth daily as needed for spasms (or abdominal pain/cramping). (Patient taking differently: Take 10 mg by mouth 4 (four) times daily -  before meals and at bedtime.), Disp: , Rfl:    escitalopram  (LEXAPRO ) 5 MG tablet, Take 5 mg by mouth every evening. , Disp: , Rfl:    fluticasone  (FLONASE ) 50 MCG/ACT nasal spray, Place 1 spray into both nostrils daily as needed for allergies., Disp: , Rfl:    hydroxypropyl  methylcellulose / hypromellose (ISOPTO TEARS / GONIOVISC) 2.5 % ophthalmic solution, Place 1 drop into both eyes 3 (three) times daily as needed (for dryness). , Disp: , Rfl:    lisinopril  (ZESTRIL ) 2.5 MG tablet, TAKE 1 TABLET BY MOUTH EVERY DAY, Disp: 15 tablet, Rfl: 0   pantoprazole  (PROTONIX ) 40 MG tablet, Take 1 tablet (40 mg total) by mouth daily before breakfast., Disp: , Rfl:    propranolol (INDERAL) 60 MG tablet, Take 60 mg by mouth daily., Disp: , Rfl:    rosuvastatin  (CRESTOR ) 5 MG tablet, TAKE 1 TABLET (5 MG TOTAL) BY MOUTH DAILY., Disp: 30 tablet, Rfl: 0   sildenafil (VIAGRA) 100 MG tablet, Take 100 mg by mouth daily as needed for erectile dysfunction., Disp: , Rfl:    traZODone  (DESYREL ) 50 MG tablet, Take 50 mg by mouth at bedtime., Disp: , Rfl:   No Known Allergies        Objective:  Physical Exam  General: AAO x3, NAD  Dermatological: Incurvation present left hallux toenail localized edema and erythema more from inflammation as opposed to infection.  There is no drainage or pus.  There is no ascending cellulitis.  No open lesions.  Vascular: Dorsalis Pedis artery and Posterior Tibial artery pedal pulses are 2/4 bilateral with immedate capillary fill time. There is no pain with calf compression, swelling, warmth, erythema.   Neruologic: Grossly intact via light touch bilateral.   Musculoskeletal: There is no ingrown toenail.       Assessment:   Ingrown toenail left hallux     Plan:  -Treatment options discussed including all alternatives, risks, and complications -Etiology of symptoms were discussed -At this time, the patient is requesting partial nail removal with chemical matricectomy to the symptomatic portion of the nail. Risks and complications were discussed with the patient for which they understand and written consent was obtained. Under sterile conditions a total of 3 mL of a mixture of 2% lidocaine  plain and 0.5% Marcaine  plain was infiltrated in a  hallux block fashion. Once anesthetized, the skin was prepped in sterile fashion. A tourniquet was then applied. Next the symptomatic borders of the hallux nail border was then sharply excised making sure to remove the entire offending nail border. Once the nails were ensured to be removed area was debrided and the underlying skin was intact. There is no purulence identified in the procedure. Next phenol was then applied under standard conditions and copiously irrigated.  Silvadene was applied. A dry sterile dressing was applied. After application of the dressing the tourniquet was removed and there is found to be an immediate capillary refill time to the digit. The patient tolerated the procedure well any complications. Post procedure instructions were discussed the patient for which he verbally understood. Discussed signs/symptoms of infection and directed to call the office immediately should any occur  or go directly to the emergency room. In the meantime, encouraged to call the office with any questions, concerns, changes symptoms. - Cephalexin  Return in about 2 weeks (around 01/26/2024), or if symptoms worsen or fail to improve, for nail check .  Donnice JONELLE Fees DPM

## 2024-01-17 ENCOUNTER — Other Ambulatory Visit: Payer: Self-pay | Admitting: Neurology

## 2024-01-19 ENCOUNTER — Encounter: Payer: Self-pay | Admitting: Physical Therapy

## 2024-01-19 ENCOUNTER — Ambulatory Visit: Admitting: Occupational Therapy

## 2024-01-19 ENCOUNTER — Ambulatory Visit

## 2024-01-19 ENCOUNTER — Other Ambulatory Visit: Payer: Self-pay | Admitting: Cardiovascular Disease

## 2024-01-19 ENCOUNTER — Ambulatory Visit: Admitting: Physical Therapy

## 2024-01-19 DIAGNOSIS — M6281 Muscle weakness (generalized): Secondary | ICD-10-CM

## 2024-01-19 DIAGNOSIS — R2681 Unsteadiness on feet: Secondary | ICD-10-CM

## 2024-01-19 DIAGNOSIS — R2689 Other abnormalities of gait and mobility: Secondary | ICD-10-CM

## 2024-01-19 DIAGNOSIS — R1312 Dysphagia, oropharyngeal phase: Secondary | ICD-10-CM

## 2024-01-19 DIAGNOSIS — R471 Dysarthria and anarthria: Secondary | ICD-10-CM

## 2024-01-19 DIAGNOSIS — R278 Other lack of coordination: Secondary | ICD-10-CM

## 2024-01-19 NOTE — Therapy (Signed)
 OUTPATIENT SPEECH LANGUAGE PATHOLOGY [facility]   Patient Name: Tyler [name] MRN: [id number] DOB:06-12-1950, 73 y.o., male Today's Date: 01/19/2024  PCP: Janey Santos, MD REFERRING PROVIDER: Evonnie Stabs, DO  END OF SESSION:  End of Session - 01/19/24 1109     Visit Number 7    Number of Visits 17    Date for Recertification  02/06/24    SLP Start Time 1105    SLP Stop Time  1145    SLP Time Calculation (min) 40 min    Activity Tolerance Patient tolerated treatment well              Past Medical History:  Diagnosis Date   Adenomatous polyp of colon 02/2004   Anemia    Anxiety    Arthritis    BPH (benign prostatic hyperplasia)    Colonic varices    Degenerative joint disease    Depression    Depression    Fatty liver    GERD (gastroesophageal reflux disease)    Heart murmur    Hemorrhoids    Hyperlipidemia    Hypertension    IBS (irritable bowel syndrome)    Prostate cancer (HCC) 2011   S/P minimally invasive mitral valve repair 04/11/2020   Complex valvuloplasty including artificial Gore-tex neochord placement x8 with suture plication of posterior commissure and 30 mm Sorin Memo 3D ring annuloplasty via right mini thoracotomy approach   Thrombocytopenia    Thyroid  nodule    Past Surgical History:  Procedure Laterality Date   BUBBLE STUDY  12/22/2019   Procedure: BUBBLE STUDY;  Surgeon: Barbaraann Darryle Ned, MD;  Location: [facility] ENDOSCOPY;  Service: Cardiovascular;;   CARDIAC CATHETERIZATION     COLONOSCOPY     MASS EXCISION Right 05/14/2021   Procedure: REPAIR RIGHT CHEST WALL HERNIA WITH MESH;  Surgeon: Kerrin Elspeth BROCKS, MD;  Location: [facility] OR;  Service: Thoracic;  Laterality: Right;   MITRAL VALVE REPAIR Right 04/11/2020   Procedure: MINIMALLY INVASIVE MITRAL VALVE REPAIR (MVR) USING MEMO 4D RING SIZE ;  Surgeon: Dusty Sudie DEL, MD;  Location: [facility] OR;  Service: Open Heart Surgery;  Laterality: Right;   PROSTATECTOMY     RIGHT/LEFT  HEART CATH AND CORONARY ANGIOGRAPHY N/A 12/22/2019   Procedure: RIGHT/LEFT HEART CATH AND CORONARY ANGIOGRAPHY;  Surgeon: Wonda Sharper, MD;  Location: [facility] INVASIVE CV LAB;  Service: Cardiovascular;  Laterality: N/A;   TEE WITHOUT CARDIOVERSION N/A 12/22/2019   Procedure: TRANSESOPHAGEAL ECHOCARDIOGRAM (TEE);  Surgeon: Barbaraann Darryle Ned, MD;  Location: [facility] ENDOSCOPY;  Service: Cardiovascular;  Laterality: N/A;   TEE WITHOUT CARDIOVERSION N/A 04/11/2020   Procedure: TRANSESOPHAGEAL ECHOCARDIOGRAM (TEE);  Surgeon: Dusty Sudie DEL, MD;  Location: [facility] OR;  Service: Open Heart Surgery;  Laterality: N/A;   TONSILLECTOMY     TOOTH EXTRACTION  03/07/2020   TYMPANOPLASTY  01/2016   Patient Active Problem List   Diagnosis Date Noted   Incisional hernia 05/14/2021   S/P minimally invasive mitral valve repair 04/11/2020   Mitral regurgitation 12/20/2019   VARICES OF OTHER SITES 12/30/2007   History of colonic polyps 12/25/2007    ONSET DATE: diagnosed one year ago; script 11/06/23  REFERRING DIAG: Hypophonia  THERAPY DIAG:  Dysarthria and anarthria  Dysphagia, oropharyngeal phase  Rationale for Evaluation and Treatment: Rehabilitation  SUBJECTIVE:   SUBJECTIVE STATEMENT: My toe still hurts a bit.  Pt accompanied by: self  PERTINENT HISTORY: Pt dx'd with PD August 2024 with sx prior to that.  PAIN:  Are you having pain?  Yes: NPRS scale: 2/10 Pain location: left toe (first) Pain description: sore Aggravating factors: walking Relieving factors: resting  FALLS: Has patient fallen in last 6 months?  See PT evaluation for details   PATIENT GOALS: Improve speech and swallowing  OBJECTIVE:  Note: Objective measures were completed at Evaluation unless otherwise noted.  MBS 12/22/23: Pt demonstrates a mild oropharyngeal dysphagia consisting of good airway protection, but poor oral efficiency and mild pharyngeal residue. Pt recommended to continue regular diet and thin liquids with  f/u with OP SLP for appropriate strategies.    Pt has inefficient oral phase with unnecessary lingual movement to gather bolus and initiate swallow as well a piecemeal transit and loss of bolus to floor of mouth and buccal cavity. When cued to do so pt can increase efficency and improve bolus cohesion to decrease oral residue and reduce number of swallows gather it and swallow it all at once. Pt also has mild pharyngeal residue with thin liquids, likely due to oral deficits and slightly decreased ROM and duration of swallow. Again with cues for an effortful swallow pt increases these measures and reduces residue. Pt did not aspirate during the study, but expect some penetration or aspiration of oral residue and pharyngeal residue pooling after bites and sips. Also noted bumpy posterior pharyngeal wall and esophageal column against cervical spine, suggesting loss of tissue mass with age/sarcopenia. Pt to f/u with OP SLP to implement strategies. Would not suggest any texture changes.   DIGEST Swallow Severity Rating*             Safety: 0             Efficiency:1             Overall Pharyngeal Swallow Severity: 1 1: mild; 2: moderate; 3: severe; 4: profound  *The Dynamic Imaging Grade of Swallowing Toxicity is standardized for the head and neck cancer population, however, demonstrates promising clinical applications across populations to standardize the clinical rating of pharyngeal swallow safety and severity.   Factors that may increase risk of adverse event in presence of aspiration Noe & Lianne 2021):     Swallow Evaluation Recommendations Recommendations: PO diet PO Diet Recommendation: Regular;Thin liquids (Level 0) Liquid Administration via: Cup;Straw Medication Administration: Whole meds with liquid Supervision: Patient able to self-feed Swallowing strategies  : effortful swallow;Avoid mixed consistencies Postural changes: Position pt fully upright for meals;Stay upright 30-60 min  after meals Oral care recommendations: Oral care BID (2x/day)  PATIENT REPORTED OUTCOME MEASURES (PROM): Communication Participation Item Bank: returned 01/12/24 and pt scored 16/30 with lower scores indicating more barriers to communication due to pt's softer speech. All answers were 1-2 (with options from 0-3).                                                                                                                            TREATMENT DATE:   01/19/24: SWALLOW: SLP guided pt through his swallowing HEP. Benefited from visual  cues of a mirror for [employer]; pt agreed. Masako req'd min A faded to independent. Initial min cues for effortful and then independent. With POs, pt req'd cues for not taking a sip with a solid bolus  but just enough liquid to wet the solid bolus (no extra water in the mouth), min A faded to independent with precautions.  SPEECH: SLP guided pt through his HEP for louder more articulate speech. Pt's loud /a/ averaged 85dB, and his sentences were 81dB prior to SLP mod cues for loudness; then they increased to 83dB. Pt was told to cont with his HEP.   01/14/24: Pt needs swallow HEP done next session.  SPEECH: SLP asked Kip to cont to look at his schedule and let SLP know how this strategy is going next visit about keeping better organized. Pt's average with loud /a/ was 87dB, and pt wrote 4 more everyday sentences; Average today with these was 79dB. In two conversation segments, one of 8 minutes, one of 9 minutes, and one of 12 minutes pt maintained WNL volume. He noted that being in the ST room, with the sound level meter made it easier to concentrate on loudness and stated he needed to do better at home with increasing his loudness.   11/10/25BETHA Marty told SLP that he feels like he is not as organized with his days as he was previously and wonders if PD has lessened his skills with executive function. SLP suggested he sit down with his medical appointments and to-do list  and spend ~5 minutes thinking through his next day. He thanked SLP for this suggestion. With loud /a/ pt's average was 87dB, and SLP assisted pt in developing everyday sentences. He was told to complete 4-5 more sentences until next session.  Average today with these was 77dB and SLP had pt incr loudness and he did, to average 79dB.   01/07/24: CPIB was not returned today. Will bring next time.  SWALLOW: Pt states the HEP for swallowing is difficult. In light of this SLP reviewed pt's HEP with him today. Pt stated his confidence in performing the HEP was better, from 8-9/10 (where 10=100% confident about procedure). SPEECH: SLP modeled loud /a/ for pt and provided rationale for loud /a/, and he generated loud /a/ average 86dB. Pt has not been successful with routine completion with these. SLP provided some examples of how to accomplish that. SLP reviewed the rationale for reading out loud. With reading sentences, pt's average was 74dB with initial min A initially. SLP progressed into paragraphs and pt loudness was 71dB with initial cues for intent, deliberate, purposeful speech. For homework, SLP told pt to read out loud for 30 seconds x10 after loud /a/.   01/05/24: CPIB provided to pt today. Discussed/reviewed MBS with pt today. Stressed gather and swallow concept for each swallow was much like swallowing with more intent/purpose like SLP and pt had discussed about his speech in previous session. SLP assisted pt with practice with gather and swallow concept with swallows of water. Pt with more effort with swallow on the swallows he thought gather and swallow was more salient for him. SLP then developed HEP for pt and SLP assessed pt's procedure with these exercises and corrected PRN. Pt to complete at least 5 days/week with 30 reps/day.   12/22/23: NEEDS CPIB NEXT SESSION. Explained the nature of fluoroscopy with pt, and that this machine will record his swallowing with food textures and liquids.  Explained about dB being a logarithmic scale. SLP shaped pt's  loud /a/ to average 82dB, started at 73dB. Pt read sentences and req'd SLP usual mod-max cues to use intent and purpose with effortful voice. Digital recording provided beneficial auditory feedback of pt's productions. Homework to read out loud 30 seconds x10, and perform 10 loud /a/, BID.  12/09/23: n/a  PATIENT EDUCATION: Education details: See treatment date Person educated: Patient Education method: Explanation, Demonstration, Verbal cues, and Handouts Education comprehension: verbalized understanding, returned demonstration, verbal cues required, and needs further education  HOME EXERCISE PROGRAM: HEP for louder speech, HEP for dysphagia   GOALS: Goals reviewed with patient? Yes, generally  SHORT TERM GOALS: Target date:  01/26/24 (due to visit count)  Pt will undergo MBS if clinically indicated Baseline: Goal status: met  2.  Pt will demo 18/20 sentences with WNL volume Baseline:  Goal status: met  3.  Pt will demo WNL volume in 5 minutes conversation Baseline:  Goal status: met  4.  Pt will complete dysphagia HEP (if indicated following MBS) with rare min A Baseline:  Goal status: INITIAL   LONG TERM GOALS: Target date: 02/06/24  Pt will have improved PROM Baseline:  Goal status: INITIAL  2.  Pt will demo WNL volume in 10 minutes conversation, in 3 sessions Baseline:  Goal status: modified  3.  Pt will complete dysphagia HEP (if indicated following MBS) with mod I in 3 sessions Baseline:  Goal status: INITIAL  4.  Pt will demo knowledge about where to find reputable info about speech/language info with Parkinson's on the internet Baseline:  Goal status: INITIAL   ASSESSMENT:  CLINICAL IMPRESSION: SLP modified LTG #2 today. Patient is a 73 y.o. M who was seen today for treatment of swallowing and/or speech and language in light of PD. See treatment date above for today's date for further  details on today's session. Pt cont making good gains on improving loudness.. On date of eval he demonstrated incr'd volume and intent with short conversation today proving to be a good candidate for skilled ST. Wife states pt's volume is such that she has to ask him to repeat more often than she had to 5-6 years ago.    OBJECTIVE IMPAIRMENTS: Objective impairments include dysarthria and dysphagia. These impairments are limiting patient from household responsibilities, ADLs/IADLs, effectively communicating at home and in community, and safety when swallowing.Factors affecting potential to achieve goals and functional outcome are none noted today. Patient will benefit from skilled SLP services to address above impairments and improve overall function.  REHAB POTENTIAL: Good  PLAN:  SLP FREQUENCY: 2x/week  SLP DURATION: 8 weeks  PLANNED INTERVENTIONS: Aspiration precaution training, Pharyngeal strengthening exercises, Diet toleration management , Cueing hierachy, Internal/external aids, Oral motor exercises, Functional tasks, SLP instruction and feedback, Compensatory strategies, Patient/family education, [phone] Treatment of speech (30 or 45 min) , and [id number] Treatment of swallowing function    Bernice Mcauliffe, CCC-SLP 01/19/2024, 11:10 AM

## 2024-01-19 NOTE — Therapy (Signed)
 OUTPATIENT PHYSICAL THERAPY NEURO TREATMENT NOTE/10th VISIT PROGRESS NOTE   Patient Name: Tyler [name] MRN: [id number] DOB:December 24, 1950, 73 y.o., male Today's Date: 01/19/2024   PCP: Janey Santos, MD REFERRING PROVIDER: Evonnie Asberry RAMAN, DO  Progress Note Reporting Period 12/09/2023 to 01/19/2024  See note below for Objective Data and Assessment of Progress/Goals.     END OF SESSION:  PT End of Session - 01/19/24 1021     Visit Number 10    Number of Visits 14    Date for Recertification  02/10/24    Authorization Type [employer]    PT Start Time 1019    PT Stop Time 1101    PT Time Calculation (min) 42 min    Equipment Utilized During Treatment --   close supervision   Activity Tolerance Patient tolerated treatment well    Behavior During Therapy WFL for tasks assessed/performed               Past Medical History:  Diagnosis Date   Adenomatous polyp of colon 02/2004   Anemia    Anxiety    Arthritis    BPH (benign prostatic hyperplasia)    Colonic varices    Degenerative joint disease    Depression    Depression    Fatty liver    GERD (gastroesophageal reflux disease)    Heart murmur    Hemorrhoids    Hyperlipidemia    Hypertension    IBS (irritable bowel syndrome)    Prostate cancer (HCC) 2011   S/P minimally invasive mitral valve repair 04/11/2020   Complex valvuloplasty including artificial Gore-tex neochord placement x8 with suture plication of posterior commissure and 30 mm Sorin Memo 3D ring annuloplasty via right mini thoracotomy approach   Thrombocytopenia    Thyroid  nodule    Past Surgical History:  Procedure Laterality Date   BUBBLE STUDY  12/22/2019   Procedure: BUBBLE STUDY;  Surgeon: Barbaraann Darryle Ned, MD;  Location: [facility] ENDOSCOPY;  Service: Cardiovascular;;   CARDIAC CATHETERIZATION     COLONOSCOPY     MASS EXCISION Right 05/14/2021   Procedure: REPAIR RIGHT CHEST WALL HERNIA WITH MESH;  Surgeon: Kerrin Elspeth BROCKS,  MD;  Location: [facility] OR;  Service: Thoracic;  Laterality: Right;   MITRAL VALVE REPAIR Right 04/11/2020   Procedure: MINIMALLY INVASIVE MITRAL VALVE REPAIR (MVR) USING MEMO 4D RING SIZE ;  Surgeon: Dusty Sudie DEL, MD;  Location: [facility] OR;  Service: Open Heart Surgery;  Laterality: Right;   PROSTATECTOMY     RIGHT/LEFT HEART CATH AND CORONARY ANGIOGRAPHY N/A 12/22/2019   Procedure: RIGHT/LEFT HEART CATH AND CORONARY ANGIOGRAPHY;  Surgeon: Wonda Sharper, MD;  Location: [facility] INVASIVE CV LAB;  Service: Cardiovascular;  Laterality: N/A;   TEE WITHOUT CARDIOVERSION N/A 12/22/2019   Procedure: TRANSESOPHAGEAL ECHOCARDIOGRAM (TEE);  Surgeon: Barbaraann Darryle Ned, MD;  Location: [facility] ENDOSCOPY;  Service: Cardiovascular;  Laterality: N/A;   TEE WITHOUT CARDIOVERSION N/A 04/11/2020   Procedure: TRANSESOPHAGEAL ECHOCARDIOGRAM (TEE);  Surgeon: Dusty Sudie DEL, MD;  Location: [facility] OR;  Service: Open Heart Surgery;  Laterality: N/A;   TONSILLECTOMY     TOOTH EXTRACTION  03/07/2020   TYMPANOPLASTY  01/2016   Patient Active Problem List   Diagnosis Date Noted   Incisional hernia 05/14/2021   S/P minimally invasive mitral valve repair 04/11/2020   Mitral regurgitation 12/20/2019   VARICES OF OTHER SITES 12/30/2007   History of colonic polyps 12/25/2007    ONSET DATE: August 2024  REFERRING DIAG:  G20.A1 (ICD-10-CM) - Parkinson's  disease without dyskinesia or fluctuating manifestations (HCC)    THERAPY DIAG:  Muscle weakness (generalized)  Unsteadiness on feet  Other abnormalities of gait and mobility  Rationale for Evaluation and Treatment: Rehabilitation  SUBJECTIVE:                                                                                                                                                                                             SUBJECTIVE STATEMENT: A little soreness from the ingrown toenail procedure last week.  Still soaking it twice a day.  Trying to stay off my foot a  little more. Pt accompanied by: self  PERTINENT HISTORY: hx of mitral valve replacement, tendon problem in R shoulder (hx of this, tendon detached from shoulder), hx of L RTC and R RTC issues  PAIN:  Are you having pain? Yes: NPRS scale: 2/10 Pain location: L toe Pain description: sore Aggravating factors: standing, walking, shoe moving against toe Relieving factors: soaking it  PRECAUTIONS: None  RED FLAGS: None   WEIGHT BEARING RESTRICTIONS:   FALLS: Has patient fallen in last 6 months? one fall a couple weeks ago on his treadmill  LIVING ENVIRONMENT: Lives with: lives with their spouse Lives in: House/apartment Stairs: stairs inside/outside, does have chair lift available if needed Has following equipment at home: None  PLOF: Independent  Exercise routine at home:  treadmill at home x 30 minutes, light weights, and some balance exercises 3-4 days per week.  PATIENT GOALS: balance  OBJECTIVE:    TODAY'S TREATMENT: 01/19/2024 Activity Comments  FTSTS: 16.84 sec 10 M walk: 10.22 sec (3.21 ft/sec) 16M walk back: 6.43 sec *Pt has recently had ingrown toenail removed, so moving a little more slowly  Discussed fitness options after PT  -continuing HEP, PWR! Moves, treadmill -verbalizes plans to look into PD cycling, [employer], [place]! Moves  LE PRE 10# ankle weights -LAQ 3x10, -Marching in place 3 x 10 -hip abd 3x10,    Seated postural work with 4.4# green weighted ball Reach up/reach out x 10 Trunk rotation x 10  Seated aerobic activity: March + alt UE motion x 30 sec LAQ + alt UE motion x 30 sec  NuStep, Level 4, 4 extremities x 8 minutes Cues to keep SPM>70       Access Code: [email or web address] URL: [email or web address] Date: 12/17/2023, 12/24/2023 Prepared by: [facility] - Outpatient  Rehab - [facility]  *PWR MOVES STANDING, 10-20 reps 1x/day Exercises - Alternating Step Backward with Support  - 1 x daily - 7 x weekly - 1-2 sets - 10 reps -  Goblet Squat with Kettlebell  - 2-3 x  weekly - 3 sets - 10 reps - Sumo Squat with Dumbbell  - 2-3 x weekly - 3 sets - 10 reps - Reverse Lunge  - 2-3 x weekly - 3 sets - 10 reps - Kettlebell Suitcase Carry  - 2-3 x weekly - Standing Bent Over Single Arm Shoulder Row  - 2-3 x weekly - 3 sets - 10 reps  PATIENT EDUCATION: Education details: Rationale for seated exercise today to avoid pain in toe as he is healing from ingrown toenail surgery, options for continued community fitness Person educated: Patient Education method: Explanation, Demonstration, and Handouts Education comprehension: verbalized understanding, returned demonstration, and needs further education  ---------------------------------- Note: Objective measures were completed at Evaluation unless otherwise noted.  DIAGNOSTIC FINDINGS:   COGNITION: Overall cognitive status: Within functional limits for tasks assessed   SENSATION: WFL  COORDINATION: intact  EDEMA:  none  MUSCLE TONE: slight, 1+ bilateral biceps  MUSCLE LENGTH: WNL  DTRs:  NT  POSTURE: No Significant postural limitations  LOWER EXTREMITY ROM:     WNL  LOWER EXTREMITY MMT:    5/5  BED MOBILITY:  indep  TRANSFERS: indep    CURB:  Findings: indep  STAIRS: Not tested--reports independent but relies on HR for security GAIT: Findings: Comments: no deviations, decreased speed  FUNCTIONAL TESTS:  5 times sit to stand: 11 sec 10 meter walk test: 12.13 sec = 2.7 ft/sec Mini-BESTest: 22/28 : 5.3 sec                                                                                                                                TREATMENT DATE: 12/09/23    PATIENT EDUCATION: Education details: discussed relevant exercise intervention strategies/methods for tx of PD, initiated discussion of group classes in community, rationale of interventions Person educated: Patient and Spouse Education method: Explanation Education  comprehension: needs further education  HOME EXERCISE PROGRAM: TBD  GOALS: Goals reviewed with patient? Yes  SHORT TERM GOALS: Target date: 01/06/2024    Patient will be independent in HEP to improve functional outcomes Baseline: Goal status: MET, 01/06/2024  2.  Demo reduced risk for falls per time 4.8 sec 3-Meter Backwards Walk Test Baseline: 5.3 sec>6.37 sec 01/06/2024 Goal status: IN PROGRESS, 01/06/2024  3.  Demo ability to sustain speed of 3.25 ft/sec during ambulation to improve community mobility Baseline: 2.7 ft/sec>4.58 ft/sec Goal status: MET, 01/06/2024   LONG TERM GOALS: Target date: 02/10/2024    Pt to verbalize understanding or participation in community-based classes for people with Parkinson's to improve activity carryover Baseline:  Goal status: IN PROGRESS  2.  Demo improved balance and reduced fall risk per score 25/28 Mini-BESTest Baseline: 22/28 Goal status: IN PROGRESS  3.  Patient to teach-back metrics for defining and executing high intensity training Baseline:  Goal status: IN PROGRESS   ASSESSMENT:  CLINICAL IMPRESSION: 10th Visit PN:  Pt presents today and reports he is moving more confidently during the  course of therapy. Skilled PT session focused on lower extremity strengthening and education about ways to incorporate community fitness into his current fitness routine.  He is continueing to have some pain in L toe and gaurding with initial functional measures.  FTSTS and 3 M walk backwards are slightly slower than eval, but likely due to his recent ingrown toenail surgery.  Overall, he is progressing well, and he met 2 of 3 LTGs.  Pt will continue to benefit from skilled PT towards goals for improved functional mobility and decreased fall risk.   OBJECTIVE IMPAIRMENTS: decreased activity tolerance, decreased balance, decreased endurance, decreased knowledge of condition, difficulty walking, and impaired tone.   ACTIVITY LIMITATIONS: carrying,  lifting, stairs, and locomotion level  PARTICIPATION LIMITATIONS: community activity, yard work, and exercise routine  PERSONAL FACTORS: Age, Time since onset of injury/illness/exacerbation, and 1 comorbidity: PMH are also affecting patient's functional outcome.   REHAB POTENTIAL: Excellent  CLINICAL DECISION MAKING: Evolving/moderate complexity  EVALUATION COMPLEXITY: Moderate  PLAN:  PT FREQUENCY: 2x/wk x 4 wks, then 1x/wk x 4 wks  PT DURATION: 8 weeks  PLANNED INTERVENTIONS: 97750- Physical Performance Testing, 97110-Therapeutic exercises, 97530- Therapeutic activity, [id number]- Neuromuscular re-education, 97535- Self Care, [id number]- Manual therapy, [id number]- Gait training, [phone]- Canalith repositioning, [phone]- Aquatic Therapy, and Patient/Family education  PLAN FOR NEXT SESSION: Work on step strategy varied directions, compliant surface balance, high intensity interval training, gait activities and relevant metrics for self-monitoring.  Continue to discuss plans for joining community fitness class(es) upon d/c from PT  Greig Anon, PT 01/19/24 11:02 AM Phone: [phone] Fax: [phone]  [facility] at [facility] [address] [place], [address] [place], [place] [address] Phone # [phone] Fax # [phone]

## 2024-01-19 NOTE — Patient Instructions (Signed)
   SWALLOWING EXERCISES Perform these for 8 weeks, at least 5 days/week  Effortful Swallows - Press your tongue hard against the roof of your mouth, then squeeze hard with the muscles in your neck while you swallow your saliva or a sip of water - Repeat 30 times a day   [place] Swallow - swallow with your tongue sticking out - Stick tongue out and gently bite tongue with your teeth - Swallow, while holding your tongue with your teeth - Repeat 30 times a day   M[place]elsohn Maneuver -  swallow as tight as you  for 5 seconds - Start to swallow, and keep your Adam's apple up by squeezing tight with the muscles of the throat - Hold the squeeze for 5-7 seconds and then relax - Repeat 30 times a day

## 2024-01-19 NOTE — Therapy (Signed)
 OUTPATIENT OCCUPATIONAL THERAPY PARKINSON'S TREATMENT  Patient Name: Tyler [name] MRN: [id number] DOB:04-12-1950, 73 y.o., male Today's Date: 01/19/2024  PCP: Janey Santos REFERRING PROVIDER: Evonnie Asberry RAMAN, DO  END OF SESSION:   OT End of Session - 01/19/24 1225     Visit Number 7    Number of Visits 17    Date for Recertification  02/19/24    Authorization Type [employer]-VL:[place]    OT Start Time 1150    OT Stop Time 1232    OT Time Calculation (min) 42 min    Activity Tolerance Patient tolerated treatment well    Behavior During Therapy [facility] for tasks assessed/performed              Past Medical History:  Diagnosis Date   Adenomatous polyp of colon 02/2004   Anemia    Anxiety    Arthritis    BPH (benign prostatic hyperplasia)    Colonic varices    Degenerative joint disease    Depression    Depression    Fatty liver    GERD (gastroesophageal reflux disease)    Heart murmur    Hemorrhoids    Hyperlipidemia    Hypertension    IBS (irritable bowel syndrome)    Prostate cancer (HCC) 2011   S/P minimally invasive mitral valve repair 04/11/2020   Complex valvuloplasty including artificial Gore-tex neochord placement x8 with suture plication of posterior commissure and 30 mm Sorin Memo 3D ring annuloplasty via right mini thoracotomy approach   Thrombocytopenia    Thyroid  nodule    Past Surgical History:  Procedure Laterality Date   BUBBLE STUDY  12/22/2019   Procedure: BUBBLE STUDY;  Surgeon: Barbaraann Darryle Ned, MD;  Location: [facility] ENDOSCOPY;  Service: Cardiovascular;;   CARDIAC CATHETERIZATION     COLONOSCOPY     MASS EXCISION Right 05/14/2021   Procedure: REPAIR RIGHT CHEST WALL HERNIA WITH MESH;  Surgeon: Kerrin Elspeth BROCKS, MD;  Location: [facility] OR;  Service: Thoracic;  Laterality: Right;   MITRAL VALVE REPAIR Right 04/11/2020   Procedure: MINIMALLY INVASIVE MITRAL VALVE REPAIR (MVR) USING MEMO 4D RING SIZE ;  Surgeon: Dusty Sudie DEL, MD;   Location: [facility] OR;  Service: Open Heart Surgery;  Laterality: Right;   PROSTATECTOMY     RIGHT/LEFT HEART CATH AND CORONARY ANGIOGRAPHY N/A 12/22/2019   Procedure: RIGHT/LEFT HEART CATH AND CORONARY ANGIOGRAPHY;  Surgeon: Wonda Sharper, MD;  Location: [facility] INVASIVE CV LAB;  Service: Cardiovascular;  Laterality: N/A;   TEE WITHOUT CARDIOVERSION N/A 12/22/2019   Procedure: TRANSESOPHAGEAL ECHOCARDIOGRAM (TEE);  Surgeon: Barbaraann Darryle Ned, MD;  Location: [facility] ENDOSCOPY;  Service: Cardiovascular;  Laterality: N/A;   TEE WITHOUT CARDIOVERSION N/A 04/11/2020   Procedure: TRANSESOPHAGEAL ECHOCARDIOGRAM (TEE);  Surgeon: Dusty Sudie DEL, MD;  Location: [facility] OR;  Service: Open Heart Surgery;  Laterality: N/A;   TONSILLECTOMY     TOOTH EXTRACTION  03/07/2020   TYMPANOPLASTY  01/2016   Patient Active Problem List   Diagnosis Date Noted   Incisional hernia 05/14/2021   S/P minimally invasive mitral valve repair 04/11/2020   Mitral regurgitation 12/20/2019   VARICES OF OTHER SITES 12/30/2007   History of colonic polyps 12/25/2007    ONSET DATE: 12/09/2023 referral date; diagnosed with PD in 10/2022  REFERRING DIAG:  Diagnosis  G20.A1 (ICD-10-CM) - Parkinson's disease without dyskinesia or fluctuating manifestations (HCC)    THERAPY DIAG:  Muscle weakness (generalized)  Other lack of coordination  Rationale for Evaluation and Treatment: Rehabilitation  SUBJECTIVE:  SUBJECTIVE STATEMENT: Pt reports sore toe from surgery a week ago.  Pt accompanied by: self  PERTINENT HISTORY: hx of mitral valve replacement, prostate cancer  PRECAUTIONS: Other: mitral valve repair with ring, fall, cancer hx, HTN  WEIGHT BEARING RESTRICTIONS: No  PAIN:  Are you having pain? Yes: NPRS scale: 2/10 Pain location: L big toe Pain description: Had surgery on it for ingrown toenail Aggravating factors: Excessive movement Relieving factors: rest, medication, ice  FALLS: Has patient fallen in last 6  months? Yes. Number of falls 1, treadmill  LIVING ENVIRONMENT: Lives with: lives with their spouse Lives in: House/apartment Stairs: stairs inside/outside, does have chair lift available if needed Has following equipment at home: Wheelchair (manual), shower chair, and Grab bars  PLOF: Independent  PATIENT GOALS: I want to maintain function as long as possible. Several years ago my strength kind of went away.   OBJECTIVE:  Note: Objective measures were completed at Evaluation unless otherwise noted.  HAND DOMINANCE: Right  ADLs: Overall ADLs: Reports independence but some tasks that require FM coordination takes longer Transfers/ambulation related to ADLs: I/Mod I Eating: Independent Grooming: Independent UB Dressing: Difficulty buttoning LB Dressing: Difficulty buttoning pants Toileting: Independent Bathing:  Mod Independent Tub Shower transfers: Mod Independent Equipment: Shower seat with back, Grab bars, and Walk in shower  IADLs: Shopping: Wife usually completes even before diagnosis Light housekeeping: Completing laundry and washes dishes  Meal Prep: wife completes cooking even before PD Community mobility: Driving Medication management: Completes Landscape architect: Independent Handwriting: 100% legible and Mild micrographia  MOBILITY STATUS: Independent and Hx of falls  POSTURE COMMENTS:  No Significant postural limitations  ACTIVITY TOLERANCE: Activity tolerance: Pt reports no changes  FUNCTIONAL OUTCOME MEASURES: Fastening/unfastening 3 buttons: 34.54 seconds Physical performance test: PPT#2 (simulated eating) 17.50 seconds & PPT#4 (donning/doffing jacket): 23.04 seconds    01/19/24   COORDINATION: 9 Hole Peg test: Right: 40.30 sec; Left: 38.43 sec Box and Blocks:  Right 32 blocks, Left 38 blocks (Did at first bring R hand around standing piece but corrected with verbal cue)  UE ROM:  WFL  UE MMT:   4/5 grossly  SENSATION:  WFL; however  reports occasional numbness in thenar eminence of R thumb, reports doctor said it's most likely d/t overuse. Was a engineer, civil (consulting) for work.  MUSCLE TONE: RUE: Within functional limits and LUE: Within functional limits  COGNITION: Overall cognitive status: Within functional limits for tasks assessed  OBSERVATIONS: decreased strength, decreased FM coordination especially in R dominant hand, difficulty with ADLs especially buttoning/zipping up jacket, pt reports micrographia when writing longer sentences as well                                                                                                                    TREATMENT DATE:  01/19/24 PSFS: see above for score.  Pt reporting that opening jars and bottles still continue to be challenging. Pt did receive jar and bottle openers but reporting needing additional practice. AE: OT providing pt with  built up handle with slit to allow pt to easily place on and off of jar and bottle opener.  Pt then opening jar and water bottle with both with additional of foam handle, reporting decreased pressure through palm of hand.   Grip strengthening: OT provided pt with red theraputty for grip strengthening.  Engaged in various movements to simulate grip and pinch as needed to open various jars, bottles, and cans.  Provided with handouts as well as encouraging pt to trial various movements as carried over to functional tasks.   - Putty Squeezes  - Finger Pinch and Pull with Putty   - Wrist Flexion and Extension with putty   01/14/24 Re-assessed Box and Blocks and PPTS (doing/undoing buttons and putting on/taking off jacket). See goals for updated measurements.  Reviewed with pt how to utilize button hooks for doing/undoing buttons. D/t improvements noted in PPT advised pt to use button hook for fastening buttons only and use hands to undo buttons at this time. Did show pt how to undo buttons with button hook as well.  Assessed handwriting, pt  wrote a short paragraph. 100% legibility, noted mild decrease in spacing and mild micrographia. Recommended gel pens rather than roller ball pens for improved control and to continue completing hand flicks before and during handwriting prn.  Finally practiced use of bottle opener and jar opener. Pt demonstrates good understanding and reports he has ordered some of his own that are on the way.  01/12/24 Coordination: engaged in picking up small pegs one at a time with RUE to complete peg board pattern.  Pt demonstrating good, intentional movements, when picking up and rotating pegs to place into pegboard.  OT challenging pt to remove pegs one at a time and hold in palm before transferring back to finger tips to place into container.  Pt demonstrating mild difficulty with translation but good effort.   Handwriting: Provided with handout of recommendations from [facility] to aid in legibility with handwriting.  Engaged in handwriting with various styles of pens with focus on legibility and sizing. OT educating on use of print font, lined paper, and various pen styles for improved handwriting. Reiterated use of large amplitude hand flicks prior to or during handwriting. 9 hole peg test: right: 37.41 sec, left: 38.38 sec.  Pt demonstrating 3 second improvement with RUE.   PATIENT EDUCATION: Education details: handwriting, coordination, large amplitude hands Person educated: Patient Education method: Explanation, Demonstration, and Handouts Education comprehension: verbalized understanding, returned demonstration, and verbal cues required  HOME EXERCISE PROGRAM: 12/31/23 - Seated Shoulder Flexion with Self-Anchored Resistance  - 1 x daily - 4 x weekly - 3 sets - 10 reps - Seated Shoulder Horizontal Abduction with Resistance  - 1 x daily - 4 x weekly - 3 sets - 10 reps - Seated Elbow Flexion with Self-Anchored Resistance  - 1 x daily - 4 x weekly - 3 sets - 10 reps - Seated Elbow Extension  with Self-Anchored Resistance  - 1 x daily - 4 x weekly - 3 sets - 10 reps  Access Code: [email or web address] URL: [email or web address] Date: 01/19/2024 Prepared by: [facility] - Outpatient  Rehab - [facility]  Exercises - Seated Gripping Towel  - 1 sets - 10 reps - Putty Squeezes  - 1 sets - 10 reps - Finger Pinch and Pull with Putty  - 1 sets - 10 reps - Wrist Flexion and Extension with Resistance Bar (putty)  - 1 sets - 10 reps  GOALS: Goals  reviewed with patient? Yes  SHORT TERM GOALS: Target date: 01/21/24  Pt will verbalize understanding of adaptive strategies to increase ease with ADLS/IADLS   Baseline: New to OP OT Goal status: in progress  2.  Pt will demonstrate improved ease with fastening buttons as evidenced by decreasing 3 button/unbutton time by at least 5 seconds  Baseline: 34.54 seconds 01/14/24: 25.66 seconds Goal status: GOAL MET  3.  Pt will demonstrate improved FM coordination and functional use of RUE by increasing Box and Blocks score by at least 3 blocks Baseline: Right 32 blocks, Left 38 blocks 01/14/24: 31 blocks R hand Goal status: in progress  4.  Pt will be independent with HEPs for UE strength and FM coordination with handouts for reference prn  Baseline: New to OP OT  Goal status: in progress  5. Pt will write short paragraph with 75% legibility and mild to no micrographia Baseline:  Wrote short sentence ([place]) with 100% legible and Mild micrographia. Pt reports micrographia worsens the longer he writes. Goal status: in progress  6. Pt will demonstrate an improved average PSFS score of at least 2 points, or individual score improvement of 3 points   Baseline:   01/19/24: 5.0  Goal status: in progress   LONG TERM GOALS: Target date: 02/18/24  Pt will write short paragraph with 100% legibility and mild to no micrographia Baseline:  Wrote short sentence ([place]) with 100% legible and Mild  micrographia. Pt reports micrographia worsens the longer he writes. Goal status: in progress  2.  Pt will demonstrate improved fine motor coordination for ADLs as evidenced by decreasing 9 hole peg test score for R hand by at least 3 secs  Baseline: Right: 40.30 sec; Left: 38.43 sec Goal status: in progress  3.  Pt will demonstrate increased ease with dressing as evidenced by decreasing PPT#4 (don/ doff jacket) to 20 secs or less  Baseline: PPT#4 (donning/doffing jacket): 23.04 seconds 01/14/24: 12.18 seconds  Goal status: GOAL MET   4.  Pt will verbalize understanding of ways to prevent future PD related complications and PD community resources. Baseline: Initiated with [employer] classes Goal status: in progress   5.  Pt will demonstrate improved FM coordination and functional use of RUE by increasing Box and Blocks score by at least 6 blocks Baseline: Right 32 blocks, Left 38 blocks 01/14/24: 31 blocks  Goal status: in progress   ASSESSMENT:  CLINICAL IMPRESSION: Patient is a 73 y.o. male who was seen today for occupational therapy tx for PD management. Pt has made improvements in PPT tests and reporting improvements with use of AE to aid in opening bottles and jars, however when completing PSFS pt reporting lower score on getting lids off.  Pt benefiting from built up handle to jar opener and will plan to add to his personal ones at home.  Patient will continue to benefit from skilled therapy services to better manage occupational barriers secondary to Parkinson's and improve independence and safety with ADLs and IADLs.   PERFORMANCE DEFICITS: in functional skills including ADLs, IADLs, coordination, dexterity, strength, Fine motor control, balance, cardiopulmonary status limiting function, and UE functional use, cognitive skills including safety awareness and sequencing, and psychosocial skills including coping strategies and environmental adaptation.   IMPAIRMENTS: are limiting  patient from ADLs, IADLs, and leisure.     PLAN:  OT FREQUENCY: 2x/week  OT DURATION: 8 weeks  PLANNED INTERVENTIONS: 02831 OT Re-evaluation, 97535 self care/ADL  training, 02889 therapeutic exercise, 97530 therapeutic activity, 97112 neuromuscular re-education, 97140 manual therapy, passive range of motion, functional mobility training, energy conservation, coping strategies training, patient/family education, and DME and/or AE instructions  RECOMMENDED OTHER SERVICES: none noted   CONSULTED AND AGREED WITH PLAN OF CARE: Patient  PLAN FOR NEXT SESSION:  Coordination activities Grip strengthening Continue BIG movements Further AE recommendations prn    KAYLENE DOMINO, OTR/L 01/19/2024, 12:39 PM  [facility] at [facility] [address], [address] [place], [place] [address] Phone # [phone] Fax # [phone]

## 2024-01-21 ENCOUNTER — Ambulatory Visit

## 2024-01-21 DIAGNOSIS — R1312 Dysphagia, oropharyngeal phase: Secondary | ICD-10-CM

## 2024-01-21 DIAGNOSIS — R471 Dysarthria and anarthria: Secondary | ICD-10-CM

## 2024-01-21 DIAGNOSIS — R278 Other lack of coordination: Secondary | ICD-10-CM

## 2024-01-21 DIAGNOSIS — M6281 Muscle weakness (generalized): Secondary | ICD-10-CM

## 2024-01-21 NOTE — Therapy (Signed)
 OUTPATIENT SPEECH LANGUAGE PATHOLOGY [facility]   Patient Name: Tyler [name] MRN: [id number] DOB:05/27/1950, 73 y.o., male Today's Date: 01/21/2024  PCP: Janey Santos, MD REFERRING PROVIDER: Evonnie Stabs, DO  END OF SESSION:  End of Session - 01/21/24 0939     Visit Number 8    Number of Visits 17    Date for Recertification  02/06/24    SLP Start Time 0937    SLP Stop Time  1015    SLP Time Calculation (min) 38 min    Activity Tolerance Patient tolerated treatment well              Past Medical History:  Diagnosis Date   Adenomatous polyp of colon 02/2004   Anemia    Anxiety    Arthritis    BPH (benign prostatic hyperplasia)    Colonic varices    Degenerative joint disease    Depression    Depression    Fatty liver    GERD (gastroesophageal reflux disease)    Heart murmur    Hemorrhoids    Hyperlipidemia    Hypertension    IBS (irritable bowel syndrome)    Prostate cancer (HCC) 2011   S/P minimally invasive mitral valve repair 04/11/2020   Complex valvuloplasty including artificial Gore-tex neochord placement x8 with suture plication of posterior commissure and 30 mm Sorin Memo 3D ring annuloplasty via right mini thoracotomy approach   Thrombocytopenia    Thyroid  nodule    Past Surgical History:  Procedure Laterality Date   BUBBLE STUDY  12/22/2019   Procedure: BUBBLE STUDY;  Surgeon: Barbaraann Darryle Ned, MD;  Location: [facility] ENDOSCOPY;  Service: Cardiovascular;;   CARDIAC CATHETERIZATION     COLONOSCOPY     MASS EXCISION Right 05/14/2021   Procedure: REPAIR RIGHT CHEST WALL HERNIA WITH MESH;  Surgeon: Kerrin Elspeth BROCKS, MD;  Location: [facility] OR;  Service: Thoracic;  Laterality: Right;   MITRAL VALVE REPAIR Right 04/11/2020   Procedure: MINIMALLY INVASIVE MITRAL VALVE REPAIR (MVR) USING MEMO 4D RING SIZE ;  Surgeon: Dusty Sudie DEL, MD;  Location: [facility] OR;  Service: Open Heart Surgery;  Laterality: Right;   PROSTATECTOMY     RIGHT/LEFT  HEART CATH AND CORONARY ANGIOGRAPHY N/A 12/22/2019   Procedure: RIGHT/LEFT HEART CATH AND CORONARY ANGIOGRAPHY;  Surgeon: Wonda Sharper, MD;  Location: [facility] INVASIVE CV LAB;  Service: Cardiovascular;  Laterality: N/A;   TEE WITHOUT CARDIOVERSION N/A 12/22/2019   Procedure: TRANSESOPHAGEAL ECHOCARDIOGRAM (TEE);  Surgeon: Barbaraann Darryle Ned, MD;  Location: [facility] ENDOSCOPY;  Service: Cardiovascular;  Laterality: N/A;   TEE WITHOUT CARDIOVERSION N/A 04/11/2020   Procedure: TRANSESOPHAGEAL ECHOCARDIOGRAM (TEE);  Surgeon: Dusty Sudie DEL, MD;  Location: [facility] OR;  Service: Open Heart Surgery;  Laterality: N/A;   TONSILLECTOMY     TOOTH EXTRACTION  03/07/2020   TYMPANOPLASTY  01/2016   Patient Active Problem List   Diagnosis Date Noted   Incisional hernia 05/14/2021   S/P minimally invasive mitral valve repair 04/11/2020   Mitral regurgitation 12/20/2019   VARICES OF OTHER SITES 12/30/2007   History of colonic polyps 12/25/2007    ONSET DATE: diagnosed one year ago; script 11/06/23  REFERRING DIAG: Hypophonia  THERAPY DIAG:  Dysarthria and anarthria  Dysphagia, oropharyngeal phase  Rationale for Evaluation and Treatment: Rehabilitation  SUBJECTIVE:   SUBJECTIVE STATEMENT: Pt repots completing HEPs.  Pt accompanied by: self  PERTINENT HISTORY: Pt dx'd with PD August 2024 with sx prior to that.  PAIN:  Are you having pain? Yes: NPRS  scale: 1/10 Pain location: left toe (first) Pain description: sore Aggravating factors: walking Relieving factors: resting  FALLS: Has patient fallen in last 6 months?  See PT evaluation for details   PATIENT GOALS: Improve speech and swallowing  OBJECTIVE:  Note: Objective measures were completed at Evaluation unless otherwise noted.  MBS 12/22/23: Pt demonstrates a mild oropharyngeal dysphagia consisting of good airway protection, but poor oral efficiency and mild pharyngeal residue. Pt recommended to continue regular diet and thin liquids with  f/u with OP SLP for appropriate strategies.    Pt has inefficient oral phase with unnecessary lingual movement to gather bolus and initiate swallow as well a piecemeal transit and loss of bolus to floor of mouth and buccal cavity. When cued to do so pt can increase efficency and improve bolus cohesion to decrease oral residue and reduce number of swallows gather it and swallow it all at once. Pt also has mild pharyngeal residue with thin liquids, likely due to oral deficits and slightly decreased ROM and duration of swallow. Again with cues for an effortful swallow pt increases these measures and reduces residue. Pt did not aspirate during the study, but expect some penetration or aspiration of oral residue and pharyngeal residue pooling after bites and sips. Also noted bumpy posterior pharyngeal wall and esophageal column against cervical spine, suggesting loss of tissue mass with age/sarcopenia. Pt to f/u with OP SLP to implement strategies. Would not suggest any texture changes.   DIGEST Swallow Severity Rating*             Safety: 0             Efficiency:1             Overall Pharyngeal Swallow Severity: 1 1: mild; 2: moderate; 3: severe; 4: profound  *The Dynamic Imaging Grade of Swallowing Toxicity is standardized for the head and neck cancer population, however, demonstrates promising clinical applications across populations to standardize the clinical rating of pharyngeal swallow safety and severity.   Factors that may increase risk of adverse event in presence of aspiration Noe & Lianne 2021):     Swallow Evaluation Recommendations Recommendations: PO diet PO Diet Recommendation: Regular;Thin liquids (Level 0) Liquid Administration via: Cup;Straw Medication Administration: Whole meds with liquid Supervision: Patient able to self-feed Swallowing strategies  : effortful swallow;Avoid mixed consistencies Postural changes: Position pt fully upright for meals;Stay upright 30-60 min  after meals Oral care recommendations: Oral care BID (2x/day)  PATIENT REPORTED OUTCOME MEASURES (PROM): Communication Participation Item Bank: returned 01/12/24 and pt scored 16/30 with lower scores indicating more barriers to communication due to pt's softer speech. All answers were 1-2 (with options from 0-3).                                                                                                                            TREATMENT DATE:   01/21/24: SPEECH: SLP guided pt through his HEP for louder more articulate speech. Pt's  loud /a/ averaged 86dB, and his sentences were 78dB prior to SLP usual mod-max cues for loudness; then they increased to 82dB. Pt was told to cont with his HEP, and SLP added sentence level responses to cont to make WNL volume speech a habit. In phrase responses pt averaged 69dB which improved to 70dB with initial mod cues faded to rare min cues for loudness. SLP varied cognitive load from minimal to mod-complex. Homework to practice at sentence level 2 15-minute periods or one 20-minute period/day.   01/19/24: SWALLOW: SLP guided pt through his swallowing HEP. Benefited from visual cues of a mirror for [place]; pt agreed. Masako req'd min A faded to independent. Initial min cues for effortful and then independent. With POs, pt req'd cues for not taking a sip with a solid bolus  but just enough liquid to wet the solid bolus (no extra water in the mouth), min A faded to independent with precautions.  SPEECH: SLP guided pt through his HEP for louder more articulate speech. Pt's loud /a/ averaged 85dB, and his sentences were 81dB prior to SLP mod cues for loudness; then they increased to 83dB. Pt was told to cont with his HEP.   01/14/24: Pt needs swallow HEP done next session.  SPEECH: SLP asked Alhaji to cont to look at his schedule and let SLP know how this strategy is going next visit about keeping better organized. Pt's average with loud /a/ was 87dB, and pt  wrote 4 more everyday sentences; Average today with these was 79dB. In two conversation segments, one of 8 minutes, one of 9 minutes, and one of 12 minutes pt maintained WNL volume. He noted that being in the ST room, with the sound level meter made it easier to concentrate on loudness and stated he needed to do better at home with increasing his loudness.   11/10/25BETHA Marty told SLP that he feels like he is not as organized with his days as he was previously and wonders if PD has lessened his skills with executive function. SLP suggested he sit down with his medical appointments and to-do list and spend ~5 minutes thinking through his next day. He thanked SLP for this suggestion. With loud /a/ pt's average was 87dB, and SLP assisted pt in developing everyday sentences. He was told to complete 4-5 more sentences until next session.  Average today with these was 77dB and SLP had pt incr loudness and he did, to average 79dB.   01/07/24: CPIB was not returned today. Will bring next time.  SWALLOW: Pt states the HEP for swallowing is difficult. In light of this SLP reviewed pt's HEP with him today. Pt stated his confidence in performing the HEP was better, from 8-9/10 (where 10=100% confident about procedure). SPEECH: SLP modeled loud /a/ for pt and provided rationale for loud /a/, and he generated loud /a/ average 86dB. Pt has not been successful with routine completion with these. SLP provided some examples of how to accomplish that. SLP reviewed the rationale for reading out loud. With reading sentences, pt's average was 74dB with initial min A initially. SLP progressed into paragraphs and pt loudness was 71dB with initial cues for intent, deliberate, purposeful speech. For homework, SLP told pt to read out loud for 30 seconds x10 after loud /a/.   01/05/24: CPIB provided to pt today. Discussed/reviewed MBS with pt today. Stressed gather and swallow concept for each swallow was much like swallowing with more  intent/purpose like SLP and pt had discussed about his  speech in previous session. SLP assisted pt with practice with gather and swallow concept with swallows of water. Pt with more effort with swallow on the swallows he thought gather and swallow was more salient for him. SLP then developed HEP for pt and SLP assessed pt's procedure with these exercises and corrected PRN. Pt to complete at least 5 days/week with 30 reps/day.   12/22/23: NEEDS CPIB NEXT SESSION. Explained the nature of fluoroscopy with pt, and that this machine will record his swallowing with food textures and liquids. Explained about dB being a logarithmic scale. SLP shaped pt's loud /a/ to average 82dB, started at 73dB. Pt read sentences and req'd SLP usual mod-max cues to use intent and purpose with effortful voice. Digital recording provided beneficial auditory feedback of pt's productions. Homework to read out loud 30 seconds x10, and perform 10 loud /a/, BID.  12/09/23: n/a  PATIENT EDUCATION: Education details: See treatment date Person educated: Patient Education method: Explanation, Demonstration, Verbal cues, and Handouts Education comprehension: verbalized understanding, returned demonstration, verbal cues required, and needs further education  HOME EXERCISE PROGRAM: HEP for louder speech, HEP for dysphagia   GOALS: Goals reviewed with patient? Yes, generally  SHORT TERM GOALS: Target date:  01/26/24 (due to visit count)  Pt will undergo MBS if clinically indicated Baseline: Goal status: met  2.  Pt will demo 18/20 sentences with WNL volume Baseline:  Goal status: met  3.  Pt will demo WNL volume in 5 minutes conversation Baseline:  Goal status: met  4.  Pt will complete dysphagia HEP (if indicated following MBS) with rare min A Baseline:  Goal status: INITIAL   LONG TERM GOALS: Target date: 02/06/24  Pt will have improved PROM Baseline:  Goal status: INITIAL  2.  Pt will demo WNL volume in  10 minutes conversation, in 3 sessions Baseline:  Goal status: modified  3.  Pt will complete dysphagia HEP (if indicated following MBS) with mod I in 3 sessions Baseline:  Goal status: INITIAL  4.  Pt will demo knowledge about where to find reputable info about speech/language info with Parkinson's on the internet Baseline:  Goal status: INITIAL   ASSESSMENT:  CLINICAL IMPRESSION: SLP modified LTG #2 today. Patient is a 73 y.o. M who was seen today for treatment of swallowing and/or speech and language in light of PD. See treatment date above for today's date for further details on today's session. Pt cont making good gains on improving loudness.. On date of eval he demonstrated incr'd volume and intent with short conversation today proving to be a good candidate for skilled ST. Wife states pt's volume is such that she has to ask him to repeat more often than she had to 5-6 years ago.    OBJECTIVE IMPAIRMENTS: Objective impairments include dysarthria and dysphagia. These impairments are limiting patient from household responsibilities, ADLs/IADLs, effectively communicating at home and in community, and safety when swallowing.Factors affecting potential to achieve goals and functional outcome are none noted today. Patient will benefit from skilled SLP services to address above impairments and improve overall function.  REHAB POTENTIAL: Good  PLAN:  SLP FREQUENCY: 2x/week  SLP DURATION: 8 weeks  PLANNED INTERVENTIONS: Aspiration precaution training, Pharyngeal strengthening exercises, Diet toleration management , Cueing hierachy, Internal/external aids, Oral motor exercises, Functional tasks, SLP instruction and feedback, Compensatory strategies, Patient/family education, [phone] Treatment of speech (30 or 45 min) , and [id number] Treatment of swallowing function    Johnnathan Hagemeister, CCC-SLP 01/21/2024, 9:40 AM

## 2024-01-21 NOTE — Therapy (Signed)
 OUTPATIENT OCCUPATIONAL THERAPY PARKINSON'S TREATMENT  Patient Name: Tyler [name] MRN: [id number] DOB:02/08/1951, 73 y.o., male Today's Date: 01/21/2024  PCP: Janey Santos REFERRING PROVIDER: Evonnie Asberry RAMAN, DO  END OF SESSION:   OT End of Session - 01/21/24 1018     Visit Number 8    Number of Visits 17    Date for Recertification  02/19/24    Authorization Type [employer]-VL:[place]              Past Medical History:  Diagnosis Date   Adenomatous polyp of colon 02/2004   Anemia    Anxiety    Arthritis    BPH (benign prostatic hyperplasia)    Colonic varices    Degenerative joint disease    Depression    Depression    Fatty liver    GERD (gastroesophageal reflux disease)    Heart murmur    Hemorrhoids    Hyperlipidemia    Hypertension    IBS (irritable bowel syndrome)    Prostate cancer (HCC) 2011   S/P minimally invasive mitral valve repair 04/11/2020   Complex valvuloplasty including artificial Gore-tex neochord placement x8 with suture plication of posterior commissure and 30 mm Sorin Memo 3D ring annuloplasty via right mini thoracotomy approach   Thrombocytopenia    Thyroid  nodule    Past Surgical History:  Procedure Laterality Date   BUBBLE STUDY  12/22/2019   Procedure: BUBBLE STUDY;  Surgeon: Barbaraann Darryle Ned, MD;  Location: [facility] ENDOSCOPY;  Service: Cardiovascular;;   CARDIAC CATHETERIZATION     COLONOSCOPY     MASS EXCISION Right 05/14/2021   Procedure: REPAIR RIGHT CHEST WALL HERNIA WITH MESH;  Surgeon: Kerrin Elspeth BROCKS, MD;  Location: [facility] OR;  Service: Thoracic;  Laterality: Right;   MITRAL VALVE REPAIR Right 04/11/2020   Procedure: MINIMALLY INVASIVE MITRAL VALVE REPAIR (MVR) USING MEMO 4D RING SIZE ;  Surgeon: Dusty Sudie DEL, MD;  Location: [facility] OR;  Service: Open Heart Surgery;  Laterality: Right;   PROSTATECTOMY     RIGHT/LEFT HEART CATH AND CORONARY ANGIOGRAPHY N/A 12/22/2019   Procedure: RIGHT/LEFT HEART CATH AND  CORONARY ANGIOGRAPHY;  Surgeon: Wonda Sharper, MD;  Location: [facility] INVASIVE CV LAB;  Service: Cardiovascular;  Laterality: N/A;   TEE WITHOUT CARDIOVERSION N/A 12/22/2019   Procedure: TRANSESOPHAGEAL ECHOCARDIOGRAM (TEE);  Surgeon: Barbaraann Darryle Ned, MD;  Location: [facility] ENDOSCOPY;  Service: Cardiovascular;  Laterality: N/A;   TEE WITHOUT CARDIOVERSION N/A 04/11/2020   Procedure: TRANSESOPHAGEAL ECHOCARDIOGRAM (TEE);  Surgeon: Dusty Sudie DEL, MD;  Location: [facility] OR;  Service: Open Heart Surgery;  Laterality: N/A;   TONSILLECTOMY     TOOTH EXTRACTION  03/07/2020   TYMPANOPLASTY  01/2016   Patient Active Problem List   Diagnosis Date Noted   Incisional hernia 05/14/2021   S/P minimally invasive mitral valve repair 04/11/2020   Mitral regurgitation 12/20/2019   VARICES OF OTHER SITES 12/30/2007   History of colonic polyps 12/25/2007    ONSET DATE: 12/09/2023 referral date; diagnosed with PD in 10/2022  REFERRING DIAG:  Diagnosis  G20.A1 (ICD-10-CM) - Parkinson's disease without dyskinesia or fluctuating manifestations (HCC)    THERAPY DIAG:  Other lack of coordination  Muscle weakness (generalized)  Rationale for Evaluation and Treatment: Rehabilitation  SUBJECTIVE:   SUBJECTIVE STATEMENT: Pt reports L big toe is doing a little better, hopes to see some progress on it soon so he can get back on the treadmill.  Pt accompanied by: self  PERTINENT HISTORY: hx of mitral valve replacement, prostate  cancer  PRECAUTIONS: Other: mitral valve repair with ring, fall, cancer hx, HTN  WEIGHT BEARING RESTRICTIONS: No  PAIN:  Are you having pain? Yes: NPRS scale: 2/10 Pain location: L big toe Pain description: Had surgery on it for ingrown toenail Aggravating factors: Excessive movement Relieving factors: rest, medication, ice  FALLS: Has patient fallen in last 6 months? Yes. Number of falls 1, treadmill  LIVING ENVIRONMENT: Lives with: lives with their spouse Lives in:  House/apartment Stairs: stairs inside/outside, does have chair lift available if needed Has following equipment at home: Wheelchair (manual), shower chair, and Grab bars  PLOF: Independent  PATIENT GOALS: I want to maintain function as long as possible. Several years ago my strength kind of went away.   OBJECTIVE:  Note: Objective measures were completed at Evaluation unless otherwise noted.  HAND DOMINANCE: Right  ADLs: Overall ADLs: Reports independence but some tasks that require FM coordination takes longer Transfers/ambulation related to ADLs: I/Mod I Eating: Independent Grooming: Independent UB Dressing: Difficulty buttoning LB Dressing: Difficulty buttoning pants Toileting: Independent Bathing:  Mod Independent Tub Shower transfers: Mod Independent Equipment: Shower seat with back, Grab bars, and Walk in shower  IADLs: Shopping: Wife usually completes even before diagnosis Light housekeeping: Completing laundry and washes dishes  Meal Prep: wife completes cooking even before PD Community mobility: Driving Medication management: Completes Landscape architect: Independent Handwriting: 100% legible and Mild micrographia  MOBILITY STATUS: Independent and Hx of falls  POSTURE COMMENTS:  No Significant postural limitations  ACTIVITY TOLERANCE: Activity tolerance: Pt reports no changes  FUNCTIONAL OUTCOME MEASURES: Fastening/unfastening 3 buttons: 34.54 seconds Physical performance test: PPT#2 (simulated eating) 17.50 seconds & PPT#4 (donning/doffing jacket): 23.04 seconds    01/19/24   COORDINATION: 9 Hole Peg test: Right: 40.30 sec; Left: 38.43 sec Box and Blocks:  Right 32 blocks, Left 38 blocks (Did at first bring R hand around standing piece but corrected with verbal cue)  UE ROM:  WFL  UE MMT:   4/5 grossly  SENSATION:  WFL; however reports occasional numbness in thenar eminence of R thumb, reports doctor said it's most likely d/t overuse. Was a  engineer, civil (consulting) for work.  MUSCLE TONE: RUE: Within functional limits and LUE: Within functional limits  COGNITION: Overall cognitive status: Within functional limits for tasks assessed  OBSERVATIONS: decreased strength, decreased FM coordination especially in R dominant hand, difficulty with ADLs especially buttoning/zipping up jacket, pt reports micrographia when writing longer sentences as well                                                                                                                    TREATMENT DATE:  01/21/24: Added onto putty HEP, see Pt instructions and HEP for details.  Pt encouraged to obtain a 3-ring 1 inch binder with sheet protectors and dividers to organize handouts given in PT/OT/SLP. Offered to organize notebook at next tx with this OT or collaborating OT. Pt agreed. Coordination: Engaged in tip to tip pinch inserting and removing pegs with notches  from holes in various angles. Finally engaged in block stacking activity honing functional reach and tip to tip pinch.   01/19/24 PSFS: see above for score.  Pt reporting that opening jars and bottles still continue to be challenging. Pt did receive jar and bottle openers but reporting needing additional practice. AE: OT providing pt with built up handle with slit to allow pt to easily place on and off of jar and bottle opener.  Pt then opening jar and water bottle with both with additional of foam handle, reporting decreased pressure through palm of hand.   Grip strengthening: OT provided pt with red theraputty for grip strengthening.  Engaged in various movements to simulate grip and pinch as needed to open various jars, bottles, and cans.  Provided with handouts as well as encouraging pt to trial various movements as carried over to functional tasks.   - Putty Squeezes  - Finger Pinch and Pull with Putty   - Wrist Flexion and Extension with putty   01/14/24 Re-assessed Box and Blocks and PPTS  (doing/undoing buttons and putting on/taking off jacket). See goals for updated measurements.  Reviewed with pt how to utilize button hooks for doing/undoing buttons. D/t improvements noted in PPT advised pt to use button hook for fastening buttons only and use hands to undo buttons at this time. Did show pt how to undo buttons with button hook as well.  Assessed handwriting, pt wrote a short paragraph. 100% legibility, noted mild decrease in spacing and mild micrographia. Recommended gel pens rather than roller ball pens for improved control and to continue completing hand flicks before and during handwriting prn.  Finally practiced use of bottle opener and jar opener. Pt demonstrates good understanding and reports he has ordered some of his own that are on the way.  01/12/24 Coordination: engaged in picking up small pegs one at a time with RUE to complete peg board pattern.  Pt demonstrating good, intentional movements, when picking up and rotating pegs to place into pegboard.  OT challenging pt to remove pegs one at a time and hold in palm before transferring back to finger tips to place into container.  Pt demonstrating mild difficulty with translation but good effort.   Handwriting: Provided with handout of recommendations from [facility] to aid in legibility with handwriting.  Engaged in handwriting with various styles of pens with focus on legibility and sizing. OT educating on use of print font, lined paper, and various pen styles for improved handwriting. Reiterated use of large amplitude hand flicks prior to or during handwriting. 9 hole peg test: right: 37.41 sec, left: 38.38 sec.  Pt demonstrating 3 second improvement with RUE.   PATIENT EDUCATION: Education details: handwriting, coordination, large amplitude hands Person educated: Patient Education method: Explanation, Demonstration, and Handouts Education comprehension: verbalized understanding, returned demonstration, and  verbal cues required  HOME EXERCISE PROGRAM: 12/31/23 - Seated Shoulder Flexion with Self-Anchored Resistance  - 1 x daily - 4 x weekly - 3 sets - 10 reps - Seated Shoulder Horizontal Abduction with Resistance  - 1 x daily - 4 x weekly - 3 sets - 10 reps - Seated Elbow Flexion with Self-Anchored Resistance  - 1 x daily - 4 x weekly - 3 sets - 10 reps - Seated Elbow Extension with Self-Anchored Resistance  - 1 x daily - 4 x weekly - 3 sets - 10 reps  Access Code: [email or web address] URL: [email or web address] Date: 01/19/2024 Prepared by: [facility] - Outpatient  Rehab - [facility]  Exercises - Seated Gripping Towel  - 1 sets - 10 reps - Putty Squeezes  - 1 sets - 10 reps - Finger Pinch and Pull with Putty  - 1 sets - 10 reps - Wrist Flexion and Extension with Resistance Bar (putty)  - 1 sets - 10 reps  Access Code: [email or web address] URL: [email or web address] Date: 01/21/2024 Prepared by: Rocky Dutch  Exercises - Tip Pinch with Putty  - 1 x daily - 4 x weekly - 3 sets - 10 reps - 3-Point Pinch with Putty  - 1 x daily - 4 x weekly - 3 sets - 10 reps - Key Pinch with Putty  - 1 x daily - 4 x weekly - 1 sets - 10 reps - Finger Lumbricals with Putty  - 1 x daily - 4 x weekly - 3 sets - 10 reps - Removing Marbles from Putty  - 1 x daily - 4 x weekly - 1 sets  GOALS: Goals reviewed with patient? Yes  SHORT TERM GOALS: Target date: 01/21/24  Pt will verbalize understanding of adaptive strategies to increase ease with ADLS/IADLS   Baseline: New to OP OT Goal status: in progress  2.  Pt will demonstrate improved ease with fastening buttons as evidenced by decreasing 3 button/unbutton time by at least 5 seconds  Baseline: 34.54 seconds 01/14/24: 25.66 seconds Goal status: GOAL MET  3.  Pt will demonstrate improved FM coordination and functional use of RUE by increasing Box and Blocks score by at least 3 blocks Baseline: Right 32 blocks, Left 38 blocks 01/14/24: 31  blocks R hand Goal status: in progress  4.  Pt will be independent with HEPs for UE strength and FM coordination with handouts for reference prn  Baseline: New to OP OT  Goal status: in progress  5. Pt will write short paragraph with 75% legibility and mild to no micrographia Baseline:  Wrote short sentence ([place]) with 100% legible and Mild micrographia. Pt reports micrographia worsens the longer he writes. Goal status: in progress  6. Pt will demonstrate an improved average PSFS score of at least 2 points, or individual score improvement of 3 points   Baseline:   01/19/24: 5.0  Goal status: in progress   LONG TERM GOALS: Target date: 02/18/24  Pt will write short paragraph with 100% legibility and mild to no micrographia Baseline:  Wrote short sentence ([place]) with 100% legible and Mild micrographia. Pt reports micrographia worsens the longer he writes. Goal status: in progress  2.  Pt will demonstrate improved fine motor coordination for ADLs as evidenced by decreasing 9 hole peg test score for R hand by at least 3 secs  Baseline: Right: 40.30 sec; Left: 38.43 sec Goal status: in progress  3.  Pt will demonstrate increased ease with dressing as evidenced by decreasing PPT#4 (don/ doff jacket) to 20 secs or less  Baseline: PPT#4 (donning/doffing jacket): 23.04 seconds 01/14/24: 12.18 seconds  Goal status: GOAL MET   4.  Pt will verbalize understanding of ways to prevent future PD related complications and PD community resources. Baseline: Initiated with [employer] classes Goal status: in progress   5.  Pt will demonstrate improved FM coordination and functional use of RUE by increasing Box and Blocks score by at least 6 blocks Baseline: Right 32 blocks, Left 38 blocks 01/14/24: 31 blocks  Goal status: in progress   ASSESSMENT:  CLINICAL IMPRESSION: Patient is a 73 y.o. male  who was seen today for occupational therapy tx for PD  management. Pt participating well with exercises and activities issued to maintain function secondary to PD. Pt reports improved ease with opening bottles with bottle openers, receptive to all HEPs and recommendations given. Patient will continue to benefit from skilled therapy services to better manage occupational barriers secondary to Parkinson's and improve independence and safety with ADLs and IADLs.   PERFORMANCE DEFICITS: in functional skills including ADLs, IADLs, coordination, dexterity, strength, Fine motor control, balance, cardiopulmonary status limiting function, and UE functional use, cognitive skills including safety awareness and sequencing, and psychosocial skills including coping strategies and environmental adaptation.   IMPAIRMENTS: are limiting patient from ADLs, IADLs, and leisure.     PLAN:  OT FREQUENCY: 2x/week  OT DURATION: 8 weeks  PLANNED INTERVENTIONS: 97168 OT Re-evaluation, 97535 self care/ADL training, 02889 therapeutic exercise, 97530 therapeutic activity, 97112 neuromuscular re-education, 97140 manual therapy, passive range of motion, functional mobility training, energy conservation, coping strategies training, patient/family education, and DME and/or AE instructions  RECOMMENDED OTHER SERVICES: none noted   CONSULTED AND AGREED WITH PLAN OF CARE: Patient  PLAN FOR NEXT SESSION:  Coordination activities Grip strengthening Continue BIG movements Further AE recommendations prn Assemble memory notebook     [place], OTR/L 01/21/2024, 10:18 AM  [facility] at [facility] [address] [place], [place] [address] Phone # [phone] Fax # [phone]

## 2024-01-22 MED ORDER — LISINOPRIL 2.5 MG PO TABS: 2.5000 mg | ORAL_TABLET | Freq: Every day | ORAL | 0 refills | Status: DC

## 2024-01-22 NOTE — Addendum Note (Signed)
 Addended by: BLUFORD, Darianne Muralles L on: 01/22/2024 08:41 AM   Modules accepted: Orders

## 2024-01-23 NOTE — Therapy (Signed)
 OUTPATIENT PHYSICAL THERAPY NEURO TREATMENT NOTE   Patient Name: Tyler [name] MRN: [id number] DOB:[birth year], 73 y.o., male Today's Date: 01/26/2024   PCP: Janey Santos, MD REFERRING PROVIDER: Evonnie Asberry RAMAN, DO      END OF SESSION:  PT End of Session - 01/26/24 1059     Visit Number 11    Number of Visits 14    Date for Recertification  02/10/24    Authorization Type [employer]    PT Start Time 1019    PT Stop Time 1100    PT Time Calculation (min) 41 min    Equipment Utilized During Treatment Gait belt    Activity Tolerance Patient tolerated treatment well    Behavior During Therapy WFL for tasks assessed/performed                Past Medical History:  Diagnosis Date   Adenomatous polyp of colon 02/2004   Anemia    Anxiety    Arthritis    BPH (benign prostatic hyperplasia)    Colonic varices    Degenerative joint disease    Depression    Depression    Fatty liver    GERD (gastroesophageal reflux disease)    Heart murmur    Hemorrhoids    Hyperlipidemia    Hypertension    IBS (irritable bowel syndrome)    Prostate cancer (HCC) 2011   S/P minimally invasive mitral valve repair 04/11/2020   Complex valvuloplasty including artificial Gore-tex neochord placement x8 with suture plication of posterior commissure and 30 mm Sorin Memo 3D ring annuloplasty via right mini thoracotomy approach   Thrombocytopenia    Thyroid  nodule    Past Surgical History:  Procedure Laterality Date   BUBBLE STUDY  12/22/2019   Procedure: BUBBLE STUDY;  Surgeon: Barbaraann Darryle Ned, MD;  Location: [facility] ENDOSCOPY;  Service: Cardiovascular;;   CARDIAC CATHETERIZATION     COLONOSCOPY     MASS EXCISION Right 05/14/2021   Procedure: REPAIR RIGHT CHEST WALL HERNIA WITH MESH;  Surgeon: Kerrin Elspeth BROCKS, MD;  Location: [facility] OR;  Service: Thoracic;  Laterality: Right;   MITRAL VALVE REPAIR Right 04/11/2020   Procedure: MINIMALLY INVASIVE MITRAL VALVE REPAIR (MVR)  USING MEMO 4D RING SIZE ;  Surgeon: Dusty Sudie DEL, MD;  Location: [facility] OR;  Service: Open Heart Surgery;  Laterality: Right;   PROSTATECTOMY     RIGHT/LEFT HEART CATH AND CORONARY ANGIOGRAPHY N/A 12/22/2019   Procedure: RIGHT/LEFT HEART CATH AND CORONARY ANGIOGRAPHY;  Surgeon: Wonda Sharper, MD;  Location: [facility] INVASIVE CV LAB;  Service: Cardiovascular;  Laterality: N/A;   TEE WITHOUT CARDIOVERSION N/A 12/22/2019   Procedure: TRANSESOPHAGEAL ECHOCARDIOGRAM (TEE);  Surgeon: Barbaraann Darryle Ned, MD;  Location: [facility] ENDOSCOPY;  Service: Cardiovascular;  Laterality: N/A;   TEE WITHOUT CARDIOVERSION N/A 04/11/2020   Procedure: TRANSESOPHAGEAL ECHOCARDIOGRAM (TEE);  Surgeon: Dusty Sudie DEL, MD;  Location: [facility] OR;  Service: Open Heart Surgery;  Laterality: N/A;   TONSILLECTOMY     TOOTH EXTRACTION  03/07/2020   TYMPANOPLASTY  01/2016   Patient Active Problem List   Diagnosis Date Noted   Incisional hernia 05/14/2021   S/P minimally invasive mitral valve repair 04/11/2020   Mitral regurgitation 12/20/2019   VARICES OF OTHER SITES 12/30/2007   History of colonic polyps 12/25/2007    ONSET DATE: August 2024  REFERRING DIAG:  G20.A1 (ICD-10-CM) - Parkinson's disease without dyskinesia or fluctuating manifestations (HCC)    THERAPY DIAG:  Unsteadiness on feet  Other abnormalities of gait and  mobility  Rationale for Evaluation and Treatment: Rehabilitation  SUBJECTIVE:                                                                                                                                                                                             SUBJECTIVE STATEMENT: Picked up some leaves this weekend.    Pt accompanied by: self  PERTINENT HISTORY: hx of mitral valve replacement, tendon problem in R shoulder (hx of this, tendon detached from shoulder), hx of L RTC and R RTC issues  PAIN:  Are you having pain? Yes: NPRS scale: 1/10 Pain location: L toe Pain description:  sore Aggravating factors: standing, walking, shoe moving against toe Relieving factors: soaking it  PRECAUTIONS: None  RED FLAGS: None   WEIGHT BEARING RESTRICTIONS:   FALLS: Has patient fallen in last 6 months? one fall a couple weeks ago on his treadmill  LIVING ENVIRONMENT: Lives with: lives with their spouse Lives in: House/apartment Stairs: stairs inside/outside, does have chair lift available if needed Has following equipment at home: None  PLOF: Independent  Exercise routine at home:  treadmill at home x 30 minutes, light weights, and some balance exercises 3-4 days per week.  PATIENT GOALS: balance  OBJECTIVE:     TODAY'S TREATMENT: 01/26/24 Activity Comments  Nustep HIIT 2 min warm up L3 30 sec L6 30 sec L5 1 min L2 1 min L6 30 sec L4 1 min L2 30 sec fast L6 30 sec L4 2 min cooldown L1 Dynamic warm up. Cueing for pacing   multidirectional stepping to targets on floor  Occasional UE support to correct balance   multidirectional stepping to targets on floor, catching bean bands Improved balance after practice        PATIENT EDUCATION: Education details: edu on interval training to improve aerobic fitness and benefits for movement disorders;gave ideas on how pt can use intervals on his treadmill once he returns back to this form of exercise s/p surgery;  discussed plans for joining community PD fitness class- provided handout for [employer] Wednesday PD class  Person educated: Patient Education method: Explanation, Demonstration, Tactile cues, Verbal cues, and Handouts Education comprehension: verbalized understanding     Access Code: [email or web address] URL: [email or web address] Date: 12/17/2023, 12/24/2023 Prepared by: [facility] - Outpatient  Rehab - [facility]  *PWR MOVES STANDING, 10-20 reps 1x/day Exercises - Alternating Step Backward with Support  - 1 x daily - 7 x weekly - 1-2 sets - 10 reps - Goblet Squat with Kettlebell  - 2-3 x  weekly - 3 sets - 10 reps - Sumo Squat with Dumbbell  - 2-3  x weekly - 3 sets - 10 reps - Reverse Lunge  - 2-3 x weekly - 3 sets - 10 reps - Kettlebell Suitcase Carry  - 2-3 x weekly - Standing Bent Over Single Arm Shoulder Row  - 2-3 x weekly - 3 sets - 10 reps  PATIENT EDUCATION: Education details: Rationale for seated exercise today to avoid pain in toe as he is healing from ingrown toenail surgery, options for continued community fitness Person educated: Patient Education method: Explanation, Demonstration, and Handouts Education comprehension: verbalized understanding, returned demonstration, and needs further education  ---------------------------------- Note: Objective measures were completed at Evaluation unless otherwise noted.  DIAGNOSTIC FINDINGS:   COGNITION: Overall cognitive status: Within functional limits for tasks assessed   SENSATION: WFL  COORDINATION: intact  EDEMA:  none  MUSCLE TONE: slight, 1+ bilateral biceps  MUSCLE LENGTH: WNL  DTRs:  NT  POSTURE: No Significant postural limitations  LOWER EXTREMITY ROM:     WNL  LOWER EXTREMITY MMT:    5/5  BED MOBILITY:  indep  TRANSFERS: indep    CURB:  Findings: indep  STAIRS: Not tested--reports independent but relies on HR for security GAIT: Findings: Comments: no deviations, decreased speed  FUNCTIONAL TESTS:  5 times sit to stand: 11 sec 10 meter walk test: 12.13 sec = 2.7 ft/sec Mini-BESTest: 22/28 : 5.3 sec                                                                                                                                TREATMENT DATE: 12/09/23    PATIENT EDUCATION: Education details: discussed relevant exercise intervention strategies/methods for tx of PD, initiated discussion of group classes in community, rationale of interventions Person educated: Patient and Spouse Education method: Explanation Education comprehension: needs further  education  HOME EXERCISE PROGRAM: TBD  GOALS: Goals reviewed with patient? Yes  SHORT TERM GOALS: Target date: 01/06/2024    Patient will be independent in HEP to improve functional outcomes Baseline: Goal status: MET, 01/06/2024  2.  Demo reduced risk for falls per time 4.8 sec 3-Meter Backwards Walk Test Baseline: 5.3 sec>6.37 sec 01/06/2024 Goal status: IN PROGRESS, 01/06/2024  3.  Demo ability to sustain speed of 3.25 ft/sec during ambulation to improve community mobility Baseline: 2.7 ft/sec>4.58 ft/sec Goal status: MET, 01/06/2024   LONG TERM GOALS: Target date: 02/10/2024    Pt to verbalize understanding or participation in community-based classes for people with Parkinson's to improve activity carryover Baseline:  Goal status: IN PROGRESS  2.  Demo improved balance and reduced fall risk per score 25/28 Mini-BESTest Baseline: 22/28 Goal status: IN PROGRESS  3.  Patient to teach-back metrics for defining and executing high intensity training Baseline:  Goal status: IN PROGRESS   ASSESSMENT:  CLINICAL IMPRESSION: Patient arrived to session without new complaints. Session focused on education on how interval training can improve aerobic fitness and movement patterns. Also provided edu on community classes that would be  appropriate for the patient. Good improvement in stability with stepping strategy exercises even with pacing challenges. Patient tolerated session well and without complaints at end of appointment.  OBJECTIVE IMPAIRMENTS: decreased activity tolerance, decreased balance, decreased endurance, decreased knowledge of condition, difficulty walking, and impaired tone.   ACTIVITY LIMITATIONS: carrying, lifting, stairs, and locomotion level  PARTICIPATION LIMITATIONS: community activity, yard work, and exercise routine  PERSONAL FACTORS: Age, Time since onset of injury/illness/exacerbation, and 1 comorbidity: PMH are also affecting patient's functional  outcome.   REHAB POTENTIAL: Excellent  CLINICAL DECISION MAKING: Evolving/moderate complexity  EVALUATION COMPLEXITY: Moderate  PLAN:  PT FREQUENCY: 2x/wk x 4 wks, then 1x/wk x 4 wks  PT DURATION: 8 weeks  PLANNED INTERVENTIONS: 97750- Physical Performance Testing, 97110-Therapeutic exercises, 97530- Therapeutic activity, [id number]- Neuromuscular re-education, 97535- Self Care, [id number]- Manual therapy, [id number]- Gait training, [phone]- Canalith repositioning, [phone]- Aquatic Therapy, and Patient/Family education  PLAN FOR NEXT SESSION: Work on step strategy varied directions, compliant surface balance, high intensity interval training, gait activities and relevant metrics for self-monitoring.   Louana Terrilyn Christians, PT, DPT 01/26/24 11:03 AM   Outpatient Rehab at [facility] [address] [place], [address] [place], [place] [address] Phone # [phone] Fax # [phone]

## 2024-01-26 ENCOUNTER — Other Ambulatory Visit: Payer: Self-pay | Admitting: Cardiovascular Disease

## 2024-01-26 ENCOUNTER — Ambulatory Visit

## 2024-01-26 ENCOUNTER — Encounter: Payer: Self-pay | Admitting: Physical Therapy

## 2024-01-26 ENCOUNTER — Ambulatory Visit: Admitting: Occupational Therapy

## 2024-01-26 ENCOUNTER — Ambulatory Visit: Admitting: Physical Therapy

## 2024-01-26 DIAGNOSIS — R2681 Unsteadiness on feet: Secondary | ICD-10-CM

## 2024-01-26 DIAGNOSIS — R278 Other lack of coordination: Secondary | ICD-10-CM

## 2024-01-26 DIAGNOSIS — R2689 Other abnormalities of gait and mobility: Secondary | ICD-10-CM

## 2024-01-26 DIAGNOSIS — R1312 Dysphagia, oropharyngeal phase: Secondary | ICD-10-CM

## 2024-01-26 DIAGNOSIS — R471 Dysarthria and anarthria: Secondary | ICD-10-CM

## 2024-01-26 DIAGNOSIS — M6281 Muscle weakness (generalized): Secondary | ICD-10-CM

## 2024-01-26 NOTE — Therapy (Signed)
 OUTPATIENT SPEECH LANGUAGE PATHOLOGY [facility]   Patient Name: Tyler [name] MRN: [id number] DOB:21-Aug-1950, 73 y.o., male Today's Date: 01/26/2024  PCP: Janey Santos, MD REFERRING PROVIDER: Evonnie Stabs, DO  END OF SESSION:  End of Session - 01/26/24 0939     Visit Number 9    Number of Visits 17    Date for Recertification  02/06/24    SLP Start Time 0935    SLP Stop Time  1015    SLP Time Calculation (min) 40 min    Activity Tolerance Patient tolerated treatment well              Past Medical History:  Diagnosis Date   Adenomatous polyp of colon 02/2004   Anemia    Anxiety    Arthritis    BPH (benign prostatic hyperplasia)    Colonic varices    Degenerative joint disease    Depression    Depression    Fatty liver    GERD (gastroesophageal reflux disease)    Heart murmur    Hemorrhoids    Hyperlipidemia    Hypertension    IBS (irritable bowel syndrome)    Prostate cancer (HCC) 2011   S/P minimally invasive mitral valve repair 04/11/2020   Complex valvuloplasty including artificial Gore-tex neochord placement x8 with suture plication of posterior commissure and 30 mm Sorin Memo 3D ring annuloplasty via right mini thoracotomy approach   Thrombocytopenia    Thyroid  nodule    Past Surgical History:  Procedure Laterality Date   BUBBLE STUDY  12/22/2019   Procedure: BUBBLE STUDY;  Surgeon: Barbaraann Darryle Ned, MD;  Location: [facility] ENDOSCOPY;  Service: Cardiovascular;;   CARDIAC CATHETERIZATION     COLONOSCOPY     MASS EXCISION Right 05/14/2021   Procedure: REPAIR RIGHT CHEST WALL HERNIA WITH MESH;  Surgeon: Kerrin Elspeth BROCKS, MD;  Location: [facility] OR;  Service: Thoracic;  Laterality: Right;   MITRAL VALVE REPAIR Right 04/11/2020   Procedure: MINIMALLY INVASIVE MITRAL VALVE REPAIR (MVR) USING MEMO 4D RING SIZE ;  Surgeon: Dusty Sudie DEL, MD;  Location: [facility] OR;  Service: Open Heart Surgery;  Laterality: Right;   PROSTATECTOMY     RIGHT/LEFT  HEART CATH AND CORONARY ANGIOGRAPHY N/A 12/22/2019   Procedure: RIGHT/LEFT HEART CATH AND CORONARY ANGIOGRAPHY;  Surgeon: Wonda Sharper, MD;  Location: [facility] INVASIVE CV LAB;  Service: Cardiovascular;  Laterality: N/A;   TEE WITHOUT CARDIOVERSION N/A 12/22/2019   Procedure: TRANSESOPHAGEAL ECHOCARDIOGRAM (TEE);  Surgeon: Barbaraann Darryle Ned, MD;  Location: [facility] ENDOSCOPY;  Service: Cardiovascular;  Laterality: N/A;   TEE WITHOUT CARDIOVERSION N/A 04/11/2020   Procedure: TRANSESOPHAGEAL ECHOCARDIOGRAM (TEE);  Surgeon: Dusty Sudie DEL, MD;  Location: [facility] OR;  Service: Open Heart Surgery;  Laterality: N/A;   TONSILLECTOMY     TOOTH EXTRACTION  03/07/2020   TYMPANOPLASTY  01/2016   Patient Active Problem List   Diagnosis Date Noted   Incisional hernia 05/14/2021   S/P minimally invasive mitral valve repair 04/11/2020   Mitral regurgitation 12/20/2019   VARICES OF OTHER SITES 12/30/2007   History of colonic polyps 12/25/2007    ONSET DATE: diagnosed one year ago; script 11/06/23  REFERRING DIAG: Hypophonia  THERAPY DIAG:  Dysarthria and anarthria  Dysphagia, oropharyngeal phase  Rationale for Evaluation and Treatment: Rehabilitation  SUBJECTIVE:   SUBJECTIVE STATEMENT: Pt repots completing HEPs.  Pt accompanied by: self  PERTINENT HISTORY: Pt dx'd with PD August 2024 with sx prior to that.  PAIN:  Are you having pain? Yes: NPRS  scale: 1/10 Pain location: left toe (first) Pain description: sore Aggravating factors: walking Relieving factors: resting  FALLS: Has patient fallen in last 6 months?  See PT evaluation for details   PATIENT GOALS: Improve speech and swallowing  OBJECTIVE:  Note: Objective measures were completed at Evaluation unless otherwise noted.  MBS 12/22/23: Pt demonstrates a mild oropharyngeal dysphagia consisting of good airway protection, but poor oral efficiency and mild pharyngeal residue. Pt recommended to continue regular diet and thin liquids with  f/u with OP SLP for appropriate strategies.    Pt has inefficient oral phase with unnecessary lingual movement to gather bolus and initiate swallow as well a piecemeal transit and loss of bolus to floor of mouth and buccal cavity. When cued to do so pt can increase efficency and improve bolus cohesion to decrease oral residue and reduce number of swallows gather it and swallow it all at once. Pt also has mild pharyngeal residue with thin liquids, likely due to oral deficits and slightly decreased ROM and duration of swallow. Again with cues for an effortful swallow pt increases these measures and reduces residue. Pt did not aspirate during the study, but expect some penetration or aspiration of oral residue and pharyngeal residue pooling after bites and sips. Also noted bumpy posterior pharyngeal wall and esophageal column against cervical spine, suggesting loss of tissue mass with age/sarcopenia. Pt to f/u with OP SLP to implement strategies. Would not suggest any texture changes.   DIGEST Swallow Severity Rating*             Safety: 0             Efficiency:1             Overall Pharyngeal Swallow Severity: 1 1: mild; 2: moderate; 3: severe; 4: profound  *The Dynamic Imaging Grade of Swallowing Toxicity is standardized for the head and neck cancer population, however, demonstrates promising clinical applications across populations to standardize the clinical rating of pharyngeal swallow safety and severity.   Factors that may increase risk of adverse event in presence of aspiration Noe & Lianne 2021):     Swallow Evaluation Recommendations Recommendations: PO diet PO Diet Recommendation: Regular;Thin liquids (Level 0) Liquid Administration via: Cup;Straw Medication Administration: Whole meds with liquid Supervision: Patient able to self-feed Swallowing strategies  : effortful swallow;Avoid mixed consistencies Postural changes: Position pt fully upright for meals;Stay upright 30-60 min  after meals Oral care recommendations: Oral care BID (2x/day)  PATIENT REPORTED OUTCOME MEASURES (PROM): Communication Participation Item Bank: returned 01/12/24 and pt scored 16/30 with lower scores indicating more barriers to communication due to pt's softer speech. All answers were 1-2 (with options from 0-3).                                                                                                                            TREATMENT DATE:   01/26/24: ROYLENEBETHA Leisure completed HEP with rare min A for hold time for [facility].  He identified hold times that were too short x2. He is completing all exercises but maybe a little less of [place] than others, he stated. SLP encouraged pt to follow suggestions for all exercises. SPEECH: Pt entered with WNL speech volume for 5 minutes conversation. He read his HEP with WNL volume. SLP engaged pt with improving speech loudness by having him provided descriptions that would necessitate 4-5 minute descriptions (bedroom, front yard, etc). Jaquane maintained WNL volume 85-90% of the time. Pt will make concerted effort to practice louder speech more consistently and then use when talking to his wife in the mornings.   01/21/24: SPEECH: SLP guided pt through his HEP for louder more articulate speech. Pt's loud /a/ averaged 86dB, and his sentences were 78dB prior to SLP usual mod-max cues for loudness; then they increased to 82dB. Pt was told to cont with his HEP, and SLP added sentence level responses to cont to make WNL volume speech a habit. In phrase responses pt averaged 69dB which improved to 70dB with initial mod cues faded to rare min cues for loudness. SLP varied cognitive load from minimal to mod-complex. Homework to practice at sentence level 2 15-minute periods or one 20-minute period/day.   01/19/24: SWALLOW: SLP guided pt through his swallowing HEP. Benefited from visual cues of a mirror for [place]; pt agreed. Masako req'd min A faded to  independent. Initial min cues for effortful and then independent. With POs, pt req'd cues for not taking a sip with a solid bolus  but just enough liquid to wet the solid bolus (no extra water in the mouth), min A faded to independent with precautions.  SPEECH: SLP guided pt through his HEP for louder more articulate speech. Pt's loud /a/ averaged 85dB, and his sentences were 81dB prior to SLP mod cues for loudness; then they increased to 83dB. Pt was told to cont with his HEP.   01/14/24: Pt needs swallow HEP done next session.  SPEECH: SLP asked Markeis to cont to look at his schedule and let SLP know how this strategy is going next visit about keeping better organized. Pt's average with loud /a/ was 87dB, and pt wrote 4 more everyday sentences; Average today with these was 79dB. In two conversation segments, one of 8 minutes, one of 9 minutes, and one of 12 minutes pt maintained WNL volume. He noted that being in the ST room, with the sound level meter made it easier to concentrate on loudness and stated he needed to do better at home with increasing his loudness.   11/10/25BETHA Marty told SLP that he feels like he is not as organized with his days as he was previously and wonders if PD has lessened his skills with executive function. SLP suggested he sit down with his medical appointments and to-do list and spend ~5 minutes thinking through his next day. He thanked SLP for this suggestion. With loud /a/ pt's average was 87dB, and SLP assisted pt in developing everyday sentences. He was told to complete 4-5 more sentences until next session.  Average today with these was 77dB and SLP had pt incr loudness and he did, to average 79dB.   01/07/24: CPIB was not returned today. Will bring next time.  SWALLOW: Pt states the HEP for swallowing is difficult. In light of this SLP reviewed pt's HEP with him today. Pt stated his confidence in performing the HEP was better, from 8-9/10 (where 10=100% confident about  procedure). SPEECH: SLP modeled loud /a/ for pt and  provided rationale for loud /a/, and he generated loud /a/ average 86dB. Pt has not been successful with routine completion with these. SLP provided some examples of how to accomplish that. SLP reviewed the rationale for reading out loud. With reading sentences, pt's average was 74dB with initial min A initially. SLP progressed into paragraphs and pt loudness was 71dB with initial cues for intent, deliberate, purposeful speech. For homework, SLP told pt to read out loud for 30 seconds x10 after loud /a/.   01/05/24: CPIB provided to pt today. Discussed/reviewed MBS with pt today. Stressed gather and swallow concept for each swallow was much like swallowing with more intent/purpose like SLP and pt had discussed about his speech in previous session. SLP assisted pt with practice with gather and swallow concept with swallows of water. Pt with more effort with swallow on the swallows he thought gather and swallow was more salient for him. SLP then developed HEP for pt and SLP assessed pt's procedure with these exercises and corrected PRN. Pt to complete at least 5 days/week with 30 reps/day.   12/22/23: NEEDS CPIB NEXT SESSION. Explained the nature of fluoroscopy with pt, and that this machine will record his swallowing with food textures and liquids. Explained about dB being a logarithmic scale. SLP shaped pt's loud /a/ to average 82dB, started at 73dB. Pt read sentences and req'd SLP usual mod-max cues to use intent and purpose with effortful voice. Digital recording provided beneficial auditory feedback of pt's productions. Homework to read out loud 30 seconds x10, and perform 10 loud /a/, BID.  12/09/23: n/a  PATIENT EDUCATION: Education details: See treatment date Person educated: Patient Education method: Explanation, Demonstration, Verbal cues, and Handouts Education comprehension: verbalized understanding, returned demonstration, verbal cues  required, and needs further education  HOME EXERCISE PROGRAM: HEP for louder speech, HEP for dysphagia   GOALS: Goals reviewed with patient? Yes, generally  SHORT TERM GOALS: Target date:  01/26/24 (due to visit count)  Pt will undergo MBS if clinically indicated Baseline: Goal status: met  2.  Pt will demo 18/20 sentences with WNL volume Baseline:  Goal status: met  3.  Pt will demo WNL volume in 5 minutes conversation Baseline:  Goal status: met  4.  Pt will complete dysphagia HEP (if indicated following MBS) with rare min A Baseline:  Goal status: met   LONG TERM GOALS: Target date: 02/06/24  Pt will have improved PROM Baseline:  Goal status: INITIAL  2.  Pt will demo WNL volume in 10 minutes conversation, in 3 sessions Baseline:  Goal status: modified  3.  Pt will complete dysphagia HEP (if indicated following MBS) with mod I in 3 sessions Baseline:  Goal status: INITIAL  4.  Pt will demo knowledge about where to find reputable info about speech/language info with Parkinson's on the internet Baseline:  Goal status: INITIAL   ASSESSMENT:  CLINICAL IMPRESSION: STGs checked today. Patient is a 73 y.o. M who was seen today for treatment of swallowing and/or speech and language in light of PD. See treatment date above for today's date for further details on today's session. Pt cont making good gains on improving loudness.. On date of eval he demonstrated incr'd volume and intent with short conversation today proving to be a good candidate for skilled ST. Wife states pt's volume is such that she has to ask him to repeat more often than she had to 5-6 years ago.    OBJECTIVE IMPAIRMENTS: Objective impairments include dysarthria and dysphagia.  These impairments are limiting patient from household responsibilities, ADLs/IADLs, effectively communicating at home and in community, and safety when swallowing.Factors affecting potential to achieve goals and functional  outcome are none noted today. Patient will benefit from skilled SLP services to address above impairments and improve overall function.  REHAB POTENTIAL: Good  PLAN:  SLP FREQUENCY: 2x/week  SLP DURATION: 8 weeks  PLANNED INTERVENTIONS: Aspiration precaution training, Pharyngeal strengthening exercises, Diet toleration management , Cueing hierachy, Internal/external aids, Oral motor exercises, Functional tasks, SLP instruction and feedback, Compensatory strategies, Patient/family education, [phone] Treatment of speech (30 or 45 min) , and [id number] Treatment of swallowing function    Naimah Yingst, CCC-SLP 01/26/2024, 9:40 AM

## 2024-01-26 NOTE — Therapy (Signed)
 OUTPATIENT OCCUPATIONAL THERAPY PARKINSON'S TREATMENT  Patient Name: Tyler [name] MRN: [id number] DOB:1950/09/05, 73 y.o., male Today's Date: 01/26/2024  PCP: Janey Santos REFERRING PROVIDER: Evonnie Asberry RAMAN, DO  END OF SESSION:   OT End of Session - 01/26/24 1211     Visit Number 9    Number of Visits 17    Date for Recertification  02/19/24    Authorization Type [employer]-VL:[place]    OT Start Time 1147    OT Stop Time 1230    OT Time Calculation (min) 43 min    Activity Tolerance Patient tolerated treatment well    Behavior During Therapy [facility] for tasks assessed/performed               Past Medical History:  Diagnosis Date   Adenomatous polyp of colon 02/2004   Anemia    Anxiety    Arthritis    BPH (benign prostatic hyperplasia)    Colonic varices    Degenerative joint disease    Depression    Depression    Fatty liver    GERD (gastroesophageal reflux disease)    Heart murmur    Hemorrhoids    Hyperlipidemia    Hypertension    IBS (irritable bowel syndrome)    Prostate cancer (HCC) 2011   S/P minimally invasive mitral valve repair 04/11/2020   Complex valvuloplasty including artificial Gore-tex neochord placement x8 with suture plication of posterior commissure and 30 mm Sorin Memo 3D ring annuloplasty via right mini thoracotomy approach   Thrombocytopenia    Thyroid  nodule    Past Surgical History:  Procedure Laterality Date   BUBBLE STUDY  12/22/2019   Procedure: BUBBLE STUDY;  Surgeon: Barbaraann Darryle Ned, MD;  Location: [facility] ENDOSCOPY;  Service: Cardiovascular;;   CARDIAC CATHETERIZATION     COLONOSCOPY     MASS EXCISION Right 05/14/2021   Procedure: REPAIR RIGHT CHEST WALL HERNIA WITH MESH;  Surgeon: Kerrin Elspeth BROCKS, MD;  Location: [facility] OR;  Service: Thoracic;  Laterality: Right;   MITRAL VALVE REPAIR Right 04/11/2020   Procedure: MINIMALLY INVASIVE MITRAL VALVE REPAIR (MVR) USING MEMO 4D RING SIZE ;  Surgeon: Dusty Sudie DEL,  MD;  Location: [facility] OR;  Service: Open Heart Surgery;  Laterality: Right;   PROSTATECTOMY     RIGHT/LEFT HEART CATH AND CORONARY ANGIOGRAPHY N/A 12/22/2019   Procedure: RIGHT/LEFT HEART CATH AND CORONARY ANGIOGRAPHY;  Surgeon: Wonda Sharper, MD;  Location: [facility] INVASIVE CV LAB;  Service: Cardiovascular;  Laterality: N/A;   TEE WITHOUT CARDIOVERSION N/A 12/22/2019   Procedure: TRANSESOPHAGEAL ECHOCARDIOGRAM (TEE);  Surgeon: Barbaraann Darryle Ned, MD;  Location: [facility] ENDOSCOPY;  Service: Cardiovascular;  Laterality: N/A;   TEE WITHOUT CARDIOVERSION N/A 04/11/2020   Procedure: TRANSESOPHAGEAL ECHOCARDIOGRAM (TEE);  Surgeon: Dusty Sudie DEL, MD;  Location: [facility] OR;  Service: Open Heart Surgery;  Laterality: N/A;   TONSILLECTOMY     TOOTH EXTRACTION  03/07/2020   TYMPANOPLASTY  01/2016   Patient Active Problem List   Diagnosis Date Noted   Incisional hernia 05/14/2021   S/P minimally invasive mitral valve repair 04/11/2020   Mitral regurgitation 12/20/2019   VARICES OF OTHER SITES 12/30/2007   History of colonic polyps 12/25/2007    ONSET DATE: 12/09/2023 referral date; diagnosed with PD in 10/2022  REFERRING DIAG:  Diagnosis  G20.A1 (ICD-10-CM) - Parkinson's disease without dyskinesia or fluctuating manifestations (HCC)    THERAPY DIAG:  Other lack of coordination  Muscle weakness (generalized)  Rationale for Evaluation and Treatment: Rehabilitation  SUBJECTIVE:  SUBJECTIVE STATEMENT: Pt reports that things are going pretty well.  Pt reports that he has been doing his coordination exercises.  Pt reports that he is using the jar/bottle opener to aid in opening bottles.  Pt recognizing that he is moving a little faster and easier.    Pt accompanied by: self  PERTINENT HISTORY: hx of mitral valve replacement, prostate cancer  PRECAUTIONS: Other: mitral valve repair with ring, fall, cancer hx, HTN  WEIGHT BEARING RESTRICTIONS: No  PAIN:  Are you having pain? Yes: NPRS scale:  1/10 Pain location: L big toe Pain description: Had surgery on it for ingrown toenail Aggravating factors: Excessive movement Relieving factors: rest, medication, ice  FALLS: Has patient fallen in last 6 months? Yes. Number of falls 1, treadmill  LIVING ENVIRONMENT: Lives with: lives with their spouse Lives in: House/apartment Stairs: stairs inside/outside, does have chair lift available if needed Has following equipment at home: Wheelchair (manual), shower chair, and Grab bars  PLOF: Independent  PATIENT GOALS: I want to maintain function as long as possible. Several years ago my strength kind of went away.   OBJECTIVE:  Note: Objective measures were completed at Evaluation unless otherwise noted.  HAND DOMINANCE: Right  ADLs: Overall ADLs: Reports independence but some tasks that require FM coordination takes longer Transfers/ambulation related to ADLs: I/Mod I Eating: Independent Grooming: Independent UB Dressing: Difficulty buttoning LB Dressing: Difficulty buttoning pants Toileting: Independent Bathing:  Mod Independent Tub Shower transfers: Mod Independent Equipment: Shower seat with back, Grab bars, and Walk in shower  IADLs: Shopping: Wife usually completes even before diagnosis Light housekeeping: Completing laundry and washes dishes  Meal Prep: wife completes cooking even before PD Community mobility: Driving Medication management: Completes Landscape architect: Independent Handwriting: 100% legible and Mild micrographia  MOBILITY STATUS: Independent and Hx of falls  POSTURE COMMENTS:  No Significant postural limitations  ACTIVITY TOLERANCE: Activity tolerance: Pt reports no changes  FUNCTIONAL OUTCOME MEASURES: Fastening/unfastening 3 buttons: 34.54 seconds Physical performance test: PPT#2 (simulated eating) 17.50 seconds & PPT#4 (donning/doffing jacket): 23.04 seconds    01/19/24   COORDINATION: 9 Hole Peg test: Right: 40.30 sec; Left: 38.43  sec Box and Blocks:  Right 32 blocks, Left 38 blocks (Did at first bring R hand around standing piece but corrected with verbal cue)  UE ROM:  WFL  UE MMT:   4/5 grossly  SENSATION:  WFL; however reports occasional numbness in thenar eminence of R thumb, reports doctor said it's most likely d/t overuse. Was a engineer, civil (consulting) for work.  MUSCLE TONE: RUE: Within functional limits and LUE: Within functional limits  COGNITION: Overall cognitive status: Within functional limits for tasks assessed  OBSERVATIONS: decreased strength, decreased FM coordination especially in R dominant hand, difficulty with ADLs especially buttoning/zipping up jacket, pt reports micrographia when writing longer sentences as well  TREATMENT DATE:  01/26/24 GMC: engaged in reaching into moderate height cabinet to remove cups and place onto table top to R with RUE and then L with LUE.  OT providing verbal cues and demonstration for full hand grasp on cup to improve motor control and reduce impact of tremor on motor control.  OT also educating on modifying hand grasp on cup when carrying or drinking from cup for increased motor control.   Coins: engaged in picking up coins one at a time and placing into coin slot with RUE and then LUE.  OT then increasing challenging to holding 5-10 coins in palm and translating to finger tips one at a time to place into coin slot.   Memory notebook: pt reporting that he purchased a 3 ring binder and has organized his exercises into sections for OT/PT/ST.  OT providing recommendations for strategies to keep up with exercises, creating and printing a checklist to keep up with completion as well as recommendation for A day and B day exercises.    01/21/24: Added onto putty HEP, see Pt instructions and HEP for details.  Pt encouraged to obtain a 3-ring 1 inch binder  with sheet protectors and dividers to organize handouts given in PT/OT/SLP. Offered to organize notebook at next tx with this OT or collaborating OT. Pt agreed. Coordination: Engaged in tip to tip pinch inserting and removing pegs with notches from holes in various angles. Finally engaged in block stacking activity honing functional reach and tip to tip pinch.   01/19/24 PSFS: see above for score.  Pt reporting that opening jars and bottles still continue to be challenging. Pt did receive jar and bottle openers but reporting needing additional practice. AE: OT providing pt with built up handle with slit to allow pt to easily place on and off of jar and bottle opener.  Pt then opening jar and water bottle with both with additional of foam handle, reporting decreased pressure through palm of hand.   Grip strengthening: OT provided pt with red theraputty for grip strengthening.  Engaged in various movements to simulate grip and pinch as needed to open various jars, bottles, and cans.  Provided with handouts as well as encouraging pt to trial various movements as carried over to functional tasks.   - Putty Squeezes  - Finger Pinch and Pull with Putty   - Wrist Flexion and Extension with putty    PATIENT EDUCATION: Education details: handwriting, coordination, large amplitude hands Person educated: Patient Education method: Programmer, Multimedia, Demonstration, and Handouts Education comprehension: verbalized understanding, returned demonstration, and verbal cues required  HOME EXERCISE PROGRAM: 12/31/23 - Seated Shoulder Flexion with Self-Anchored Resistance  - 1 x daily - 4 x weekly - 3 sets - 10 reps - Seated Shoulder Horizontal Abduction with Resistance  - 1 x daily - 4 x weekly - 3 sets - 10 reps - Seated Elbow Flexion with Self-Anchored Resistance  - 1 x daily - 4 x weekly - 3 sets - 10 reps - Seated Elbow Extension with Self-Anchored Resistance  - 1 x daily - 4 x weekly - 3 sets - 10 reps  Access  Code: [email or web address] URL: [email or web address] Date: 01/19/2024 Prepared by: [facility] Outpatient  Rehab - [facility]  Exercises - Seated Gripping Towel  - 1 sets - 10 reps - Putty Squeezes  - 1 sets - 10 reps - Finger Pinch and Pull with Putty  - 1 sets - 10 reps - Wrist Flexion and Extension with Resistance Bar (  putty)  - 1 sets - 10 reps  Access Code: [email or web address] URL: [email or web address] Date: 01/21/2024 Prepared by: Rocky Dutch  Exercises - Tip Pinch with Putty  - 1 x daily - 4 x weekly - 3 sets - 10 reps - 3-Point Pinch with Putty  - 1 x daily - 4 x weekly - 3 sets - 10 reps - Key Pinch with Putty  - 1 x daily - 4 x weekly - 1 sets - 10 reps - Finger Lumbricals with Putty  - 1 x daily - 4 x weekly - 3 sets - 10 reps - Removing Marbles from Putty  - 1 x daily - 4 x weekly - 1 sets  GOALS: Goals reviewed with patient? Yes  SHORT TERM GOALS: Target date: 01/21/24  Pt will verbalize understanding of adaptive strategies to increase ease with ADLS/IADLS   Baseline: New to OP OT Goal status: in progress  2.  Pt will demonstrate improved ease with fastening buttons as evidenced by decreasing 3 button/unbutton time by at least 5 seconds  Baseline: 34.54 seconds 01/14/24: 25.66 seconds Goal status: GOAL MET  3.  Pt will demonstrate improved FM coordination and functional use of RUE by increasing Box and Blocks score by at least 3 blocks Baseline: Right 32 blocks, Left 38 blocks 01/14/24: 31 blocks R hand Goal status: in progress  4.  Pt will be independent with HEPs for UE strength and FM coordination with handouts for reference prn  Baseline: New to OP OT  Goal status: in progress  5. Pt will write short paragraph with 75% legibility and mild to no micrographia Baseline:  Wrote short sentence ([place]) with 100% legible and Mild micrographia. Pt reports micrographia worsens the longer he writes. Goal status: in  progress  6. Pt will demonstrate an improved average PSFS score of at least 2 points, or individual score improvement of 3 points   Baseline:   01/19/24: 5.0  Goal status: in progress   LONG TERM GOALS: Target date: 02/18/24  Pt will write short paragraph with 100% legibility and mild to no micrographia Baseline:  Wrote short sentence ([place]) with 100% legible and Mild micrographia. Pt reports micrographia worsens the longer he writes. Goal status: in progress  2.  Pt will demonstrate improved fine motor coordination for ADLs as evidenced by decreasing 9 hole peg test score for R hand by at least 3 secs  Baseline: Right: 40.30 sec; Left: 38.43 sec Goal status: in progress  3.  Pt will demonstrate increased ease with dressing as evidenced by decreasing PPT#4 (don/ doff jacket) to 20 secs or less  Baseline: PPT#4 (donning/doffing jacket): 23.04 seconds 01/14/24: 12.18 seconds  Goal status: GOAL MET   4.  Pt will verbalize understanding of ways to prevent future PD related complications and PD community resources. Baseline: Initiated with [employer] classes Goal status: in progress   5.  Pt will demonstrate improved FM coordination and functional use of RUE by increasing Box and Blocks score by at least 6 blocks Baseline: Right 32 blocks, Left 38 blocks 01/14/24: 31 blocks  Goal status: in progress   ASSESSMENT:  CLINICAL IMPRESSION: Patient is a 73 y.o. male who was seen today for occupational therapy tx for PD management. Pt participating well with exercises and activities this session for improved motor control.  Pt reports improved ease with opening bottles with bottle openers, receptive to all HEPs and recommendations given.  Pt pleased with  check lists created to aid in sequencing, recall, and completion of exercises.  Patient will continue to benefit from skilled therapy services to better manage occupational barriers secondary to Parkinson's and improve  independence and safety with ADLs and IADLs.   PERFORMANCE DEFICITS: in functional skills including ADLs, IADLs, coordination, dexterity, strength, Fine motor control, balance, cardiopulmonary status limiting function, and UE functional use, cognitive skills including safety awareness and sequencing, and psychosocial skills including coping strategies and environmental adaptation.   IMPAIRMENTS: are limiting patient from ADLs, IADLs, and leisure.     PLAN:  OT FREQUENCY: 2x/week  OT DURATION: 8 weeks  PLANNED INTERVENTIONS: 97168 OT Re-evaluation, 97535 self care/ADL training, 02889 therapeutic exercise, 97530 therapeutic activity, 97112 neuromuscular re-education, 97140 manual therapy, passive range of motion, functional mobility training, energy conservation, coping strategies training, patient/family education, and DME and/or AE instructions  RECOMMENDED OTHER SERVICES: none noted   CONSULTED AND AGREED WITH PLAN OF CARE: Patient  PLAN FOR NEXT SESSION:  Coordination activities Grip strengthening Continue BIG movements Further AE recommendations prn Assemble memory notebook prn    KAYLENE DOMINO, OTR/L 01/26/2024, 12:36 PM  [facility] [address], [address] [place], [place] [address] Phone # [phone] Fax # [phone]

## 2024-01-28 ENCOUNTER — Ambulatory Visit

## 2024-01-28 DIAGNOSIS — R471 Dysarthria and anarthria: Secondary | ICD-10-CM

## 2024-01-28 DIAGNOSIS — R1312 Dysphagia, oropharyngeal phase: Secondary | ICD-10-CM

## 2024-01-28 DIAGNOSIS — R2681 Unsteadiness on feet: Secondary | ICD-10-CM

## 2024-01-28 DIAGNOSIS — G20A1 Parkinson's disease without dyskinesia, without mention of fluctuations: Secondary | ICD-10-CM

## 2024-01-28 DIAGNOSIS — R2689 Other abnormalities of gait and mobility: Secondary | ICD-10-CM

## 2024-01-28 DIAGNOSIS — M6281 Muscle weakness (generalized): Secondary | ICD-10-CM

## 2024-01-28 NOTE — Therapy (Signed)
 OUTPATIENT SPEECH LANGUAGE PATHOLOGY [facility]/ PROGRESS NOTE   Patient Name: Tyler [name] MRN: [id number] DOB:[birth year], 73 y.o., male Today's Date: 01/28/2024  PCP: Janey Santos, MD REFERRING PROVIDER: Evonnie Stabs, DO  END OF SESSION:  End of Session - 01/28/24 0939     Visit Number 10    Number of Visits 17    Date for Recertification  02/06/24    SLP Start Time 0937    SLP Stop Time  1017    SLP Time Calculation (min) 40 min    Activity Tolerance Patient tolerated treatment well              Past Medical History:  Diagnosis Date   Adenomatous polyp of colon 02/2004   Anemia    Anxiety    Arthritis    BPH (benign prostatic hyperplasia)    Colonic varices    Degenerative joint disease    Depression    Depression    Fatty liver    GERD (gastroesophageal reflux disease)    Heart murmur    Hemorrhoids    Hyperlipidemia    Hypertension    IBS (irritable bowel syndrome)    Prostate cancer (HCC) 2011   S/P minimally invasive mitral valve repair 04/11/2020   Complex valvuloplasty including artificial Gore-tex neochord placement x8 with suture plication of posterior commissure and 30 mm Sorin Memo 3D ring annuloplasty via right mini thoracotomy approach   Thrombocytopenia    Thyroid  nodule    Past Surgical History:  Procedure Laterality Date   BUBBLE STUDY  12/22/2019   Procedure: BUBBLE STUDY;  Surgeon: Barbaraann Darryle Ned, MD;  Location: [facility] ENDOSCOPY;  Service: Cardiovascular;;   CARDIAC CATHETERIZATION     COLONOSCOPY     MASS EXCISION Right 05/14/2021   Procedure: REPAIR RIGHT CHEST WALL HERNIA WITH MESH;  Surgeon: Kerrin Elspeth BROCKS, MD;  Location: [facility] OR;  Service: Thoracic;  Laterality: Right;   MITRAL VALVE REPAIR Right 04/11/2020   Procedure: MINIMALLY INVASIVE MITRAL VALVE REPAIR (MVR) USING MEMO 4D RING SIZE ;  Surgeon: Dusty Sudie DEL, MD;  Location: [facility] OR;  Service: Open Heart Surgery;  Laterality: Right;   PROSTATECTOMY      RIGHT/LEFT HEART CATH AND CORONARY ANGIOGRAPHY N/A 12/22/2019   Procedure: RIGHT/LEFT HEART CATH AND CORONARY ANGIOGRAPHY;  Surgeon: Wonda Sharper, MD;  Location: [facility] INVASIVE CV LAB;  Service: Cardiovascular;  Laterality: N/A;   TEE WITHOUT CARDIOVERSION N/A 12/22/2019   Procedure: TRANSESOPHAGEAL ECHOCARDIOGRAM (TEE);  Surgeon: Barbaraann Darryle Ned, MD;  Location: [facility] ENDOSCOPY;  Service: Cardiovascular;  Laterality: N/A;   TEE WITHOUT CARDIOVERSION N/A 04/11/2020   Procedure: TRANSESOPHAGEAL ECHOCARDIOGRAM (TEE);  Surgeon: Dusty Sudie DEL, MD;  Location: [facility] OR;  Service: Open Heart Surgery;  Laterality: N/A;   TONSILLECTOMY     TOOTH EXTRACTION  03/07/2020   TYMPANOPLASTY  01/2016   Patient Active Problem List   Diagnosis Date Noted   Incisional hernia 05/14/2021   S/P minimally invasive mitral valve repair 04/11/2020   Mitral regurgitation 12/20/2019   VARICES OF OTHER SITES 12/30/2007   History of colonic polyps 12/25/2007   Speech Therapy Progress Note  Dates of Reporting Period: 12/09/23 to present  Subjective Statement: PT has been seen for 10 ST visits targeting swallowing, and speech volume  Objective: Please see MBS report from 12/22/23 ([email or web address]). Pt has improved his conversational volume in conversation for less than 10 minutes. He is performing HEP for dysphagia more congruent with SLP prescribed frequency and scope in the last  3-4 days.  Goal Update: See below  Plan: See below  Reason Skilled Services are Required: Pt has not yet maxed rehab potential.    ONSET DATE: diagnosed one year ago; script 11/06/23  REFERRING DIAG: Hypophonia  THERAPY DIAG:  Dysarthria and anarthria  Dysphagia, oropharyngeal phase  Rationale for Evaluation and Treatment: Rehabilitation  SUBJECTIVE:   SUBJECTIVE STATEMENT: Pt repots completing HEPs as directed.  Pt accompanied by: self  PERTINENT HISTORY: Pt dx'd with PD August 2024 with sx prior to that.  PAIN:  Are you  having pain? No  FALLS: Has patient fallen in last 6 months?  See PT evaluation for details   PATIENT GOALS: Improve speech and swallowing  OBJECTIVE:  Note: Objective measures were completed at Evaluation unless otherwise noted.  MBS 12/22/23: Pt demonstrates a mild oropharyngeal dysphagia consisting of good airway protection, but poor oral efficiency and mild pharyngeal residue. Pt recommended to continue regular diet and thin liquids with f/u with OP SLP for appropriate strategies.    Pt has inefficient oral phase with unnecessary lingual movement to gather bolus and initiate swallow as well a piecemeal transit and loss of bolus to floor of mouth and buccal cavity. When cued to do so pt can increase efficency and improve bolus cohesion to decrease oral residue and reduce number of swallows gather it and swallow it all at once. Pt also has mild pharyngeal residue with thin liquids, likely due to oral deficits and slightly decreased ROM and duration of swallow. Again with cues for an effortful swallow pt increases these measures and reduces residue. Pt did not aspirate during the study, but expect some penetration or aspiration of oral residue and pharyngeal residue pooling after bites and sips. Also noted bumpy posterior pharyngeal wall and esophageal column against cervical spine, suggesting loss of tissue mass with age/sarcopenia. Pt to f/u with OP SLP to implement strategies. Would not suggest any texture changes.   DIGEST Swallow Severity Rating*             Safety: 0             Efficiency:1             Overall Pharyngeal Swallow Severity: 1 1: mild; 2: moderate; 3: severe; 4: profound  *The Dynamic Imaging Grade of Swallowing Toxicity is standardized for the head and neck cancer population, however, demonstrates promising clinical applications across populations to standardize the clinical rating of pharyngeal swallow safety and severity.   Factors that may increase risk of adverse  event in presence of aspiration Noe & Lianne 2021):     Swallow Evaluation Recommendations Recommendations: PO diet PO Diet Recommendation: Regular;Thin liquids (Level 0) Liquid Administration via: Cup;Straw Medication Administration: Whole meds with liquid Supervision: Patient able to self-feed Swallowing strategies  : effortful swallow;Avoid mixed consistencies Postural changes: Position pt fully upright for meals;Stay upright 30-60 min after meals Oral care recommendations: Oral care BID (2x/day)  PATIENT REPORTED OUTCOME MEASURES (PROM): Communication Participation Item Bank: returned 01/12/24 and pt scored 16/30 with lower scores indicating more barriers to communication due to pt's softer speech. All answers were 1-2 (with options from 0-3).  TREATMENT DATE:   01/28/24: SPEECH: SLP guided pt through practice with deliberate and mindful speech at a WNL volume. Practice in 12 minutes of conversation resulted in average 70dB until 10 minutes, when average decr'd to 68dB. Pt was unsure of the decr in volume until SLP incr'd pt's awareness. In 5 minutes conversation after this, pt maintained WNL volume. SLP told pt to pay attention to people's reactions (or not) to his WNL conversational volume. SLP reiterated that pt is beginning to feel how WNL volume speech production feels. SWALLOWING: SLP guided pt through his swallow HEP and pt with noted fatigue after rep # 6 or 7 on each exercise. SLP told pt to completed at least 5 reps at a time or reps will be insufficient for progress. Pt procedure was independent.   01/26/24: ROYLENEBETHA Leisure completed HEP with rare min A for hold time for [facility]. He identified hold times that were too short x2. He is completing all exercises but maybe a little less of Mendelsohn than others, he stated. SLP encouraged pt to follow  suggestions for all exercises. SPEECH: Pt entered with WNL speech volume for 5 minutes conversation. He read his HEP with WNL volume. SLP engaged pt with improving speech loudness by having him provided descriptions that would necessitate 4-5 minute descriptions (bedroom, front yard, etc). Audi maintained WNL volume 85-90% of the time. Pt will make concerted effort to practice louder speech more consistently and then use when talking to his wife in the mornings.   01/21/24: SPEECH: SLP guided pt through his HEP for louder more articulate speech. Pt's loud /a/ averaged 86dB, and his sentences were 78dB prior to SLP usual mod-max cues for loudness; then they increased to 82dB. Pt was told to cont with his HEP, and SLP added sentence level responses to cont to make WNL volume speech a habit. In phrase responses pt averaged 69dB which improved to 70dB with initial mod cues faded to rare min cues for loudness. SLP varied cognitive load from minimal to mod-complex. Homework to practice at sentence level 2 15-minute periods or one 20-minute period/day.   01/19/24: SWALLOW: SLP guided pt through his swallowing HEP. Benefited from visual cues of a mirror for [place]; pt agreed. Masako req'd min A faded to independent. Initial min cues for effortful and then independent. With POs, pt req'd cues for not taking a sip with a solid bolus  but just enough liquid to wet the solid bolus (no extra water in the mouth), min A faded to independent with precautions.  SPEECH: SLP guided pt through his HEP for louder more articulate speech. Pt's loud /a/ averaged 85dB, and his sentences were 81dB prior to SLP mod cues for loudness; then they increased to 83dB. Pt was told to cont with his HEP.   01/14/24: Pt needs swallow HEP done next session.  SPEECH: SLP asked [place] to cont to look at his schedule and let SLP know how this strategy is going next visit about keeping better organized. Pt's average with loud /a/ was 87dB, and  pt wrote 4 more everyday sentences; Average today with these was 79dB. In two conversation segments, one of 8 minutes, one of 9 minutes, and one of 12 minutes pt maintained WNL volume. He noted that being in the ST room, with the sound level meter made it easier to concentrate on loudness and stated he needed to do better at home with increasing his loudness.   11/10/25BETHA Leisure told SLP that he feels like he  is not as organized with his days as he was previously and wonders if PD has lessened his skills with executive function. SLP suggested he sit down with his medical appointments and to-do list and spend ~5 minutes thinking through his next day. He thanked SLP for this suggestion. With loud /a/ pt's average was 87dB, and SLP assisted pt in developing everyday sentences. He was told to complete 4-5 more sentences until next session.  Average today with these was 77dB and SLP had pt incr loudness and he did, to average 79dB.   01/07/24: CPIB was not returned today. Will bring next time.  SWALLOW: Pt states the HEP for swallowing is difficult. In light of this SLP reviewed pt's HEP with him today. Pt stated his confidence in performing the HEP was better, from 8-9/10 (where 10=100% confident about procedure). SPEECH: SLP modeled loud /a/ for pt and provided rationale for loud /a/, and he generated loud /a/ average 86dB. Pt has not been successful with routine completion with these. SLP provided some examples of how to accomplish that. SLP reviewed the rationale for reading out loud. With reading sentences, pt's average was 74dB with initial min A initially. SLP progressed into paragraphs and pt loudness was 71dB with initial cues for intent, deliberate, purposeful speech. For homework, SLP told pt to read out loud for 30 seconds x10 after loud /a/.   01/05/24: CPIB provided to pt today. Discussed/reviewed MBS with pt today. Stressed gather and swallow concept for each swallow was much like swallowing with  more intent/purpose like SLP and pt had discussed about his speech in previous session. SLP assisted pt with practice with gather and swallow concept with swallows of water. Pt with more effort with swallow on the swallows he thought gather and swallow was more salient for him. SLP then developed HEP for pt and SLP assessed pt's procedure with these exercises and corrected PRN. Pt to complete at least 5 days/week with 30 reps/day.   12/22/23: NEEDS CPIB NEXT SESSION. Explained the nature of fluoroscopy with pt, and that this machine will record his swallowing with food textures and liquids. Explained about dB being a logarithmic scale. SLP shaped pt's loud /a/ to average 82dB, started at 73dB. Pt read sentences and req'd SLP usual mod-max cues to use intent and purpose with effortful voice. Digital recording provided beneficial auditory feedback of pt's productions. Homework to read out loud 30 seconds x10, and perform 10 loud /a/, BID.  12/09/23: n/a  PATIENT EDUCATION: Education details: See treatment date Person educated: Patient Education method: Explanation, Demonstration, Verbal cues, and Handouts Education comprehension: verbalized understanding, returned demonstration, verbal cues required, and needs further education  HOME EXERCISE PROGRAM: HEP for louder speech, HEP for dysphagia   GOALS: Goals reviewed with patient? Yes, generally  SHORT TERM GOALS: Target date:  01/26/24 (due to visit count)  Pt will undergo MBS if clinically indicated Baseline: Goal status: met  2.  Pt will demo 18/20 sentences with WNL volume Baseline:  Goal status: met  3.  Pt will demo WNL volume in 5 minutes conversation Baseline:  Goal status: met  4.  Pt will complete dysphagia HEP (if indicated following MBS) with rare min A Baseline:  Goal status: met   LONG TERM GOALS: Target date: 02/06/24  Pt will have improved PROM Baseline:  Goal status: INITIAL  2.  Pt will demo WNL volume in  10 minutes conversation, in 3 sessions Baseline: 01/28/24 Goal status: modified  3.  Pt will  complete dysphagia HEP (if indicated following MBS) with mod I in 3 sessions Baseline:  Goal status: INITIAL  4.  Pt will demo knowledge about where to find reputable info about speech/language info with Parkinson's on the internet Baseline:  Goal status: INITIAL   ASSESSMENT:  CLINICAL IMPRESSION: Patient is a 73 y.o. M who was seen today for treatment of swallowing and/or speech and language in light of PD. See treatment date above for today's date for further details on today's session. On date of eval he demonstrated incr'd volume and intent with short conversation today proving to be a good candidate for skilled ST. Wife states pt's volume is such that she has to ask him to repeat more often than she had to 5-6 years ago.    OBJECTIVE IMPAIRMENTS: Objective impairments include dysarthria and dysphagia. These impairments are limiting patient from household responsibilities, ADLs/IADLs, effectively communicating at home and in community, and safety when swallowing.Factors affecting potential to achieve goals and functional outcome are none noted today. Patient will benefit from skilled SLP services to address above impairments and improve overall function.  REHAB POTENTIAL: Good  PLAN:  SLP FREQUENCY: 2x/week  SLP DURATION: 8 weeks  PLANNED INTERVENTIONS: Aspiration precaution training, Pharyngeal strengthening exercises, Diet toleration management , Cueing hierachy, Internal/external aids, Oral motor exercises, Functional tasks, SLP instruction and feedback, Compensatory strategies, Patient/family education, [phone] Treatment of speech (30 or 45 min) , and [id number] Treatment of swallowing function    Karey Suthers, CCC-SLP 01/28/2024, 9:40 AM

## 2024-01-28 NOTE — Therapy (Signed)
 OUTPATIENT PHYSICAL THERAPY NEURO TREATMENT NOTE   Patient Name: Tyler [name] MRN: [id number] DOB:1950/04/19, 73 y.o., male Today's Date: 02/02/2024   PCP: Janey Santos, MD REFERRING PROVIDER: Evonnie Asberry RAMAN, DO      END OF SESSION:  PT End of Session - 02/02/24 1058     Visit Number 12    Number of Visits 14    Date for Recertification  02/10/24    Authorization Type [employer]    PT Start Time 1014    PT Stop Time 1058    PT Time Calculation (min) 44 min    Equipment Utilized During Treatment Gait belt    Activity Tolerance Patient tolerated treatment well    Behavior During Therapy WFL for tasks assessed/performed                 Past Medical History:  Diagnosis Date   Adenomatous polyp of colon 02/2004   Anemia    Anxiety    Arthritis    BPH (benign prostatic hyperplasia)    Colonic varices    Degenerative joint disease    Depression    Depression    Fatty liver    GERD (gastroesophageal reflux disease)    Heart murmur    Hemorrhoids    Hyperlipidemia    Hypertension    IBS (irritable bowel syndrome)    Prostate cancer (HCC) 2011   S/P minimally invasive mitral valve repair 04/11/2020   Complex valvuloplasty including artificial Gore-tex neochord placement x8 with suture plication of posterior commissure and 30 mm Sorin Memo 3D ring annuloplasty via right mini thoracotomy approach   Thrombocytopenia    Thyroid  nodule    Past Surgical History:  Procedure Laterality Date   BUBBLE STUDY  12/22/2019   Procedure: BUBBLE STUDY;  Surgeon: Barbaraann Darryle Ned, MD;  Location: [facility] ENDOSCOPY;  Service: Cardiovascular;;   CARDIAC CATHETERIZATION     COLONOSCOPY     MASS EXCISION Right 05/14/2021   Procedure: REPAIR RIGHT CHEST WALL HERNIA WITH MESH;  Surgeon: Kerrin Elspeth BROCKS, MD;  Location: [facility] OR;  Service: Thoracic;  Laterality: Right;   MITRAL VALVE REPAIR Right 04/11/2020   Procedure: MINIMALLY INVASIVE MITRAL VALVE REPAIR (MVR)  USING MEMO 4D RING SIZE ;  Surgeon: Dusty Sudie DEL, MD;  Location: [facility] OR;  Service: Open Heart Surgery;  Laterality: Right;   PROSTATECTOMY     RIGHT/LEFT HEART CATH AND CORONARY ANGIOGRAPHY N/A 12/22/2019   Procedure: RIGHT/LEFT HEART CATH AND CORONARY ANGIOGRAPHY;  Surgeon: Wonda Sharper, MD;  Location: [facility] INVASIVE CV LAB;  Service: Cardiovascular;  Laterality: N/A;   TEE WITHOUT CARDIOVERSION N/A 12/22/2019   Procedure: TRANSESOPHAGEAL ECHOCARDIOGRAM (TEE);  Surgeon: Barbaraann Darryle Ned, MD;  Location: [facility] ENDOSCOPY;  Service: Cardiovascular;  Laterality: N/A;   TEE WITHOUT CARDIOVERSION N/A 04/11/2020   Procedure: TRANSESOPHAGEAL ECHOCARDIOGRAM (TEE);  Surgeon: Dusty Sudie DEL, MD;  Location: [facility] OR;  Service: Open Heart Surgery;  Laterality: N/A;   TONSILLECTOMY     TOOTH EXTRACTION  03/07/2020   TYMPANOPLASTY  01/2016   Patient Active Problem List   Diagnosis Date Noted   Incisional hernia 05/14/2021   S/P minimally invasive mitral valve repair 04/11/2020   Mitral regurgitation 12/20/2019   VARICES OF OTHER SITES 12/30/2007   History of colonic polyps 12/25/2007    ONSET DATE: August 2024  REFERRING DIAG:  G20.A1 (ICD-10-CM) - Parkinson's disease without dyskinesia or fluctuating manifestations (HCC)    THERAPY DIAG:  Unsteadiness on feet  Other abnormalities of gait  and mobility  Rationale for Evaluation and Treatment: Rehabilitation  SUBJECTIVE:                                                                                                                                                                                             SUBJECTIVE STATEMENT: Have my toe wrapped up and not hurting me too much at all today.    Pt accompanied by: self  PERTINENT HISTORY: hx of mitral valve replacement, tendon problem in R shoulder (hx of this, tendon detached from shoulder), hx of L RTC and R RTC issues  PAIN:  Are you having pain? Yes: NPRS scale: 0/10 Pain  location: L toe Pain description: sore Aggravating factors: standing, walking, shoe moving against toe Relieving factors: soaking it  PRECAUTIONS: None  RED FLAGS: None   WEIGHT BEARING RESTRICTIONS:   FALLS: Has patient fallen in last 6 months? one fall a couple weeks ago on his treadmill  LIVING ENVIRONMENT: Lives with: lives with their spouse Lives in: House/apartment Stairs: stairs inside/outside, does have chair lift available if needed Has following equipment at home: None  PLOF: Independent  Exercise routine at home:  treadmill at home x 30 minutes, light weights, and some balance exercises 3-4 days per week.  PATIENT GOALS: balance  OBJECTIVE:    TODAY'S TREATMENT: 02/02/24 Activity Comments  fwd step on foam + UE reach Mirror feedback;  warmed up with version without foam first; slower and mild instability when adding foam  side step on foam + reach Mirror feedback;  warmed up with version without foam first; slower and mild instability when adding foam. Added 50bpm metronome for pacing   backwards step + reach [employer];  Added 50bpm metronome for pacing   standing on foam:  romberg, then with head turns/nods standing EC, then with head turns/nods ball toss overhead/behind back ball pass Mild-mod sway with head turns; CGA  Standing on foam balance beam, quick stepping to targets on floor  Occasional 1 UE support on chair  [place] + ball toss  Good coordination     PATIENT EDUCATION: Education details: recommended use of metronome or playing music while performing PWR moves for increased challenge/pacing  Person educated: Patient Education method: Medical Illustrator Education comprehension: verbalized understanding and returned demonstration   Access Code: [email or web address] URL: [email or web address] Date: 12/17/2023, 12/24/2023 Prepared by: [facility] - [facility]  *PWR MOVES STANDING, 10-20 reps  1x/day Exercises - Alternating Step Backward with Support  - 1 x daily - 7 x weekly - 1-2 sets - 10 reps - Goblet Squat with Kettlebell  - 2-3  x weekly - 3 sets - 10 reps - Sumo Squat with Dumbbell  - 2-3 x weekly - 3 sets - 10 reps - Reverse Lunge  - 2-3 x weekly - 3 sets - 10 reps - Kettlebell Suitcase Carry  - 2-3 x weekly - Standing Bent Over Single Arm Shoulder Row  - 2-3 x weekly - 3 sets - 10 reps    ---------------------------------- Note: Objective measures were completed at Evaluation unless otherwise noted.  DIAGNOSTIC FINDINGS:   COGNITION: Overall cognitive status: Within functional limits for tasks assessed   SENSATION: WFL  COORDINATION: intact  EDEMA:  none  MUSCLE TONE: slight, 1+ bilateral biceps  MUSCLE LENGTH: WNL  DTRs:  NT  POSTURE: No Significant postural limitations  LOWER EXTREMITY ROM:     WNL  LOWER EXTREMITY MMT:    5/5  BED MOBILITY:  indep  TRANSFERS: indep    CURB:  Findings: indep  STAIRS: Not tested--reports independent but relies on HR for security GAIT: Findings: Comments: no deviations, decreased speed  FUNCTIONAL TESTS:  5 times sit to stand: 11 sec 10 meter walk test: 12.13 sec = 2.7 ft/sec Mini-BESTest: 22/28 : 5.3 sec                                                                                                                                TREATMENT DATE: 12/09/23    PATIENT EDUCATION: Education details: discussed relevant exercise intervention strategies/methods for tx of PD, initiated discussion of group classes in community, rationale of interventions Person educated: Patient and Spouse Education method: Explanation Education comprehension: needs further education  HOME EXERCISE PROGRAM: TBD  GOALS: Goals reviewed with patient? Yes  SHORT TERM GOALS: Target date: 01/06/2024    Patient will be independent in HEP to improve functional outcomes Baseline: Goal status: MET,  01/06/2024  2.  Demo reduced risk for falls per time 4.8 sec 3-Meter Backwards Walk Test Baseline: 5.3 sec>6.37 sec 01/06/2024 Goal status: IN PROGRESS, 01/06/2024  3.  Demo ability to sustain speed of 3.25 ft/sec during ambulation to improve community mobility Baseline: 2.7 ft/sec>4.58 ft/sec Goal status: MET, 01/06/2024   LONG TERM GOALS: Target date: 02/10/2024    Pt to verbalize understanding or participation in community-based classes for people with Parkinson's to improve activity carryover Baseline:  Goal status: IN PROGRESS  2.  Demo improved balance and reduced fall risk per score 25/28 Mini-BESTest Baseline: 22/28 Goal status: IN PROGRESS  3.  Patient to teach-back metrics for defining and executing high intensity training Baseline:  Goal status: IN PROGRESS   ASSESSMENT:  CLINICAL IMPRESSION: Patient arrived to session without complaints. Session focused on compliant surface balance training. Patient responded well to use of metronome for pacing of movements. Slightly more sway evident with head turn activities today. Patient tolerated session well and without complaints at end of appointment.  OBJECTIVE IMPAIRMENTS: decreased activity tolerance, decreased balance, decreased endurance, decreased knowledge of condition, difficulty walking,  and impaired tone.   ACTIVITY LIMITATIONS: carrying, lifting, stairs, and locomotion level  PARTICIPATION LIMITATIONS: community activity, yard work, and exercise routine  PERSONAL FACTORS: Age, Time since onset of injury/illness/exacerbation, and 1 comorbidity: PMH are also affecting patient's functional outcome.   REHAB POTENTIAL: Excellent  CLINICAL DECISION MAKING: Evolving/moderate complexity  EVALUATION COMPLEXITY: Moderate  PLAN:  PT FREQUENCY: 2x/wk x 4 wks, then 1x/wk x 4 wks  PT DURATION: 8 weeks  PLANNED INTERVENTIONS: 97750- Physical Performance Testing, 97110-Therapeutic exercises, 97530- Therapeutic activity,  [id number]- Neuromuscular re-education, 97535- Self Care, [id number]- Manual therapy, [id number]- Gait training, [phone]- Canalith repositioning, [phone]- Aquatic Therapy, and Patient/Family education  PLAN FOR NEXT SESSION: Work on step strategy varied directions, compliant surface balance, high intensity interval training, gait activities and relevant metrics for self-monitoring.   Louana Terrilyn Christians, PT, DPT 02/02/24 10:59 AM  [facility] at [facility] [address] [place], [address] [place], [place] [address] Phone # [phone] Fax # [phone]

## 2024-01-28 NOTE — Therapy (Signed)
 OUTPATIENT OCCUPATIONAL THERAPY PARKINSON'S TREATMENT  Patient Name: Tyler [name] MRN: [id number] DOB:December 29, 1950, 73 y.o., male Today's Date: 01/28/2024  PCP: Janey Santos REFERRING PROVIDER: Evonnie Asberry RAMAN, DO  END OF SESSION:   OT End of Session - 01/28/24 1054     Visit Number 10    Number of Visits 17    Date for Recertification  02/19/24    Authorization Type [employer]-VL:[place]    OT Start Time 1020    OT Stop Time 1100    OT Time Calculation (min) 40 min    Equipment Utilized During Treatment clamp and pegs    Activity Tolerance Patient tolerated treatment well    Behavior During Therapy WFL for tasks assessed/performed               Past Medical History:  Diagnosis Date   Adenomatous polyp of colon 02/2004   Anemia    Anxiety    Arthritis    BPH (benign prostatic hyperplasia)    Colonic varices    Degenerative joint disease    Depression    Depression    Fatty liver    GERD (gastroesophageal reflux disease)    Heart murmur    Hemorrhoids    Hyperlipidemia    Hypertension    IBS (irritable bowel syndrome)    Prostate cancer (HCC) 2011   S/P minimally invasive mitral valve repair 04/11/2020   Complex valvuloplasty including artificial Gore-tex neochord placement x8 with suture plication of posterior commissure and 30 mm Sorin Memo 3D ring annuloplasty via right mini thoracotomy approach   Thrombocytopenia    Thyroid  nodule    Past Surgical History:  Procedure Laterality Date   BUBBLE STUDY  12/22/2019   Procedure: BUBBLE STUDY;  Surgeon: Barbaraann Darryle Ned, MD;  Location: [facility] ENDOSCOPY;  Service: Cardiovascular;;   CARDIAC CATHETERIZATION     COLONOSCOPY     MASS EXCISION Right 05/14/2021   Procedure: REPAIR RIGHT CHEST WALL HERNIA WITH MESH;  Surgeon: Kerrin Elspeth BROCKS, MD;  Location: [facility] OR;  Service: Thoracic;  Laterality: Right;   MITRAL VALVE REPAIR Right 04/11/2020   Procedure: MINIMALLY INVASIVE MITRAL VALVE REPAIR (MVR)  USING MEMO 4D RING SIZE ;  Surgeon: Dusty Sudie DEL, MD;  Location: [facility] [place] OR;  Service: Open Heart Surgery;  Laterality: Right;   PROSTATECTOMY     RIGHT/LEFT HEART CATH AND CORONARY ANGIOGRAPHY N/A 12/22/2019   Procedure: RIGHT/LEFT HEART CATH AND CORONARY ANGIOGRAPHY;  Surgeon: Wonda Sharper, MD;  Location: [facility] INVASIVE CV LAB;  Service: Cardiovascular;  Laterality: N/A;   TEE WITHOUT CARDIOVERSION N/A 12/22/2019   Procedure: TRANSESOPHAGEAL ECHOCARDIOGRAM (TEE);  Surgeon: Barbaraann Darryle Ned, MD;  Location: [facility] ENDOSCOPY;  Service: Cardiovascular;  Laterality: N/A;   TEE WITHOUT CARDIOVERSION N/A 04/11/2020   Procedure: TRANSESOPHAGEAL ECHOCARDIOGRAM (TEE);  Surgeon: Dusty Sudie DEL, MD;  Location: [facility] OR;  Service: Open Heart Surgery;  Laterality: N/A;   TONSILLECTOMY     TOOTH EXTRACTION  03/07/2020   TYMPANOPLASTY  01/2016   Patient Active Problem List   Diagnosis Date Noted   Incisional hernia 05/14/2021   S/P minimally invasive mitral valve repair 04/11/2020   Mitral regurgitation 12/20/2019   VARICES OF OTHER SITES 12/30/2007   History of colonic polyps 12/25/2007    ONSET DATE: 12/09/2023 referral date; diagnosed with PD in 10/2022  REFERRING DIAG:  Diagnosis  G20.A1 (ICD-10-CM) - Parkinson's disease without dyskinesia or fluctuating manifestations (HCC)    THERAPY DIAG:  Muscle weakness (generalized)  Unsteadiness on feet  Other abnormalities of gait and mobility  Parkinson's disease without dyskinesia or fluctuating manifestations (HCC)  Rationale for Evaluation and Treatment: Rehabilitation  SUBJECTIVE:   SUBJECTIVE STATEMENT: Pt reports continued participation with exercises, stated that he feels the putty exercises are especially helpful.  Pt accompanied by: self  PERTINENT HISTORY: hx of mitral valve replacement, prostate cancer  PRECAUTIONS: Other: mitral valve repair with ring, fall, cancer hx, HTN  WEIGHT BEARING RESTRICTIONS: No  PAIN:  Are  you having pain? Yes: NPRS scale: 1/10 Pain location: L big toe Pain description: Had surgery on it for ingrown toenail Aggravating factors: Excessive movement Relieving factors: rest, medication, ice  FALLS: Has patient fallen in last 6 months? Yes. Number of falls 1, treadmill  LIVING ENVIRONMENT: Lives with: lives with their spouse Lives in: House/apartment Stairs: stairs inside/outside, does have chair lift available if needed Has following equipment at home: Wheelchair (manual), shower chair, and Grab bars  PLOF: Independent  PATIENT GOALS: I want to maintain function as long as possible. Several years ago my strength kind of went away.   OBJECTIVE:  Note: Objective measures were completed at Evaluation unless otherwise noted.  HAND DOMINANCE: Right  ADLs: Overall ADLs: Reports independence but some tasks that require FM coordination takes longer Transfers/ambulation related to ADLs: I/Mod I Eating: Independent Grooming: Independent UB Dressing: Difficulty buttoning LB Dressing: Difficulty buttoning pants Toileting: Independent Bathing:  Mod Independent Tub Shower transfers: Mod Independent Equipment: Shower seat with back, Grab bars, and Walk in shower  IADLs: Shopping: Wife usually completes even before diagnosis Light housekeeping: Completing laundry and washes dishes  Meal Prep: wife completes cooking even before PD Community mobility: Driving Medication management: Completes Landscape architect: Independent Handwriting: 100% legible and Mild micrographia  MOBILITY STATUS: Independent and Hx of falls  POSTURE COMMENTS:  No Significant postural limitations  ACTIVITY TOLERANCE: Activity tolerance: Pt reports no changes  FUNCTIONAL OUTCOME MEASURES: Fastening/unfastening 3 buttons: 34.54 seconds Physical performance test: PPT#2 (simulated eating) 17.50 seconds & PPT#4 (donning/doffing jacket): 23.04 seconds    01/19/24   COORDINATION: 9 Hole Peg  test: Right: 40.30 sec; Left: 38.43 sec Box and Blocks:  Right 32 blocks, Left 38 blocks (Did at first bring R hand around standing piece but corrected with verbal cue)  UE ROM:  WFL  UE MMT:   4/5 grossly  SENSATION:  WFL; however reports occasional numbness in thenar eminence of R thumb, reports doctor said it's most likely d/t overuse. Was a engineer, civil (consulting) for work.  MUSCLE TONE: RUE: Within functional limits and LUE: Within functional limits  COGNITION: Overall cognitive status: Within functional limits for tasks assessed  OBSERVATIONS: decreased strength, decreased FM coordination especially in R dominant hand, difficulty with ADLs especially buttoning/zipping up jacket, pt reports micrographia when writing longer sentences as well                                                                                                                    TREATMENT DATE:  01/28/24 Assisted pt  in assembling and organizing memory notebook for carryover with participation in HEPs in all disciplines and for improved ease with locating PD resources PT/OT/SLP have educated him in and provided handouts on. Pt engaged in grip strengthening activities, completing with clamp at level 3 resistance with gold coil grasp of large pegs on pegboard and removing from board, dropping in stationary target. With light resistance Digiflex then with medium resistance Digiflex completed 20 reps of gross grasp on each hand.  01/26/24 GMC: engaged in reaching into moderate height cabinet to remove cups and place onto table top to R with RUE and then L with LUE.  OT providing verbal cues and demonstration for full hand grasp on cup to improve motor control and reduce impact of tremor on motor control.  OT also educating on modifying hand grasp on cup when carrying or drinking from cup for increased motor control.   Coins: engaged in picking up coins one at a time and placing into coin slot with RUE and then LUE.  OT  then increasing challenging to holding 5-10 coins in palm and translating to finger tips one at a time to place into coin slot.   Memory notebook: pt reporting that he purchased a 3 ring binder and has organized his exercises into sections for OT/PT/ST.  OT providing recommendations for strategies to keep up with exercises, creating and printing a checklist to keep up with completion as well as recommendation for A day and B day exercises.    01/21/24: Added onto putty HEP, see Pt instructions and HEP for details.  Pt encouraged to obtain a 3-ring 1 inch binder with sheet protectors and dividers to organize handouts given in PT/OT/SLP. Offered to organize notebook at next tx with this OT or collaborating OT. Pt agreed. Coordination: Engaged in tip to tip pinch inserting and removing pegs with notches from holes in various angles. Finally engaged in block stacking activity honing functional reach and tip to tip pinch.    PATIENT EDUCATION: Education details: handwriting, coordination, large amplitude hands Person educated: Patient Education method: Explanation, Demonstration, and Handouts Education comprehension: verbalized understanding, returned demonstration, and verbal cues required  HOME EXERCISE PROGRAM: 12/31/23 - Seated Shoulder Flexion with Self-Anchored Resistance  - 1 x daily - 4 x weekly - 3 sets - 10 reps - Seated Shoulder Horizontal Abduction with Resistance  - 1 x daily - 4 x weekly - 3 sets - 10 reps - Seated Elbow Flexion with Self-Anchored Resistance  - 1 x daily - 4 x weekly - 3 sets - 10 reps - Seated Elbow Extension with Self-Anchored Resistance  - 1 x daily - 4 x weekly - 3 sets - 10 reps  Access Code: [email or web address] URL: [email or web address] Date: 01/19/2024 Prepared by: [facility] - Outpatient  Rehab - [facility]  Exercises - Seated Gripping Towel  - 1 sets - 10 reps - Putty Squeezes  - 1 sets - 10 reps - Finger Pinch and Pull with Putty  - 1  sets - 10 reps - Wrist Flexion and Extension with Resistance Bar (putty)  - 1 sets - 10 reps  Access Code: [email or web address] URL: [email or web address] Date: 01/21/2024 Prepared by: Rocky Dutch  Exercises - Tip Pinch with Putty  - 1 x daily - 4 x weekly - 3 sets - 10 reps - 3-Point Pinch with Putty  - 1 x daily - 4 x weekly - 3 sets - 10 reps - Key Pinch with Putty  - 1 x daily - 4  x weekly - 1 sets - 10 reps - Finger Lumbricals with Putty  - 1 x daily - 4 x weekly - 3 sets - 10 reps - Removing Marbles from Putty  - 1 x daily - 4 x weekly - 1 sets  GOALS: Goals reviewed with patient? Yes  SHORT TERM GOALS: Target date: 01/21/24  Pt will verbalize understanding of adaptive strategies to increase ease with ADLS/IADLS   Baseline: New to OP OT Goal status: in progress  2.  Pt will demonstrate improved ease with fastening buttons as evidenced by decreasing 3 button/unbutton time by at least 5 seconds  Baseline: 34.54 seconds 01/14/24: 25.66 seconds Goal status: GOAL MET  3.  Pt will demonstrate improved FM coordination and functional use of RUE by increasing Box and Blocks score by at least 3 blocks Baseline: Right 32 blocks, Left 38 blocks 01/14/24: 31 blocks R hand Goal status: in progress  4.  Pt will be independent with HEPs for UE strength and FM coordination with handouts for reference prn  Baseline: New to OP OT  Goal status: in progress  5. Pt will write short paragraph with 75% legibility and mild to no micrographia Baseline:  Wrote short sentence ([place]) with 100% legible and Mild micrographia. Pt reports micrographia worsens the longer he writes. Goal status: in progress  6. Pt will demonstrate an improved average PSFS score of at least 2 points, or individual score improvement of 3 points   Baseline:   01/19/24: 5.0  Goal status: in progress   LONG TERM GOALS: Target date: 02/18/24  Pt will write short paragraph with 100% legibility  and mild to no micrographia Baseline:  Wrote short sentence ([place]) with 100% legible and Mild micrographia. Pt reports micrographia worsens the longer he writes. Goal status: in progress  2.  Pt will demonstrate improved fine motor coordination for ADLs as evidenced by decreasing 9 hole peg test score for R hand by at least 3 secs  Baseline: Right: 40.30 sec; Left: 38.43 sec Goal status: in progress  3.  Pt will demonstrate increased ease with dressing as evidenced by decreasing PPT#4 (don/ doff jacket) to 20 secs or less  Baseline: PPT#4 (donning/doffing jacket): 23.04 seconds 01/14/24: 12.18 seconds  Goal status: GOAL MET   4.  Pt will verbalize understanding of ways to prevent future PD related complications and PD community resources. Baseline: Initiated with [employer] classes Goal status: in progress   5.  Pt will demonstrate improved FM coordination and functional use of RUE by increasing Box and Blocks score by at least 6 blocks Baseline: Right 32 blocks, Left 38 blocks 01/14/24: 31 blocks  Goal status: in progress   ASSESSMENT:  CLINICAL IMPRESSION: Patient is a 73 y.o. male who was seen today for occupational therapy tx for PD management. Pt receptive to education and utilization of various resources, participates well with issued HEPs.  Patient will continue to benefit from skilled therapy services to better manage occupational barriers secondary to Parkinson's and improve independence and safety with ADLs and IADLs.   PERFORMANCE DEFICITS: in functional skills including ADLs, IADLs, coordination, dexterity, strength, Fine motor control, balance, cardiopulmonary status limiting function, and UE functional use, cognitive skills including safety awareness and sequencing, and psychosocial skills including coping strategies and environmental adaptation.   IMPAIRMENTS: are limiting patient from ADLs, IADLs, and leisure.     PLAN:  OT FREQUENCY:  2x/week  OT DURATION: 8 weeks  PLANNED INTERVENTIONS: 97168 OT Re-evaluation, 97535 self care/ADL training, 02889 therapeutic exercise, 97530 therapeutic activity, 97112 neuromuscular re-education, 97140 manual therapy, passive range of motion, functional mobility training, energy conservation, coping strategies training, patient/family education, and DME and/or AE instructions  RECOMMENDED OTHER SERVICES: none noted   CONSULTED AND AGREED WITH PLAN OF CARE: Patient  PLAN FOR NEXT SESSION:  Coordination activities Grip strengthening Continue BIG movements Further AE recommendations prn   Rocky Dutch, OTR/L 01/28/2024, 12:07 PM  [facility] [address] [place], [place] [address] Phone # [phone] Fax # [phone]

## 2024-02-02 ENCOUNTER — Ambulatory Visit: Admitting: Physical Therapy

## 2024-02-02 ENCOUNTER — Ambulatory Visit: Attending: Neurology

## 2024-02-02 ENCOUNTER — Ambulatory Visit: Admitting: Occupational Therapy

## 2024-02-02 ENCOUNTER — Encounter: Payer: Self-pay | Admitting: Physical Therapy

## 2024-02-02 DIAGNOSIS — R2689 Other abnormalities of gait and mobility: Secondary | ICD-10-CM | POA: Diagnosis present

## 2024-02-02 DIAGNOSIS — R471 Dysarthria and anarthria: Secondary | ICD-10-CM | POA: Diagnosis present

## 2024-02-02 DIAGNOSIS — R1312 Dysphagia, oropharyngeal phase: Secondary | ICD-10-CM | POA: Diagnosis present

## 2024-02-02 DIAGNOSIS — M6281 Muscle weakness (generalized): Secondary | ICD-10-CM | POA: Diagnosis present

## 2024-02-02 DIAGNOSIS — G20A1 Parkinson's disease without dyskinesia, without mention of fluctuations: Secondary | ICD-10-CM | POA: Insufficient documentation

## 2024-02-02 DIAGNOSIS — R29818 Other symptoms and signs involving the nervous system: Secondary | ICD-10-CM | POA: Insufficient documentation

## 2024-02-02 DIAGNOSIS — R278 Other lack of coordination: Secondary | ICD-10-CM | POA: Insufficient documentation

## 2024-02-02 DIAGNOSIS — R29898 Other symptoms and signs involving the musculoskeletal system: Secondary | ICD-10-CM | POA: Insufficient documentation

## 2024-02-02 DIAGNOSIS — R2681 Unsteadiness on feet: Secondary | ICD-10-CM

## 2024-02-02 NOTE — Therapy (Signed)
 OUTPATIENT OCCUPATIONAL THERAPY PARKINSON'S TREATMENT  Patient Name: Tyler [name] MRN: [id number] DOB:10/03/1950, 73 y.o., male Today's Date: 02/02/2024  PCP: Janey Santos REFERRING PROVIDER: Evonnie Asberry RAMAN, DO  END OF SESSION:   OT End of Session - 02/02/24 1248     Visit Number 11    Number of Visits 17    Date for Recertification  02/19/24    Authorization Type [employer]-VL:[place]    OT Start Time 1102    OT Stop Time 1144    OT Time Calculation (min) 42 min    Equipment Utilized During Treatment clamp and pegs    Activity Tolerance Patient tolerated treatment well    Behavior During Therapy WFL for tasks assessed/performed                Past Medical History:  Diagnosis Date   Adenomatous polyp of colon 02/2004   Anemia    Anxiety    Arthritis    BPH (benign prostatic hyperplasia)    Colonic varices    Degenerative joint disease    Depression    Depression    Fatty liver    GERD (gastroesophageal reflux disease)    Heart murmur    Hemorrhoids    Hyperlipidemia    Hypertension    IBS (irritable bowel syndrome)    Prostate cancer (HCC) 2011   S/P minimally invasive mitral valve repair 04/11/2020   Complex valvuloplasty including artificial Gore-tex neochord placement x8 with suture plication of posterior commissure and 30 mm Sorin Memo 3D ring annuloplasty via right mini thoracotomy approach   Thrombocytopenia    Thyroid  nodule    Past Surgical History:  Procedure Laterality Date   BUBBLE STUDY  12/22/2019   Procedure: BUBBLE STUDY;  Surgeon: Barbaraann Darryle Ned, MD;  Location: [facility] ENDOSCOPY;  Service: Cardiovascular;;   CARDIAC CATHETERIZATION     COLONOSCOPY     MASS EXCISION Right 05/14/2021   Procedure: REPAIR RIGHT CHEST WALL HERNIA WITH MESH;  Surgeon: Kerrin Elspeth BROCKS, MD;  Location: [facility] OR;  Service: Thoracic;  Laterality: Right;   MITRAL VALVE REPAIR Right 04/11/2020   Procedure: MINIMALLY INVASIVE MITRAL VALVE REPAIR (MVR)  USING MEMO 4D RING SIZE ;  Surgeon: Dusty Sudie DEL, MD;  Location: [facility] OR;  Service: Open Heart Surgery;  Laterality: Right;   PROSTATECTOMY     RIGHT/LEFT HEART CATH AND CORONARY ANGIOGRAPHY N/A 12/22/2019   Procedure: RIGHT/LEFT HEART CATH AND CORONARY ANGIOGRAPHY;  Surgeon: Wonda Sharper, MD;  Location: [facility] INVASIVE CV LAB;  Service: Cardiovascular;  Laterality: N/A;   TEE WITHOUT CARDIOVERSION N/A 12/22/2019   Procedure: TRANSESOPHAGEAL ECHOCARDIOGRAM (TEE);  Surgeon: Barbaraann Darryle Ned, MD;  Location: [facility] ENDOSCOPY;  Service: Cardiovascular;  Laterality: N/A;   TEE WITHOUT CARDIOVERSION N/A 04/11/2020   Procedure: TRANSESOPHAGEAL ECHOCARDIOGRAM (TEE);  Surgeon: Dusty Sudie DEL, MD;  Location: [facility] OR;  Service: Open Heart Surgery;  Laterality: N/A;   TONSILLECTOMY     TOOTH EXTRACTION  03/07/2020   TYMPANOPLASTY  01/2016   Patient Active Problem List   Diagnosis Date Noted   Incisional hernia 05/14/2021   S/P minimally invasive mitral valve repair 04/11/2020   Mitral regurgitation 12/20/2019   VARICES OF OTHER SITES 12/30/2007   History of colonic polyps 12/25/2007    ONSET DATE: 12/09/2023 referral date; diagnosed with PD in 10/2022  REFERRING DIAG:  Diagnosis  G20.A1 (ICD-10-CM) - Parkinson's disease without dyskinesia or fluctuating manifestations (HCC)    THERAPY DIAG:  Other lack of coordination  Other abnormalities  of gait and mobility  Rationale for Evaluation and Treatment: Rehabilitation  SUBJECTIVE:   SUBJECTIVE STATEMENT: Pt reports continued participation with exercises, stated that he feels the putty exercises are especially helpful.  Pt accompanied by: self  PERTINENT HISTORY: hx of mitral valve replacement, prostate cancer  PRECAUTIONS: Other: mitral valve repair with ring, fall, cancer hx, HTN  WEIGHT BEARING RESTRICTIONS: No  PAIN:  Are you having pain? Yes: NPRS scale: 1/10 Pain location: L big toe Pain description: Had surgery on it for  ingrown toenail Aggravating factors: Excessive movement Relieving factors: rest, medication, ice  FALLS: Has patient fallen in last 6 months? Yes. Number of falls 1, treadmill  LIVING ENVIRONMENT: Lives with: lives with their spouse Lives in: House/apartment Stairs: stairs inside/outside, does have chair lift available if needed Has following equipment at home: Wheelchair (manual), shower chair, and Grab bars  PLOF: Independent  PATIENT GOALS: I want to maintain function as long as possible. Several years ago my strength kind of went away.   OBJECTIVE:  Note: Objective measures were completed at Evaluation unless otherwise noted.  HAND DOMINANCE: Right  ADLs: Overall ADLs: Reports independence but some tasks that require FM coordination takes longer Transfers/ambulation related to ADLs: I/Mod I Eating: Independent Grooming: Independent UB Dressing: Difficulty buttoning LB Dressing: Difficulty buttoning pants Toileting: Independent Bathing:  Mod Independent Tub Shower transfers: Mod Independent Equipment: Shower seat with back, Grab bars, and Walk in shower  IADLs: Shopping: Wife usually completes even before diagnosis Light housekeeping: Completing laundry and washes dishes  Meal Prep: wife completes cooking even before PD Community mobility: Driving Medication management: Completes Landscape architect: Independent Handwriting: 100% legible and Mild micrographia  MOBILITY STATUS: Independent and Hx of falls  POSTURE COMMENTS:  No Significant postural limitations  ACTIVITY TOLERANCE: Activity tolerance: Pt reports no changes  FUNCTIONAL OUTCOME MEASURES: Fastening/unfastening 3 buttons: 34.54 seconds Physical performance test: PPT#2 (simulated eating) 17.50 seconds & PPT#4 (donning/doffing jacket): 23.04 seconds    01/19/24   COORDINATION: 9 Hole Peg test: Right: 40.30 sec; Left: 38.43 sec Box and Blocks:  Right 32 blocks, Left 38 blocks (Did at first  bring R hand around standing piece but corrected with verbal cue)  UE ROM:  WFL  UE MMT:   4/5 grossly  SENSATION:  WFL; however reports occasional numbness in thenar eminence of R thumb, reports doctor said it's most likely d/t overuse. Was a engineer, civil (consulting) for work.  MUSCLE TONE: RUE: Within functional limits and LUE: Within functional limits  COGNITION: Overall cognitive status: Within functional limits for tasks assessed  OBSERVATIONS: decreased strength, decreased FM coordination especially in R dominant hand, difficulty with ADLs especially buttoning/zipping up jacket, pt reports micrographia when writing longer sentences as well                                                                                                                    TREATMENT DATE:  02/02/24 Large amplitude (PWR Hands): Engaged in PWR!Hands exercises with min cues for BIG  movements. OT providing demonstration with verbal cues for amplitude. OT educating on importance of completion daily and/or prior to completing [facility] tasks. Coordination: pt demonstrating improvements in motor control and timing with focus on amplitude. Box and blocks: 39 blocks 9 hole peg test: 36.53 sec Review community support group and exercise classes for PD.  Pt reporting that is aware of some community classes but has not yet attended or further pursued any.  OT providing pt with information about Silver Sneakers (unsure if his insurance is eligible) and handouts for PD exercises at the [facility].  Pt reporting understanding of engagement in community resources for both exercise and support.   01/28/24 Assisted pt in assembling and organizing memory notebook for carryover with participation in HEPs in all disciplines and for improved ease with locating PD resources PT/OT/SLP have educated him in and provided handouts on. Pt engaged in grip strengthening activities, completing with clamp at level 3 resistance with gold coil grasp of  large pegs on pegboard and removing from board, dropping in stationary target. With light resistance Digiflex then with medium resistance Digiflex completed 20 reps of gross grasp on each hand.  01/26/24 GMC: engaged in reaching into moderate height cabinet to remove cups and place onto table top to R with RUE and then L with LUE.  OT providing verbal cues and demonstration for full hand grasp on cup to improve motor control and reduce impact of tremor on motor control.  OT also educating on modifying hand grasp on cup when carrying or drinking from cup for increased motor control.   Coins: engaged in picking up coins one at a time and placing into coin slot with RUE and then LUE.  OT then increasing challenging to holding 5-10 coins in palm and translating to finger tips one at a time to place into coin slot.   Memory notebook: pt reporting that he purchased a 3 ring binder and has organized his exercises into sections for OT/PT/ST.  OT providing recommendations for strategies to keep up with exercises, creating and printing a checklist to keep up with completion as well as recommendation for A day and B day exercises.   PATIENT EDUCATION: Education details: coordination, large amplitude hands, PD resources Person educated: Patient Education method: Explanation, Demonstration, and Handouts Education comprehension: verbalized understanding, returned demonstration, and verbal cues required  HOME EXERCISE PROGRAM: 12/31/23 - Seated Shoulder Flexion with Self-Anchored Resistance  - 1 x daily - 4 x weekly - 3 sets - 10 reps - Seated Shoulder Horizontal Abduction with Resistance  - 1 x daily - 4 x weekly - 3 sets - 10 reps - Seated Elbow Flexion with Self-Anchored Resistance  - 1 x daily - 4 x weekly - 3 sets - 10 reps - Seated Elbow Extension with Self-Anchored Resistance  - 1 x daily - 4 x weekly - 3 sets - 10 reps  Access Code: [email or web address] URL: [email or web address] Date:  01/19/2024 Prepared by: [facility] Outpatient  Re[facility] - [facility]  Exercises - Seated Gripping Towel  - 1 sets - 10 reps - Putty Squeezes  - 1 sets - 10 reps - Finger Pinch and Pull with Putty  - 1 sets - 10 reps - Wrist Flexion and Extension with Resistance Bar (putty)  - 1 sets - 10 reps  Access Code: [email or web address] URL: [email or web address] Date: 01/21/2024 Prepared by: Rocky Dutch  Exercises - Tip Pinch with Putty  - 1 x daily - 4 x weekly - 3 sets - 10  reps - 3-Point Pinch with Putty  - 1 x daily - 4 x weekly - 3 sets - 10 reps - Key Pinch with Putty  - 1 x daily - 4 x weekly - 1 sets - 10 reps - Finger Lumbricals with Putty  - 1 x daily - 4 x weekly - 3 sets - 10 reps - Removing Marbles from Putty  - 1 x daily - 4 x weekly - 1 sets  GOALS: Goals reviewed with patient? Yes  SHORT TERM GOALS: Target date: 01/21/24  Pt will verbalize understanding of adaptive strategies to increase ease with ADLS/IADLS   Baseline: New to OP OT Goal status: in progress  2.  Pt will demonstrate improved ease with fastening buttons as evidenced by decreasing 3 button/unbutton time by at least 5 seconds  Baseline: 34.54 seconds 01/14/24: 25.66 seconds Goal status: GOAL MET  3.  Pt will demonstrate improved FM coordination and functional use of RUE by increasing Box and Blocks score by at least 3 blocks Baseline: Right 32 blocks, Left 38 blocks 01/14/24: 31 blocks R hand Goal status: in progress  4.  Pt will be independent with HEPs for UE strength and FM coordination with handouts for reference prn  Baseline: New to OP OT  Goal status: in progress  5. Pt will write short paragraph with 75% legibility and mild to no micrographia Baseline:  Wrote short sentence ([place]) with 100% legible and Mild micrographia. Pt reports micrographia worsens the longer he writes. Goal status: in progress  6. Pt will demonstrate an improved average PSFS score of at  least 2 points, or individual score improvement of 3 points   Baseline:   01/19/24: 5.0  Goal status: in progress   LONG TERM GOALS: Target date: 02/18/24  Pt will write short paragraph with 100% legibility and mild to no micrographia Baseline:  Wrote short sentence ([place]) with 100% legible and Mild micrographia. Pt reports micrographia worsens the longer he writes. Goal status: in progress  2.  Pt will demonstrate improved fine motor coordination for ADLs as evidenced by decreasing 9 hole peg test score for R hand by at least 3 secs  Baseline: Right: 40.30 sec; Left: 38.43 sec 02/02/24: Right: 36.53 sec Goal status: MET  3.  Pt will demonstrate increased ease with dressing as evidenced by decreasing PPT#4 (don/ doff jacket) to 20 secs or less  Baseline: PPT#4 (donning/doffing jacket): 23.04 seconds 01/14/24: 12.18 seconds  Goal status: GOAL MET   4.  Pt will verbalize understanding of ways to prevent future PD related complications and PD community resources. Baseline: Initiated with PWR!Moves classes Goal status: in progress   5.  Pt will demonstrate improved FM coordination and functional use of RUE by increasing Box and Blocks score by at least 6 blocks Baseline: Right 32 blocks, Left 38 blocks 01/14/24: 31 blocks  02/02/24: 39 blocks Goal status: MET   ASSESSMENT:  CLINICAL IMPRESSION: Patient is a 73 y.o. male who was seen today for occupational therapy tx for PD management. Pt receptive to education on engagement in PWR! Moves daily and following up in regards to PD community classes.  Pt demonstrating improvements in both Box and Blocks and 9 hole peg test assessments this session.  Patient will continue to benefit from skilled therapy services to better manage occupational barriers secondary to Parkinson's and improve independence and safety with ADLs and IADLs.   PERFORMANCE DEFICITS: in functional skills including  ADLs, IADLs, coordination,  dexterity, strength, Fine motor control, balance, cardiopulmonary status limiting function, and UE functional use, cognitive skills including safety awareness and sequencing, and psychosocial skills including coping strategies and environmental adaptation.   IMPAIRMENTS: are limiting patient from ADLs, IADLs, and leisure.     PLAN:  OT FREQUENCY: 2x/week  OT DURATION: 8 weeks  PLANNED INTERVENTIONS: 97168 OT Re-evaluation, 97535 self care/ADL training, 02889 therapeutic exercise, 97530 therapeutic activity, 97112 neuromuscular re-education, 97140 manual therapy, passive range of motion, functional mobility training, energy conservation, coping strategies training, patient/family education, and DME and/or AE instructions  RECOMMENDED OTHER SERVICES: none noted   CONSULTED AND AGREED WITH PLAN OF CARE: Patient  PLAN FOR NEXT SESSION:  Coordination activities Grip strengthening Continue BIG movements Further AE recommendations prn   KAYLENE DOMINO, OTR/L 02/02/2024, 12:49 PM  [facility] at [facility] [address] [place], [place] [address] Phone # [phone] Fax # [phone]

## 2024-02-02 NOTE — Telephone Encounter (Signed)
Please schedule a follow up     Thanks

## 2024-02-02 NOTE — Therapy (Signed)
 OUTPATIENT SPEECH LANGUAGE PATHOLOGY [facility]/ RECERTIFICATION   Patient Name: Tyler [name] MRN: [id number] DOB:08-28-1950, 73 y.o., male Today's Date: 02/02/2024  PCP: Janey Santos, MD REFERRING PROVIDER: Evonnie Stabs, DO  END OF SESSION:  End of Session - 02/02/24 0940     Visit Number 11    Number of Visits 17    Date for Recertification  02/27/24    SLP Start Time 0936    SLP Stop Time  1016    SLP Time Calculation (min) 40 min    Activity Tolerance Patient tolerated treatment well              Past Medical History:  Diagnosis Date   Adenomatous polyp of colon 02/2004   Anemia    Anxiety    Arthritis    BPH (benign prostatic hyperplasia)    Colonic varices    Degenerative joint disease    Depression    Depression    Fatty liver    GERD (gastroesophageal reflux disease)    Heart murmur    Hemorrhoids    Hyperlipidemia    Hypertension    IBS (irritable bowel syndrome)    Prostate cancer (HCC) 2011   S/P minimally invasive mitral valve repair 04/11/2020   Complex valvuloplasty including artificial Gore-tex neochord placement x8 with suture plication of posterior commissure and 30 mm Sorin Memo 3D ring annuloplasty via right mini thoracotomy approach   Thrombocytopenia    Thyroid  nodule    Past Surgical History:  Procedure Laterality Date   BUBBLE STUDY  12/22/2019   Procedure: BUBBLE STUDY;  Surgeon: Barbaraann Darryle Ned, MD;  Location: [facility] ENDOSCOPY;  Service: Cardiovascular;;   CARDIAC CATHETERIZATION     COLONOSCOPY     MASS EXCISION Right 05/14/2021   Procedure: REPAIR RIGHT CHEST WALL HERNIA WITH MESH;  Surgeon: Kerrin Elspeth BROCKS, MD;  Location: [facility] OR;  Service: Thoracic;  Laterality: Right;   MITRAL VALVE REPAIR Right 04/11/2020   Procedure: MINIMALLY INVASIVE MITRAL VALVE REPAIR (MVR) USING MEMO 4D RING SIZE ;  Surgeon: Dusty Sudie DEL, MD;  Location: [facility] OR;  Service: Open Heart Surgery;  Laterality: Right;   PROSTATECTOMY      RIGHT/LEFT HEART CATH AND CORONARY ANGIOGRAPHY N/A 12/22/2019   Procedure: RIGHT/LEFT HEART CATH AND CORONARY ANGIOGRAPHY;  Surgeon: Wonda Sharper, MD;  Location: [facility] INVASIVE CV LAB;  Service: Cardiovascular;  Laterality: N/A;   TEE WITHOUT CARDIOVERSION N/A 12/22/2019   Procedure: TRANSESOPHAGEAL ECHOCARDIOGRAM (TEE);  Surgeon: Barbaraann Darryle Ned, MD;  Location: [facility] ENDOSCOPY;  Service: Cardiovascular;  Laterality: N/A;   TEE WITHOUT CARDIOVERSION N/A 04/11/2020   Procedure: TRANSESOPHAGEAL ECHOCARDIOGRAM (TEE);  Surgeon: Dusty Sudie DEL, MD;  Location: [facility] OR;  Service: Open Heart Surgery;  Laterality: N/A;   TONSILLECTOMY     TOOTH EXTRACTION  03/07/2020   TYMPANOPLASTY  01/2016   Patient Active Problem List   Diagnosis Date Noted   Incisional hernia 05/14/2021   S/P minimally invasive mitral valve repair 04/11/2020   Mitral regurgitation 12/20/2019   VARICES OF OTHER SITES 12/30/2007   History of colonic polyps 12/25/2007   Speech Therapy Progress Note  Dates of Reporting Period: 12/09/23 to present  Subjective Statement: Pt has been seen for 11 ST visits targeting swallowing, and speech volume.   Objective: Please see MBS report from 12/22/23 (SLP_[place]). To date, both have improved. Pt is making strides to perform 30 reps/day of swallowing exercises. He has been talking with SLP with WNL volume for 10-15 minutes.  Goal  Update: See below  Plan: See pt for 4-6 more sessions  Reason Skilled Services are Required: Pt has not yet maxed rehab potential.    ONSET DATE: diagnosed one year ago; script 11/06/23  REFERRING DIAG: Hypophonia  THERAPY DIAG:  Dysarthria and anarthria - Plan: SLP plan of care cert/re-cert  Dysphagia, oropharyngeal phase - Plan: SLP plan of care cert/re-cert  Rationale for Evaluation and Treatment: Rehabilitation  SUBJECTIVE:   SUBJECTIVE STATEMENT: Pt repots completing HEPs as directed.  Pt accompanied by: self  PERTINENT HISTORY: Pt  dx'd with PD August 2024 with sx prior to that.  PAIN:  Are you having pain? No  FALLS: Has patient fallen in last 6 months?  See PT evaluation for details   PATIENT GOALS: Improve speech and swallowing  OBJECTIVE:  Note: Objective measures were completed at Evaluation unless otherwise noted.  MBS 12/22/23: Pt demonstrates a mild oropharyngeal dysphagia consisting of good airway protection, but poor oral efficiency and mild pharyngeal residue. Pt recommended to continue regular diet and thin liquids with f/u with OP SLP for appropriate strategies.    Pt has inefficient oral phase with unnecessary lingual movement to gather bolus and initiate swallow as well a piecemeal transit and loss of bolus to floor of mouth and buccal cavity. When cued to do so pt can increase efficency and improve bolus cohesion to decrease oral residue and reduce number of swallows gather it and swallow it all at once. Pt also has mild pharyngeal residue with thin liquids, likely due to oral deficits and slightly decreased ROM and duration of swallow. Again with cues for an effortful swallow pt increases these measures and reduces residue. Pt did not aspirate during the study, but expect some penetration or aspiration of oral residue and pharyngeal residue pooling after bites and sips. Also noted bumpy posterior pharyngeal wall and esophageal column against cervical spine, suggesting loss of tissue mass with age/sarcopenia. Pt to f/u with OP SLP to implement strategies. Would not suggest any texture changes.   DIGEST Swallow Severity Rating*             Safety: 0             Efficiency:1             Overall Pharyngeal Swallow Severity: 1 1: mild; 2: moderate; 3: severe; 4: profound  *The Dynamic Imaging Grade of Swallowing Toxicity is standardized for the head and neck cancer population, however, demonstrates promising clinical applications across populations to standardize the clinical rating of pharyngeal swallow  safety and severity.   Factors that may increase risk of adverse event in presence of aspiration Noe & Lianne 2021):     Swallow Evaluation Recommendations Recommendations: PO diet PO Diet Recommendation: Regular;Thin liquids (Level 0) Liquid Administration via: Cup;Straw Medication Administration: Whole meds with liquid Supervision: Patient able to self-feed Swallowing strategies  : effortful swallow;Avoid mixed consistencies Postural changes: Position pt fully upright for meals;Stay upright 30-60 min after meals Oral care recommendations: Oral care BID (2x/day)  PATIENT REPORTED OUTCOME MEASURES (PROM): Communication Participation Item Bank: returned 01/12/24 and pt scored 16/30 with lower scores indicating more barriers to communication due to pt's softer speech. All answers were 1-2 (with options from 0-3).  TREATMENT DATE:   02/02/24: SPEECH: Pt spoke with SLP for 23 minutes with self correction for reduced speech volume once. Average volume was WNL.  SWALLOWING: SLP assessed pt's accuracy with his swallowing HEP. Pt was independent with these and demonstrated fatigue only with Claudette after 8 reps, Masako with 8 reps. Fatigue demonstrated with effortful after 8 reps; pt req'd less water for eliciting swallows than last week. Jeb is completing 20 reps/day so SLP encouraged him to attempt 25/day this week in hopes of ultimately making it to 30/day.   01/28/24: SPEECH: SLP guided pt through practice with deliberate and mindful speech at a WNL volume. Practice in 12 minutes of conversation resulted in average 70dB until 10 minutes, when average decr'd to 68dB. Pt was unsure of the decr in volume until SLP incr'd pt's awareness. In 5 minutes conversation after this, pt maintained WNL volume. SLP told pt to pay attention to people's reactions (or not) to his WNL  conversational volume. SLP reiterated that pt is beginning to feel how WNL volume speech production feels. SWALLOWING: SLP guided pt through his swallow HEP and pt with noted fatigue after rep # 6 or 7 on each exercise. SLP told pt to completed at least 5 reps at a time or reps will be insufficient for progress. Pt procedure was independent.   01/26/24: ROYLENEBETHA Marty completed HEP with rare min A for hold time for [facility]. He identified hold times that were too short x2. He is completing all exercises but maybe a little less of [place]el[place]hn than others, he stated. SLP encouraged pt to follow suggestions for all exercises. SPEECH: Pt entered with WNL speech volume for 5 minutes conversation. He read his HEP with WNL volume. SLP engaged pt with improving speech loudness by having him provided descriptions that would necessitate 4-5 minute descriptions (bedroom, front yard, etc). Lio maintained WNL volume 85-90% of the time. Pt will make concerted effort to practice louder speech more consistently and then use when talking to his wife in the mornings.   01/21/24: SPEECH: SLP guided pt through his HEP for louder more articulate speech. Pt's loud /a/ averaged 86dB, and his sentences were 78dB prior to SLP usual mod-max cues for loudness; then they increased to 82dB. Pt was told to cont with his HEP, and SLP added sentence level responses to cont to make WNL volume speech a habit. In phrase responses pt averaged 69dB which improved to 70dB with initial mod cues faded to rare min cues for loudness. SLP varied cognitive load from minimal to mod-complex. Homework to practice at sentence level 2 15-minute periods or one 20-minute period/day.   01/19/24: SWALLOW: SLP guided pt through his swallowing HEP. Benefited from visual cues of a mirror for [place]; pt agreed. Masako req'd min A faded to independent. Initial min cues for effortful and then independent. With POs, pt req'd cues for not taking a sip with a  solid bolus  but just enough liquid to wet the solid bolus (no extra water in the mouth), min A faded to independent with precautions.  SPEECH: SLP guided pt through his HEP for louder more articulate speech. Pt's loud /a/ averaged 85dB, and his sentences were 81dB prior to SLP mod cues for loudness; then they increased to 83dB. Pt was told to cont with his HEP.   01/14/24: Pt needs swallow HEP done next session.  SPEECH: SLP asked Dillen to cont to look at his schedule and let SLP know how this strategy is going next  visit about keeping better organized. Pt's average with loud /a/ was 87dB, and pt wrote 4 more everyday sentences; Average today with these was 79dB. In two conversation segments, one of 8 minutes, one of 9 minutes, and one of 12 minutes pt maintained WNL volume. He noted that being in the ST room, with the sound level meter made it easier to concentrate on loudness and stated he needed to do better at home with increasing his loudness.   11/10/25BETHA Leisure told SLP that he feels like he is not as organized with his days as he was previously and wonders if PD has lessened his skills with executive function. SLP suggested he sit down with his medical appointments and to-do list and spend ~5 minutes thinking through his next day. He thanked SLP for this suggestion. With loud /a/ pt's average was 87dB, and SLP assisted pt in developing everyday sentences. He was told to complete 4-5 more sentences until next session.  Average today with these was 77dB and SLP had pt incr loudness and he did, to average 79dB.   01/07/24: CPIB was not returned today. Will bring next time.  SWALLOW: Pt states the HEP for swallowing is difficult. In light of this SLP reviewed pt's HEP with him today. Pt stated his confidence in performing the HEP was better, from 8-9/10 (where 10=100% confident about procedure). SPEECH: SLP modeled loud /a/ for pt and provided rationale for loud /a/, and he generated loud /a/ average  86dB. Pt has not been successful with routine completion with these. SLP provided some examples of how to accomplish that. SLP reviewed the rationale for reading out loud. With reading sentences, pt's average was 74dB with initial min A initially. SLP progressed into paragraphs and pt loudness was 71dB with initial cues for intent, deliberate, purposeful speech. For homework, SLP told pt to read out loud for 30 seconds x10 after loud /a/.   01/05/24: CPIB provided to pt today. Discussed/reviewed MBS with pt today. Stressed gather and swallow concept for each swallow was much like swallowing with more intent/purpose like SLP and pt had discussed about his speech in previous session. SLP assisted pt with practice with gather and swallow concept with swallows of water. Pt with more effort with swallow on the swallows he thought gather and swallow was more salient for him. SLP then developed HEP for pt and SLP assessed pt's procedure with these exercises and corrected PRN. Pt to complete at least 5 days/week with 30 reps/day.   12/22/23: NEEDS CPIB NEXT SESSION. Explained the nature of fluoroscopy with pt, and that this machine will record his swallowing with food textures and liquids. Explained about dB being a logarithmic scale. SLP shaped pt's loud /a/ to average 82dB, started at 73dB. Pt read sentences and req'd SLP usual mod-max cues to use intent and purpose with effortful voice. Digital recording provided beneficial auditory feedback of pt's productions. Homework to read out loud 30 seconds x10, and perform 10 loud /a/, BID.  12/09/23: n/a  PATIENT EDUCATION: Education details: See treatment date Person educated: Patient Education method: Explanation, Demonstration, Verbal cues, and Handouts Education comprehension: verbalized understanding, returned demonstration, verbal cues required, and needs further education  HOME EXERCISE PROGRAM: HEP for louder speech, HEP for  dysphagia   GOALS: Goals reviewed with patient? Yes, generally  SHORT TERM GOALS: Target date:  01/26/24 (due to visit count)  Pt will undergo MBS if clinically indicated Baseline: Goal status: met  2.  Pt will demo 18/20 sentences  with WNL volume Baseline:  Goal status: met  3.  Pt will demo WNL volume in 5 minutes conversation Baseline:  Goal status: met  4.  Pt will complete dysphagia HEP (if indicated following MBS) with rare min A Baseline:  Goal status: met   LONG TERM GOALS: Target date:  03/05/24  Pt will have improved PROM Baseline:  Goal status: INITIAL  2.  Pt will demo WNL volume in 10 minutes conversation, in 3 sessions Baseline: 01/28/24 Goal status: modified  3.  Pt will complete dysphagia HEP (if indicated following MBS) with mod I in 3 sessions Baseline:  Goal status: INITIAL  4.  Pt will demo knowledge about where to find reputable info about speech/language info with Parkinson's on the internet Baseline:  Goal status: INITIAL   ASSESSMENT:  CLINICAL IMPRESSION: RECERT TODAY. Patient is a 73 y.o. M who was seen today for treatment of swallowing and/or speech and language in light of PD. See treatment date above for today's date for further details on today's session. He is showing progress on all LtGs. On date of eval he demonstrated incr'd volume and intent with short conversation today proving to be a good candidate for skilled ST. Wife states pt's volume is such that she has to ask him to repeat more often than she had to 5-6 years ago.    OBJECTIVE IMPAIRMENTS: Objective impairments include dysarthria and dysphagia. These impairments are limiting patient from household responsibilities, ADLs/IADLs, effectively communicating at home and in community, and safety when swallowing.Factors affecting potential to achieve goals and functional outcome are none noted today. Patient will benefit from skilled SLP services to address above impairments and  improve overall function.  REHAB POTENTIAL: Good  PLAN:  SLP FREQUENCY: 2x/week  SLP DURATION: 4 weeks, or 17 total sessions  PLANNED INTERVENTIONS: Aspiration precaution training, Pharyngeal strengthening exercises, Diet toleration management , Cueing hierachy, Internal/external aids, Oral motor exercises, Functional tasks, SLP instruction and feedback, Compensatory strategies, Patient/family education, [phone] Treatment of speech (30 or 45 min) , and [id number] Treatment of swallowing function    Aureliano Oshields, CCC-SLP 02/02/2024, 10:45 AM

## 2024-02-03 ENCOUNTER — Ambulatory Visit: Admitting: Podiatry

## 2024-02-03 ENCOUNTER — Encounter: Payer: Self-pay | Admitting: Podiatry

## 2024-02-03 DIAGNOSIS — L600 Ingrowing nail: Secondary | ICD-10-CM

## 2024-02-03 NOTE — Telephone Encounter (Signed)
 Patient seen.

## 2024-02-03 NOTE — Patient Instructions (Signed)
 Continue soaking in epsom salts twice a day followed by antibiotic ointment and a band-aid. Can leave uncovered at night. Continue this until completely healed.  If the area has not healed in 2 weeks, call the office for follow-up appointment, or sooner if any problems arise.  Monitor for any signs/symptoms of infection. Call the office immediately if any occur or go directly to the emergency room. Call with any questions/concerns.

## 2024-02-04 ENCOUNTER — Ambulatory Visit

## 2024-02-04 NOTE — Progress Notes (Signed)
 Subjective: Chief Complaint  Patient presents with   Ingrown Toenail    Rm13 Nail check left big toe/ pt says he has some tenderness with pressure.   73 year old male presents the office the above concerns.  Since he is an ingrown toenail still having some soreness to the lateral side of the toenail at the tip of the toe.  Denies any drainage or pus.  Objective: AAO x3, NAD DP/PT pulses palpable bilaterally, CRT less than 3 seconds Status post partial nail avulsion.  Does not appear there is any reoccurrence of the ingrown toenail proximally but the distal lateral aspect that looks that there is small mount of skin overlying the nail border.  Tenderness is localized to this area there is no drainage or pus.  No fluctuation or crepitation. No pain with calf compression, swelling, warmth, erythema     Assessment: Ingrown toenail  Plan: -All treatment options discussed with the patient including all alternatives, risks, complications.  -We discussed different treatment options including removal of further toenail or debridement.  He wants to proceed with debridement at the corner.  I sharply debrided the symptomatic border without any complications or bleeding today.  Continue Epsom salt soaks.  Symptoms persist likely need to have a repeat partial nail avulsion on the corner of the nail. -Patient encouraged to call the office with any questions, concerns, change in symptoms.   Donnice JONELLE Fees DPM

## 2024-02-07 ENCOUNTER — Encounter: Payer: Self-pay | Admitting: Cardiovascular Disease

## 2024-02-09 ENCOUNTER — Ambulatory Visit

## 2024-02-09 ENCOUNTER — Ambulatory Visit: Admitting: Occupational Therapy

## 2024-02-09 DIAGNOSIS — R471 Dysarthria and anarthria: Secondary | ICD-10-CM | POA: Diagnosis not present

## 2024-02-09 DIAGNOSIS — M6281 Muscle weakness (generalized): Secondary | ICD-10-CM

## 2024-02-09 DIAGNOSIS — R2689 Other abnormalities of gait and mobility: Secondary | ICD-10-CM

## 2024-02-09 DIAGNOSIS — R1312 Dysphagia, oropharyngeal phase: Secondary | ICD-10-CM

## 2024-02-09 DIAGNOSIS — R278 Other lack of coordination: Secondary | ICD-10-CM

## 2024-02-09 MED ORDER — ROSUVASTATIN CALCIUM 5 MG PO TABS: 5.0000 mg | ORAL_TABLET | Freq: Every day | ORAL | 1 refills | Status: AC

## 2024-02-09 MED ORDER — LISINOPRIL 2.5 MG PO TABS: 2.5000 mg | ORAL_TABLET | Freq: Every day | ORAL | 1 refills | Status: AC

## 2024-02-09 NOTE — Patient Instructions (Signed)

## 2024-02-09 NOTE — Therapy (Signed)
 OUTPATIENT OCCUPATIONAL THERAPY PARKINSON'S TREATMENT  Patient Name: Tyler [name] MRN: [id number] DOB:01-Jun-1950, 73 y.o., male Today's Date: 02/09/2024  PCP: Janey Santos REFERRING PROVIDER: Evonnie Asberry RAMAN, DO  END OF SESSION:   OT End of Session - 02/09/24 1022     Visit Number 12    Number of Visits 17    Date for Recertification  02/19/24    Authorization Type [employer]-VL:[place]    OT Start Time 1020    OT Stop Time 1100    OT Time Calculation (min) 40 min    Equipment Utilized During Treatment clamp and pegs    Activity Tolerance Patient tolerated treatment well    Behavior During Therapy WFL for tasks assessed/performed                 Past Medical History:  Diagnosis Date   Adenomatous polyp of colon 02/2004   Anemia    Anxiety    Arthritis    BPH (benign prostatic hyperplasia)    Colonic varices    Degenerative joint disease    Depression    Depression    Fatty liver    GERD (gastroesophageal reflux disease)    Heart murmur    Hemorrhoids    Hyperlipidemia    Hypertension    IBS (irritable bowel syndrome)    Prostate cancer (HCC) 2011   S/P minimally invasive mitral valve repair 04/11/2020   Complex valvuloplasty including artificial Gore-tex neochord placement x8 with suture plication of posterior commissure and 30 mm Sorin Memo 3D ring annuloplasty via right mini thoracotomy approach   Thrombocytopenia    Thyroid  nodule    Past Surgical History:  Procedure Laterality Date   BUBBLE STUDY  12/22/2019   Procedure: BUBBLE STUDY;  Surgeon: Barbaraann Darryle Ned, MD;  Location: [facility] ENDOSCOPY;  Service: Cardiovascular;;   CARDIAC CATHETERIZATION     COLONOSCOPY     MASS EXCISION Right 05/14/2021   Procedure: REPAIR RIGHT CHEST WALL HERNIA WITH MESH;  Surgeon: Kerrin Elspeth BROCKS, MD;  Location: [facility] OR;  Service: Thoracic;  Laterality: Right;   MITRAL VALVE REPAIR Right 04/11/2020   Procedure: MINIMALLY INVASIVE MITRAL VALVE REPAIR (MVR)  USING MEMO 4D RING SIZE ;  Surgeon: Dusty Sudie DEL, MD;  Location: [facility] OR;  Service: Open Heart Surgery;  Laterality: Right;   PROSTATECTOMY     RIGHT/LEFT HEART CATH AND CORONARY ANGIOGRAPHY N/A 12/22/2019   Procedure: RIGHT/LEFT HEART CATH AND CORONARY ANGIOGRAPHY;  Surgeon: Wonda Sharper, MD;  Location: [facility] INVASIVE CV LAB;  Service: Cardiovascular;  Laterality: N/A;   TEE WITHOUT CARDIOVERSION N/A 12/22/2019   Procedure: TRANSESOPHAGEAL ECHOCARDIOGRAM (TEE);  Surgeon: Barbaraann Darryle Ned, MD;  Location: [facility] ENDOSCOPY;  Service: Cardiovascular;  Laterality: N/A;   TEE WITHOUT CARDIOVERSION N/A 04/11/2020   Procedure: TRANSESOPHAGEAL ECHOCARDIOGRAM (TEE);  Surgeon: Dusty Sudie DEL, MD;  Location: [facility] OR;  Service: Open Heart Surgery;  Laterality: N/A;   TONSILLECTOMY     TOOTH EXTRACTION  03/07/2020   TYMPANOPLASTY  01/2016   Patient Active Problem List   Diagnosis Date Noted   Incisional hernia 05/14/2021   S/P minimally invasive mitral valve repair 04/11/2020   Mitral regurgitation 12/20/2019   VARICES OF OTHER SITES 12/30/2007   History of colonic polyps 12/25/2007    ONSET DATE: 12/09/2023 referral date; diagnosed with PD in 10/2022  REFERRING DIAG:  Diagnosis  G20.A1 (ICD-10-CM) - Parkinson's disease without dyskinesia or fluctuating manifestations (HCC)    THERAPY DIAG:  Other lack of coordination  Other  abnormalities of gait and mobility  Muscle weakness (generalized)  Rationale for Evaluation and Treatment: Rehabilitation  SUBJECTIVE:   SUBJECTIVE STATEMENT: Pt reports just getting over a cold from last week.  Pt accompanied by: self  PERTINENT HISTORY: hx of mitral valve replacement, prostate cancer  PRECAUTIONS: Other: mitral valve repair with ring, fall, cancer hx, HTN  WEIGHT BEARING RESTRICTIONS: No  PAIN:  Are you having pain? No  FALLS: Has patient fallen in last 6 months? Yes. Number of falls 1, treadmill  LIVING ENVIRONMENT: Lives with:  lives with their spouse Lives in: House/apartment Stairs: stairs inside/outside, does have chair lift available if needed Has following equipment at home: Wheelchair (manual), shower chair, and Grab bars  PLOF: Independent  PATIENT GOALS: I want to maintain function as long as possible. Several years ago my strength kind of went away.   OBJECTIVE:  Note: Objective measures were completed at Evaluation unless otherwise noted.  HAND DOMINANCE: Right  ADLs: Overall ADLs: Reports independence but some tasks that require FM coordination takes longer Transfers/ambulation related to ADLs: I/Mod I Eating: Independent Grooming: Independent UB Dressing: Difficulty buttoning LB Dressing: Difficulty buttoning pants Toileting: Independent Bathing:  Mod Independent Tub Shower transfers: Mod Independent Equipment: Shower seat with back, Grab bars, and Walk in shower  IADLs: Shopping: Wife usually completes even before diagnosis Light housekeeping: Completing laundry and washes dishes  Meal Prep: wife completes cooking even before PD Community mobility: Driving Medication management: Completes Landscape architect: Independent Handwriting: 100% legible and Mild micrographia  MOBILITY STATUS: Independent and Hx of falls  POSTURE COMMENTS:  No Significant postural limitations  ACTIVITY TOLERANCE: Activity tolerance: Pt reports no changes  FUNCTIONAL OUTCOME MEASURES: Fastening/unfastening 3 buttons: 34.54 seconds Physical performance test: PPT#2 (simulated eating) 17.50 seconds & PPT#4 (donning/doffing jacket): 23.04 seconds    01/19/24   COORDINATION: 9 Hole Peg test: Right: 40.30 sec; Left: 38.43 sec Box and Blocks:  Right 32 blocks, Left 38 blocks (Did at first bring R hand around standing piece but corrected with verbal cue)  UE ROM:  WFL  UE MMT:   4/5 grossly  SENSATION:  WFL; however reports occasional numbness in thenar eminence of R thumb, reports doctor said it's  most likely d/t overuse. Was a engineer, civil (consulting) for work.  MUSCLE TONE: RUE: Within functional limits and LUE: Within functional limits  COGNITION: Overall cognitive status: Within functional limits for tasks assessed  OBSERVATIONS: decreased strength, decreased FM coordination especially in R dominant hand, difficulty with ADLs especially buttoning/zipping up jacket, pt reports micrographia when writing longer sentences as well                                                                                                                    TREATMENT DATE:  02/09/24 Engaged in discussion about community exercise classes and looking into the ones of which he has the information.   Coordination: picking up coins one at a time with RUE and LUE and then placing coins into coin slot.  Increased challenge to in-hand manipulation with picking up coins until holding 8-10 coins in palm and then translating from palm to finger tips to place into coin slot.  Pt requiring min increased time but able to complete with good effort and amplitude. Handwriting: engaged in writing multiple sentence passage about a memorable trip.  Pt utilizing 2 different styles of pens with mild improvements noted with slightly weighted pen with coban wrapping to provide built up handle.  Pt demonstrating good legibility and sizing with handwriting with both pens.  OT reiterating use of print font when writing as an option to increase sizing and legibility as needed.     02/02/24 Large amplitude (PWR Hands): Engaged in PWR!Hands exercises with min cues for BIG movements. OT providing demonstration with verbal cues for amplitude. OT educating on importance of completion daily and/or prior to completing [facility] tasks. Coordination: pt demonstrating improvements in motor control and timing with focus on amplitude. Box and blocks: 39 blocks 9 hole peg test: 36.53 sec Review community support group and exercise classes for PD.  Pt  reporting that is aware of some community classes but has not yet attended or further pursued any.  OT providing pt with information about Silver Sneakers (unsure if his insurance is eligible) and handouts for PD exercises at the [facility].  Pt reporting understanding of engagement in community resources for both exercise and support.   01/28/24 Assisted pt in assembling and organizing memory notebook for carryover with participation in HEPs in all disciplines and for improved ease with locating PD resources PT/OT/SLP have educated him in and provided handouts on. Pt engaged in grip strengthening activities, completing with clamp at level 3 resistance with gold coil grasp of large pegs on pegboard and removing from board, dropping in stationary target. With light resistance Digiflex then with medium resistance Digiflex completed 20 reps of gross grasp on each hand.   PATIENT EDUCATION: Education details: coordination, large amplitude hands, PD resources Person educated: Patient Education method: Explanation, Demonstration, and Handouts Education comprehension: verbalized understanding, returned demonstration, and verbal cues required  HOME EXERCISE PROGRAM: 12/31/23 - Seated Shoulder Flexion with Self-Anchored Resistance  - 1 x daily - 4 x weekly - 3 sets - 10 reps - Seated Shoulder Horizontal Abduction with Resistance  - 1 x daily - 4 x weekly - 3 sets - 10 reps - Seated Elbow Flexion with Self-Anchored Resistance  - 1 x daily - 4 x weekly - 3 sets - 10 reps - Seated Elbow Extension with Self-Anchored Resistance  - 1 x daily - 4 x weekly - 3 sets - 10 reps  Access Code: [email or web address] URL: [email or web address] Date: 01/19/2024 Prepared by: [facility] Outpatient  Rehab - [facility]  Exercises - Seated Gripping Towel  - 1 sets - 10 reps - Putty Squeezes  - 1 sets - 10 reps - Finger Pinch and Pull with Putty  - 1 sets - 10 reps - Wrist Flexion and Extension with Resistance Bar  (putty)  - 1 sets - 10 reps  Access Code: [email or web address] URL: [email or web address] Date: 01/21/2024 Prepared by: Rocky Dutch  Exercises - Tip Pinch with Putty  - 1 x daily - 4 x weekly - 3 sets - 10 reps - 3-Point Pinch with Putty  - 1 x daily - 4 x weekly - 3 sets - 10 reps - Key Pinch with Putty  - 1 x daily - 4 x weekly - 1 sets - 10 reps - Finger Lumbricals with Putty  -  1 x daily - 4 x weekly - 3 sets - 10 reps - Removing Marbles from Putty  - 1 x daily - 4 x weekly - 1 sets  GOALS: Goals reviewed with patient? Yes  SHORT TERM GOALS: Target date: 01/21/24  Pt will verbalize understanding of adaptive strategies to increase ease with ADLS/IADLS   Baseline: New to OP OT Goal status: in progress  2.  Pt will demonstrate improved ease with fastening buttons as evidenced by decreasing 3 button/unbutton time by at least 5 seconds  Baseline: 34.54 seconds 01/14/24: 25.66 seconds Goal status: GOAL MET  3.  Pt will demonstrate improved FM coordination and functional use of RUE by increasing Box and Blocks score by at least 3 blocks Baseline: Right 32 blocks, Left 38 blocks 01/14/24: 31 blocks R hand Goal status: in progress  4.  Pt will be independent with HEPs for UE strength and FM coordination with handouts for reference prn  Baseline: New to OP OT  Goal status: in progress  5. Pt will write short paragraph with 75% legibility and mild to no micrographia Baseline:  Wrote short sentence ([place]) with 100% legible and Mild micrographia. Pt reports micrographia worsens the longer he writes. Goal status: in progress  6. Pt will demonstrate an improved average PSFS score of at least 2 points, or individual score improvement of 3 points   Baseline:   01/19/24: 5.0  Goal status: in progress   LONG TERM GOALS: Target date: 02/18/24  Pt will write short paragraph with 100% legibility and mild to no micrographia Baseline:  Wrote short sentence  ([place]) with 100% legible and Mild micrographia. Pt reports micrographia worsens the longer he writes. Goal status: in progress  2.  Pt will demonstrate improved fine motor coordination for ADLs as evidenced by decreasing 9 hole peg test score for R hand by at least 3 secs  Baseline: Right: 40.30 sec; Left: 38.43 sec 02/02/24: Right: 36.53 sec Goal status: MET  3.  Pt will demonstrate increased ease with dressing as evidenced by decreasing PPT#4 (don/ doff jacket) to 20 secs or less  Baseline: PPT#4 (donning/doffing jacket): 23.04 seconds 01/14/24: 12.18 seconds  Goal status: GOAL MET   4.  Pt will verbalize understanding of ways to prevent future PD related complications and PD community resources. Baseline: Initiated with PWR!Moves classes Goal status: in progress   5.  Pt will demonstrate improved FM coordination and functional use of RUE by increasing Box and Blocks score by at least 6 blocks Baseline: Right 32 blocks, Left 38 blocks 01/14/24: 31 blocks  02/02/24: 39 blocks Goal status: MET   ASSESSMENT:  CLINICAL IMPRESSION: Patient is a 73 y.o. male who was seen today for occupational therapy tx for PD management. Pt demonstrating improved FMC this session as well as demonstrating improvements in legibility with writing of longer passage.  Pt receptive to education of purposeful movements with mobility and coordination tasks.  Patient will continue to benefit from skilled therapy services to better manage occupational barriers secondary to Parkinson's and improve independence and safety with ADLs and IADLs.   PERFORMANCE DEFICITS: in functional skills including ADLs, IADLs, coordination, dexterity, strength, Fine motor control, balance, cardiopulmonary status limiting function, and UE functional use, cognitive skills including safety awareness and sequencing, and psychosocial skills including coping strategies and environmental adaptation.   IMPAIRMENTS: are  limiting patient from ADLs, IADLs, and leisure.     PLAN:  OT FREQUENCY:  2x/week  OT DURATION: 8 weeks  PLANNED INTERVENTIONS: 97168 OT Re-evaluation, 97535 self care/ADL training, 02889 therapeutic exercise, 97530 therapeutic activity, 97112 neuromuscular re-education, 97140 manual therapy, passive range of motion, functional mobility training, energy conservation, coping strategies training, patient/family education, and DME and/or AE instructions  RECOMMENDED OTHER SERVICES: none noted   CONSULTED AND AGREED WITH PLAN OF CARE: Patient  PLAN FOR NEXT SESSION:  Coordination activities Grip strengthening Continue BIG movements Further AE recommendations prn   KAYLENE DOMINO, OTR/L 02/09/2024, 10:22 AM  [facility] at [facility] [address] [place], [place] [address] Phone # [phone] Fax # [phone]

## 2024-02-09 NOTE — Telephone Encounter (Signed)
 Scheduled pt for Follow up appt with Dr Burton via telephone

## 2024-02-09 NOTE — Therapy (Signed)
 OUTPATIENT SPEECH LANGUAGE PATHOLOGY [facility]   Patient Name: Tyler [name] MRN: [id number] DOB:20-Feb-1951, 73 y.o., male Today's Date: 02/09/2024  PCP: Tyler Santos, MD REFERRING PROVIDER: Evonnie Stabs, DO  END OF SESSION:  End of Session - 02/09/24 1115     Visit Number 12    Number of Visits 17    Date for Recertification  02/27/24    SLP Start Time 0934    SLP Stop Time  1015    SLP Time Calculation (min) 41 min    Activity Tolerance Patient tolerated treatment well               Past Medical History:  Diagnosis Date   Adenomatous polyp of colon 02/2004   Anemia    Anxiety    Arthritis    BPH (benign prostatic hyperplasia)    Colonic varices    Degenerative joint disease    Depression    Depression    Fatty liver    GERD (gastroesophageal reflux disease)    Heart murmur    Hemorrhoids    Hyperlipidemia    Hypertension    IBS (irritable bowel syndrome)    Prostate cancer (HCC) 2011   S/P minimally invasive mitral valve repair 04/11/2020   Complex valvuloplasty including artificial Gore-tex neochord placement x8 with suture plication of posterior commissure and 30 mm Sorin Memo 3D ring annuloplasty via right mini thoracotomy approach   Thrombocytopenia    Thyroid  nodule    Past Surgical History:  Procedure Laterality Date   BUBBLE STUDY  12/22/2019   Procedure: BUBBLE STUDY;  Surgeon: Tyler Darryle Ned, MD;  Location: [facility] ENDOSCOPY;  Service: Cardiovascular;;   CARDIAC CATHETERIZATION     COLONOSCOPY     MASS EXCISION Right 05/14/2021   Procedure: REPAIR RIGHT CHEST WALL HERNIA WITH MESH;  Surgeon: Tyler Elspeth BROCKS, MD;  Location: [facility] OR;  Service: Thoracic;  Laterality: Right;   MITRAL VALVE REPAIR Right 04/11/2020   Procedure: MINIMALLY INVASIVE MITRAL VALVE REPAIR (MVR) USING MEMO 4D RING SIZE ;  Surgeon: Tyler Sudie DEL, MD;  Location: [facility] OR;  Service: Open Heart Surgery;  Laterality: Right;   PROSTATECTOMY      RIGHT/LEFT HEART CATH AND CORONARY ANGIOGRAPHY N/A 12/22/2019   Procedure: RIGHT/LEFT HEART CATH AND CORONARY ANGIOGRAPHY;  Surgeon: Tyler Sharper, MD;  Location: [facility] INVASIVE CV LAB;  Service: Cardiovascular;  Laterality: N/A;   TEE WITHOUT CARDIOVERSION N/A 12/22/2019   Procedure: TRANSESOPHAGEAL ECHOCARDIOGRAM (TEE);  Surgeon: Tyler Darryle Ned, MD;  Location: [facility] ENDOSCOPY;  Service: Cardiovascular;  Laterality: N/A;   TEE WITHOUT CARDIOVERSION N/A 04/11/2020   Procedure: TRANSESOPHAGEAL ECHOCARDIOGRAM (TEE);  Surgeon: Tyler Sudie DEL, MD;  Location: [facility] OR;  Service: Open Heart Surgery;  Laterality: N/A;   TONSILLECTOMY     TOOTH EXTRACTION  03/07/2020   TYMPANOPLASTY  01/2016   Patient Active Problem List   Diagnosis Date Noted   Incisional hernia 05/14/2021   S/P minimally invasive mitral valve repair 04/11/2020   Mitral regurgitation 12/20/2019   VARICES OF OTHER SITES 12/30/2007   History of colonic polyps 12/25/2007     ONSET DATE: diagnosed one year ago; script 11/06/23  REFERRING DIAG: Hypophonia  THERAPY DIAG:  Dysarthria and anarthria  Dysphagia, oropharyngeal phase  Rationale for Evaluation and Treatment: Rehabilitation  SUBJECTIVE:   SUBJECTIVE STATEMENT: I had a cold last week.  Pt accompanied by: self  PERTINENT HISTORY: Pt dx'd with PD August 2024 with sx prior to that.  PAIN:  Are you  having pain? No  FALLS: Has patient fallen in last 6 months?  See PT evaluation for details   PATIENT GOALS: Improve speech and swallowing  OBJECTIVE:  Note: Objective measures were completed at Evaluation unless otherwise noted.  MBS 12/22/23: Pt demonstrates a mild oropharyngeal dysphagia consisting of good airway protection, but poor oral efficiency and mild pharyngeal residue. Pt recommended to continue regular diet and thin liquids with f/u with OP SLP for appropriate strategies.    Pt has inefficient oral phase with unnecessary lingual movement to gather  bolus and initiate swallow as well a piecemeal transit and loss of bolus to floor of mouth and buccal cavity. When cued to do so pt can increase efficency and improve bolus cohesion to decrease oral residue and reduce number of swallows gather it and swallow it all at once. Pt also has mild pharyngeal residue with thin liquids, likely due to oral deficits and slightly decreased ROM and duration of swallow. Again with cues for an effortful swallow pt increases these measures and reduces residue. Pt did not aspirate during the study, but expect some penetration or aspiration of oral residue and pharyngeal residue pooling after bites and sips. Also noted bumpy posterior pharyngeal wall and esophageal column against cervical spine, suggesting loss of tissue mass with age/sarcopenia. Pt to f/u with OP SLP to implement strategies. Would not suggest any texture changes.   DIGEST Swallow Severity Rating*             Safety: 0             Efficiency:1             Overall Pharyngeal Swallow Severity: 1 1: mild; 2: moderate; 3: severe; 4: profound  *The Dynamic Imaging Grade of Swallowing Toxicity is standardized for the head and neck cancer population, however, demonstrates promising clinical applications across populations to standardize the clinical rating of pharyngeal swallow safety and severity.   Factors that may increase risk of adverse event in presence of aspiration Noe & Lianne 2021):     Swallow Evaluation Recommendations Recommendations: PO diet PO Diet Recommendation: Regular;Thin liquids (Level 0) Liquid Administration via: Cup;Straw Medication Administration: Whole meds with liquid Supervision: Patient able to self-feed Swallowing strategies  : effortful swallow;Avoid mixed consistencies Postural changes: Position pt fully upright for meals;Stay upright 30-60 min after meals Oral care recommendations: Oral care BID (2x/day)  PATIENT REPORTED OUTCOME MEASURES (PROM): Communication  Participation Item Bank: returned 01/12/24 and pt scored 16/30 with lower scores indicating more barriers to communication due to pt's softer speech. All answers were 1-2 (with options from 0-3).                                                                                                                            TREATMENT DATE:   02/09/24: Due to illness, pt without practice last week.  SPEECH: SLP assessed pt's speech in conversation.  Demonstrating mild hoarseness this morning , clearing throat throughout  session. SLP mentioned some throat clearing alternatives (See pt instructions). In 19 minutes of conversation pt maintained WNL loudness.  SWALLOWING:SLP assessed pt's accuracy with his swallowing HEP. Pt was independent with these and demonstrated fatigue only with Claudette after 5 reps, Masako with 5 reps. Fatigue demonstrated with effortful after 8 reps. Erhard completed limited reps/day last week, so SLP encouraged him to attempt 25/day SLP and pt agreed pt could decr frequency to x1/week.  02/02/24: SPEECH: Pt spoke with SLP for 23 minutes with self correction for reduced speech volume once. Average volume was WNL.  SWALLOWING: SLP assessed pt's accuracy with his swallowing HEP. Pt was independent with these and demonstrated fatigue only with Claudette after 8 reps, Masako with 8 reps. Fatigue demonstrated with effortful after 8 reps; pt req'd less water for eliciting swallows than last week. Shawnmichael is completing 20 reps/day so SLP encouraged him to attempt 25/day this week in hopes of ultimately making it to 30/day.   01/28/24: SPEECH: SLP guided pt through practice with deliberate and mindful speech at a WNL volume. Practice in 12 minutes of conversation resulted in average 70dB until 10 minutes, when average decr'd to 68dB. Pt was unsure of the decr in volume until SLP incr'd pt's awareness. In 5 minutes conversation after this, pt maintained WNL volume. SLP told pt to pay attention to  people's reactions (or not) to his WNL conversational volume. SLP reiterated that pt is beginning to feel how WNL volume speech production feels. SWALLOWING: SLP guided pt through his swallow HEP and pt with noted fatigue after rep # 6 or 7 on each exercise. SLP told pt to completed at least 5 reps at a time or reps will be insufficient for progress. Pt procedure was independent.   01/26/24: ROYLENEBETHA Marty completed HEP with rare min A for hold time for [facility]. He identified hold times that were too short x2. He is completing all exercises but maybe a little less of Mendelsohn than others, he stated. SLP encouraged pt to follow suggestions for all exercises. SPEECH: Pt entered with WNL speech volume for 5 minutes conversation. He read his HEP with WNL volume. SLP engaged pt with improving speech loudness by having him provided descriptions that would necessitate 4-5 minute descriptions (bedroom, front yard, etc). Arun maintained WNL volume 85-90% of the time. Pt will make concerted effort to practice louder speech more consistently and then use when talking to his wife in the mornings.   01/21/24: SPEECH: SLP guided pt through his HEP for louder more articulate speech. Pt's loud /a/ averaged 86dB, and his sentences were 78dB prior to SLP usual mod-max cues for loudness; then they increased to 82dB. Pt was told to cont with his HEP, and SLP added sentence level responses to cont to make WNL volume speech a habit. In phrase responses pt averaged 69dB which improved to 70dB with initial mod cues faded to rare min cues for loudness. SLP varied cognitive load from minimal to mod-complex. Homework to practice at sentence level 2 15-minute periods or one 20-minute period/day.   01/19/24: SWALLOW: SLP guided pt through his swallowing HEP. Benefited from visual cues of a mirror for [place]; pt agreed. Masako req'd min A faded to independent. Initial min cues for effortful and then independent. With POs, pt  req'd cues for not taking a sip with a solid bolus  but just enough liquid to wet the solid bolus (no extra water in the mouth), min A faded to independent with precautions.  SPEECH:  SLP guided pt through his HEP for louder more articulate speech. Pt's loud /a/ averaged 85dB, and his sentences were 81dB prior to SLP mod cues for loudness; then they increased to 83dB. Pt was told to cont with his HEP.   01/14/24: Pt needs swallow HEP done next session.  SPEECH: SLP asked Avraham to cont to look at his schedule and let SLP know how this strategy is going next visit about keeping better organized. Pt's average with loud /a/ was 87dB, and pt wrote 4 more everyday sentences; Average today with these was 79dB. In two conversation segments, one of 8 minutes, one of 9 minutes, and one of 12 minutes pt maintained WNL volume. He noted that being in the ST room, with the sound level meter made it easier to concentrate on loudness and stated he needed to do better at home with increasing his loudness.   11/10/25BETHA Marty told SLP that he feels like he is not as organized with his days as he was previously and wonders if PD has lessened his skills with executive function. SLP suggested he sit down with his medical appointments and to-do list and spend ~5 minutes thinking through his next day. He thanked SLP for this suggestion. With loud /a/ pt's average was 87dB, and SLP assisted pt in developing everyday sentences. He was told to complete 4-5 more sentences until next session.  Average today with these was 77dB and SLP had pt incr loudness and he did, to average 79dB.   01/07/24: CPIB was not returned today. Will bring next time.  SWALLOW: Pt states the HEP for swallowing is difficult. In light of this SLP reviewed pt's HEP with him today. Pt stated his confidence in performing the HEP was better, from 8-9/10 (where 10=100% confident about procedure). SPEECH: SLP modeled loud /a/ for pt and provided rationale for loud  /a/, and he generated loud /a/ average 86dB. Pt has not been successful with routine completion with these. SLP provided some examples of how to accomplish that. SLP reviewed the rationale for reading out loud. With reading sentences, pt's average was 74dB with initial min A initially. SLP progressed into paragraphs and pt loudness was 71dB with initial cues for intent, deliberate, purposeful speech. For homework, SLP told pt to read out loud for 30 seconds x10 after loud /a/.   01/05/24: CPIB provided to pt today. Discussed/reviewed MBS with pt today. Stressed gather and swallow concept for each swallow was much like swallowing with more intent/purpose like SLP and pt had discussed about his speech in previous session. SLP assisted pt with practice with gather and swallow concept with swallows of water. Pt with more effort with swallow on the swallows he thought gather and swallow was more salient for him. SLP then developed HEP for pt and SLP assessed pt's procedure with these exercises and corrected PRN. Pt to complete at least 5 days/week with 30 reps/day.   12/22/23: NEEDS CPIB NEXT SESSION. Explained the nature of fluoroscopy with pt, and that this machine will record his swallowing with food textures and liquids. Explained about dB being a logarithmic scale. SLP shaped pt's loud /a/ to average 82dB, started at 73dB. Pt read sentences and req'd SLP usual mod-max cues to use intent and purpose with effortful voice. Digital recording provided beneficial auditory feedback of pt's productions. Homework to read out loud 30 seconds x10, and perform 10 loud /a/, BID.  12/09/23: n/a  PATIENT EDUCATION: Education details: See treatment date Person educated: Patient Education  method: Explanation, Demonstration, Verbal cues, and Handouts Education comprehension: verbalized understanding, returned demonstration, verbal cues required, and needs further education  HOME EXERCISE PROGRAM: HEP for louder  speech, HEP for dysphagia   GOALS: Goals reviewed with patient? Yes, generally  SHORT TERM GOALS: Target date:  01/26/24 (due to visit count)  Pt will undergo MBS if clinically indicated Baseline: Goal status: met  2.  Pt will demo 18/20 sentences with WNL volume Baseline:  Goal status: met  3.  Pt will demo WNL volume in 5 minutes conversation Baseline:  Goal status: met  4.  Pt will complete dysphagia HEP (if indicated following MBS) with rare min A Baseline:  Goal status: met   LONG TERM GOALS: Target date:  03/05/24  Pt will have improved PROM Baseline:  Goal status: INITIAL  2.  Pt will demo WNL volume in 10 minutes conversation, in 3 sessions Baseline: 01/28/24 Goal status: modified  3.  Pt will complete dysphagia HEP (if indicated following MBS) with mod I in 3 sessions Baseline: 02/09/24 Goal status: INITIAL  4.  Pt will demo knowledge about where to find reputable info about speech/language info with Parkinson's on the internet Baseline:  Goal status: INITIAL   ASSESSMENT:  CLINICAL IMPRESSION: Patient is a 73 y.o. M who was seen today for treatment of swallowing and/or speech and language in light of PD. See treatment date above for today's date for further details on today's session. He is showing progress on all LtGs. On date of eval he demonstrated incr'd volume and intent with short conversation today proving to be a good candidate for skilled ST. Wife states pt's volume is such that she has to ask him to repeat more often than she had to 5-6 years ago.    OBJECTIVE IMPAIRMENTS: Objective impairments include dysarthria and dysphagia. These impairments are limiting patient from household responsibilities, ADLs/IADLs, effectively communicating at home and in community, and safety when swallowing.Factors affecting potential to achieve goals and functional outcome are none noted today. Patient will benefit from skilled SLP services to address above  impairments and improve overall function.  REHAB POTENTIAL: Good  PLAN:  SLP FREQUENCY: 2x/week  SLP DURATION: 4 weeks, or 17 total sessions  PLANNED INTERVENTIONS: Aspiration precaution training, Pharyngeal strengthening exercises, Diet toleration management , Cueing hierachy, Internal/external aids, Oral motor exercises, Functional tasks, SLP instruction and feedback, Compensatory strategies, Patient/family education, [phone] Treatment of speech (30 or 45 min) , and [id number] Treatment of swallowing function    Nasrin Lanzo, CCC-SLP 02/09/2024, 11:15 AM

## 2024-02-11 ENCOUNTER — Ambulatory Visit

## 2024-02-11 DIAGNOSIS — G20A1 Parkinson's disease without dyskinesia, without mention of fluctuations: Secondary | ICD-10-CM

## 2024-02-11 DIAGNOSIS — M6281 Muscle weakness (generalized): Secondary | ICD-10-CM

## 2024-02-11 DIAGNOSIS — R29898 Other symptoms and signs involving the musculoskeletal system: Secondary | ICD-10-CM

## 2024-02-11 DIAGNOSIS — R471 Dysarthria and anarthria: Secondary | ICD-10-CM | POA: Diagnosis not present

## 2024-02-11 NOTE — Therapy (Signed)
 OUTPATIENT OCCUPATIONAL THERAPY PARKINSON'S TREATMENT  Patient Name: Tyler [name] MRN: [id number] DOB:12/29/1950, 73 y.o., male Today's Date: 02/11/2024  PCP: Janey Santos REFERRING PROVIDER: Evonnie Asberry RAMAN, DO  END OF SESSION:   OT End of Session - 02/11/24 1019     Visit Number 13    Date for Recertification  02/19/24    Authorization Type [employer]-VL:[place]    OT Start Time 1017    OT Stop Time 1100    OT Time Calculation (min) 43 min    Equipment Utilized During Treatment shapes and puzzles, beads and laces, red theraputty    Activity Tolerance Patient tolerated treatment well    Behavior During Therapy WFL for tasks assessed/performed                 Past Medical History:  Diagnosis Date   Adenomatous polyp of colon 02/2004   Anemia    Anxiety    Arthritis    BPH (benign prostatic hyperplasia)    Colonic varices    Degenerative joint disease    Depression    Depression    Fatty liver    GERD (gastroesophageal reflux disease)    Heart murmur    Hemorrhoids    Hyperlipidemia    Hypertension    IBS (irritable bowel syndrome)    Prostate cancer (HCC) 2011   S/P minimally invasive mitral valve repair 04/11/2020   Complex valvuloplasty including artificial Gore-tex neochord placement x8 with suture plication of posterior commissure and 30 mm Sorin Memo 3D ring annuloplasty via right mini thoracotomy approach   Thrombocytopenia    Thyroid  nodule    Past Surgical History:  Procedure Laterality Date   BUBBLE STUDY  12/22/2019   Procedure: BUBBLE STUDY;  Surgeon: Barbaraann Darryle Ned, MD;  Location: [facility] ENDOSCOPY;  Service: Cardiovascular;;   CARDIAC CATHETERIZATION     COLONOSCOPY     MASS EXCISION Right 05/14/2021   Procedure: REPAIR RIGHT CHEST WALL HERNIA WITH MESH;  Surgeon: Kerrin Elspeth BROCKS, MD;  Location: [facility] OR;  Service: Thoracic;  Laterality: Right;   MITRAL VALVE REPAIR Right 04/11/2020   Procedure: MINIMALLY INVASIVE MITRAL VALVE  REPAIR (MVR) USING MEMO 4D RING SIZE ;  Surgeon: Dusty Sudie DEL, MD;  Location: [facility] OR;  Service: Open Heart Surgery;  Laterality: Right;   PROSTATECTOMY     RIGHT/LEFT HEART CATH AND CORONARY ANGIOGRAPHY N/A 12/22/2019   Procedure: RIGHT/LEFT HEART CATH AND CORONARY ANGIOGRAPHY;  Surgeon: Wonda Sharper, MD;  Location: [facility] INVASIVE CV LAB;  Service: Cardiovascular;  Laterality: N/A;   TEE WITHOUT CARDIOVERSION N/A 12/22/2019   Procedure: TRANSESOPHAGEAL ECHOCARDIOGRAM (TEE);  Surgeon: Barbaraann Darryle Ned, MD;  [facility]: [facility] ENDOSCOPY;  Service: Cardiovascular;  Laterality: N/A;   TEE WITHOUT CARDIOVERSION N/A 04/11/2020   Procedure: TRANSESOPHAGEAL ECHOCARDIOGRAM (TEE);  Surgeon: Dusty Sudie DEL, MD;  Location: [facility] OR;  Service: Open Heart Surgery;  Laterality: N/A;   TONSILLECTOMY     TOOTH EXTRACTION  03/07/2020   TYMPANOPLASTY  01/2016   Patient Active Problem List   Diagnosis Date Noted   Incisional hernia 05/14/2021   S/P minimally invasive mitral valve repair 04/11/2020   Mitral regurgitation 12/20/2019   VARICES OF OTHER SITES 12/30/2007   History of colonic polyps 12/25/2007    ONSET DATE: 12/09/2023 referral date; diagnosed with PD in 10/2022  REFERRING DIAG:  Diagnosis  G20.A1 (ICD-10-CM) - Parkinson's disease without dyskinesia or fluctuating manifestations (HCC)    THERAPY DIAG:  Muscle weakness (generalized)  Parkinson's disease without dyskinesia  or fluctuating manifestations (HCC)  Other symptoms and signs involving the musculoskeletal system  Rationale for Evaluation and Treatment: Rehabilitation  SUBJECTIVE:   SUBJECTIVE STATEMENT: Pt reports things are going well and is still keeping up with HEPs.  Pt accompanied by: self  PERTINENT HISTORY: hx of mitral valve replacement, prostate cancer  PRECAUTIONS: Other: mitral valve repair with ring, fall, cancer hx, HTN  WEIGHT BEARING RESTRICTIONS: No  PAIN:  Are you having pain? No  FALLS: Has  patient fallen in last 6 months? Yes. Number of falls 1, treadmill  LIVING ENVIRONMENT: Lives with: lives with their spouse Lives in: House/apartment Stairs: stairs inside/outside, does have chair lift available if needed Has following equipment at home: Wheelchair (manual), shower chair, and Grab bars  PLOF: Independent  PATIENT GOALS: I want to maintain function as long as possible. Several years ago my strength kind of went away.   OBJECTIVE:  Note: Objective measures were completed at Evaluation unless otherwise noted.  HAND DOMINANCE: Right  ADLs: Overall ADLs: Reports independence but some tasks that require FM coordination takes longer Transfers/ambulation related to ADLs: I/Mod I Eating: Independent Grooming: Independent UB Dressing: Difficulty buttoning LB Dressing: Difficulty buttoning pants Toileting: Independent Bathing:  Mod Independent Tub Shower transfers: Mod Independent Equipment: Shower seat with back, Grab bars, and Walk in shower  IADLs: Shopping: Wife usually completes even before diagnosis Light housekeeping: Completing laundry and washes dishes  Meal Prep: wife completes cooking even before PD Community mobility: Driving Medication management: Completes Landscape architect: Independent Handwriting: 100% legible and Mild micrographia  MOBILITY STATUS: Independent and Hx of falls  POSTURE COMMENTS:  No Significant postural limitations  ACTIVITY TOLERANCE: Activity tolerance: Pt reports no changes  FUNCTIONAL OUTCOME MEASURES: Fastening/unfastening 3 buttons: 34.54 seconds Physical performance test: PPT#2 (simulated eating) 17.50 seconds & PPT#4 (donning/doffing jacket): 23.04 seconds    01/19/24   COORDINATION: 9 Hole Peg test: Right: 40.30 sec; Left: 38.43 sec Box and Blocks:  Right 32 blocks, Left 38 blocks (Did at first bring R hand around standing piece but corrected with verbal cue)  UE ROM:  WFL  UE MMT:   4/5  grossly  SENSATION:  WFL; however reports occasional numbness in thenar eminence of R thumb, reports doctor said it's most likely d/t overuse. Was a engineer, civil (consulting) for work.  MUSCLE TONE: RUE: Within functional limits and LUE: Within functional limits  COGNITION: Overall cognitive status: Within functional limits for tasks assessed  OBSERVATIONS: decreased strength, decreased FM coordination especially in R dominant hand, difficulty with ADLs especially buttoning/zipping up jacket, pt reports micrographia when writing longer sentences as well                                                                                                                    TREATMENT DATE:  02/11/24 Self care training completed for duration below including: Informed pt of upcoming re-cert and what to expect, pt verbalized understanding.  Pt reports actively completing PWR Hands and coordination activities, reports not always  completing putty or theraband HEPs. Assisted pt with de-cluttering memory notebook and scheduling when to complete issued OT HEPs for improved participation. Pt verbalized understanding of schedule.   Therapeutic exercise completed for duration below including: Removed 5 small items from red theraputty with B hands. Next completed 20 gross grasp B hands and 10 reps tip to tip pinch and pull of putty.  Therapeutic activities completed for duration below including:  Knotting, lacing, unknotting, and unlacing of plastic beads x 10 for fine motor coordination and incorporation of BUE into completion of functional tasks.  Completing tip to tip pinch of R hand picking up small shapes and recreating picture with shapes.  02/09/24 Engaged in discussion about community exercise classes and looking into the ones of which he has the information.   Coordination: picking up coins one at a time with RUE and LUE and then placing coins into coin slot.  Increased challenge to in-hand manipulation  with picking up coins until holding 8-10 coins in palm and then translating from palm to finger tips to place into coin slot.  Pt requiring min increased time but able to complete with good effort and amplitude. Handwriting: engaged in writing multiple sentence passage about a memorable trip.  Pt utilizing 2 different styles of pens with mild improvements noted with slightly weighted pen with coban wrapping to provide built up handle.  Pt demonstrating good legibility and sizing with handwriting with both pens.  OT reiterating use of print font when writing as an option to increase sizing and legibility as needed.     02/02/24 Large amplitude (PWR Hands): Engaged in PWR!Hands exercises with min cues for BIG movements. OT providing demonstration with verbal cues for amplitude. OT educating on importance of completion daily and/or prior to completing [facility] tasks. Coordination: pt demonstrating improvements in motor control and timing with focus on amplitude. Box and blocks: 39 blocks 9 hole peg test: 36.53 sec Review community support group and exercise classes for PD.  Pt reporting that is aware of some community classes but has not yet attended or further pursued any.  OT providing pt with information about Silver Sneakers (unsure if his insurance is eligible) and handouts for PD exercises at the [facility].  Pt reporting understanding of engagement in community resources for both exercise and support.   PATIENT EDUCATION: Education details: coordination, large amplitude hands, PD resources Person educated: Patient Education method: Explanation, Demonstration, and Handouts Education comprehension: verbalized understanding, returned demonstration, and verbal cues required  HOME EXERCISE PROGRAM: 12/31/23 - Seated Shoulder Flexion with Self-Anchored Resistance  - 1 x daily - 4 x weekly - 3 sets - 10 reps - Seated Shoulder Horizontal Abduction with Resistance  - 1 x daily - 4 x weekly - 3 sets - 10 reps -  Seated Elbow Flexion with Self-Anchored Resistance  - 1 x daily - 4 x weekly - 3 sets - 10 reps - Seated Elbow Extension with Self-Anchored Resistance  - 1 x daily - 4 x weekly - 3 sets - 10 reps  Access Code: [email or web address] URL: [email or web address] Date: 01/19/2024 Prepared by: [facility] - Outpatient  Rehab - [facility]  Exercises - Seated Gripping Towel  - 1 sets - 10 reps - Putty Squeezes  - 1 sets - 10 reps - Finger Pinch and Pull with Putty  - 1 sets - 10 reps - Wrist Flexion and Extension with Resistance Bar (putty)  - 1 sets - 10 reps  Access Code: [email or web address] URL: [email or web address] Date: 01/21/2024  Prepared by: Rocky Dutch  Exercises - Tip Pinch with Putty  - 1 x daily - 4 x weekly - 3 sets - 10 reps - 3-Point Pinch with Putty  - 1 x daily - 4 x weekly - 3 sets - 10 reps - Key Pinch with Putty  - 1 x daily - 4 x weekly - 1 sets - 10 reps - Finger Lumbricals with Putty  - 1 x daily - 4 x weekly - 3 sets - 10 reps - Removing Marbles from Putty  - 1 x daily - 4 x weekly - 1 sets  GOALS: Goals reviewed with patient? Yes  SHORT TERM GOALS: Target date: 02/18/24  Pt will verbalize understanding of adaptive strategies to increase ease with ADLS/IADLS   Baseline: New to OP OT Goal status: in progress  2.  Pt will demonstrate improved ease with fastening buttons as evidenced by decreasing 3 button/unbutton time by at least 5 seconds  Baseline: 34.54 seconds 01/14/24: 25.66 seconds Goal status: GOAL MET  3.  Pt will demonstrate improved FM coordination and functional use of RUE by increasing Box and Blocks score by at least 3 blocks Baseline: Right 32 blocks, Left 38 blocks 01/14/24: 31 blocks R hand Goal status: in progress  4.  Pt will be independent with HEPs for UE strength and FM coordination with handouts for reference prn  Baseline: New to OP OT  Goal status: in progress  5. Pt will write short paragraph with 75% legibility and  mild to no micrographia Baseline:  Wrote short sentence ([place]) with 100% legible and Mild micrographia. Pt reports micrographia worsens the longer he writes. Goal status: in progress  6. Pt will demonstrate an improved average PSFS score of at least 2 points, or individual score improvement of 3 points   Baseline:   01/19/24: 5.0  Goal status: in progress   LONG TERM GOALS: Target date: 02/18/24  Pt will write short paragraph with 100% legibility and mild to no micrographia Baseline:  Wrote short sentence ([place]) with 100% legible and Mild micrographia. Pt reports micrographia worsens the longer he writes. Goal status: in progress  2.  Pt will demonstrate improved fine motor coordination for ADLs as evidenced by decreasing 9 hole peg test score for R hand by at least 3 secs  Baseline: Right: 40.30 sec; Left: 38.43 sec 02/02/24: Right: 36.53 sec Goal status: MET  3.  Pt will demonstrate increased ease with dressing as evidenced by decreasing PPT#4 (don/ doff jacket) to 20 secs or less  Baseline: PPT#4 (donning/doffing jacket): 23.04 seconds 01/14/24: 12.18 seconds  Goal status: GOAL MET   4.  Pt will verbalize understanding of ways to prevent future PD related complications and PD community resources. Baseline: Initiated with PWR!Moves classes Goal status: in progress   5.  Pt will demonstrate improved FM coordination and functional use of RUE by increasing Box and Blocks score by at least 6 blocks Baseline: Right 32 blocks, Left 38 blocks 01/14/24: 31 blocks  02/02/24: 39 blocks Goal status: MET   ASSESSMENT:  CLINICAL IMPRESSION: Patient is a 73 y.o. male who was seen today for occupational therapy tx for PD management. Pt highly motivated to participate and reports regular completion of coordination and PWR!Hands. Reports he hasn't completed putty or theraband exercises, assisted with scheduling when to complete different HEPs for  improved participation. Patient will continue to benefit from skilled therapy services to better manage occupational  barriers secondary to Parkinson's and improve independence and safety with ADLs and IADLs.   PERFORMANCE DEFICITS: in functional skills including ADLs, IADLs, coordination, dexterity, strength, Fine motor control, balance, cardiopulmonary status limiting function, and UE functional use, cognitive skills including safety awareness and sequencing, and psychosocial skills including coping strategies and environmental adaptation.   IMPAIRMENTS: are limiting patient from ADLs, IADLs, and leisure.     PLAN:  OT FREQUENCY: 2x/week  OT DURATION: 8 weeks  PLANNED INTERVENTIONS: 97168 OT Re-evaluation, 97535 self care/ADL training, 02889 therapeutic exercise, 97530 therapeutic activity, 97112 neuromuscular re-education, 97140 manual therapy, passive range of motion, functional mobility training, energy conservation, coping strategies training, patient/family education, and DME and/or AE instructions  RECOMMENDED OTHER SERVICES: none noted   CONSULTED AND AGREED WITH PLAN OF CARE: Patient  PLAN FOR NEXT SESSION: re-cert or d/c 87/82/74 Coordination activities Grip strengthening Continue BIG movements Further AE recommendations prn   Rocky Dutch, OTR/L 02/11/2024, 11:08 AM  [facility] at [facility] [address] [place], [place] [address] Phone # [phone] Fax # [phone]

## 2024-02-16 ENCOUNTER — Ambulatory Visit: Admitting: Occupational Therapy

## 2024-02-16 DIAGNOSIS — R29818 Other symptoms and signs involving the nervous system: Secondary | ICD-10-CM

## 2024-02-16 DIAGNOSIS — R278 Other lack of coordination: Secondary | ICD-10-CM

## 2024-02-16 DIAGNOSIS — R471 Dysarthria and anarthria: Secondary | ICD-10-CM | POA: Diagnosis not present

## 2024-02-16 DIAGNOSIS — M6281 Muscle weakness (generalized): Secondary | ICD-10-CM

## 2024-02-16 NOTE — Therapy (Signed)
 OUTPATIENT OCCUPATIONAL THERAPY PARKINSON'S TREATMENT  Patient Name: Tyler [name] MRN: [id number] DOB:March 29, 1950, 73 y.o., male Today's Date: 02/16/2024  PCP: Janey Santos REFERRING PROVIDER: Evonnie Asberry RAMAN, DO  END OF SESSION:   OT End of Session - 02/16/24 1054     Visit Number 14    Number of Visits 17    Date for Recertification  02/19/24    Authorization Type [employer]-VL:[place]    OT Start Time 1017    OT Stop Time 1057    OT Time Calculation (min) 40 min    Equipment Utilized During Treatment shapes and puzzles, beads and laces, red theraputty    Activity Tolerance Patient tolerated treatment well    Behavior During Therapy WFL for tasks assessed/performed                  Past Medical History:  Diagnosis Date   Adenomatous polyp of colon 02/2004   Anemia    Anxiety    Arthritis    BPH (benign prostatic hyperplasia)    Colonic varices    Degenerative joint disease    Depression    Depression    Fatty liver    GERD (gastroesophageal reflux disease)    Heart murmur    Hemorrhoids    Hyperlipidemia    Hypertension    IBS (irritable bowel syndrome)    Prostate cancer (HCC) 2011   S/P minimally invasive mitral valve repair 04/11/2020   Complex valvuloplasty including artificial Gore-tex neochord placement x8 with suture plication of posterior commissure and 30 mm Sorin Memo 3D ring annuloplasty via right mini thoracotomy approach   Thrombocytopenia    Thyroid  nodule    Past Surgical History:  Procedure Laterality Date   BUBBLE STUDY  12/22/2019   Procedure: BUBBLE STUDY;  Surgeon: Barbaraann Darryle Ned, MD;  Location: [facility] ENDOSCOPY;  Service: Cardiovascular;;   CARDIAC CATHETERIZATION     COLONOSCOPY     MASS EXCISION Right 05/14/2021   Procedure: REPAIR RIGHT CHEST WALL HERNIA WITH MESH;  Surgeon: Kerrin Elspeth BROCKS, MD;  Location: [facility];  Service: Thoracic;  Laterality: Right;   MITRAL VALVE REPAIR Right 04/11/2020   Procedure:  MINIMALLY INVASIVE MITRAL VALVE REPAIR (MVR) USING MEMO 4D RING SIZE ;  Surgeon: Dusty Sudie DEL, MD;  Location: [facility] OR;  Service: Open Heart Surgery;  Laterality: Right;   PROSTATECTOMY     RIGHT/LEFT HEART CATH AND CORONARY ANGIOGRAPHY N/A 12/22/2019   Procedure: RIGHT/LEFT HEART CATH AND CORONARY ANGIOGRAPHY;  Surgeon: Wonda Sharper, MD;  Location: [facility] INVASIVE CV LAB;  Service: Cardiovascular;  Laterality: N/A;   TEE WITHOUT CARDIOVERSION N/A 12/22/2019   Procedure: TRANSESOPHAGEAL ECHOCARDIOGRAM (TEE);  Surgeon: Barbaraann Darryle Ned, MD;  Location: [facility] ENDOSCOPY;  Service: Cardiovascular;  Laterality: N/A;   TEE WITHOUT CARDIOVERSION N/A 04/11/2020   Procedure: TRANSESOPHAGEAL ECHOCARDIOGRAM (TEE);  Surgeon: Dusty Sudie DEL, MD;  Location: [facility] OR;  Service: Open Heart Surgery;  Laterality: N/A;   TONSILLECTOMY     TOOTH EXTRACTION  03/07/2020   TYMPANOPLASTY  01/2016   Patient Active Problem List   Diagnosis Date Noted   Incisional hernia 05/14/2021   S/P minimally invasive mitral valve repair 04/11/2020   Mitral regurgitation 12/20/2019   VARICES OF OTHER SITES 12/30/2007   History of colonic polyps 12/25/2007    ONSET DATE: 12/09/2023 referral date; diagnosed with PD in 10/2022  REFERRING DIAG:  Diagnosis  G20.A1 (ICD-10-CM) - Parkinson's disease without dyskinesia or fluctuating manifestations (HCC)    THERAPY DIAG:  Muscle weakness (generalized)  Other symptoms and signs involving the nervous system  Other lack of coordination  Rationale for Evaluation and Treatment: Rehabilitation  SUBJECTIVE:   SUBJECTIVE STATEMENT: Pt reports that he went to a clinic yesterday to get checked out and get some medication as he has been having a cold and wanted to make sure it wasn't pneumonia.  Pt accompanied by: self  PERTINENT HISTORY: hx of mitral valve replacement, prostate cancer  PRECAUTIONS: Other: mitral valve repair with ring, fall, cancer hx, HTN  WEIGHT BEARING  RESTRICTIONS: No  PAIN:  Are you having pain? No  FALLS: Has patient fallen in last 6 months? Yes. Number of falls 1, treadmill  LIVING ENVIRONMENT: Lives with: lives with their spouse Lives in: House/apartment Stairs: stairs inside/outside, does have chair lift available if needed Has following equipment at home: Wheelchair (manual), shower chair, and Grab bars  PLOF: Independent  PATIENT GOALS: I want to maintain function as long as possible. Several years ago my strength kind of went away.   OBJECTIVE:  Note: Objective measures were completed at Evaluation unless otherwise noted.  HAND DOMINANCE: Right  ADLs: Overall ADLs: Reports independence but some tasks that require FM coordination takes longer Transfers/ambulation related to ADLs: I/Mod I Eating: Independent Grooming: Independent UB Dressing: Difficulty buttoning LB Dressing: Difficulty buttoning pants Toileting: Independent Bathing:  Mod Independent Tub Shower transfers: Mod Independent Equipment: Shower seat with back, Grab bars, and Walk in shower  IADLs: Shopping: Wife usually completes even before diagnosis Light housekeeping: Completing laundry and washes dishes  Meal Prep: wife completes cooking even before PD Community mobility: Driving Medication management: Completes Landscape architect: Independent Handwriting: 100% legible and Mild micrographia  MOBILITY STATUS: Independent and Hx of falls  POSTURE COMMENTS:  No Significant postural limitations  ACTIVITY TOLERANCE: Activity tolerance: Pt reports no changes  FUNCTIONAL OUTCOME MEASURES: Fastening/unfastening 3 buttons: 34.54 seconds Physical performance test: PPT#2 (simulated eating) 17.50 seconds & PPT#4 (donning/doffing jacket): 23.04 seconds    01/19/24   COORDINATION: 9 Hole Peg test: Right: 40.30 sec; Left: 38.43 sec Box and Blocks:  Right 32 blocks, Left 38 blocks (Did at first bring R hand around standing piece but corrected  with verbal cue)  UE ROM:  WFL  UE MMT:   4/5 grossly  SENSATION:  WFL; however reports occasional numbness in thenar eminence of R thumb, reports doctor said it's most likely d/t overuse. Was a engineer, civil (consulting) for work.  MUSCLE TONE: RUE: Within functional limits and LUE: Within functional limits  COGNITION: Overall cognitive status: Within functional limits for tasks assessed  OBSERVATIONS: decreased strength, decreased FM coordination especially in R dominant hand, difficulty with ADLs especially buttoning/zipping up jacket, pt reports micrographia when writing longer sentences as well                                                                                                                    TREATMENT DATE:  02/16/24 Discussed upcoming end of cert period and typical routine with  PD screens and/or evaluations after d/c.  Encouraged pt to continue with exercise schedule and routine.   Handwriting: pt making an check list with focus on handwriting sizing and legibility while also having a new check list to keep up with routine of large movement exercises every other day and coordination/PWR hands that he completes daily.   Large amplitude: engaged in reaching outside BOS and across midline in standing while reaching towards vertical targets.  OT providing supervision and cues for amplitude during reaching and weight shifting.   Bag exercises: Simulated ADLs for the following activities (using a plastic bag/scarf): donning shirt, drying back, pulling down shirt, pulling up socks and donning pants.  OT challenging pt to review each exercise to ensure that he is completing each appropriately.  Pt utilizing handout for sequencing.  OT providing additional cues and highlighting key words on handout to increase recall.  Pt completing 5 of each to ensure proper technique.      02/11/24 Self care training completed for duration below including: Informed pt of upcoming re-cert and what  to expect, pt verbalized understanding.  Pt reports actively completing PWR Hands and coordination activities, reports not always completing putty or theraband HEPs. Assisted pt with de-cluttering memory notebook and scheduling when to complete issued OT HEPs for improved participation. Pt verbalized understanding of schedule.   Therapeutic exercise completed for duration below including: Removed 5 small items from red theraputty with B hands. Next completed 20 gross grasp B hands and 10 reps tip to tip pinch and pull of putty.  Therapeutic activities completed for duration below including:  Knotting, lacing, unknotting, and unlacing of plastic beads x 10 for fine motor coordination and incorporation of BUE into completion of functional tasks.  Completing tip to tip pinch of R hand picking up small shapes and recreating picture with shapes.  02/09/24 Engaged in discussion about community exercise classes and looking into the ones of which he has the information.   Coordination: picking up coins one at a time with RUE and LUE and then placing coins into coin slot.  Increased challenge to in-hand manipulation with picking up coins until holding 8-10 coins in palm and then translating from palm to finger tips to place into coin slot.  Pt requiring min increased time but able to complete with good effort and amplitude. Handwriting: engaged in writing multiple sentence passage about a memorable trip.  Pt utilizing 2 different styles of pens with mild improvements noted with slightly weighted pen with coban wrapping to provide built up handle.  Pt demonstrating good legibility and sizing with handwriting with both pens.  OT reiterating use of print font when writing as an option to increase sizing and legibility as needed.     PATIENT EDUCATION: Education details: coordination, large amplitude hands, PD resources Person educated: Patient Education method: Explanation, Demonstration, and  Handouts Education comprehension: verbalized understanding, returned demonstration, and verbal cues required  HOME EXERCISE PROGRAM: 12/31/23 - Seated Shoulder Flexion with Self-Anchored Resistance  - 1 x daily - 4 x weekly - 3 sets - 10 reps - Seated Shoulder Horizontal Abduction with Resistance  - 1 x daily - 4 x weekly - 3 sets - 10 reps - Seated Elbow Flexion with Self-Anchored Resistance  - 1 x daily - 4 x weekly - 3 sets - 10 reps - Seated Elbow Extension with Self-Anchored Resistance  - 1 x daily - 4 x weekly - 3 sets - 10 reps  Access Code: [email or web address] URL: [email or web address]  Date: 01/19/2024 Prepared by: [facility] - Outpatient  Rehab - [facility]  Exercises - Seated Gripping Towel  - 1 sets - 10 reps - Putty Squeezes  - 1 sets - 10 reps - Finger Pinch and Pull with Putty  - 1 sets - 10 reps - Wrist Flexion and Extension with Resistance Bar (putty)  - 1 sets - 10 reps  Access Code: [email or web address] URL: [email or web address] Date: 01/21/2024 Prepared by: Rocky Dutch  Exercises - Tip Pinch with Putty  - 1 x daily - 4 x weekly - 3 sets - 10 reps - 3-Point Pinch with Putty  - 1 x daily - 4 x weekly - 3 sets - 10 reps - Key Pinch with Putty  - 1 x daily - 4 x weekly - 1 sets - 10 reps - Finger Lumbricals with Putty  - 1 x daily - 4 x weekly - 3 sets - 10 reps - Removing Marbles from Putty  - 1 x daily - 4 x weekly - 1 sets  GOALS: Goals reviewed with patient? Yes  SHORT TERM GOALS: Target date: 02/18/24  Pt will verbalize understanding of adaptive strategies to increase ease with ADLS/IADLS   Baseline: New to OP OT Goal status: in progress  2.  Pt will demonstrate improved ease with fastening buttons as evidenced by decreasing 3 button/unbutton time by at least 5 seconds  Baseline: 34.54 seconds 01/14/24: 25.66 seconds Goal status: GOAL MET  3.  Pt will demonstrate improved FM coordination and functional use of RUE by increasing Box and  Blocks score by at least 3 blocks Baseline: Right 32 blocks, Left 38 blocks 01/14/24: 31 blocks R hand Goal status: in progress  4.  Pt will be independent with HEPs for UE strength and FM coordination with handouts for reference prn  Baseline: New to OP OT  Goal status: in progress  5. Pt will write short paragraph with 75% legibility and mild to no micrographia Baseline:  Wrote short sentence ([place]) with 100% legible and Mild micrographia. Pt reports micrographia worsens the longer he writes. Goal status: in progress  6. Pt will demonstrate an improved average PSFS score of at least 2 points, or individual score improvement of 3 points   Baseline:   01/19/24: 5.0  Goal status: in progress   LONG TERM GOALS: Target date: 02/18/24  Pt will write short paragraph with 100% legibility and mild to no micrographia Baseline:  Wrote short sentence ([place]) with 100% legible and Mild micrographia. Pt reports micrographia worsens the longer he writes. Goal status: in progress  2.  Pt will demonstrate improved fine motor coordination for ADLs as evidenced by decreasing 9 hole peg test score for R hand by at least 3 secs  Baseline: Right: 40.30 sec; Left: 38.43 sec 02/02/24: Right: 36.53 sec Goal status: MET  3.  Pt will demonstrate increased ease with dressing as evidenced by decreasing PPT#4 (don/ doff jacket) to 20 secs or less  Baseline: PPT#4 (donning/doffing jacket): 23.04 seconds 01/14/24: 12.18 seconds  Goal status: GOAL MET   4.  Pt will verbalize understanding of ways to prevent future PD related complications and PD community resources. Baseline: Initiated with PWR!Moves classes Goal status: in progress   5.  Pt will demonstrate improved FM coordination and functional use of RUE by increasing Box and Blocks score by at least 6 blocks Baseline: Right 32 blocks, Left 38 blocks 01/14/24: 31 blocks  02/02/24:  39 blocks Goal status:  MET   ASSESSMENT:  CLINICAL IMPRESSION: Patient is a 73 y.o. male who was seen today for occupational therapy tx for PD management. Pt highly motivated to participate and reports regular completion of coordination and PWR!Hands. Pt demonstrating improved legibility of handwriting when re-writing his exercise check list.  Pt demonstrating good use of check list previously to continue to complete exercises.  Pt demonstrating good technique with only min cues for big opening with bag exercises.  Patient will continue to benefit from skilled therapy services to better manage occupational barriers secondary to Parkinson's and improve independence and safety with ADLs and IADLs.   PERFORMANCE DEFICITS: in functional skills including ADLs, IADLs, coordination, dexterity, strength, Fine motor control, balance, cardiopulmonary status limiting function, and UE functional use, cognitive skills including safety awareness and sequencing, and psychosocial skills including coping strategies and environmental adaptation.   IMPAIRMENTS: are limiting patient from ADLs, IADLs, and leisure.     PLAN:  OT FREQUENCY: 2x/week  OT DURATION: 8 weeks  PLANNED INTERVENTIONS: 97168 OT Re-evaluation, 97535 self care/ADL training, 02889 therapeutic exercise, 97530 therapeutic activity, 97112 neuromuscular re-education, 97140 manual therapy, passive range of motion, functional mobility training, energy conservation, coping strategies training, patient/family education, and DME and/or AE instructions  RECOMMENDED OTHER SERVICES: none noted   CONSULTED AND AGREED WITH PLAN OF CARE: Patient  PLAN FOR NEXT SESSION: re-cert or d/c 87/82/74 (should be good for d/c at PD screen in 6-8 months) Coordination activities Grip strengthening Continue BIG movements Further AE recommendations prn   KAYLENE DOMINO, OTR/L 02/16/2024, 10:54 AM  [facility] at [facility] [address] [place], [place] [address] Phone # [phone] Fax # [phone]

## 2024-02-17 NOTE — Therapy (Signed)
 OUTPATIENT SPEECH LANGUAGE PATHOLOGY [facility]   Patient Name: Tyler [name] MRN: [id number] DOB:1950-03-16, 73 y.o., male Today's Date: 02/18/2024  PCP: Janey Santos, MD REFERRING PROVIDER: Evonnie Stabs, DO  END OF SESSION:  End of Session - 02/18/24 1106     Visit Number 13    Number of Visits 17    Date for Recertification  02/27/24    SLP Start Time 1104    SLP Stop Time  1145    SLP Time Calculation (min) 41 min    Activity Tolerance Patient tolerated treatment well                Past Medical History:  Diagnosis Date   Adenomatous polyp of colon 02/2004   Anemia    Anxiety    Arthritis    BPH (benign prostatic hyperplasia)    Colonic varices    Degenerative joint disease    Depression    Depression    Fatty liver    GERD (gastroesophageal reflux disease)    Heart murmur    Hemorrhoids    Hyperlipidemia    Hypertension    IBS (irritable bowel syndrome)    Prostate cancer (HCC) 2011   S/P minimally invasive mitral valve repair 04/11/2020   Complex valvuloplasty including artificial Gore-tex neochord placement x8 with suture plication of posterior commissure and 30 mm Sorin Memo 3D ring annuloplasty via right mini thoracotomy approach   Thrombocytopenia    Thyroid  nodule    Past Surgical History:  Procedure Laterality Date   BUBBLE STUDY  12/22/2019   Procedure: BUBBLE STUDY;  Surgeon: Barbaraann Darryle Ned, MD;  Location: [facility] ENDOSCOPY;  Service: Cardiovascular;;   CARDIAC CATHETERIZATION     COLONOSCOPY     MASS EXCISION Right 05/14/2021   Procedure: REPAIR RIGHT CHEST WALL HERNIA WITH MESH;  Surgeon: Kerrin Elspeth BROCKS, MD;  Location: [facility] OR;  Service: Thoracic;  Laterality: Right;   MITRAL VALVE REPAIR Right 04/11/2020   Procedure: MINIMALLY INVASIVE MITRAL VALVE REPAIR (MVR) USING MEMO 4D RING SIZE ;  Surgeon: Dusty Sudie DEL, MD;  Location: [facility] OR;  Service: Open Heart Surgery;  Laterality: Right;   PROSTATECTOMY      RIGHT/LEFT HEART CATH AND CORONARY ANGIOGRAPHY N/A 12/22/2019   Procedure: RIGHT/LEFT HEART CATH AND CORONARY ANGIOGRAPHY;  Surgeon: Wonda Sharper, MD;  Location: [facility] INVASIVE CV LAB;  Service: Cardiovascular;  Laterality: N/A;   TEE WITHOUT CARDIOVERSION N/A 12/22/2019   Procedure: TRANSESOPHAGEAL ECHOCARDIOGRAM (TEE);  Surgeon: Barbaraann Darryle Ned, MD;  Location: [facility] ENDOSCOPY;  Service: Cardiovascular;  Laterality: N/A;   TEE WITHOUT CARDIOVERSION N/A 04/11/2020   Procedure: TRANSESOPHAGEAL ECHOCARDIOGRAM (TEE);  Surgeon: Dusty Sudie DEL, MD;  Location: [facility] OR;  Service: Open Heart Surgery;  Laterality: N/A;   TONSILLECTOMY     TOOTH EXTRACTION  03/07/2020   TYMPANOPLASTY  01/2016   Patient Active Problem List   Diagnosis Date Noted   Incisional hernia 05/14/2021   S/P minimally invasive mitral valve repair 04/11/2020   Mitral regurgitation 12/20/2019   VARICES OF OTHER SITES 12/30/2007   History of colonic polyps 12/25/2007     ONSET DATE: diagnosed one year ago; script 11/06/23  REFERRING DIAG: Hypophonia  THERAPY DIAG:  Dysarthria and anarthria  Dysphagia, oropharyngeal phase  Rationale for Evaluation and Treatment: Rehabilitation  SUBJECTIVE:   SUBJECTIVE STATEMENT: I got put on antibiotics  Pt accompanied by: self  PERTINENT HISTORY: Pt dx'd with PD August 2024 with sx prior to that.  PAIN:  Are you  having pain? No  FALLS: Has patient fallen in last 6 months?  See PT evaluation for details   PATIENT GOALS: Improve speech and swallowing  OBJECTIVE:  Note: Objective measures were completed at Evaluation unless otherwise noted.  MBS 12/22/23: Pt demonstrates a mild oropharyngeal dysphagia consisting of good airway protection, but poor oral efficiency and mild pharyngeal residue. Pt recommended to continue regular diet and thin liquids with f/u with OP SLP for appropriate strategies.    Pt has inefficient oral phase with unnecessary lingual movement to gather  bolus and initiate swallow as well a piecemeal transit and loss of bolus to floor of mouth and buccal cavity. When cued to do so pt can increase efficency and improve bolus cohesion to decrease oral residue and reduce number of swallows gather it and swallow it all at once. Pt also has mild pharyngeal residue with thin liquids, likely due to oral deficits and slightly decreased ROM and duration of swallow. Again with cues for an effortful swallow pt increases these measures and reduces residue. Pt did not aspirate during the study, but expect some penetration or aspiration of oral residue and pharyngeal residue pooling after bites and sips. Also noted bumpy posterior pharyngeal wall and esophageal column against cervical spine, suggesting loss of tissue mass with age/sarcopenia. Pt to f/u with OP SLP to implement strategies. Would not suggest any texture changes.   DIGEST Swallow Severity Rating*             Safety: 0             Efficiency:1             Overall Pharyngeal Swallow Severity: 1 1: mild; 2: moderate; 3: severe; 4: profound  *The Dynamic Imaging Grade of Swallowing Toxicity is standardized for the head and neck cancer population, however, demonstrates promising clinical applications across populations to standardize the clinical rating of pharyngeal swallow safety and severity.   Factors that may increase risk of adverse event in presence of aspiration Noe & Lianne 2021):     Swallow Evaluation Recommendations Recommendations: PO diet PO Diet Recommendation: Regular;Thin liquids (Level 0) Liquid Administration via: Cup;Straw Medication Administration: Whole meds with liquid Supervision: Patient able to self-feed Swallowing strategies  : effortful swallow;Avoid mixed consistencies Postural changes: Position pt fully upright for meals;Stay upright 30-60 min after meals Oral care recommendations: Oral care BID (2x/day)  PATIENT REPORTED OUTCOME MEASURES (PROM): Communication  Participation Item Bank: returned 01/12/24 and pt scored 16/30 with lower scores indicating more barriers to communication due to pt's softer speech. All answers were 1-2 (with options from 0-3).                                                                                                                            TREATMENT DATE:   02/18/24: SWALLOW: SLP reminded pt of goal of 25 with 30 reps ultimately the goal/day for each exercise. Pt has not completed since previous session due to  illness. Today SLP assessed pt's procedure and was WNL with each exercise. SPEECH: Pt maintained WNL speech volume with mask today in 10 minutes conversation. SLP then took pt outdoors and he maintained audible volume with faint auto noise in the parking lot. Suspect pt will require 2-3 more visits.  02/09/24: Due to illness, pt without practice last week.  SPEECH: SLP assessed pt's speech in conversation.  Demonstrating mild hoarseness this morning , clearing throat throughout session. SLP mentioned some throat clearing alternatives (See pt instructions). In 19 minutes of conversation pt maintained WNL loudness.  SWALLOWING:SLP assessed pt's accuracy with his swallowing HEP. Pt was independent with these and demonstrated fatigue only with Claudette after 5 reps, Masako with 5 reps. Fatigue demonstrated with effortful after 8 reps. Dillion completed limited reps/day last week, so SLP encouraged him to attempt 25/day SLP and pt agreed pt could decr frequency to x1/week.  02/02/24: SPEECH: Pt spoke with SLP for 23 minutes with self correction for reduced speech volume once. Average volume was WNL.  SWALLOWING: SLP assessed pt's accuracy with his swallowing HEP. Pt was independent with these and demonstrated fatigue only with Claudette after 8 reps, Masako with 8 reps. Fatigue demonstrated with effortful after 8 reps; pt req'd less water for eliciting swallows than last week. Ernestine is completing 20 reps/day so SLP encouraged  him to attempt 25/day this week in hopes of ultimately making it to 30/day.   01/28/24: SPEECH: SLP guided pt through practice with deliberate and mindful speech at a WNL volume. Practice in 12 minutes of conversation resulted in average 70dB until 10 minutes, when average decr'd to 68dB. Pt was unsure of the decr in volume until SLP incr'd pt's awareness. In 5 minutes conversation after this, pt maintained WNL volume. SLP told pt to pay attention to people's reactions (or not) to his WNL conversational volume. SLP reiterated that pt is beginning to feel how WNL volume speech production feels. SWALLOWING: SLP guided pt through his swallow HEP and pt with noted fatigue after rep # 6 or 7 on each exercise. SLP told pt to completed at least 5 reps at a time or reps will be insufficient for progress. Pt procedure was independent.   01/26/24: ROYLENEBETHA Marty completed HEP with rare min A for hold time for [facility]. He identified hold times that were too short x2. He is completing all exercises but maybe a little less of [place] than others, he stated. SLP encouraged pt to follow suggestions for all exercises. SPEECH: Pt entered with WNL speech volume for 5 minutes conversation. He read his HEP with WNL volume. SLP engaged pt with improving speech loudness by having him provided descriptions that would necessitate 4-5 minute descriptions (bedroom, front yard, etc). Khyre maintained WNL volume 85-90% of the time. Pt will make concerted effort to practice louder speech more consistently and then use when talking to his wife in the mornings.   01/21/24: SPEECH: SLP guided pt through his HEP for louder more articulate speech. Pt's loud /a/ averaged 86dB, and his sentences were 78dB prior to SLP usual mod-max cues for loudness; then they increased to 82dB. Pt was told to cont with his HEP, and SLP added sentence level responses to cont to make WNL volume speech a habit. In phrase responses pt averaged 69dB which  improved to 70dB with initial mod cues faded to rare min cues for loudness. SLP varied cognitive load from minimal to mod-complex. Homework to practice at sentence level 2 15-minute periods or one  20-minute period/day.   01/19/24: SWALLOW: SLP guided pt through his swallowing HEP. Benefited from visual cues of a mirror for [place]; pt agreed. Masako req'd min A faded to independent. Initial min cues for effortful and then independent. With POs, pt req'd cues for not taking a sip with a solid bolus  but just enough liquid to wet the solid bolus (no extra water in the mouth), min A faded to independent with precautions.  SPEECH: SLP guided pt through his HEP for louder more articulate speech. Pt's loud /a/ averaged 85dB, and his sentences were 81dB prior to SLP mod cues for loudness; then they increased to 83dB. Pt was told to cont with his HEP.   01/14/24: Pt needs swallow HEP done next session.  SPEECH: SLP asked Maximilian to cont to look at his schedule and let SLP know how this strategy is going next visit about keeping better organized. Pt's average with loud /a/ was 87dB, and pt wrote 4 more everyday sentences; Average today with these was 79dB. In two conversation segments, one of 8 minutes, one of 9 minutes, and one of 12 minutes pt maintained WNL volume. He noted that being in the ST room, with the sound level meter made it easier to concentrate on loudness and stated he needed to do better at home with increasing his loudness.   11/10/25BETHA Marty told SLP that he feels like he is not as organized with his days as he was previously and wonders if PD has lessened his skills with executive function. SLP suggested he sit down with his medical appointments and to-do list and spend ~5 minutes thinking through his next day. He thanked SLP for this suggestion. With loud /a/ pt's average was 87dB, and SLP assisted pt in developing everyday sentences. He was told to complete 4-5 more sentences until next  session.  Average today with these was 77dB and SLP had pt incr loudness and he did, to average 79dB.   01/07/24: CPIB was not returned today. Will bring next time.  SWALLOW: Pt states the HEP for swallowing is difficult. In light of this SLP reviewed pt's HEP with him today. Pt stated his confidence in performing the HEP was better, from 8-9/10 (where 10=100% confident about procedure). SPEECH: SLP modeled loud /a/ for pt and provided rationale for loud /a/, and he generated loud /a/ average 86dB. Pt has not been successful with routine completion with these. SLP provided some examples of how to accomplish that. SLP reviewed the rationale for reading out loud. With reading sentences, pt's average was 74dB with initial min A initially. SLP progressed into paragraphs and pt loudness was 71dB with initial cues for intent, deliberate, purposeful speech. For homework, SLP told pt to read out loud for 30 seconds x10 after loud /a/.   01/05/24: CPIB provided to pt today. Discussed/reviewed MBS with pt today. Stressed gather and swallow concept for each swallow was much like swallowing with more intent/purpose like SLP and pt had discussed about his speech in previous session. SLP assisted pt with practice with gather and swallow concept with swallows of water. Pt with more effort with swallow on the swallows he thought gather and swallow was more salient for him. SLP then developed HEP for pt and SLP assessed pt's procedure with these exercises and corrected PRN. Pt to complete at least 5 days/week with 30 reps/day.   12/22/23: NEEDS CPIB NEXT SESSION. Explained the nature of fluoroscopy with pt, and that this machine will record his swallowing  with food textures and liquids. Explained about dB being a logarithmic scale. SLP shaped pt's loud /a/ to average 82dB, started at 73dB. Pt read sentences and req'd SLP usual mod-max cues to use intent and purpose with effortful voice. Digital recording provided  beneficial auditory feedback of pt's productions. Homework to read out loud 30 seconds x10, and perform 10 loud /a/, BID.  12/09/23: n/a  PATIENT EDUCATION: Education details: See treatment date Person educated: Patient Education method: Explanation, Demonstration, Verbal cues, and Handouts Education comprehension: verbalized understanding, returned demonstration, verbal cues required, and needs further education  HOME EXERCISE PROGRAM: HEP for louder speech, HEP for dysphagia   GOALS: Goals reviewed with patient? Yes, generally  SHORT TERM GOALS: Target date:  01/26/24 (due to visit count)  Pt will undergo MBS if clinically indicated Baseline: Goal status: met  2.  Pt will demo 18/20 sentences with WNL volume Baseline:  Goal status: met  3.  Pt will demo WNL volume in 5 minutes conversation Baseline:  Goal status: met  4.  Pt will complete dysphagia HEP (if indicated following MBS) with rare min A Baseline:  Goal status: met   LONG TERM GOALS: Target date:  03/05/24  Pt will have improved PROM Baseline:  Goal status: INITIAL  2.  Pt will demo WNL volume in 10 minutes conversation, in 3 sessions Baseline: 01/28/24, 02/18/24 Goal status: modified  3.  Pt will complete dysphagia HEP (if indicated following MBS) with mod I in 3 sessions Baseline: 02/09/24, 02/18/24 Goal status: INITIAL  4.  Pt will demo knowledge about where to find reputable info about speech/language info with Parkinson's on the internet Baseline:  Goal status: INITIAL   ASSESSMENT:  CLINICAL IMPRESSION: Patient is a 73 y.o. M who was seen today for treatment of swallowing and/or speech and language in light of PD. See treatment date above for today's date for further details on today's session. He is showing progress on all LtGs. On date of eval he demonstrated incr'd volume and intent with short conversation today proving to be a good candidate for skilled ST. Wife states pt's volume is such  that she has to ask him to repeat more often than she had to 5-6 years ago.    OBJECTIVE IMPAIRMENTS: Objective impairments include dysarthria and dysphagia. These impairments are limiting patient from household responsibilities, ADLs/IADLs, effectively communicating at home and in community, and safety when swallowing.Factors affecting potential to achieve goals and functional outcome are none noted today. Patient will benefit from skilled SLP services to address above impairments and improve overall function.  REHAB POTENTIAL: Good  PLAN:  SLP FREQUENCY: 2x/week  SLP DURATION: 4 weeks, or 17 total sessions  PLANNED INTERVENTIONS: Aspiration precaution training, Pharyngeal strengthening exercises, Diet toleration management , Cueing hierachy, Internal/external aids, Oral motor exercises, Functional tasks, SLP instruction and feedback, Compensatory strategies, Patient/family education, [phone] Treatment of speech (30 or 45 min) , and [id number] Treatment of swallowing function    Elianne Gubser, CCC-SLP 02/18/2024, 11:06 AM

## 2024-02-18 ENCOUNTER — Ambulatory Visit

## 2024-02-18 DIAGNOSIS — R1312 Dysphagia, oropharyngeal phase: Secondary | ICD-10-CM

## 2024-02-18 DIAGNOSIS — R278 Other lack of coordination: Secondary | ICD-10-CM

## 2024-02-18 DIAGNOSIS — R471 Dysarthria and anarthria: Secondary | ICD-10-CM

## 2024-02-18 DIAGNOSIS — R29818 Other symptoms and signs involving the nervous system: Secondary | ICD-10-CM

## 2024-02-18 DIAGNOSIS — G20A1 Parkinson's disease without dyskinesia, without mention of fluctuations: Secondary | ICD-10-CM

## 2024-02-18 DIAGNOSIS — M6281 Muscle weakness (generalized): Secondary | ICD-10-CM

## 2024-02-18 NOTE — Therapy (Signed)
 OUTPATIENT OCCUPATIONAL THERAPY [facility]  Patient Name: Tyler [name] MRN: [id number] DOB:08-23-1950, 73 y.o., male Today's Date: 02/18/2024  PCP: Janey Santos REFERRING PROVIDER: Tat, Asberry RAMAN, DO  OCCUPATIONAL THERAPY DISCHARGE SUMMARY  Visits from Start of Care: Including eval pt received 15 skilled OT visits.  Current functional level related to goals / functional outcomes: Pt met 6/6 STGs and 5/5 LTGs.   Remaining deficits: Mild micrographia with prolonged writing, increased time required for   Education / Equipment: Pt educated in PD resources including community groups/exercise classes, HEPs for PD and improving/maintaining FM coordination and strength, AE for increasing ease in ADL/IADL completion.   Patient agrees to discharge. Patient goals were met. Patient is being discharged due to meeting the stated rehab goals..    END OF SESSION:   OT End of Session - 02/18/24 1020     Visit Number 15    Number of Visits 17    Date for Recertification  02/19/24    Authorization Type [employer]-VL:[place]    OT Start Time 1018    OT Stop Time 1100    OT Time Calculation (min) 42 min    Equipment Utilized During Treatment yellow putty with marbles, small and peg boards    Activity Tolerance Patient tolerated treatment well    Behavior During Therapy WFL for tasks assessed/performed                   Past Medical History:  Diagnosis Date   Adenomatous polyp of colon 02/2004   Anemia    Anxiety    Arthritis    BPH (benign prostatic hyperplasia)    Colonic varices    Degenerative joint disease    Depression    Depression    Fatty liver    GERD (gastroesophageal reflux disease)    Heart murmur    Hemorrhoids    Hyperlipidemia    Hypertension    IBS (irritable bowel syndrome)    Prostate cancer (HCC) 2011   S/P minimally invasive mitral valve repair 04/11/2020   Complex valvuloplasty including artificial Gore-tex neochord  placement x8 with suture plication of posterior commissure and 30 mm Sorin Memo 3D ring annuloplasty via right mini thoracotomy approach   Thrombocytopenia    Thyroid  nodule    Past Surgical History:  Procedure Laterality Date   BUBBLE STUDY  12/22/2019   Procedure: BUBBLE STUDY;  Surgeon: Barbaraann Darryle Ned, MD;  Location: [facility] ENDOSCOPY;  Service: Cardiovascular;;   CARDIAC CATHETERIZATION     COLONOSCOPY     MASS EXCISION Right 05/14/2021   Procedure: REPAIR RIGHT CHEST WALL HERNIA WITH MESH;  Surgeon: Kerrin Elspeth BROCKS, MD;  Location: [facility] OR;  Service: Thoracic;  Laterality: Right;   MITRAL VALVE REPAIR Right 04/11/2020   Procedure: MINIMALLY INVASIVE MITRAL VALVE REPAIR (MVR) USING MEMO 4D RING SIZE ;  Surgeon: Dusty Sudie DEL, MD;  Location: [facility] [place] OR;  Service: Open Heart Surgery;  Laterality: Right;   PROSTATECTOMY     RIGHT/LEFT HEART CATH AND CORONARY ANGIOGRAPHY N/A 12/22/2019   Procedure: RIGHT/LEFT HEART CATH AND CORONARY ANGIOGRAPHY;  Surgeon: Wonda Sharper, MD;  Location: [facility] INVASIVE CV LAB;  Service: Cardiovascular;  Laterality: N/A;   TEE WITHOUT CARDIOVERSION N/A 12/22/2019   Procedure: TRANSESOPHAGEAL ECHOCARDIOGRAM (TEE);  Surgeon: Barbaraann Darryle Ned, MD;  Location: [facility] ENDOSCOPY;  Service: Cardiovascular;  Laterality: N/A;   TEE WITHOUT CARDIOVERSION N/A 04/11/2020   Procedure: TRANSESOPHAGEAL ECHOCARDIOGRAM (TEE);  Surgeon: Dusty Sudie DEL, MD;  Location: [facility] OR;  Service: Open Heart Surgery;  Laterality: N/A;   TONSILLECTOMY     TOOTH EXTRACTION  03/07/2020   TYMPANOPLASTY  01/2016   Patient Active Problem List   Diagnosis Date Noted   Incisional hernia 05/14/2021   S/P minimally invasive mitral valve repair 04/11/2020   Mitral regurgitation 12/20/2019   VARICES OF OTHER SITES 12/30/2007   History of colonic polyps 12/25/2007    ONSET DATE: 12/09/2023 referral date; diagnosed with PD in 10/2022  REFERRING DIAG:  Diagnosis  G20.A1 (ICD-10-CM) -  Parkinson's disease without dyskinesia or fluctuating manifestations (HCC)    THERAPY DIAG:  Muscle weakness (generalized)  Other symptoms and signs involving the nervous system  Other lack of coordination  Parkinson's disease without dyskinesia or fluctuating manifestations (HCC)  Rationale for Evaluation and Treatment: Rehabilitation  SUBJECTIVE:   SUBJECTIVE STATEMENT: Pt reports that he went to a clinic yesterday to get checked out and get some medication as he has been having a cold and wanted to make sure it wasn't pneumonia.  Pt accompanied by: self  PERTINENT HISTORY: hx of mitral valve replacement, prostate cancer  PRECAUTIONS: Other: mitral valve repair with ring, fall, cancer hx, HTN  WEIGHT BEARING RESTRICTIONS: No  PAIN:  Are you having pain? No  FALLS: Has patient fallen in last 6 months? Yes. Number of falls 1, treadmill  LIVING ENVIRONMENT: Lives with: lives with their spouse Lives in: House/apartment Stairs: stairs inside/outside, does have chair lift available if needed Has following equipment at home: Wheelchair (manual), shower chair, and Grab bars  PLOF: Independent  PATIENT GOALS: I want to maintain function as long as possible. Several years ago my strength kind of went away.   OBJECTIVE:  Note: Objective measures were completed at Evaluation unless otherwise noted.  HAND DOMINANCE: Right  ADLs: Overall ADLs: Reports independence but some tasks that require FM coordination takes longer Transfers/ambulation related to ADLs: I/Mod I Eating: Independent Grooming: Independent UB Dressing: Difficulty buttoning LB Dressing: Difficulty buttoning pants Toileting: Independent Bathing:  Mod Independent Tub Shower transfers: Mod Independent Equipment: Shower seat with back, Grab bars, and Walk in shower  IADLs: Shopping: Wife usually completes even before diagnosis Light housekeeping: Completing laundry and washes dishes  Meal Prep: wife  completes cooking even before PD Community mobility: Driving Medication management: Completes Landscape architect: Independent Handwriting: 100% legible and Mild micrographia  MOBILITY STATUS: Independent and Hx of falls  POSTURE COMMENTS:  No Significant postural limitations  ACTIVITY TOLERANCE: Activity tolerance: Pt reports no changes  FUNCTIONAL OUTCOME MEASURES: Fastening/unfastening 3 buttons: 34.54 seconds Physical performance test: PPT#2 (simulated eating) 17.50 seconds & PPT#4 (donning/doffing jacket): 23.04 seconds    01/19/24   COORDINATION: 9 Hole Peg test: Right: 40.30 sec; Left: 38.43 sec Box and Blocks:  Right 32 blocks, Left 38 blocks (Did at first bring R hand around standing piece but corrected with verbal cue)  UE ROM:  WFL  UE MMT:   4/5 grossly  SENSATION:  WFL; however reports occasional numbness in thenar eminence of R thumb, reports doctor said it's most likely d/t overuse. Was a engineer, civil (consulting) for work.  MUSCLE TONE: RUE: Within functional limits and LUE: Within functional limits  COGNITION: Overall cognitive status: Within functional limits for tasks assessed  OBSERVATIONS: decreased strength, decreased FM coordination especially in R dominant hand, difficulty with ADLs especially buttoning/zipping up jacket, pt reports micrographia when writing longer sentences as well  TREATMENT DATE:  02/18/24 - Self-care/home management completed for duration as noted below including:  Discussed with pt discharge, and pt in agreement and verbalized understanding of typical routine with PD screens, and how to obtain referral should he require therapy sooner.  Assessed remaining goals, including PSFS and handwriting. - Therapeutic activities completed for duration as noted below including:  With RUE and LUE and use of yellow putty, patient  completed:  search removing 5 marbles to promote improved sensation, strengthening, and fine motor coordination of affected extremity.   With RUE gathered small pegs and recreated pattern on pegboard to promote improved alternating and FM coordination.  02/16/24 Discussed upcoming end of cert period and typical routine with PD screens and/or evaluations after d/c.  Encouraged pt to continue with exercise schedule and routine.   Handwriting: pt making an check list with focus on handwriting sizing and legibility while also having a new check list to keep up with routine of large movement exercises every other day and coordination/PWR hands that he completes daily.   Large amplitude: engaged in reaching outside BOS and across midline in standing while reaching towards vertical targets.  OT providing supervision and cues for amplitude during reaching and weight shifting.   Bag exercises: Simulated ADLs for the following activities (using a plastic bag/scarf): donning shirt, drying back, pulling down shirt, pulling up socks and donning pants.  OT challenging pt to review each exercise to ensure that he is completing each appropriately.  Pt utilizing handout for sequencing.  OT providing additional cues and highlighting key words on handout to increase recall.  Pt completing 5 of each to ensure proper technique.      02/11/24 Self care training completed for duration below including: Informed pt of upcoming re-cert and what to expect, pt verbalized understanding.  Pt reports actively completing PWR Hands and coordination activities, reports not always completing putty or theraband HEPs. Assisted pt with de-cluttering memory notebook and scheduling when to complete issued OT HEPs for improved participation. Pt verbalized understanding of schedule.   Therapeutic exercise completed for duration below including: Removed 5 small items from red theraputty with B hands. Next completed 20 gross grasp B hands  and 10 reps tip to tip pinch and pull of putty.  Therapeutic activities completed for duration below including:  Knotting, lacing, unknotting, and unlacing of plastic beads x 10 for fine motor coordination and incorporation of BUE into completion of functional tasks.  Completing tip to tip pinch of R hand picking up small shapes and recreating picture with shapes.    PATIENT EDUCATION: Education details: coordination, large amplitude hands, PD resources Person educated: Patient Education method: Explanation, Demonstration, and Handouts Education comprehension: verbalized understanding, returned demonstration, and verbal cues required  HOME EXERCISE PROGRAM: 12/31/23 - Seated Shoulder Flexion with Self-Anchored Resistance  - 1 x daily - 4 x weekly - 3 sets - 10 reps - Seated Shoulder Horizontal Abduction with Resistance  - 1 x daily - 4 x weekly - 3 sets - 10 reps - Seated Elbow Flexion with Self-Anchored Resistance  - 1 x daily - 4 x weekly - 3 sets - 10 reps - Seated Elbow Extension with Self-Anchored Resistance  - 1 x daily - 4 x weekly - 3 sets - 10 reps  Access Code: [email or web address] URL: [email or web address] Date: 01/19/2024 Prepared by: [facility] - [facility]  Exercises - Seated Gripping Towel  - 1 sets - 10 reps - Putty Squeezes  - 1  sets - 10 reps - Finger Pinch and Pull with Putty  - 1 sets - 10 reps - Wrist Flexion and Extension with Resistance Bar (putty)  - 1 sets - 10 reps  Access Code: [email or web address] URL: [email or web address] Date: 01/21/2024 Prepared by: Rocky Dutch  Exercises - Tip Pinch with Putty  - 1 x daily - 4 x weekly - 3 sets - 10 reps - 3-Point Pinch with Putty  - 1 x daily - 4 x weekly - 3 sets - 10 reps - Key Pinch with Putty  - 1 x daily - 4 x weekly - 1 sets - 10 reps - Finger Lumbricals with Putty  - 1 x daily - 4 x weekly - 3 sets - 10 reps - Removing Marbles from Putty  - 1 x daily - 4 x weekly - 1  sets  GOALS: Goals reviewed with patient? Yes  SHORT TERM GOALS: Target date: 02/18/24  Pt will verbalize understanding of adaptive strategies to increase ease with ADLS/IADLS   Baseline: New to OP OT Goal status: MET  2.  Pt will demonstrate improved ease with fastening buttons as evidenced by decreasing 3 button/unbutton time by at least 5 seconds  Baseline: 34.54 seconds 01/14/24: 25.66 seconds Goal status: GOAL MET  3.  Pt will demonstrate improved FM coordination and functional use of RUE by increasing Box and Blocks score by at least 3 blocks Baseline: Right 32 blocks, Left 38 blocks 01/14/24: 31 blocks R hand 02/02/24: 39 blocks Goal status: MET  4.  Pt will be independent with HEPs for UE strength and FM coordination with handouts for reference prn  Baseline: New to OP OT  Goal status: MET  5. Pt will write short paragraph with 75% legibility and mild to no micrographia Baseline:  Wrote short sentence ([place]) with 100% legible and Mild micrographia. Pt reports micrographia worsens the longer he writes. Goal status: MET  6. Pt will demonstrate an improved average PSFS score of at least 2 points, or individual score improvement of 3 points   Baseline:   01/19/24: 5.0  02/18/24: 7.7     Goal status: GOAL MET   LONG TERM GOALS: Target date: 02/18/24  Pt will write short paragraph with 100% legibility and mild to no micrographia Baseline:  Wrote short sentence ([place]) with 100% legible and Mild micrographia. Pt reports micrographia worsens the longer he writes. 02/18/24: 100% legibility, mild micrographia with short paragraph  Goal status: MET  2.  Pt will demonstrate improved fine motor coordination for ADLs as evidenced by decreasing 9 hole peg test score for R hand by at least 3 secs  Baseline: Right: 40.30 sec; Left: 38.43 sec 02/02/24: Right: 36.53 sec Goal status: MET  3.  Pt will demonstrate increased ease with  dressing as evidenced by decreasing PPT#4 (don/ doff jacket) to 20 secs or less  Baseline: PPT#4 (donning/doffing jacket): 23.04 seconds 01/14/24: 12.18 seconds  Goal status: GOAL MET   4.  Pt will verbalize understanding of ways to prevent future PD related complications and PD community resources. Baseline: Initiated with PWR!Moves classes Goal status: MET   5.  Pt will demonstrate improved FM coordination and functional use of RUE by increasing Box and Blocks score by at least 6 blocks Baseline: Right 32 blocks, Left 38 blocks 01/14/24: 31 blocks  02/02/24: 39 blocks Goal status: MET   ASSESSMENT:  CLINICAL IMPRESSION: Patient is a 73 y.o. male who was  seen today for occupational therapy tx for PD management. Patient is appropriate for discharge and no longer demonstrates medical necessity for continued skilled occupational therapy services at this time. D/t nature of PD diagnosis would benefit from screening approximately 6-8 months from today's discharge.      Rocky Dutch, OTR/L 02/18/2024, 10:45 AM  [facility] [address] [place], [address] [place], [place] [address] Phone # [phone] Fax # [phone]

## 2024-02-20 ENCOUNTER — Ambulatory Visit

## 2024-02-23 ENCOUNTER — Ambulatory Visit

## 2024-02-23 DIAGNOSIS — R1312 Dysphagia, oropharyngeal phase: Secondary | ICD-10-CM

## 2024-02-23 DIAGNOSIS — R471 Dysarthria and anarthria: Secondary | ICD-10-CM

## 2024-02-23 NOTE — Therapy (Signed)
 " OUTPATIENT SPEECH LANGUAGE PATHOLOGY [facility]   Patient Name: Tyler [name] MRN: [id number] DOB:Sep 18, 1950, 73 y.o., male Today's Date: 02/23/2024  PCP: Janey Santos, MD REFERRING PROVIDER: Evonnie Stabs, DO  END OF SESSION:  End of Session - 02/23/24 0935     Visit Number 14    Number of Visits 17    Date for Recertification  02/27/24    SLP Start Time 0857    SLP Stop Time  0927    SLP Time Calculation (min) 30 min    Activity Tolerance Patient tolerated treatment well                 Past Medical History:  Diagnosis Date   Adenomatous polyp of colon 02/2004   Anemia    Anxiety    Arthritis    BPH (benign prostatic hyperplasia)    Colonic varices    Degenerative joint disease    Depression    Depression    Fatty liver    GERD (gastroesophageal reflux disease)    Heart murmur    Hemorrhoids    Hyperlipidemia    Hypertension    IBS (irritable bowel syndrome)    Prostate cancer (HCC) 2011   S/P minimally invasive mitral valve repair 04/11/2020   Complex valvuloplasty including artificial Gore-tex neochord placement x8 with suture plication of posterior commissure and 30 mm Sorin Memo 3D ring annuloplasty via right mini thoracotomy approach   Thrombocytopenia    Thyroid  nodule    Past Surgical History:  Procedure Laterality Date   BUBBLE STUDY  12/22/2019   Procedure: BUBBLE STUDY;  Surgeon: Barbaraann Darryle Ned, MD;  Location: [facility] ENDOSCOPY;  Service: Cardiovascular;;   CARDIAC CATHETERIZATION     COLONOSCOPY     MASS EXCISION Right 05/14/2021   Procedure: REPAIR RIGHT CHEST WALL HERNIA WITH MESH;  Surgeon: Kerrin Elspeth BROCKS, MD;  Location: [facility] OR;  Service: Thoracic;  Laterality: Right;   MITRAL VALVE REPAIR Right 04/11/2020   Procedure: MINIMALLY INVASIVE MITRAL VALVE REPAIR (MVR) USING MEMO 4D RING SIZE ;  Surgeon: Dusty Sudie DEL, MD;  Location: [facility] OR;  Service: Open Heart Surgery;  Laterality: Right;   PROSTATECTOMY      RIGHT/LEFT HEART CATH AND CORONARY ANGIOGRAPHY N/A 12/22/2019   Procedure: RIGHT/LEFT HEART CATH AND CORONARY ANGIOGRAPHY;  Surgeon: Wonda Sharper, MD;  Location: [facility] INVASIVE CV LAB;  Service: Cardiovascular;  Laterality: N/A;   TEE WITHOUT CARDIOVERSION N/A 12/22/2019   Procedure: TRANSESOPHAGEAL ECHOCARDIOGRAM (TEE);  Surgeon: Barbaraann Darryle Ned, MD;  Location: [facility] ENDOSCOPY;  Service: Cardiovascular;  Laterality: N/A;   TEE WITHOUT CARDIOVERSION N/A 04/11/2020   Procedure: TRANSESOPHAGEAL ECHOCARDIOGRAM (TEE);  Surgeon: Dusty Sudie DEL, MD;  Location: [facility] OR;  Service: Open Heart Surgery;  Laterality: N/A;   TONSILLECTOMY     TOOTH EXTRACTION  03/07/2020   TYMPANOPLASTY  01/2016   Patient Active Problem List   Diagnosis Date Noted   Incisional hernia 05/14/2021   S/P minimally invasive mitral valve repair 04/11/2020   Mitral regurgitation 12/20/2019   VARICES OF OTHER SITES 12/30/2007   History of colonic polyps 12/25/2007     ONSET DATE: diagnosed one year ago; script 11/06/23  REFERRING DIAG: Hypophonia  THERAPY DIAG:  Dysarthria and anarthria  Dysphagia, oropharyngeal phase  Rationale for Evaluation and Treatment: Rehabilitation  SUBJECTIVE:   SUBJECTIVE STATEMENT: Pt acknowledges that people are not asking him to repeat as frequently than prior to ST.   Pt accompanied by: self  PERTINENT HISTORY: Pt dx'd  with PD August 2024 with sx prior to that.  PAIN:  Are you having pain? No  FALLS: Has patient fallen in last 6 months?  See PT evaluation for details   PATIENT GOALS: Improve speech and swallowing  OBJECTIVE:  Note: Objective measures were completed at Evaluation unless otherwise noted.  MBS 12/22/23: Pt demonstrates a mild oropharyngeal dysphagia consisting of good airway protection, but poor oral efficiency and mild pharyngeal residue. Pt recommended to continue regular diet and thin liquids with f/u with OP SLP for appropriate strategies.    Pt has  inefficient oral phase with unnecessary lingual movement to gather bolus and initiate swallow as well a piecemeal transit and loss of bolus to floor of mouth and buccal cavity. When cued to do so pt can increase efficency and improve bolus cohesion to decrease oral residue and reduce number of swallows gather it and swallow it all at once. Pt also has mild pharyngeal residue with thin liquids, likely due to oral deficits and slightly decreased ROM and duration of swallow. Again with cues for an effortful swallow pt increases these measures and reduces residue. Pt did not aspirate during the study, but expect some penetration or aspiration of oral residue and pharyngeal residue pooling after bites and sips. Also noted bumpy posterior pharyngeal wall and esophageal column against cervical spine, suggesting loss of tissue mass with age/sarcopenia. Pt to f/u with OP SLP to implement strategies. Would not suggest any texture changes.   DIGEST Swallow Severity Rating*             Safety: 0             Efficiency:1             Overall Pharyngeal Swallow Severity: 1 1: mild; 2: moderate; 3: severe; 4: profound  *The Dynamic Imaging Grade of Swallowing Toxicity is standardized for the head and neck cancer population, however, demonstrates promising clinical applications across populations to standardize the clinical rating of pharyngeal swallow safety and severity.   Factors that may increase risk of adverse event in presence of aspiration Noe & Lianne 2021):     Swallow Evaluation Recommendations Recommendations: PO diet PO Diet Recommendation: Regular;Thin liquids (Level 0) Liquid Administration via: Cup;Straw Medication Administration: Whole meds with liquid Supervision: Patient able to self-feed Swallowing strategies  : effortful swallow;Avoid mixed consistencies Postural changes: Position pt fully upright for meals;Stay upright 30-60 min after meals Oral care recommendations: Oral care BID  (2x/day)  PATIENT REPORTED OUTCOME MEASURES (PROM): Communication Participation Item Bank: returned 01/12/24 and pt scored 16/30 with lower scores indicating more barriers to communication due to pt's softer speech. All answers were 1-2 (with options from 0-3).                                                                                                                            TREATMENT DATE:   02/23/24: SPEECH: pt reported to SLP that he had family gathering over the  weekend and had good conversational interactions. SLP reiterated to pt that he would need to cont to practice speaking out and lifting voice forward after d/c from ST. SLP and pt walked and talked outside and pt maintained WNL volume speech with some background noise. SWALLOW: Pt completed his HEP with independence.  Pt and SLP agreed pt can decr to once every other week. Possible d/c next session or session after.  02/18/24: SWALLOW: SLP reminded pt of goal of 25 with 30 reps ultimately the goal/day for each exercise. Pt has not completed since previous session due to illness. Today SLP assessed pt's procedure and was WNL with each exercise. SPEECH: Pt maintained WNL speech volume with mask today in 10 minutes conversation. SLP then took pt outdoors and he maintained audible volume with faint auto noise in the parking lot. Suspect pt will require 2-3 more visits.  02/09/24: Due to illness, pt without practice last week.  SPEECH: SLP assessed pt's speech in conversation.  Demonstrating mild hoarseness this morning , clearing throat throughout session. SLP mentioned some throat clearing alternatives (See pt instructions). In 19 minutes of conversation pt maintained WNL loudness.  SWALLOWING:SLP assessed pt's accuracy with his swallowing HEP. Pt was independent with these and demonstrated fatigue only with Claudette after 5 reps, Masako with 5 reps. Fatigue demonstrated with effortful after 8 reps. Calin completed limited reps/day  last week, so SLP encouraged him to attempt 25/day SLP and pt agreed pt could decr frequency to x1/week.  02/02/24: SPEECH: Pt spoke with SLP for 23 minutes with self correction for reduced speech volume once. Average volume was WNL.  SWALLOWING: SLP assessed pt's accuracy with his swallowing HEP. Pt was independent with these and demonstrated fatigue only with Claudette after 8 reps, Masako with 8 reps. Fatigue demonstrated with effortful after 8 reps; pt req'd less water for eliciting swallows than last week. Khamauri is completing 20 reps/day so SLP encouraged him to attempt 25/day this week in hopes of ultimately making it to 30/day.   01/28/24: SPEECH: SLP guided pt through practice with deliberate and mindful speech at a WNL volume. Practice in 12 minutes of conversation resulted in average 70dB until 10 minutes, when average decr'd to 68dB. Pt was unsure of the decr in volume until SLP incr'd pt's awareness. In 5 minutes conversation after this, pt maintained WNL volume. SLP told pt to pay attention to people's reactions (or not) to his WNL conversational volume. SLP reiterated that pt is beginning to feel how WNL volume speech production feels. SWALLOWING: SLP guided pt through his swallow HEP and pt with noted fatigue after rep # 6 or 7 on each exercise. SLP told pt to completed at least 5 reps at a time or reps will be insufficient for progress. Pt procedure was independent.   01/26/24: ROYLENEBETHA Marty completed HEP with rare min A for hold time for [facility]. He identified hold times that were too short x2. He is completing all exercises but maybe a little less of Mendelsohn than others, he stated. SLP encouraged pt to follow suggestions for all exercises. SPEECH: Pt entered with WNL speech volume for 5 minutes conversation. He read his HEP with WNL volume. SLP engaged pt with improving speech loudness by having him provided descriptions that would necessitate 4-5 minute descriptions (bedroom, front  yard, etc). Aviel maintained WNL volume 85-90% of the time. Pt will make concerted effort to practice louder speech more consistently and then use when talking to his wife in the mornings.  01/21/24: SPEECH: SLP guided pt through his HEP for louder more articulate speech. Pt's loud /a/ averaged 86dB, and his sentences were 78dB prior to SLP usual mod-max cues for loudness; then they increased to 82dB. Pt was told to cont with his HEP, and SLP added sentence level responses to cont to make WNL volume speech a habit. In phrase responses pt averaged 69dB which improved to 70dB with initial mod cues faded to rare min cues for loudness. SLP varied cognitive load from minimal to mod-complex. Homework to practice at sentence level 2 15-minute periods or one 20-minute period/day.   01/19/24: SWALLOW: SLP guided pt through his swallowing HEP. Benefited from visual cues of a mirror for [place]; pt agreed. Masako req'd min A faded to independent. Initial min cues for effortful and then independent. With POs, pt req'd cues for not taking a sip with a solid bolus  but just enough liquid to wet the solid bolus (no extra water in the mouth), min A faded to independent with precautions.  SPEECH: SLP guided pt through his HEP for louder more articulate speech. Pt's loud /a/ averaged 85dB, and his sentences were 81dB prior to SLP mod cues for loudness; then they increased to 83dB. Pt was told to cont with his HEP.   01/14/24: Pt needs swallow HEP done next session.  SPEECH: SLP asked Mahad to cont to look at his schedule and let SLP know how this strategy is going next visit about keeping better organized. Pt's average with loud /a/ was 87dB, and pt wrote 4 more everyday sentences; Average today with these was 79dB. In two conversation segments, one of 8 minutes, one of 9 minutes, and one of 12 minutes pt maintained WNL volume. He noted that being in the ST room, with the sound level meter made it easier to concentrate on  loudness and stated he needed to do better at home with increasing his loudness.   11/10/25BETHA Marty told SLP that he feels like he is not as organized with his days as he was previously and wonders if PD has lessened his skills with executive function. SLP suggested he sit down with his medical appointments and to-do list and spend ~5 minutes thinking through his next day. He thanked SLP for this suggestion. With loud /a/ pt's average was 87dB, and SLP assisted pt in developing everyday sentences. He was told to complete 4-5 more sentences until next session.  Average today with these was 77dB and SLP had pt incr loudness and he did, to average 79dB.   01/07/24: CPIB was not returned today. Will bring next time.  SWALLOW: Pt states the HEP for swallowing is difficult. In light of this SLP reviewed pt's HEP with him today. Pt stated his confidence in performing the HEP was better, from 8-9/10 (where 10=100% confident about procedure). SPEECH: SLP modeled loud /a/ for pt and provided rationale for loud /a/, and he generated loud /a/ average 86dB. Pt has not been successful with routine completion with these. SLP provided some examples of how to accomplish that. SLP reviewed the rationale for reading out loud. With reading sentences, pt's average was 74dB with initial min A initially. SLP progressed into paragraphs and pt loudness was 71dB with initial cues for intent, deliberate, purposeful speech. For homework, SLP told pt to read out loud for 30 seconds x10 after loud /a/.   01/05/24: CPIB provided to pt today. Discussed/reviewed MBS with pt today. Stressed gather and swallow concept for each swallow was much  like swallowing with more intent/purpose like SLP and pt had discussed about his speech in previous session. SLP assisted pt with practice with gather and swallow concept with swallows of water. Pt with more effort with swallow on the swallows he thought gather and swallow was more salient for him.  SLP then developed HEP for pt and SLP assessed pt's procedure with these exercises and corrected PRN. Pt to complete at least 5 days/week with 30 reps/day.   12/22/23: NEEDS CPIB NEXT SESSION. Explained the nature of fluoroscopy with pt, and that this machine will record his swallowing with food textures and liquids. Explained about dB being a logarithmic scale. SLP shaped pt's loud /a/ to average 82dB, started at 73dB. Pt read sentences and req'd SLP usual mod-max cues to use intent and purpose with effortful voice. Digital recording provided beneficial auditory feedback of pt's productions. Homework to read out loud 30 seconds x10, and perform 10 loud /a/, BID.  12/09/23: n/a  PATIENT EDUCATION: Education details: See treatment date Person educated: Patient Education method: Explanation, Demonstration, Verbal cues, and Handouts Education comprehension: verbalized understanding, returned demonstration, verbal cues required, and needs further education  HOME EXERCISE PROGRAM: HEP for louder speech, HEP for dysphagia   GOALS: Goals reviewed with patient? Yes, generally  SHORT TERM GOALS: Target date:  01/26/24 (due to visit count)  Pt will undergo MBS if clinically indicated Baseline: Goal status: met  2.  Pt will demo 18/20 sentences with WNL volume Baseline:  Goal status: met  3.  Pt will demo WNL volume in 5 minutes conversation Baseline:  Goal status: met  4.  Pt will complete dysphagia HEP (if indicated following MBS) with rare min A Baseline:  Goal status: met   LONG TERM GOALS: Target date:  03/05/24  Pt will have improved PROM Baseline:  Goal status: INITIAL  2.  Pt will demo WNL volume in 10 minutes conversation, in 3 sessions Baseline: 01/28/24, 02/18/24 Goal status: mry  3.  Pt will complete dysphagia HEP (if indicated following MBS) with mod I in 3 sessions Baseline: 02/09/24, 02/18/24 Goal status: met  4.  Pt will demo knowledge about where to find  reputable info about speech/language info with Parkinson's on the internet Baseline:  Goal status: INITIAL   ASSESSMENT:  CLINICAL IMPRESSION: Patient is a 73 y.o. M who was seen today for treatment of swallowing and/or speech and language in light of PD. See treatment date above for today's date for further details on today's session. He continues to show progress on all LTGs - d/c likely in 1/2 visits. On date of eval he demonstrated incr'd volume and intent with short conversation today proving to be a good candidate for skilled ST. Wife states pt's volume is such that she has to ask him to repeat more often than she had to 5-6 years ago.    OBJECTIVE IMPAIRMENTS: Objective impairments include dysarthria and dysphagia. These impairments are limiting patient from household responsibilities, ADLs/IADLs, effectively communicating at home and in community, and safety when swallowing.Factors affecting potential to achieve goals and functional outcome are none noted today. Patient will benefit from skilled SLP services to address above impairments and improve overall function.  REHAB POTENTIAL: Good  PLAN:  SLP FREQUENCY: 2x/week  SLP DURATION: 4 weeks, or 17 total sessions  PLANNED INTERVENTIONS: Aspiration precaution training, Pharyngeal strengthening exercises, Diet toleration management , Cueing hierachy, Internal/external aids, Oral motor exercises, Functional tasks, SLP instruction and feedback, Compensatory strategies, Patient/family education, [phone] Treatment of speech (  30 or 45 min) , and [id number] Treatment of swallowing function    Antonia Jicha, CCC-SLP 02/23/2024, 9:35 AM      "

## 2024-03-09 ENCOUNTER — Ambulatory Visit

## 2024-03-10 ENCOUNTER — Ambulatory Visit: Attending: Neurology

## 2024-03-10 DIAGNOSIS — R1312 Dysphagia, oropharyngeal phase: Secondary | ICD-10-CM | POA: Diagnosis present

## 2024-03-10 DIAGNOSIS — R471 Dysarthria and anarthria: Secondary | ICD-10-CM | POA: Diagnosis present

## 2024-03-10 NOTE — Therapy (Signed)
 " OUTPATIENT SPEECH LANGUAGE PATHOLOGY [facility]-RECERTIFICATION   Patient Name: Tyler [name] MRN: [id number] DOB:[birth year], 74 y.o., male Today's Date: 03/10/2024  PCP: Janey Santos, MD REFERRING PROVIDER: Evonnie Stabs, DO  END OF SESSION:  End of Session - 03/10/24 2310     Visit Number 15    Number of Visits 17    Date for Recertification  04/09/24    SLP Start Time 1234    SLP Stop Time  1316    SLP Time Calculation (min) 42 min    Activity Tolerance Patient tolerated treatment well                  Past Medical History:  Diagnosis Date   Adenomatous polyp of colon 02/2004   Anemia    Anxiety    Arthritis    BPH (benign prostatic hyperplasia)    Colonic varices    Degenerative joint disease    Depression    Depression    Fatty liver    GERD (gastroesophageal reflux disease)    Heart murmur    Hemorrhoids    Hyperlipidemia    Hypertension    IBS (irritable bowel syndrome)    Prostate cancer (HCC) 2011   S/P minimally invasive mitral valve repair 04/11/2020   Complex valvuloplasty including artificial Gore-tex neochord placement x8 with suture plication of posterior commissure and 30 mm Sorin Memo 3D ring annuloplasty via right mini thoracotomy approach   Thrombocytopenia    Thyroid  nodule    Past Surgical History:  Procedure Laterality Date   BUBBLE STUDY  12/22/2019   Procedure: BUBBLE STUDY;  Surgeon: Barbaraann Darryle Ned, MD;  Location: [facility] ENDOSCOPY;  Service: Cardiovascular;;   CARDIAC CATHETERIZATION     COLONOSCOPY     MASS EXCISION Right 05/14/2021   Procedure: REPAIR RIGHT CHEST WALL HERNIA WITH MESH;  Surgeon: Kerrin Elspeth BROCKS, MD;  Location: [facility] OR;  Service: Thoracic;  Laterality: Right;   MITRAL VALVE REPAIR Right 04/11/2020   Procedure: MINIMALLY INVASIVE MITRAL VALVE REPAIR (MVR) USING MEMO 4D RING SIZE ;  Surgeon: Dusty Sudie DEL, MD;  Location: [facility] OR;  Service: Open Heart Surgery;  Laterality: Right;    PROSTATECTOMY     RIGHT/LEFT HEART CATH AND CORONARY ANGIOGRAPHY N/A 12/22/2019   Procedure: RIGHT/LEFT HEART CATH AND CORONARY ANGIOGRAPHY;  Surgeon: Wonda Sharper, MD;  Location: [facility] INVASIVE CV LAB;  Service: Cardiovascular;  Laterality: N/A;   TEE WITHOUT CARDIOVERSION N/A 12/22/2019   Procedure: TRANSESOPHAGEAL ECHOCARDIOGRAM (TEE);  Surgeon: Barbaraann Darryle Ned, MD;  Location: [facility] ENDOSCOPY;  Service: Cardiovascular;  Laterality: N/A;   TEE WITHOUT CARDIOVERSION N/A 04/11/2020   Procedure: TRANSESOPHAGEAL ECHOCARDIOGRAM (TEE);  Surgeon: Dusty Sudie DEL, MD;  Location: [facility] OR;  Service: Open Heart Surgery;  Laterality: N/A;   TONSILLECTOMY     TOOTH EXTRACTION  03/07/2020   TYMPANOPLASTY  01/2016   Patient Active Problem List   Diagnosis Date Noted   Incisional hernia 05/14/2021   S/P minimally invasive mitral valve repair 04/11/2020   Mitral regurgitation 12/20/2019   VARICES OF OTHER SITES 12/30/2007   History of colonic polyps 12/25/2007   Speech Therapy Progress Note  Dates of Reporting Period: 02/02/24 to present  Subjective Statement: Pt has been seen for 15 sessions focusing on speech and swallowing. Volume has improved as well as pt's intent with swallowing.   Objective: See below.  Goal Update: See below  Plan: One more session  Reason Skilled Services are Required: Pt would like to ensure progress  over time.    ONSET DATE: diagnosed one year ago; script 11/06/23  REFERRING DIAG: Hypophonia  THERAPY DIAG:  Dysarthria and anarthria  Dysphagia, oropharyngeal phase  Rationale for Evaluation and Treatment: Rehabilitation  SUBJECTIVE:   SUBJECTIVE STATEMENT: Pt cont to acknowledge that people are not asking him to repeat as frequently than prior to ST.   Pt accompanied by: self  PERTINENT HISTORY: Pt dx'd with PD August 2024 with sx prior to that.  PAIN:  Are you having pain? No  FALLS: Has patient fallen in last 6 months?  See PT evaluation for  details   PATIENT GOALS: Improve speech and swallowing  OBJECTIVE:  Note: Objective measures were completed at Evaluation unless otherwise noted.  MBS 12/22/23: Pt demonstrates a mild oropharyngeal dysphagia consisting of good airway protection, but poor oral efficiency and mild pharyngeal residue. Pt recommended to continue regular diet and thin liquids with f/u with OP SLP for appropriate strategies.    Pt has inefficient oral phase with unnecessary lingual movement to gather bolus and initiate swallow as well a piecemeal transit and loss of bolus to floor of mouth and buccal cavity. When cued to do so pt can increase efficency and improve bolus cohesion to decrease oral residue and reduce number of swallows gather it and swallow it all at once. Pt also has mild pharyngeal residue with thin liquids, likely due to oral deficits and slightly decreased ROM and duration of swallow. Again with cues for an effortful swallow pt increases these measures and reduces residue. Pt did not aspirate during the study, but expect some penetration or aspiration of oral residue and pharyngeal residue pooling after bites and sips. Also noted bumpy posterior pharyngeal wall and esophageal column against cervical spine, suggesting loss of tissue mass with age/sarcopenia. Pt to f/u with OP SLP to implement strategies. Would not suggest any texture changes.   DIGEST Swallow Severity Rating*             Safety: 0             Efficiency:1             Overall Pharyngeal Swallow Severity: 1 1: mild; 2: moderate; 3: severe; 4: profound  *The Dynamic Imaging Grade of Swallowing Toxicity is standardized for the head and neck cancer population, however, demonstrates promising clinical applications across populations to standardize the clinical rating of pharyngeal swallow safety and severity.   Factors that may increase risk of adverse event in presence of aspiration Noe & Lianne 2021):     Swallow Evaluation  Recommendations Recommendations: PO diet PO Diet Recommendation: Regular;Thin liquids (Level 0) Liquid Administration via: Cup;Straw Medication Administration: Whole meds with liquid Supervision: Patient able to self-feed Swallowing strategies  : effortful swallow;Avoid mixed consistencies Postural changes: Position pt fully upright for meals;Stay upright 30-60 min after meals Oral care recommendations: Oral care BID (2x/day)  PATIENT REPORTED OUTCOME MEASURES (PROM): Communication Participation Item Bank: returned 01/12/24 and pt scored 16/30 with lower scores indicating more barriers to communication due to pt's softer speech. All answers were 1-2 (with options from 0-3).  TREATMENT DATE:   03/10/24: SWALLOW: SLP guided pt through his HEP and he req'd initial cues for tongue protrusion with Masako. He was independent after this. SLP told pt to go through January with his HEP x5 days/week and then twice a week in January, indefinitely.  SPEECH: SLP guided pt through his everyday sentences which were completed with average 80dB. SLP used occasional min-mod cues for volume and intent and pt incr'd volume to average 83 dB. In conversational speech pt maintained WNL volume in 16 minutes conversation in therapy room and while pt and SLP walked outdoors for 8 minutes. Pt told SLP he would prefer one more session to ensure progress over time.   02/23/24: SPEECH: pt reported to SLP that he had family gathering over the weekend and had good conversational interactions. SLP reiterated to pt that he would need to cont to practice speaking out and lifting voice forward after d/c from ST. SLP and pt walked and talked outside and pt maintained WNL volume speech with some background noise. SWALLOW: Pt completed his HEP with independence.  Pt and SLP agreed pt can decr to once every other  week. Possible d/c next session or session after.  02/18/24: SWALLOW: SLP reminded pt of goal of 25 with 30 reps ultimately the goal/day for each exercise. Pt has not completed since previous session due to illness. Today SLP assessed pt's procedure and was WNL with each exercise. SPEECH: Pt maintained WNL speech volume with mask today in 10 minutes conversation. SLP then took pt outdoors and he maintained audible volume with faint auto noise in the parking lot. Suspect pt will require 2-3 more visits.  02/09/24: Due to illness, pt without practice last week.  SPEECH: SLP assessed pt's speech in conversation.  Demonstrating mild hoarseness this morning , clearing throat throughout session. SLP mentioned some throat clearing alternatives (See pt instructions). In 19 minutes of conversation pt maintained WNL loudness.  SWALLOWING:SLP assessed pt's accuracy with his swallowing HEP. Pt was independent with these and demonstrated fatigue only with Tyler [name] after 5 reps, Masako with 5 reps. Fatigue demonstrated with effortful after 8 reps. Tyler [name] completed limited reps/day last week, so SLP encouraged him to attempt 25/day SLP and pt agreed pt could decr frequency to x1/week.  02/02/24: SPEECH: Pt spoke with SLP for 23 minutes with self correction for reduced speech volume once. Average volume was WNL.  SWALLOWING: SLP assessed pt's accuracy with his swallowing HEP. Pt was independent with these and demonstrated fatigue only with Tyler [name] after 8 reps, Masako with 8 reps. Fatigue demonstrated with effortful after 8 reps; pt req'd less water for eliciting swallows than last week. Tyler [name] is completing 20 reps/day so SLP encouraged him to attempt 25/day this week in hopes of ultimately making it to 30/day.   01/28/24: SPEECH: SLP guided pt through practice with deliberate and mindful speech at a WNL volume. Practice in 12 minutes of conversation resulted in average 70dB until 10 minutes, when average decr'd to  68dB. Pt was unsure of the decr in volume until SLP incr'd pt's awareness. In 5 minutes conversation after this, pt maintained WNL volume. SLP told pt to pay attention to people's reactions (or not) to his WNL conversational volume. SLP reiterated that pt is beginning to feel how WNL volume speech production feels. SWALLOWING: SLP guided pt through his swallow HEP and pt with noted fatigue after rep # 6 or 7 on each exercise. SLP told pt to completed at least 5 reps at a time  or reps will be insufficient for progress. Pt procedure was independent.   01/26/24: Tyler [name] completed HEP with rare min A for hold time for [facility]. He identified hold times that were too short x2. He is completing all exercises but maybe a little less of [place] than others, he stated. SLP encouraged pt to follow suggestions for all exercises. SPEECH: Pt entered with WNL speech volume for 5 minutes conversation. He read his HEP with WNL volume. SLP engaged pt with improving speech loudness by having him provided descriptions that would necessitate 4-5 minute descriptions (bedroom, front yard, etc). Tyler [name] maintained WNL volume 85-90% of the time. Pt will make concerted effort to practice louder speech more consistently and then use when talking to his wife in the mornings.   01/21/24: SPEECH: SLP guided pt through his HEP for louder more articulate speech. Pt's loud /a/ averaged 86dB, and his sentences were 78dB prior to SLP usual mod-max cues for loudness; then they increased to 82dB. Pt was told to cont with his HEP, and SLP added sentence level responses to cont to make WNL volume speech a habit. In phrase responses pt averaged 69dB which improved to 70dB with initial mod cues faded to rare min cues for loudness. SLP varied cognitive load from minimal to mod-complex. Homework to practice at sentence level 2 15-minute periods or one 20-minute period/day.   01/19/24: SWALLOW: SLP guided pt through his swallowing HEP.  Benefited from visual cues of a mirror for [place]; pt agreed. Masako req'd min A faded to independent. Initial min cues for effortful and then independent. With POs, pt req'd cues for not taking a sip with a solid bolus  but just enough liquid to wet the solid bolus (no extra water in the mouth), min A faded to independent with precautions.  SPEECH: SLP guided pt through his HEP for louder more articulate speech. Pt's loud /a/ averaged 85dB, and his sentences were 81dB prior to SLP mod cues for loudness; then they increased to 83dB. Pt was told to cont with his HEP.   01/14/24: Pt needs swallow HEP done next session.  SPEECH: SLP asked Tyler [name] to cont to look at his schedule and let SLP know how this strategy is going next visit about keeping better organized. Pt's average with loud /a/ was 87dB, and pt wrote 4 more everyday sentences; Average today with these was 79dB. In two conversation segments, one of 8 minutes, one of 9 minutes, and one of 12 minutes pt maintained WNL volume. He noted that being in the ST room, with the sound level meter made it easier to concentrate on loudness and stated he needed to do better at home with increasing his loudness.   11/10/25BETHA [name] told SLP that he feels like he is not as organized with his days as he was previously and wonders if PD has lessened his skills with executive function. SLP suggested he sit down with his medical appointments and to-do list and spend ~5 minutes thinking through his next day. He thanked SLP for this suggestion. With loud /a/ pt's average was 87dB, and SLP assisted pt in developing everyday sentences. He was told to complete 4-5 more sentences until next session.  Average today with these was 77dB and SLP had pt incr loudness and he did, to average 79dB.   01/07/24: CPIB was not returned today. Will bring next time.  SWALLOW: Pt states the HEP for swallowing is difficult. In light of this SLP reviewed pt's HEP with him  today. Pt stated  his confidence in performing the HEP was better, from 8-9/10 (where 10=100% confident about procedure). SPEECH: SLP modeled loud /a/ for pt and provided rationale for loud /a/, and he generated loud /a/ average 86dB. Pt has not been successful with routine completion with these. SLP provided some examples of how to accomplish that. SLP reviewed the rationale for reading out loud. With reading sentences, pt's average was 74dB with initial min A initially. SLP progressed into paragraphs and pt loudness was 71dB with initial cues for intent, deliberate, purposeful speech. For homework, SLP told pt to read out loud for 30 seconds x10 after loud /a/.   01/05/24: CPIB provided to pt today. Discussed/reviewed MBS with pt today. Stressed gather and swallow concept for each swallow was much like swallowing with more intent/purpose like SLP and pt had discussed about his speech in previous session. SLP assisted pt with practice with gather and swallow concept with swallows of water. Pt with more effort with swallow on the swallows he thought gather and swallow was more salient for him. SLP then developed HEP for pt and SLP assessed pt's procedure with these exercises and corrected PRN. Pt to complete at least 5 days/week with 30 reps/day.   12/22/23: NEEDS CPIB NEXT SESSION. Explained the nature of fluoroscopy with pt, and that this machine will record his swallowing with food textures and liquids. Explained about dB being a logarithmic scale. SLP shaped pt's loud /a/ to average 82dB, started at 73dB. Pt read sentences and req'd SLP usual mod-max cues to use intent and purpose with effortful voice. Digital recording provided beneficial auditory feedback of pt's productions. Homework to read out loud 30 seconds x10, and perform 10 loud /a/, BID.  12/09/23: n/a  PATIENT EDUCATION: Education details: See treatment date Person educated: Patient Education method: Explanation, Demonstration, Verbal cues, and  Handouts Education comprehension: verbalized understanding, returned demonstration, verbal cues required, and needs further education  HOME EXERCISE PROGRAM: HEP for louder speech, HEP for dysphagia   GOALS: Goals reviewed with patient? Yes, generally  SHORT TERM GOALS: Target date:  01/26/24 (due to visit count)  Pt will undergo MBS if clinically indicated Baseline: Goal status: met  2.  Pt will demo 18/20 sentences with WNL volume Baseline:  Goal status: met  3.  Pt will demo WNL volume in 5 minutes conversation Baseline:  Goal status: met  4.  Pt will complete dysphagia HEP (if indicated following MBS) with rare min A Baseline:  Goal status: met   LONG TERM GOALS: Target date:  03/05/24  Pt will have improved PROM Baseline:  Goal status: INITIAL  2.  Pt will demo WNL volume in 10 minutes conversation, in 3 sessions Baseline: 01/28/24, 02/18/24 Goal status: met  3.  Pt will complete dysphagia HEP (if indicated following MBS) with mod I in 3 sessions Baseline: 02/09/24, 02/18/24 Goal status: met  4.  Pt will demo knowledge about where to find reputable info about speech/language info with Parkinson's on the internet Baseline:  Goal status: met   ASSESSMENT:  CLINICAL IMPRESSION: RECERT TODAY. Patient is a 74 y.o. M who was seen today for treatment of swallowing and/or speech and language in light of PD. See treatment date above for today's date for further details on today's session. He continues to show progress on all LTGs - d/c next visit. On date of eval he demonstrated incr'd volume and intent with short conversation today proving to be a good candidate for skilled ST. Wife  states pt's volume is such that she has to ask him to repeat more often than she had to 5-6 years ago.    OBJECTIVE IMPAIRMENTS: Objective impairments include dysarthria and dysphagia. These impairments are limiting patient from household responsibilities, ADLs/IADLs, effectively  communicating at home and in community, and safety when swallowing.Factors affecting potential to achieve goals and functional outcome are none noted today. Patient will benefit from skilled SLP services to address above impairments and improve overall function.  REHAB POTENTIAL: Good  PLAN:  SLP FREQUENCY: 1x/week  SLP DURATION: 4 weeks  PLANNED INTERVENTIONS: Aspiration precaution training, Pharyngeal strengthening exercises, Diet toleration management , Cueing hierachy, Internal/external aids, Oral motor exercises, Functional tasks, SLP instruction and feedback, Compensatory strategies, Patient/family education, [phone] Treatment of speech (30 or 45 min) , and [id number] Treatment of swallowing function    Tyler [name], CCC-SLP 03/10/2024, 11:11 PM      "

## 2024-03-17 ENCOUNTER — Ambulatory Visit

## 2024-03-24 ENCOUNTER — Ambulatory Visit

## 2024-03-24 DIAGNOSIS — R1312 Dysphagia, oropharyngeal phase: Secondary | ICD-10-CM

## 2024-03-24 DIAGNOSIS — R471 Dysarthria and anarthria: Secondary | ICD-10-CM

## 2024-03-24 NOTE — Therapy (Unsigned)
 " OUTPATIENT SPEECH LANGUAGE PATHOLOGY [facility] T[facility]-RECERTIFICATION-DISCHARGE   Patient Name: Tyler [name] MRN: [id number] DOB:18-Nov-1950, 74 y.o., male Today's Date: 03/25/2024  PCP: Janey Santos, MD REFERRING PROVIDER: Evonnie Stabs, DO  END OF SESSION:  End of Session - 03/24/24 1051     Visit Number 16    Number of Visits 17    Date for Recertification  04/09/24    SLP Start Time 1049    SLP Stop Time  1135    SLP Time Calculation (min) 46 min    Activity Tolerance Patient tolerated treatment well                  Past Medical History:  Diagnosis Date   Adenomatous polyp of colon 02/2004   Anemia    Anxiety    Arthritis    BPH (benign prostatic hyperplasia)    Colonic varices    Degenerative joint disease    Depression    Depression    Fatty liver    GERD (gastroesophageal reflux disease)    Heart murmur    Hemorrhoids    Hyperlipidemia    Hypertension    IBS (irritable bowel syndrome)    Prostate cancer (HCC) 2011   S/P minimally invasive mitral valve repair 04/11/2020   Complex valvuloplasty including artificial Gore-tex neochord placement x8 with suture plication of posterior commissure and 30 mm Sorin Memo 3D ring annuloplasty via right mini thoracotomy approach   Thrombocytopenia    Thyroid  nodule    Past Surgical History:  Procedure Laterality Date   BUBBLE STUDY  12/22/2019   Procedure: BUBBLE STUDY;  Surgeon: Barbaraann Darryle Ned, MD;  Location: [facility] ENDOSCOPY;  Service: Cardiovascular;;   CARDIAC CATHETERIZATION     COLONOSCOPY     MASS EXCISION Right 05/14/2021   Procedure: REPAIR RIGHT CHEST WALL HERNIA WITH MESH;  Surgeon: Kerrin Elspeth BROCKS, MD;  Location: [facility] OR;  Service: Thoracic;  Laterality: Right;   MITRAL VALVE REPAIR Right 04/11/2020   Procedure: MINIMALLY INVASIVE MITRAL VALVE REPAIR (MVR) USING MEMO 4D RING SIZE ;  Surgeon: Dusty Sudie DEL, MD;  Location: [facility] OR;  Service: Open Heart Surgery;  Laterality: Right;    PROSTATECTOMY     RIGHT/LEFT HEART CATH AND CORONARY ANGIOGRAPHY N/A 12/22/2019   Procedure: RIGHT/LEFT HEART CATH AND CORONARY ANGIOGRAPHY;  Surgeon: Wonda Sharper, MD;  Location: [facility] INVASIVE CV LAB;  Service: Cardiovascular;  Laterality: N/A;   TEE WITHOUT CARDIOVERSION N/A 12/22/2019   Procedure: TRANSESOPHAGEAL ECHOCARDIOGRAM (TEE);  Surgeon: Barbaraann Darryle Ned, MD;  Location: [facility] ENDOSCOPY;  Service: Cardiovascular;  Laterality: N/A;   TEE WITHOUT CARDIOVERSION N/A 04/11/2020   Procedure: TRANSESOPHAGEAL ECHOCARDIOGRAM (TEE);  Surgeon: Dusty Sudie DEL, MD;  Location: [facility] OR;  Service: Open Heart Surgery;  Laterality: N/A;   TONSILLECTOMY     TOOTH EXTRACTION  03/07/2020   TYMPANOPLASTY  01/2016   Patient Active Problem List   Diagnosis Date Noted   Incisional hernia 05/14/2021   S/P minimally invasive mitral valve repair 04/11/2020   Mitral regurgitation 12/20/2019   VARICES OF OTHER SITES 12/30/2007   History of colonic polyps 12/25/2007   SPEECH THERAPY DISCHARGE SUMMARY  Visits from Start of Care: 16  Current functional level related to goals / functional outcomes: Pt's voice has improved - see below, his swallowing has also improved. He needs to do better with consistency with swallowing exercises.    Remaining deficits: None noted   Education / Equipment: See individual sessions notes.    Patient agrees  to discharge. Patient goals were met. Patient is being discharged due to meeting the stated rehab goals..      ONSET DATE: diagnosed one year ago; script 11/06/23  REFERRING DIAG: Hypophonia  THERAPY DIAG:  Dysarthria and anarthria  Dysphagia, oropharyngeal phase  Rationale for Evaluation and Treatment: Rehabilitation  SUBJECTIVE:   SUBJECTIVE STATEMENT: I've found out I need to get up a little earlier and do the swallowing exercises.   Pt accompanied by: self  PERTINENT HISTORY: Pt dx'd with PD August 2024 with sx prior to that.  PAIN:  Are you  having pain? No  FALLS: Has patient fallen in last 6 months?  See PT evaluation for details   PATIENT GOALS: Improve speech and swallowing  OBJECTIVE:  Note: Objective measures were completed at Evaluation unless otherwise noted.  MBS 12/22/23: Pt demonstrates a mild oropharyngeal dysphagia consisting of good airway protection, but poor oral efficiency and mild pharyngeal residue. Pt recommended to continue regular diet and thin liquids with f/u with OP SLP for appropriate strategies.    Pt has inefficient oral phase with unnecessary lingual movement to gather bolus and initiate swallow as well a piecemeal transit and loss of bolus to floor of mouth and buccal cavity. When cued to do so pt can increase efficency and improve bolus cohesion to decrease oral residue and reduce number of swallows gather it and swallow it all at once. Pt also has mild pharyngeal residue with thin liquids, likely due to oral deficits and slightly decreased ROM and duration of swallow. Again with cues for an effortful swallow pt increases these measures and reduces residue. Pt did not aspirate during the study, but expect some penetration or aspiration of oral residue and pharyngeal residue pooling after bites and sips. Also noted bumpy posterior pharyngeal wall and esophageal column against cervical spine, suggesting loss of tissue mass with age/sarcopenia. Pt to f/u with OP SLP to implement strategies. Would not suggest any texture changes.   DIGEST Swallow Severity Rating*             Safety: 0             Efficiency:1             Overall Pharyngeal Swallow Severity: 1 1: mild; 2: moderate; 3: severe; 4: profound  *The Dynamic Imaging Grade of Swallowing Toxicity is standardized for the head and neck cancer population, however, demonstrates promising clinical applications across populations to standardize the clinical rating of pharyngeal swallow safety and severity.   Factors that may increase risk of adverse  event in presence of aspiration Noe & Lianne 2021):     Swallow Evaluation Recommendations Recommendations: PO diet PO Diet Recommendation: Regular;Thin liquids (Level 0) Liquid Administration via: Cup;Straw Medication Administration: Whole meds with liquid Supervision: Patient able to self-feed Swallowing strategies  : effortful swallow;Avoid mixed consistencies Postural changes: Position pt fully upright for meals;Stay upright 30-60 min after meals Oral care recommendations: Oral care BID (2x/day)  PATIENT REPORTED OUTCOME MEASURES (PROM): Communication Participation Item Bank: returned 01/12/24 and pt scored 16/30 with lower scores indicating more barriers to communication due to pt's softer speech. All answers were 1-2 (with options from 0-3).  TREATMENT DATE:   03/24/24: SWALLOW: SLP assessed p's accuracy with HEP and he was independent. Slp reiterated pt needs to complete HEP for 8 weeks straight x5 days/week and then x2/week after that. SLP also made sure pt understood gather and swallow! compensation with POs. Pt demonstrated understanding. SPEECH: Pt PROM improved from initial administration. Loud /a/ completed with 85dB average and everyday sentences which were completed with average 83B. SLP used rare min cues for volume and intent with sentences. In 23 minute conversation pt maintained WNL volume in therapy room   03/10/24: SWALLOW: SLP guided pt through his HEP and he req'd initial cues for tongue protrusion with Masako. He was independent after this. SLP told pt to go through January with his HEP x5 days/week and then twice a week in January, indefinitely.  SPEECH: SLP guided pt through his everyday sentences which were completed with average 80dB. SLP used occasional min-mod cues for volume and intent and pt incr'd volume to average 83 dB. In  conversational speech pt maintained WNL volume in 16 minutes conversation in therapy room and while pt and SLP walked outdoors for 8 minutes. Pt told SLP he would prefer one more session to ensure progress over time.   02/23/24: SPEECH: pt reported to SLP that he had family gathering over the weekend and had good conversational interactions. SLP reiterated to pt that he would need to cont to practice speaking out and lifting voice forward after d/c from ST. SLP and pt walked and talked outside and pt maintained WNL volume speech with some background noise. SWALLOW: Pt completed his HEP with independence.  Pt and SLP agreed pt can decr to once every other week. Possible d/c next session or session after.  02/18/24: SWALLOW: SLP reminded pt of goal of 25 with 30 reps ultimately the goal/day for each exercise. Pt has not completed since previous session due to illness. Today SLP assessed pt's procedure and was WNL with each exercise. SPEECH: Pt maintained WNL speech volume with mask today in 10 minutes conversation. SLP then took pt outdoors and he maintained audible volume with faint auto noise in the parking lot. Suspect pt will require 2-3 more visits.  02/09/24: Due to illness, pt without practice last week.  SPEECH: SLP assessed pt's speech in conversation.  Demonstrating mild hoarseness this morning , clearing throat throughout session. SLP mentioned some throat clearing alternatives (See pt instructions). In 19 minutes of conversation pt maintained WNL loudness.  SWALLOWING:SLP assessed pt's accuracy with his swallowing HEP. Pt was independent with these and demonstrated fatigue only with Claudette after 5 reps, Masako with 5 reps. Fatigue demonstrated with effortful after 8 reps. Ean completed limited reps/day last week, so SLP encouraged him to attempt 25/day SLP and pt agreed pt could decr frequency to x1/week.  02/02/24: SPEECH: Pt spoke with SLP for 23 minutes with self correction for reduced  speech volume once. Average volume was WNL.  SWALLOWING: SLP assessed pt's accuracy with his swallowing HEP. Pt was independent with these and demonstrated fatigue only with Claudette after 8 reps, Masako with 8 reps. Fatigue demonstrated with effortful after 8 reps; pt req'd less water for eliciting swallows than last week. Magdaleno is completing 20 reps/day so SLP encouraged him to attempt 25/day this week in hopes of ultimately making it to 30/day.   01/28/24: SPEECH: SLP guided pt through practice with deliberate and mindful speech at a WNL volume. Practice in 12 minutes of conversation resulted in average 70dB until 10 minutes, when  average decr'd to 68dB. Pt was unsure of the decr in volume until SLP incr'd pt's awareness. In 5 minutes conversation after this, pt maintained WNL volume. SLP told pt to pay attention to people's reactions (or not) to his WNL conversational volume. SLP reiterated that pt is beginning to feel how WNL volume speech production feels. SWALLOWING: SLP guided pt through his swallow HEP and pt with noted fatigue after rep # 6 or 7 on each exercise. SLP told pt to completed at least 5 reps at a time or reps will be insufficient for progress. Pt procedure was independent.   01/26/24: ROYLENEBETHA Leisure completed HEP with rare min A for hold time for [facility]. He identified hold times that were too short x2. He is completing all exercises but maybe a little less of [place]hn than others, he stated. SLP encouraged pt to follow suggestions for all exercises. SPEECH: Pt entered with WNL speech volume for 5 minutes conversation. He read his HEP with WNL volume. SLP engaged pt with improving speech loudness by having him provided descriptions that would necessitate 4-5 minute descriptions (bedroom, front yard, etc). Baker maintained WNL volume 85-90% of the time. Pt will make concerted effort to practice louder speech more consistently and then use when talking to his wife in the mornings.    01/21/24: SPEECH: SLP guided pt through his HEP for louder more articulate speech. Pt's loud /a/ averaged 86dB, and his sentences were 78dB prior to SLP usual mod-max cues for loudness; then they increased to 82dB. Pt was told to cont with his HEP, and SLP added sentence level responses to cont to make WNL volume speech a habit. In phrase responses pt averaged 69dB which improved to 70dB with initial mod cues faded to rare min cues for loudness. SLP varied cognitive load from minimal to mod-complex. Homework to practice at sentence level 2 15-minute periods or one 20-minute period/day.   01/19/24: SWALLOW: SLP guided pt through his swallowing HEP. Benefited from visual cues of a mirror for [place]; pt agreed. Masako req'd min A faded to independent. Initial min cues for effortful and then independent. With POs, pt req'd cues for not taking a sip with a solid bolus  but just enough liquid to wet the solid bolus (no extra water in the mouth), min A faded to independent with precautions.  SPEECH: SLP guided pt through his HEP for louder more articulate speech. Pt's loud /a/ averaged 85dB, and his sentences were 81dB prior to SLP mod cues for loudness; then they increased to 83dB. Pt was told to cont with his HEP.   01/14/24: Pt needs swallow HEP done next session.  SPEECH: SLP asked Keylen to cont to look at his schedule and let SLP know how this strategy is going next visit about keeping better organized. Pt's average with loud /a/ was 87dB, and pt wrote 4 more everyday sentences; Average today with these was 79dB. In two conversation segments, one of 8 minutes, one of 9 minutes, and one of 12 minutes pt maintained WNL volume. He noted that being in the ST room, with the sound level meter made it easier to concentrate on loudness and stated he needed to do better at home with increasing his loudness.   11/10/25BETHA Leisure told SLP that he feels like he is not as organized with his days as he was previously  and wonders if PD has lessened his skills with executive function. SLP suggested he sit down with his medical appointments and to-do list  and spend ~5 minutes thinking through his next day. He thanked SLP for this suggestion. With loud /a/ pt's average was 87dB, and SLP assisted pt in developing everyday sentences. He was told to complete 4-5 more sentences until next session.  Average today with these was 77dB and SLP had pt incr loudness and he did, to average 79dB.   01/07/24: CPIB was not returned today. Will bring next time.  SWALLOW: Pt states the HEP for swallowing is difficult. In light of this SLP reviewed pt's HEP with him today. Pt stated his confidence in performing the HEP was better, from 8-9/10 (where 10=100% confident about procedure). SPEECH: SLP modeled loud /a/ for pt and provided rationale for loud /a/, and he generated loud /a/ average 86dB. Pt has not been successful with routine completion with these. SLP provided some examples of how to accomplish that. SLP reviewed the rationale for reading out loud. With reading sentences, pt's average was 74dB with initial min A initially. SLP progressed into paragraphs and pt loudness was 71dB with initial cues for intent, deliberate, purposeful speech. For homework, SLP told pt to read out loud for 30 seconds x10 after loud /a/.   01/05/24: CPIB provided to pt today. Discussed/reviewed MBS with pt today. Stressed gather and swallow concept for each swallow was much like swallowing with more intent/purpose like SLP and pt had discussed about his speech in previous session. SLP assisted pt with practice with gather and swallow concept with swallows of water. Pt with more effort with swallow on the swallows he thought gather and swallow was more salient for him. SLP then developed HEP for pt and SLP assessed pt's procedure with these exercises and corrected PRN. Pt to complete at least 5 days/week with 30 reps/day.   12/22/23: NEEDS CPIB NEXT  SESSION. Explained the nature of fluoroscopy with pt, and that this machine will record his swallowing with food textures and liquids. Explained about dB being a logarithmic scale. SLP shaped pt's loud /a/ to average 82dB, started at 73dB. Pt read sentences and req'd SLP usual mod-max cues to use intent and purpose with effortful voice. Digital recording provided beneficial auditory feedback of pt's productions. Homework to read out loud 30 seconds x10, and perform 10 loud /a/, BID.  12/09/23: n/a  PATIENT EDUCATION: Education details: See treatment date Person educated: Patient Education method: Explanation, Demonstration, Verbal cues, and Handouts Education comprehension: verbalized understanding, returned demonstration, verbal cues required, and needs further education  HOME EXERCISE PROGRAM: HEP for louder speech, HEP for dysphagia   GOALS: Goals reviewed with patient? Yes, generally  SHORT TERM GOALS: Target date:  01/26/24 (due to visit count)  Pt will undergo MBS if clinically indicated Baseline: Goal status: met  2.  Pt will demo 18/20 sentences with WNL volume Baseline:  Goal status: met  3.  Pt will demo WNL volume in 5 minutes conversation Baseline:  Goal status: met  4.  Pt will complete dysphagia HEP (if indicated following MBS) with rare min A Baseline:  Goal status: met   LONG TERM GOALS: Target date:   04/09/24  Pt will have improved PROM Baseline:  Goal status: met  2.  Pt will demo WNL volume in 10 minutes conversation, in 3 sessions Baseline: 01/28/24, 02/18/24 Goal status: met  3.  Pt will complete dysphagia HEP (if indicated following MBS) with mod I in 3 sessions Baseline: 02/09/24, 02/18/24 Goal status: met  4.  Pt will demo knowledge about where to find reputable info  about speech/language info with Parkinson's on the internet Baseline:  Goal status: met   ASSESSMENT:  CLINICAL IMPRESSION: RECERT TODAY. Patient is a 74 y.o. M who was  seen today for treatment of swallowing and/or speech and language in light of PD. See treatment date above for today's date for further details on today's session. He continues to show progress on all LTGs - d/c next visit. On date of eval he demonstrated incr'd volume and intent with short conversation today proving to be a good candidate for skilled ST. Wife states pt's volume is such that she has to ask him to repeat more often than she had to 5-6 years ago however now Dain reports she does not ask him to repeat.   OBJECTIVE IMPAIRMENTS: Objective impairments include dysarthria and dysphagia. These impairments are limiting patient from household responsibilities, ADLs/IADLs, effectively communicating at home and in community, and safety when swallowing.Factors affecting potential to achieve goals and functional outcome are none noted today. Patient will benefit from skilled SLP services to address above impairments and improve overall function.  REHAB POTENTIAL: Good  PLAN: discharge today  PLANNED INTERVENTIONS: Aspiration precaution training, Pharyngeal strengthening exercises, Diet toleration management , Cueing hierachy, Internal/external aids, Oral motor exercises, Functional tasks, SLP instruction and feedback, Compensatory strategies, Patient/family education, [phone] Treatment of speech (30 or 45 min) , and [id number] Treatment of swallowing function    Mcarthur Ivins, CCC-SLP 03/25/2024, 2:47 PM      "

## 2024-05-13 ENCOUNTER — Ambulatory Visit: Admitting: Cardiovascular Disease

## 2024-05-18 ENCOUNTER — Ambulatory Visit: Admitting: Neurology

## 2024-12-09 ENCOUNTER — Ambulatory Visit: Admitting: Occupational Therapy

## 2024-12-09 ENCOUNTER — Ambulatory Visit

## 2024-12-09 ENCOUNTER — Ambulatory Visit: Admitting: Physical Therapy
